# Patient Record
Sex: Male | Born: 1940 | Race: White | Hispanic: No | Marital: Married | Smoking: Former smoker
Health system: Southern US, Community
[De-identification: ages and names within clinical notes are randomized; demographics above are authoritative.]

## PROBLEM LIST (undated history)

## (undated) DIAGNOSIS — E785 Hyperlipidemia, unspecified: Secondary | ICD-10-CM

## (undated) DIAGNOSIS — K56600 Partial intestinal obstruction, unspecified as to cause: Secondary | ICD-10-CM

## (undated) DIAGNOSIS — U071 COVID-19: Secondary | ICD-10-CM

## (undated) DIAGNOSIS — M1990 Unspecified osteoarthritis, unspecified site: Secondary | ICD-10-CM

## (undated) DIAGNOSIS — I2510 Atherosclerotic heart disease of native coronary artery without angina pectoris: Secondary | ICD-10-CM

## (undated) DIAGNOSIS — I4891 Unspecified atrial fibrillation: Secondary | ICD-10-CM

## (undated) DIAGNOSIS — M545 Low back pain, unspecified: Secondary | ICD-10-CM

## (undated) DIAGNOSIS — M1A9XX0 Chronic gout, unspecified, without tophus (tophi): Secondary | ICD-10-CM

## (undated) DIAGNOSIS — K56609 Unspecified intestinal obstruction, unspecified as to partial versus complete obstruction: Secondary | ICD-10-CM

## (undated) DIAGNOSIS — I10 Essential (primary) hypertension: Secondary | ICD-10-CM

## (undated) DIAGNOSIS — N390 Urinary tract infection, site not specified: Secondary | ICD-10-CM

## (undated) DIAGNOSIS — K449 Diaphragmatic hernia without obstruction or gangrene: Secondary | ICD-10-CM

## (undated) DIAGNOSIS — I441 Atrioventricular block, second degree: Secondary | ICD-10-CM

## (undated) DIAGNOSIS — I4510 Unspecified right bundle-branch block: Secondary | ICD-10-CM

## (undated) HISTORY — DX: Unspecified right bundle-branch block: I45.10

## (undated) HISTORY — DX: Unspecified atrial fibrillation: I48.91

## (undated) HISTORY — DX: Unspecified osteoarthritis, unspecified site: M19.90

## (undated) HISTORY — DX: Chronic gout, unspecified, without tophus (tophi): M1A.9XX0

## (undated) HISTORY — DX: Low back pain, unspecified: M54.50

## (undated) HISTORY — DX: Unspecified intestinal obstruction, unspecified as to partial versus complete obstruction: K56.609

## (undated) HISTORY — DX: Essential (primary) hypertension: I10

## (undated) HISTORY — DX: Atherosclerotic heart disease of native coronary artery without angina pectoris: I25.10

## (undated) HISTORY — DX: Diaphragmatic hernia without obstruction or gangrene: K44.9

## (undated) HISTORY — DX: Atrioventricular block, second degree: I44.1

## (undated) HISTORY — DX: Partial intestinal obstruction, unspecified as to cause: K56.600

## (undated) HISTORY — PX: ROTATOR CUFF REPAIR: SHX139

## (undated) HISTORY — DX: Low back pain: M54.5

## (undated) HISTORY — DX: Urinary tract infection, site not specified: N39.0

## (undated) HISTORY — PX: HERNIA REPAIR: SHX51

## (undated) HISTORY — PX: CARDIAC CATHETERIZATION: SHX172

## (undated) HISTORY — DX: Hyperlipidemia, unspecified: E78.5

## (undated) NOTE — *Deleted (*Deleted)
Pt appears to be displaying symptoms of withdrawal. Notified Dr. Rachael Darby who initiated CIWA protocol. Pt has a history of heavy alcohol. Spoke with pt son who also mentioned the pt takes 1mg  of xanax per day at home. Will notify

---

## 2005-11-01 ENCOUNTER — Encounter: Admission: RE | Admit: 2005-11-01 | Discharge: 2005-11-01 | Payer: Self-pay | Admitting: Sports Medicine

## 2007-12-24 ENCOUNTER — Ambulatory Visit: Payer: Self-pay | Admitting: Oncology

## 2007-12-26 DIAGNOSIS — I2510 Atherosclerotic heart disease of native coronary artery without angina pectoris: Secondary | ICD-10-CM | POA: Insufficient documentation

## 2007-12-26 HISTORY — DX: Atherosclerotic heart disease of native coronary artery without angina pectoris: I25.10

## 2008-01-08 ENCOUNTER — Ambulatory Visit (HOSPITAL_COMMUNITY): Admission: RE | Admit: 2008-01-08 | Discharge: 2008-01-08 | Payer: Self-pay | Admitting: Cardiology

## 2008-01-20 ENCOUNTER — Inpatient Hospital Stay (HOSPITAL_COMMUNITY): Admission: AD | Admit: 2008-01-20 | Discharge: 2008-01-21 | Payer: Self-pay | Admitting: Cardiology

## 2008-10-27 ENCOUNTER — Ambulatory Visit: Payer: Self-pay | Admitting: Oncology

## 2008-11-04 LAB — CBC WITH DIFFERENTIAL/PLATELET
BASO%: 0.4 % (ref 0.0–2.0)
Basophils Absolute: 0 10*3/uL (ref 0.0–0.1)
EOS%: 2.2 % (ref 0.0–7.0)
HGB: 13.8 g/dL (ref 13.0–17.1)
MCH: 34.5 pg — ABNORMAL HIGH (ref 27.2–33.4)
MCHC: 34.9 g/dL (ref 32.0–36.0)
RDW: 12.3 % (ref 11.0–14.6)
WBC: 7.8 10*3/uL (ref 4.0–10.3)
lymph#: 1.4 10*3/uL (ref 0.9–3.3)

## 2008-11-04 LAB — COMPREHENSIVE METABOLIC PANEL
BUN: 17 mg/dL (ref 6–23)
CO2: 27 mEq/L (ref 19–32)
Calcium: 9 mg/dL (ref 8.4–10.5)
Chloride: 106 mEq/L (ref 96–112)
Creatinine, Ser: 1.54 mg/dL — ABNORMAL HIGH (ref 0.40–1.50)
Glucose, Bld: 145 mg/dL — ABNORMAL HIGH (ref 70–99)
Potassium: 4 mEq/L (ref 3.5–5.3)
Sodium: 139 mEq/L (ref 135–145)
Total Bilirubin: 0.6 mg/dL (ref 0.3–1.2)

## 2008-11-10 LAB — HEMOCHROMATOSIS DNA-PCR(C282Y,H63D)

## 2008-11-10 LAB — FERRITIN: Ferritin: 246 ng/mL (ref 22–322)

## 2008-11-10 LAB — AFP TUMOR MARKER: AFP-Tumor Marker: 2.9 ng/mL (ref 0.0–8.0)

## 2008-11-10 LAB — IRON AND TIBC
%SAT: 15 % — ABNORMAL LOW (ref 20–55)
TIBC: 303 ug/dL (ref 215–435)

## 2010-02-15 ENCOUNTER — Encounter: Payer: Self-pay | Admitting: Emergency Medicine

## 2010-02-15 ENCOUNTER — Ambulatory Visit: Payer: Self-pay | Admitting: Diagnostic Radiology

## 2010-02-15 ENCOUNTER — Inpatient Hospital Stay (HOSPITAL_COMMUNITY): Admission: EM | Admit: 2010-02-15 | Discharge: 2010-02-18 | Payer: Self-pay | Admitting: Internal Medicine

## 2010-07-19 ENCOUNTER — Ambulatory Visit: Payer: Self-pay | Admitting: Cardiology

## 2010-09-17 ENCOUNTER — Encounter: Payer: Self-pay | Admitting: Cardiology

## 2010-11-12 LAB — URINALYSIS, ROUTINE W REFLEX MICROSCOPIC
Nitrite: NEGATIVE
Specific Gravity, Urine: 1.035 — ABNORMAL HIGH (ref 1.005–1.030)
Urobilinogen, UA: 2 mg/dL — ABNORMAL HIGH (ref 0.0–1.0)

## 2010-11-12 LAB — BASIC METABOLIC PANEL
BUN: 10 mg/dL (ref 6–23)
Calcium: 8.3 mg/dL — ABNORMAL LOW (ref 8.4–10.5)
Chloride: 102 mEq/L (ref 96–112)
Chloride: 107 mEq/L (ref 96–112)
Creatinine, Ser: 1.07 mg/dL (ref 0.4–1.5)
Creatinine, Ser: 1.29 mg/dL (ref 0.4–1.5)
GFR calc Af Amer: >60 mL/min (ref >60)
GFR calc non Af Amer: 55 mL/min — ABNORMAL LOW (ref >60)
GFR calc non Af Amer: >60 mL/min (ref >60)
Glucose, Bld: 145 mg/dL — ABNORMAL HIGH (ref 70–99)
Glucose, Bld: 83 mg/dL (ref 70–99)
Potassium: 3.9 mEq/L (ref 3.5–5.1)
Sodium: 138 mEq/L (ref 135–145)

## 2010-11-12 LAB — COMPREHENSIVE METABOLIC PANEL
AST: 31 U/L (ref 0–37)
Albumin: 3.6 g/dL (ref 3.5–5.2)
BUN: 13 mg/dL (ref 6–23)
BUN: 17 mg/dL (ref 6–23)
CO2: 27 mEq/L (ref 19–32)
Calcium: 10 mg/dL (ref 8.4–10.5)
Chloride: 101 mEq/L (ref 96–112)
Chloride: 104 mEq/L (ref 96–112)
Creatinine, Ser: 1.2 mg/dL (ref 0.4–1.5)
Creatinine, Ser: 1.22 mg/dL (ref 0.4–1.5)
GFR calc Af Amer: >60 mL/min (ref >60)
GFR calc Af Amer: >60 mL/min (ref >60)
Glucose, Bld: 101 mg/dL — ABNORMAL HIGH (ref 70–99)
Glucose, Bld: 121 mg/dL — ABNORMAL HIGH (ref 70–99)
Potassium: 3.6 mEq/L (ref 3.5–5.1)
Potassium: 4.4 mEq/L (ref 3.5–5.1)
Total Bilirubin: 1.2 mg/dL (ref 0.3–1.2)
Total Protein: 7.6 g/dL (ref 6.0–8.3)

## 2010-11-12 LAB — CBC
HCT: 35.5 % — ABNORMAL LOW (ref 39.0–52.0)
HCT: 43.5 % (ref 39.0–52.0)
Hemoglobin: 14.9 g/dL (ref 13.0–17.0)
MCH: 33.8 pg (ref 26.0–34.0)
MCHC: 34.1 g/dL (ref 30.0–36.0)
MCV: 99.1 fL (ref 78.0–100.0)
Platelets: 188 10*3/uL (ref 150–400)
RBC: 3.53 MIL/uL — ABNORMAL LOW (ref 4.22–5.81)
RBC: 3.76 MIL/uL — ABNORMAL LOW (ref 4.22–5.81)
RDW: 12.6 % (ref 11.5–15.5)
RDW: 13.3 % (ref 11.5–15.5)
RDW: 13.4 % (ref 11.5–15.5)
WBC: 5.8 10*3/uL (ref 4.0–10.5)
WBC: 9.9 10*3/uL (ref 4.0–10.5)

## 2010-11-12 LAB — LIPASE, BLOOD: Lipase: 135 U/L (ref 23–300)

## 2010-11-12 LAB — DIFFERENTIAL
Basophils Absolute: 0.1 10*3/uL (ref 0.0–0.1)
Basophils Relative: 1 % (ref 0–1)
Eosinophils Relative: 3 % (ref 0–5)
Lymphs Abs: 1.6 10*3/uL (ref 0.7–4.0)
Monocytes Relative: 11 % (ref 3–12)
Neutro Abs: 8.8 10*3/uL — ABNORMAL HIGH (ref 1.7–7.7)
Neutrophils Relative %: 73 % (ref 43–77)

## 2010-11-12 LAB — CARDIAC PANEL(CRET KIN+CKTOT+MB+TROPI)
Relative Index: INVALID (ref 0.0–2.5)
Troponin I: 0.01 ng/mL (ref 0.00–0.06)

## 2010-11-12 LAB — URINE MICROSCOPIC-ADD ON

## 2010-11-12 LAB — URINE CULTURE: Colony Count: 45000

## 2010-11-23 ENCOUNTER — Telehealth: Payer: Self-pay | Admitting: Cardiology

## 2010-11-23 NOTE — Telephone Encounter (Signed)
FYI... Patient wife called and asked about return appt.  cked and pt not due to come back until may 23,2012.  She sounded as if she needed a sooner appt.  Asked if patient is having any problems.  She said he has been getting very SOB and having some pain in his chest.  Offered them to come in and see Lawson Fiscal.  She said pt would want to see Dr. [place].  Advised I did not have an opening for awhile, but if urgent we could see patient with another doc or NP.  She said she would call back after she talked to patient.

## 2010-11-24 NOTE — Telephone Encounter (Signed)
Wife called yest asking about return app. Advised he is sch for May. States mr. Crill has been having some SOB and occ pain in chest. Offered app w/ Lawson Fiscal but insisted seeing Dr. [place]. Said she would talk w/ pt and call back. Tried calling this AM since not hearing back from her yest. LM

## 2010-11-29 ENCOUNTER — Telehealth: Payer: Self-pay | Admitting: *Deleted

## 2010-11-29 NOTE — Telephone Encounter (Signed)
Have left several messages for wife but has not returned call

## 2010-12-27 ENCOUNTER — Encounter: Payer: Self-pay | Admitting: Cardiology

## 2010-12-27 DIAGNOSIS — K56609 Unspecified intestinal obstruction, unspecified as to partial versus complete obstruction: Secondary | ICD-10-CM | POA: Insufficient documentation

## 2010-12-27 DIAGNOSIS — E785 Hyperlipidemia, unspecified: Secondary | ICD-10-CM | POA: Insufficient documentation

## 2010-12-27 DIAGNOSIS — M109 Gout, unspecified: Secondary | ICD-10-CM | POA: Insufficient documentation

## 2010-12-27 DIAGNOSIS — M545 Low back pain, unspecified: Secondary | ICD-10-CM | POA: Insufficient documentation

## 2010-12-27 DIAGNOSIS — M1990 Unspecified osteoarthritis, unspecified site: Secondary | ICD-10-CM | POA: Insufficient documentation

## 2010-12-27 DIAGNOSIS — I10 Essential (primary) hypertension: Secondary | ICD-10-CM | POA: Insufficient documentation

## 2011-01-01 ENCOUNTER — Other Ambulatory Visit: Payer: Self-pay | Admitting: *Deleted

## 2011-01-03 ENCOUNTER — Ambulatory Visit (INDEPENDENT_AMBULATORY_CARE_PROVIDER_SITE_OTHER): Payer: Medicare Other | Admitting: Cardiology

## 2011-01-03 ENCOUNTER — Encounter: Payer: Self-pay | Admitting: Cardiology

## 2011-01-03 ENCOUNTER — Other Ambulatory Visit (INDEPENDENT_AMBULATORY_CARE_PROVIDER_SITE_OTHER): Payer: Medicare Other | Admitting: *Deleted

## 2011-01-03 VITALS — BP 126/90 | HR 62 | Ht 71.0 in | Wt 232.0 lb

## 2011-01-03 DIAGNOSIS — M109 Gout, unspecified: Secondary | ICD-10-CM

## 2011-01-03 DIAGNOSIS — I4510 Unspecified right bundle-branch block: Secondary | ICD-10-CM

## 2011-01-03 DIAGNOSIS — I2510 Atherosclerotic heart disease of native coronary artery without angina pectoris: Secondary | ICD-10-CM

## 2011-01-03 DIAGNOSIS — I10 Essential (primary) hypertension: Secondary | ICD-10-CM

## 2011-01-03 DIAGNOSIS — E785 Hyperlipidemia, unspecified: Secondary | ICD-10-CM

## 2011-01-03 MED ORDER — INDOMETHACIN ER 75 MG PO CPCR: 75.0000 mg | ORAL_CAPSULE | Freq: Two times a day (BID) | ORAL | Status: AC

## 2011-01-03 NOTE — Assessment & Plan Note (Signed)
This was noted at the time of his stress test in June of 2011. He is asymptomatic.

## 2011-01-03 NOTE — Assessment & Plan Note (Signed)
I recommended long-term dual antiplatelet therapy given the extensive stenting of his LAD with first generation drug-coated stents. I recommended weight loss. Continue risk factor modification. His last nuclear stress test in June 2011 showed a very small infarct and apical wall. Otherwise perfusion was normal and his ejection fraction was normal.

## 2011-01-03 NOTE — Progress Notes (Signed)
   [name] Date of Birth: 10-20-1940   History of Present Illness: Mr. Rebstock is seen today for six-month followup. He has had no significant chest pain. One month ago he had a lot of chest congestion which he attributed to the pollen. He did have some chest tightness at that time. The symptoms have since cleared up. He still notes occasional shortness of breath. He has had no palpitations.  Current Outpatient Prescriptions on File Prior to Visit  Medication Sig Dispense Refill  . clopidogrel (PLAVIX) 75 MG tablet Take 75 mg by mouth daily.        . simvastatin (ZOCOR) 40 MG tablet Take 40 mg by mouth at bedtime.        . verapamil (CALAN) 120 MG tablet Take 120 mg by mouth daily.        Marland Kitchen DISCONTD: aspirin 325 MG tablet Take 325 mg by mouth daily.          No Known Allergies  Past Medical History  Diagnosis Date  . Coronary artery disease 12/2007    STATUS POST STENTING OF THE LEFT ANTERIOR DESCENDING ARTERY  . Hypertension   . Hyperlipidemia   . SBO (small bowel obstruction)     PARTIAL  . LBP (low back pain)   . Arthritis   . Gout     Past Surgical History  Procedure Date  . Hernia repair     INGUINAL    History  Smoking status  . Former Smoker  . Quit date: 08/27/1990  Smokeless tobacco  . Not on file    History  Alcohol Use No    Family History  Problem Relation Age of Onset  . Emphysema Mother 33  . Heart attack Father 43    Review of Systems: The review of systems is positive for seasonal allergies.  All other systems were reviewed and are negative.  Physical Exam: BP 126/90  Pulse 62  Ht 5\' 11"  (1.803 m)  Wt 232 lb (105.235 kg)  BMI 32.36 kg/m2 He is an overweight white male in no acute distress. He has no JVD or bruits. HEENT exam is unremarkable. Lungs are clear. Cardiac exam reveals a regular rate and rhythm without gallop or murmur. Abdomen is obese, soft, nontender. He has no edema. LABORATORY DATA: ECG today demonstrates a normal  sinus rhythm with a right bundle branch block.  Assessment / Plan:

## 2011-01-03 NOTE — Assessment & Plan Note (Signed)
Continue simvastatin therapy. We will check fasting lab work on his followup in 6 months.

## 2011-01-03 NOTE — Assessment & Plan Note (Signed)
Blood pressure control is acceptable. We will continue with his verapamil.

## 2011-01-03 NOTE — Patient Instructions (Signed)
Stay on the same medications.  We will check your fasting lab work in 6 months with your next office visit.  Drink less than 2 alcoholic beverages per day. 1-1.5 ounces.

## 2011-01-09 NOTE — Discharge Summary (Signed)
Gregory Wheeler, Gregory Wheeler NO.:  [id number]   MEDICAL RECORD NO.:  [id number]          PATIENT TYPE:  INP   LOCATION:  6527                         FACILITY:  [facility]   PHYSICIAN:  Peter M. Swaziland, M.D.  DATE OF BIRTH:  [birth year]   DATE OF ADMISSION:  01/20/2008  DATE OF DISCHARGE:  01/21/2008                               DISCHARGE SUMMARY   INDICATION:  Admission.   HISTORY OF PRESENT ILLNESS:  Gregory Wheeler is a 70 year old white male who  presented with symptoms of dyspnea.  He had some vague atypical chest  pain.  He had a history of hypertension and hypercholesterolemia.  He  subsequently underwent a stress Cardiolite study.  The patient's study  was equivocal.  He was able to complete stage II of the Bruce protocol  without significant chest pain or ECG changes.  His Cardiolite images  demonstrated a fixed apical defect with an ejection fraction normal at  65%.  The patient subsequently underwent a CT angiography, which  demonstrated severe noncalcific plaque in the mid LAD consistent with  moderate-to-severe stenosis.  Based on these findings, the patient was  admitted for cardiac catheterization, possible intervention.   For details of his past medical history, social history, family history,  and physical exam please see admission history and physical.   HOSPITAL COURSE:  The patient was brought to the cardiac catheterization  laboratory.  Coronary angiography demonstrated severe complex stenosis  in the mid LAD.  There is an 80-90% stenosis at the takeoff of small  second and third diagonal branches as well as first septal perforator.  This was followed in the next segment of the mid LAD with a focal 80-90%  stenosis.  The left circumflex coronary and the right coronary without  significant disease.  Left ventricular function was again normal with  65% ejection fraction.  We proceeded at this point with an intracoronary  intervention of the mid LAD.  We used  two sequential stents to cover  this entire lesion.  The more distal portion of the lesion was covered  with a 2.75 x 16-mm Taxus Liberte stent.  The more proximal portion of  the lesion was covered with a 3.0 x 16-mm Taxus Liberte stent.  The more  proximal portion was postdilated up to 3.25 mm with a noncompliant  balloon.  The patient achieved an excellent angiographic result,  0%  residual stenosis, and TIMI grade 3 flow.  There was no significant  compromise of the diagonal or septal perforator side branches.  The  patient had no angina.  He received Angiomax during the procedure was  discontinued and the procedure.  He was maintained on aspirin and Plavix  and subsequent ECG was normal.  Cardiac enzymes were normal.  Following  morning, his CBC and BMET were normal.  Creatinine of 1.3 and good urine  output.  Vital signs remained stable.  He was discharged home the  following day in stable condition.   DISCHARGE DIAGNOSES:  1. Severe single-vessel obstructive coronary disease.  2. Successful intracoronary stenting of the mid left anterior  descending.  3. Hypertension.  4. Hyperlipidemia.   DISCHARGE MEDICATIONS:  1. Coated aspirin 325 mg per day.  2. Plavix 75 mg per day.  3. Zocor 40 mg per day.  4. Verapamil 120 mg per day.  5. Voltaren 75 mg daily p.r.n.  6. Nitroglycerin p.r.n.   The patient is to avoid heavy lifting or straining for 5 days.  He will  remain on a heart-healthy diet and will follow up with Dr. Swaziland in  approximately 1 week.  Discharge status is improved.           ______________________________  Peter M. Sw[place]iland, M.D.     PMJ/MEDQ  D:  01/21/2008  T:  01/21/2008  Job:  [id number]   cc:   Evelena Peat, M.D.

## 2011-01-09 NOTE — H&P (Signed)
NAMEJERALD, Wheeler NO.:  [id number]   MEDICAL RECORD NO.:  [id number]           PATIENT TYPE:   LOCATION:                                 FACILITY:   PHYSICIAN:  Peter M. [place]aziland, M.D.  DATE OF BIRTH:  26-May-1941   DATE OF ADMISSION:  DATE OF DISCHARGE:                              HISTORY & PHYSICAL   HISTORY OF PRESENT ILLNESS:  [name] is a pleasant 70 year old white  male who is seen for evaluation on dyspnea.  The patient had complained  of shortness of breath, mostly with exertion.  He has had some burning,  left upper chest pain as well.  These symptoms have actually been  present for a couple of years but seemed to have progressed more  recently.  He has had a mild cough as well.  The patient has risk  factors of hypertension and hypercholesterolemia.  He also has a family  history of early coronary disease.  To evaluate his symptoms, he  underwent a stress Cardiolite study on Dec 26, 2007.  The patient was  able to walk for 5 minutes and 40 seconds without chest pain or ECG  changes.  His Cardiolite images demonstrated a fixed apical defect.  His  ejection fraction is normal at 65%.  This was felt to be an equivocal  study; and for this reason, he underwent a cardiac CT angiography on Jan 08, 2008.  This demonstrated scattered nonobstructive calcific plaque.  However, in the mid LAD, there was a noncalcific plaque that was  moderate to severe with what was felt to be at least moderate  obstructive disease.  Again, his ejection fraction was normal.  His  calcium score was 261, which placed him in the 50-75th percentile for  his matched age and sex.  There was no aortic pathology and no pulmonary  pathology.  He was noted to have a moderate-sized hiatal hernia.  Based  on these findings, it is recommended that he undergo a coronary  angiography for definitive evaluation of his mid LAD stenosis; and if  this is moderately severe, then consider for  stenting.   PAST MEDICAL HISTORY:  1. Hypertension.  2. Hypercholesterolemia.  3. Arthritis.  4. He has had previous inguinal hernia repair at age 57.  5. He has previously been hospitalized for gout.   ALLERGIES:  He has no known allergies.   CURRENT MEDICATIONS:  Include:  1. Verapamil 120 mg per day.  2. Simvastatin 40 mg per day.  3. Diclofenac 75 mg per day.  4. Aspirin daily.   SOCIAL HISTORY:  The patient is married.  He has 3 children in good  health.  He is self-employed, used [employer].  He denies tobacco use.  He does drink 3-4 alcoholic beverages per day.   FAMILY HISTORY:  Father died at age 36 with a myocardial infarction.  He  had had his first heart attack at age 82.  Mother died at age 16 with  emphysema.  One brother age 41 and six sisters are all in good health.  REVIEW OF SYSTEMS:  As noted in the HPI, otherwise unremarkable.   PHYSICAL EXAMINATION:  GENERAL:  The patient is a pleasant white male in  no apparent distress.  His weight is 232, blood pressure is 128/80,  pulse 68 and regular.  HEENT:  Normocephalic and atraumatic.  His pupils are equal, round, and  reactive to light and accommodation.  Extraocular movements are full.  Oropharynx is clear.  NECK:  Supple without JVD, adenopathy, thyromegaly, or bruits.  LUNGS:  Clear to auscultation and percussion.  CARDIAC:  Reveals a regular rate and rhythm.  Normal S1 and S2 without  gallop, murmur, rub, or click.  ABDOMEN:  Soft and nontender.  He has no masses or hepatosplenomegaly.  He is obese.  EXTREMITIES:  Femoral pedal pulses 2+ and symmetric.  He has no edema.  NEUROLOGIC:  Nonfocal.   LABORATORY DATA:  His chest x-ray shows no active disease.  His resting  ECG shows normal sinus rhythm with right bundle branch block pattern.   IMPRESSION:  1. Symptoms of dyspnea on exertion and atypical chest pain.  Stress      Cardiolite study was equivocal showing a fixed apical defect.  2. Cardiac CT  angiography demonstrates calcific and noncalcific plaque      with moderate-to-severe obstruction in the mid left anterior      descending.  3. Hypertension.  4. Hypercholesterolemia.  5. Alcohol use.   PLAN:  We will proceed with diagnostic cardiac catheterization.  This  confirms significant stenosis in the mid LAD.  We will proceed with  stenting.           ______________________________  Peter M. [place]iland, M.D.     PMJ/MEDQ  D:  01/12/2008  T:  01/13/2008  Job:  [id number]   cc:   Evelena Peat, M.D.

## 2011-01-25 ENCOUNTER — Other Ambulatory Visit: Payer: Self-pay | Admitting: Cardiology

## 2011-01-25 NOTE — Telephone Encounter (Signed)
escribe medication per fax request  

## 2011-02-05 ENCOUNTER — Other Ambulatory Visit: Payer: Self-pay | Admitting: Cardiology

## 2011-02-05 NOTE — Telephone Encounter (Signed)
Med refill

## 2011-05-23 LAB — CBC
HCT: 41.1
MCHC: 34
Platelets: 229
RDW: 12

## 2011-05-23 LAB — CK TOTAL AND CKMB (NOT AT ARMC): CK, MB: 1.3

## 2011-05-23 LAB — BASIC METABOLIC PANEL
BUN: 13
CO2: 26
Calcium: 9.6
Glucose, Bld: 87
Potassium: 4.5
Sodium: 141

## 2011-07-03 ENCOUNTER — Other Ambulatory Visit: Payer: Self-pay | Admitting: Cardiology

## 2011-07-16 ENCOUNTER — Ambulatory Visit (INDEPENDENT_AMBULATORY_CARE_PROVIDER_SITE_OTHER): Payer: Medicare Other | Admitting: Cardiology

## 2011-07-16 ENCOUNTER — Encounter: Payer: Self-pay | Admitting: Cardiology

## 2011-07-16 VITALS — BP 149/89 | HR 79 | Ht 71.0 in | Wt 233.0 lb

## 2011-07-16 DIAGNOSIS — I10 Essential (primary) hypertension: Secondary | ICD-10-CM

## 2011-07-16 DIAGNOSIS — E785 Hyperlipidemia, unspecified: Secondary | ICD-10-CM

## 2011-07-16 DIAGNOSIS — I2510 Atherosclerotic heart disease of native coronary artery without angina pectoris: Secondary | ICD-10-CM

## 2011-07-16 DIAGNOSIS — Z9911 Dependence on respirator [ventilator] status: Secondary | ICD-10-CM

## 2011-07-16 LAB — HEPATIC FUNCTION PANEL
ALT: 17 U/L (ref 0–53)
AST: 17 U/L (ref 0–37)
Alkaline Phosphatase: 59 U/L (ref 39–117)
Bilirubin, Direct: 0.1 mg/dL (ref 0.0–0.3)
Total Bilirubin: 0.8 mg/dL (ref 0.3–1.2)

## 2011-07-16 LAB — BASIC METABOLIC PANEL
BUN: 13 mg/dL (ref 6–23)
Calcium: 9.6 mg/dL (ref 8.4–10.5)
Creatinine, Ser: 1.3 mg/dL (ref 0.4–1.5)
GFR: 60.19 mL/min (ref >60.00)
Glucose, Bld: 92 mg/dL (ref 70–99)
Sodium: 140 mEq/L (ref 135–145)

## 2011-07-16 LAB — LIPID PANEL: HDL: 46.6 mg/dL (ref >39.00)

## 2011-07-16 NOTE — Assessment & Plan Note (Signed)
He remains asymptomatic. We'll continue with risk factor modification. He will continue with dual antiplatelet therapy. His last stress test in 2011 was acceptable. I will followup again in 6 months.

## 2011-07-16 NOTE — Patient Instructions (Signed)
Continue your current medications.  Watch your blood pressure at home.  We will check blood work today and call with the results.  I will see you again in 6 months.

## 2011-07-16 NOTE — Assessment & Plan Note (Signed)
Blood pressure is mildly elevated today but his previous visits and blood pressure readings at home have been good. We will ask him to continue to monitor.

## 2011-07-16 NOTE — Assessment & Plan Note (Signed)
We will followup on fasting lab work today. Encouraged him to lose weight.

## 2011-07-16 NOTE — Progress Notes (Signed)
   [name] Date of Birth: September 05, 1940   History of Present Illness: [name] is seen today for six-month followup. He has had no significant chest pain.  He still notes occasional shortness of breath. He has had no palpitations. Blood pressure readings at home have been normal.   Current Outpatient Prescriptions on File Prior to Visit  Medication Sig Dispense Refill  . aspirin 81 MG tablet Take 81 mg by mouth daily.        Marland Kitchen PLAVIX 75 MG tablet TAKE ONE TABLET BY MOUTH EVERY DAY  30 each  5  . simvastatin (ZOCOR) 40 MG tablet Take 40 mg by mouth at bedtime.        . verapamil (CALAN) 120 MG tablet TAKE ONE TABLET BY MOUTH EVERY DAY  30 tablet  4    No Known Allergies  Past Medical History  Diagnosis Date  . Coronary artery disease 12/2007    STATUS POST STENTING OF THE LEFT ANTERIOR DESCENDING ARTERY  . Hypertension   . Hyperlipidemia   . SBO (small bowel obstruction)     PARTIAL  . LBP (low back pain)   . Arthritis   . Gout   . RBBB (right bundle branch block)   . Partial small bowel obstruction   . Hiatal hernia   . Chronic gout   . Urinary tract infection     Escherichia coli urinary tract infection  . Dyslipidemia   . Abdominal pain   . Nausea   . Vomiting     Past Surgical History  Procedure Date  . Hernia repair     INGUINAL  . Rotator cuff repair     History  Smoking status  . Former Smoker  . Quit date: 08/27/1990  Smokeless tobacco  . Not on file    History  Alcohol Use No    Family History  Problem Relation Age of Onset  . Emphysema Mother 28  . Heart attack Father 73    Review of Systems: As noted in HPI All other systems were reviewed and are negative.  Physical Exam: BP 149/89  Pulse 79  Ht 5\' 11"  (1.803 m)  Wt 233 lb (105.688 kg)  BMI 32.50 kg/m2 He is an overweight white male in no acute distress. He has no JVD or bruits. HEENT exam is unremarkable. Lungs are clear. Cardiac exam reveals a regular rate and rhythm without  gallop or murmur. Abdomen is obese, soft, nontender. He has no edema. LABORATORY DATA:  Assessment / Plan:

## 2011-08-04 ENCOUNTER — Other Ambulatory Visit: Payer: Self-pay | Admitting: Cardiology

## 2011-08-23 ENCOUNTER — Other Ambulatory Visit: Payer: Self-pay | Admitting: Cardiology

## 2011-09-05 ENCOUNTER — Telehealth: Payer: Self-pay | Admitting: Cardiology

## 2011-09-05 MED ORDER — NITROGLYCERIN 0.4 MG SL SUBL: 0.4000 mg | SUBLINGUAL_TABLET | SUBLINGUAL | Status: DC | PRN

## 2011-09-05 NOTE — Telephone Encounter (Signed)
Patient's wife called,stating patient has been having pain lf chest pain off and on since yesterday 09/04/11.States does not have any NTG.Spoke with [name] Fredrickson NP.She advised NTG prn and make appointment this week.Appointment scheduled with Lawson Fiscal 09/06/11 at 8:45 am.NTG prescription sent to [facility].

## 2011-09-05 NOTE — Telephone Encounter (Signed)
New problem:  Patient wife calling C/O pain under left breast. No nitro given.

## 2011-09-06 ENCOUNTER — Telehealth: Payer: Self-pay | Admitting: Cardiology

## 2011-09-06 ENCOUNTER — Ambulatory Visit: Payer: Medicare Other | Admitting: Nurse Practitioner

## 2011-09-06 NOTE — Telephone Encounter (Signed)
Patient's wife was called, she stated patient had to go out of town,that is why he cancelled appointment today.Advised if chest pain continues he needs to make appointment or go to ER.Wife stated patient has not had any chest pain today. She will call back if chest pain continues.

## 2011-09-06 NOTE — Telephone Encounter (Signed)
FU Call: pt wife returning call from [place]. Please return pt call to discuss further.

## 2011-12-07 ENCOUNTER — Other Ambulatory Visit: Payer: Self-pay | Admitting: Cardiology

## 2012-01-28 ENCOUNTER — Ambulatory Visit (INDEPENDENT_AMBULATORY_CARE_PROVIDER_SITE_OTHER): Payer: Medicare Other | Admitting: Cardiology

## 2012-01-28 ENCOUNTER — Encounter: Payer: Self-pay | Admitting: Cardiology

## 2012-01-28 VITALS — BP 148/88 | HR 77 | Ht 71.0 in | Wt 235.0 lb

## 2012-01-28 DIAGNOSIS — E785 Hyperlipidemia, unspecified: Secondary | ICD-10-CM

## 2012-01-28 DIAGNOSIS — I2510 Atherosclerotic heart disease of native coronary artery without angina pectoris: Secondary | ICD-10-CM

## 2012-01-28 DIAGNOSIS — I10 Essential (primary) hypertension: Secondary | ICD-10-CM

## 2012-01-28 NOTE — Progress Notes (Signed)
   Gregory Wheeler Date of Birth: 04/11/1941   History of Present Illness: Gregory Wheeler is seen today for a followup visit. He denies any significant chest pain or shortness of breath. He remains very active at home but doesn't really do any form or exercise. He denies any palpitations. He reports his blood pressure reading yesterday was 140/76. He does eat a lot of salt. In January he had a vague discomfort beneath his left breast that he attributed to eating a lot of peanuts. This has not recurred.   Current Outpatient Prescriptions on File Prior to Visit  Medication Sig Dispense Refill  . aspirin 81 MG tablet Take 81 mg by mouth daily.        . clopidogrel (PLAVIX) 75 MG tablet TAKE ONE TABLET BY MOUTH EVERY DAY  30 tablet  5  . nitroGLYCERIN (NITROSTAT) 0.4 MG SL tablet Place 1 tablet (0.4 mg total) under the tongue every 5 (five) minutes as needed for chest pain.  25 tablet  12  . simvastatin (ZOCOR) 40 MG tablet TAKE ONE TABLET BY MOUTH AT BEDTIME  30 tablet  6  . verapamil (CALAN) 120 MG tablet TAKE ONE TABLET BY MOUTH EVERY DAY.  30 tablet  5    No Known Allergies  Past Medical History  Diagnosis Date  . Coronary artery disease 12/2007    STATUS POST STENTING OF THE LEFT ANTERIOR DESCENDING ARTERY  . Hypertension   . Hyperlipidemia   . SBO (small bowel obstruction)     PARTIAL  . LBP (low back pain)   . Arthritis   . Gout   . RBBB (right bundle branch block)   . Partial small bowel obstruction   . Hiatal hernia   . Chronic gout   . Urinary tract infection     Escherichia coli urinary tract infection  . Dyslipidemia   . Abdominal pain   . Nausea   . Vomiting     Past Surgical History  Procedure Date  . Hernia repair     INGUINAL  . Rotator cuff repair     History  Smoking status  . Former Smoker  . Quit date: 08/27/1990  Smokeless tobacco  . Not on file    History  Alcohol Use No    Family History  Problem Relation Age of Onset  . Emphysema Mother 28   . Heart attack Father 45    Review of Systems: As noted in HPI All other systems were reviewed and are negative.  Physical Exam: BP 148/88  Pulse 77  Ht 5\' 11"  (1.803 m)  Wt 235 lb (106.595 kg)  BMI 32.78 kg/m2 He is an overweight white male in no acute distress. He has no JVD or bruits. HEENT exam is unremarkable. PERRLA, sclera are clear. Oropharynx is clear. Lungs are clear. Cardiac exam reveals a regular rate and rhythm without gallop or murmur. Abdomen is obese, soft, nontender. He has no edema. Pedal pulses are 2+ and symmetric. He is alert and oriented x3. Cranial nerves II through XII are intact. LABORATORY DATA: ECG shows normal sinus rhythm with a right bundle branch block. This is old. Assessment / Plan:

## 2012-01-28 NOTE — Patient Instructions (Signed)
Continue your current medication.  Reduce your salt intake and monitor your blood pressure.  I will see you again in 6 months with fasting lab.

## 2012-01-28 NOTE — Assessment & Plan Note (Signed)
Blood pressure is elevated today similar to what we got on his last visit. I told him he could eliminate sodium from his diet or we'll need to put him on additional medication. I asked that he monitor his blood pressure more closely at home.

## 2012-01-28 NOTE — Assessment & Plan Note (Signed)
He has had no significant anginal symptoms. His last nuclear stress test in June of 2011 showed a small apical infarct with normal ejection fraction of 80%. We will continue with his medical therapy and plan on followup again in 6 months. Unless he develops new symptoms we will plan on repeat stress testing in one year.

## 2012-01-28 NOTE — Assessment & Plan Note (Signed)
Laboratory data was reviewed from his prior visit. His lipid levels are excellent. We will continue with his current therapy.

## 2012-02-28 ENCOUNTER — Other Ambulatory Visit: Payer: Self-pay | Admitting: Cardiology

## 2012-03-11 ENCOUNTER — Other Ambulatory Visit: Payer: Self-pay | Admitting: Cardiology

## 2012-05-09 ENCOUNTER — Telehealth: Payer: Self-pay | Admitting: Cardiology

## 2012-05-09 NOTE — Telephone Encounter (Signed)
Spoke to patient's wife was told Dr.Jordan advised to hold zocor and call back in 2 weeks to report condtion,if legs are better he will prescribe another statin.

## 2012-05-09 NOTE — Telephone Encounter (Signed)
Agree with holding Zocor. If pain improves significantly then we could try him on low dose of crestor or lipitor. Have him check back with [place] in a couple of weeks. Katerin Negrete [place] MD, [facility]

## 2012-05-09 NOTE — Telephone Encounter (Signed)
New Problem:    Patient's wife called in because her husband's legs are bothering him and she believes that it is due to his simvastatin (ZOCOR) 40 MG tablet.  Patient would like to know if there were any other alteratives for this medication.  Please call back.

## 2012-05-09 NOTE — Telephone Encounter (Signed)
Patient called spoke to wife she stated patient has been having pain in both legs for over a month,wanting to know if zocor was causing.Advised to hold zocor and will let Dr.Jordan know.Message sent to Dr.Jordan for advice.

## 2012-06-10 ENCOUNTER — Other Ambulatory Visit: Payer: Self-pay | Admitting: Cardiology

## 2012-08-06 ENCOUNTER — Ambulatory Visit (INDEPENDENT_AMBULATORY_CARE_PROVIDER_SITE_OTHER): Payer: Medicare Other | Admitting: Cardiology

## 2012-08-06 ENCOUNTER — Encounter: Payer: Self-pay | Admitting: Cardiology

## 2012-08-06 VITALS — BP 152/90 | HR 75 | Ht 71.0 in | Wt 236.8 lb

## 2012-08-06 DIAGNOSIS — I4510 Unspecified right bundle-branch block: Secondary | ICD-10-CM

## 2012-08-06 DIAGNOSIS — E785 Hyperlipidemia, unspecified: Secondary | ICD-10-CM | POA: Diagnosis not present

## 2012-08-06 DIAGNOSIS — I2510 Atherosclerotic heart disease of native coronary artery without angina pectoris: Secondary | ICD-10-CM

## 2012-08-06 DIAGNOSIS — I10 Essential (primary) hypertension: Secondary | ICD-10-CM | POA: Diagnosis not present

## 2012-08-06 LAB — HEPATIC FUNCTION PANEL
ALT: 21 U/L (ref 0–53)
AST: 20 U/L (ref 0–37)
Bilirubin, Direct: 0.1 mg/dL (ref 0.0–0.3)
Total Bilirubin: 1.1 mg/dL (ref 0.3–1.2)

## 2012-08-06 LAB — LIPID PANEL: Total CHOL/HDL Ratio: 4

## 2012-08-06 LAB — BASIC METABOLIC PANEL
BUN: 12 mg/dL (ref 6–23)
CO2: 29 mEq/L (ref 19–32)
Calcium: 9.5 mg/dL (ref 8.4–10.5)
GFR: 68.74 mL/min (ref >60.00)
Glucose, Bld: 86 mg/dL (ref 70–99)

## 2012-08-06 MED ORDER — INDOMETHACIN 50 MG PO CAPS: 50.0000 mg | ORAL_CAPSULE | Freq: Two times a day (BID) | ORAL | Status: DC

## 2012-08-06 MED ORDER — VERAPAMIL HCL ER 240 MG PO TBCR: 240.0000 mg | EXTENDED_RELEASE_TABLET | Freq: Every day | ORAL | Status: DC

## 2012-08-06 NOTE — Patient Instructions (Signed)
Increase verapamil (Calan) to 240 mg daily  I sent a Rx for indocin to use prn gout.  We will check lab work today and call with the results.  I will see you in 6 months.

## 2012-08-06 NOTE — Progress Notes (Signed)
   Gregory Wheeler Date of Birth: 04/17/1941   History of Present Illness: Gregory Wheeler is seen today for a followup visit. He denies any significant chest pain or shortness of breath. He states he has been doing better with his salt intake. Blood pressure readings at home have been variable. He denies any dizziness.  Current Outpatient Prescriptions on File Prior to Visit  Medication Sig Dispense Refill  . allopurinol (ZYLOPRIM) 100 MG tablet Take 100 mg by mouth as directed.      Marland Kitchen aspirin 81 MG tablet Take 81 mg by mouth daily.        . clopidogrel (PLAVIX) 75 MG tablet TAKE ONE TABLET BY MOUTH EVERY DAY  30 tablet  5  . simvastatin (ZOCOR) 40 MG tablet TAKE ONE TABLET BY MOUTH AT BEDTIME  30 tablet  5  . [DISCONTINUED] verapamil (CALAN) 120 MG tablet TAKE ONE TABLET BY MOUTH EVERY DAY  30 tablet  4  . nitroGLYCERIN (NITROSTAT) 0.4 MG SL tablet Place 1 tablet (0.4 mg total) under the tongue every 5 (five) minutes as needed for chest pain.  25 tablet  12    No Known Allergies  Past Medical History  Diagnosis Date  . Coronary artery disease 12/2007    STATUS POST STENTING OF THE LEFT ANTERIOR DESCENDING ARTERY  . Hypertension   . Hyperlipidemia   . SBO (small bowel obstruction)     PARTIAL  . LBP (low back pain)   . Arthritis   . Gout   . RBBB (right bundle branch block)   . Partial small bowel obstruction   . Hiatal hernia   . Chronic gout   . Urinary tract infection     Escherichia coli urinary tract infection  . Dyslipidemia     Past Surgical History  Procedure Date  . Hernia repair     INGUINAL  . Rotator cuff repair     History  Smoking status  . Former Smoker  . Quit date: 08/27/1990  Smokeless tobacco  . Not on file    History  Alcohol Use No    Family History  Problem Relation Age of Onset  . Emphysema Mother 22  . Heart attack Father 25    Review of Systems: As noted in HPI All other systems were reviewed and are negative.  Physical Exam: BP  152/90  Pulse 75  Ht 5\' 11"  (1.803 m)  Wt 236 lb 12.8 oz (107.412 kg)  BMI 33.03 kg/m2  SpO2 97% He is an overweight white male in no acute distress. He has no JVD or bruits. HEENT exam is unremarkable. PERRLA, sclera are clear. Oropharynx is clear. Lungs are clear. Cardiac exam reveals a regular rate and rhythm without gallop or murmur. Abdomen is obese, soft, nontender. He has no edema. Pedal pulses are 2+ and symmetric. He is alert and oriented x3. Cranial nerves II through XII are intact. LABORATORY DATA:  Assessment / Plan: 1. Coronary disease status post stenting of the LAD in 2009. Nuclear stress test in June of 2011 showed a small apical infarct with normal ejection fraction. Continue with risk factor modification. Continue aspirin and Plavix.  2. Hypertension. Still not adequately controlled. We will increase his verapamil to 240 mg daily.  3. Hyperlipidemia under good control on simvastatin.

## 2012-08-07 MED ORDER — VERAPAMIL HCL ER 240 MG PO TBCR: 240.0000 mg | EXTENDED_RELEASE_TABLET | Freq: Every day | ORAL | Status: DC

## 2012-08-07 NOTE — Progress Notes (Signed)
Calan 120 mg daily # 30 refills x 6 phoned in to [facility].

## 2012-08-07 NOTE — Progress Notes (Signed)
Indomethacin 50mg  prescription phoned in to [facility]

## 2012-08-07 NOTE — Addendum Note (Signed)
Addended by: Meda Klinefelter D on: 08/07/2012 12:00 PM   Modules accepted: Orders

## 2012-08-14 ENCOUNTER — Telehealth: Payer: Self-pay | Admitting: *Deleted

## 2012-08-14 ENCOUNTER — Telehealth: Payer: Self-pay | Admitting: Cardiology

## 2012-08-14 MED ORDER — INDOMETHACIN 50 MG PO CAPS: 50.0000 mg | ORAL_CAPSULE | Freq: Two times a day (BID) | ORAL | Status: DC

## 2012-08-14 NOTE — Telephone Encounter (Signed)
Plz return call to pt wife at hm#[phone]  regarding medication issues

## 2012-08-14 NOTE — Telephone Encounter (Signed)
Called the pt's [facility] spoke with the Pharmacist Alease  she stated it was an error and if the pt hadn't started taking the verapamil (CALAN SR) 120 MG CR tablet they could exchange the Rx.  Caralee Ates, CMA   Called the pt wife to inform her what the pharmacist said about the exchange, she stated he had taken a couple of the pills but was going to take the rest back to the pharmacy to see if she could still exchange them.  Caralee Ates, CMA

## 2012-08-14 NOTE — Telephone Encounter (Signed)
Spoke to patient's wife she stated she went to pick up indocin Dr.Jordan prescribed husband and it cost too much.States would like generic indocin.Generic indocin 50 mg phoned to [facility].

## 2012-08-14 NOTE — Telephone Encounter (Signed)
Pt's wife called stated her husbands seen Dr. Swaziland on 08/06/2012 and his verapamil (CALAN SR) 120 MG CR tablet was increased verapamil (CALAN SR) 240 MG CR tablet.  She went to [facility] on [facility] [place], [place] to pick up his new Rx for verapamil (CALAN SR) 240 MG CR tablet, but instead received verapamil (CALAN SR) 120 MG CR tablet.  Last refill shows verapamil (CALAN SR) 240 MG CR tablet was sent into [facility] on [address] [place], [place].  Will call [facility] on [facility] [place], [place], to get clarification on mistake will call pt's wife back @ [phone] after speaking with [facility].  Caralee Ates, CMA

## 2012-09-01 ENCOUNTER — Other Ambulatory Visit: Payer: Self-pay

## 2012-09-01 MED ORDER — CLOPIDOGREL BISULFATE 75 MG PO TABS: 75.0000 mg | ORAL_TABLET | Freq: Every day | ORAL | Status: DC

## 2012-10-28 ENCOUNTER — Other Ambulatory Visit: Payer: Self-pay | Admitting: Cardiology

## 2013-02-11 ENCOUNTER — Encounter: Payer: Self-pay | Admitting: Cardiology

## 2013-02-11 ENCOUNTER — Other Ambulatory Visit: Payer: Self-pay

## 2013-02-11 ENCOUNTER — Ambulatory Visit (INDEPENDENT_AMBULATORY_CARE_PROVIDER_SITE_OTHER): Payer: Medicare Other | Admitting: Cardiology

## 2013-02-11 VITALS — BP 146/84 | HR 58 | Ht 71.0 in | Wt 235.2 lb

## 2013-02-11 DIAGNOSIS — I2510 Atherosclerotic heart disease of native coronary artery without angina pectoris: Secondary | ICD-10-CM | POA: Diagnosis not present

## 2013-02-11 DIAGNOSIS — I10 Essential (primary) hypertension: Secondary | ICD-10-CM

## 2013-02-11 DIAGNOSIS — E785 Hyperlipidemia, unspecified: Secondary | ICD-10-CM

## 2013-02-11 DIAGNOSIS — I4510 Unspecified right bundle-branch block: Secondary | ICD-10-CM

## 2013-02-11 NOTE — Progress Notes (Signed)
Gregory Wheeler Date of Birth: 08-10-1941   History of Present Illness: Gregory Wheeler is seen today for a followup visit. He has a history of coronary disease and is status post stenting of the LAD in 2009. He denies any significant chest pain. He does have some mild shortness of breath and swelling. He still likes salt including salting his cucumbers and cantaloupe. His blood pressure has been very good at home since we increased his verapamil dose.  Current Outpatient Prescriptions on File Prior to Visit  Medication Sig Dispense Refill  . allopurinol (ZYLOPRIM) 100 MG tablet Take 100 mg by mouth as directed.      Marland Kitchen aspirin 81 MG tablet Take 81 mg by mouth daily.        . clopidogrel (PLAVIX) 75 MG tablet Take 1 tablet (75 mg total) by mouth daily.  30 tablet  5  . indomethacin (INDOCIN) 50 MG capsule Take 1 capsule (50 mg total) by mouth 2 (two) times daily with a meal.  60 capsule  3  . nitroGLYCERIN (NITROSTAT) 0.4 MG SL tablet Place 1 tablet (0.4 mg total) under the tongue every 5 (five) minutes as needed for chest pain.  25 tablet  12  . simvastatin (ZOCOR) 40 MG tablet TAKE ONE TABLET BY MOUTH AT BEDTIME.  30 tablet  5  . verapamil (CALAN SR) 240 MG CR tablet Take 1 tablet (240 mg total) by mouth at bedtime.  30 tablet  6   No current facility-administered medications on file prior to visit.    No Known Allergies  Past Medical History  Diagnosis Date  . Coronary artery disease 12/2007    STATUS POST STENTING OF THE LEFT ANTERIOR DESCENDING ARTERY  . Hypertension   . Hyperlipidemia   . SBO (small bowel obstruction)     PARTIAL  . LBP (low back pain)   . Arthritis   . Gout   . RBBB (right bundle branch block)   . Partial small bowel obstruction   . Hiatal hernia   . Chronic gout   . Urinary tract infection     Escherichia coli urinary tract infection  . Dyslipidemia     Past Surgical History  Procedure Laterality Date  . Hernia repair      INGUINAL  . Rotator cuff  repair      History  Smoking status  . Former Smoker  . Quit date: 08/27/1990  Smokeless tobacco  . Not on file    History  Alcohol Use No    Family History  Problem Relation Age of Onset  . Emphysema Mother 41  . Heart attack Father 67    Review of Systems: As noted in HPI All other systems were reviewed and are negative.  Physical Exam: BP 146/84  Pulse 58  Ht 5\' 11"  (1.803 m)  Wt 235 lb 3.2 oz (106.686 kg)  BMI 32.82 kg/m2 He is an overweight white male in no acute distress. He has no JVD or bruits. HEENT exam is unremarkable. PERRLA, sclera are clear. Oropharynx is clear. Lungs are clear. Cardiac exam reveals a regular rate and rhythm without gallop or murmur. Abdomen is obese, soft, nontender. He has trace to 1+ edema. Pedal pulses are 2+ and symmetric. He is alert and oriented x3. Cranial nerves II through XII are intact. LABORATORY DATA: Lab Results  Component Value Date   WBC 5.8 02/18/2010   HGB 12.1* 02/18/2010   HCT 35.5* 02/18/2010   PLT 188 02/18/2010  GLUCOSE 86 08/06/2012   CHOL 127 08/06/2012   TRIG 126.0 08/06/2012   HDL 34.60* 08/06/2012   LDLCALC 67 08/06/2012   ALT 21 08/06/2012   AST 20 08/06/2012   NA 140 08/06/2012   K 4.3 08/06/2012   CL 102 08/06/2012   CREATININE 1.1 08/06/2012   BUN 12 08/06/2012   CO2 29 08/06/2012   ECG today demonstrates sinus bradycardia with first degree AV block. Rate is 58 beats per minute. He has a right bundle branch block that is chronic.  Assessment / Plan: 1. Coronary disease status post stenting of the LAD in 2009. Nuclear stress test in June of 2011 showed a small apical infarct with normal ejection fraction. Continue with risk factor modification. Continue aspirin and Plavix. Patient remains asymptomatic.  2. Hypertension. Improved control. I have urged sodium restriction.  3. Hyperlipidemia under good control on simvastatin.

## 2013-02-11 NOTE — Patient Instructions (Signed)
Limit your salt intake.  Continue your current therapy  I will see you in 6 months with lab work

## 2013-02-23 DIAGNOSIS — J209 Acute bronchitis, unspecified: Secondary | ICD-10-CM | POA: Diagnosis not present

## 2013-03-10 ENCOUNTER — Other Ambulatory Visit: Payer: Self-pay | Admitting: *Deleted

## 2013-03-10 MED ORDER — CLOPIDOGREL BISULFATE 75 MG PO TABS: 75.0000 mg | ORAL_TABLET | Freq: Every day | ORAL | Status: DC

## 2013-04-07 ENCOUNTER — Other Ambulatory Visit: Payer: Self-pay | Admitting: *Deleted

## 2013-04-07 ENCOUNTER — Encounter: Payer: Self-pay | Admitting: Cardiology

## 2013-04-07 MED ORDER — VERAPAMIL HCL ER 240 MG PO TBCR: 240.0000 mg | EXTENDED_RELEASE_TABLET | Freq: Every day | ORAL | Status: DC

## 2013-05-06 ENCOUNTER — Other Ambulatory Visit: Payer: Self-pay

## 2013-05-06 MED ORDER — SIMVASTATIN 40 MG PO TABS: ORAL_TABLET | ORAL | Status: DC

## 2013-07-06 DIAGNOSIS — M2010 Hallux valgus (acquired), unspecified foot: Secondary | ICD-10-CM | POA: Diagnosis not present

## 2013-07-06 DIAGNOSIS — M25579 Pain in unspecified ankle and joints of unspecified foot: Secondary | ICD-10-CM | POA: Diagnosis not present

## 2013-07-06 DIAGNOSIS — B351 Tinea unguium: Secondary | ICD-10-CM | POA: Diagnosis not present

## 2013-07-06 DIAGNOSIS — M79609 Pain in unspecified limb: Secondary | ICD-10-CM | POA: Diagnosis not present

## 2013-08-12 DIAGNOSIS — M2550 Pain in unspecified joint: Secondary | ICD-10-CM | POA: Diagnosis not present

## 2013-08-12 DIAGNOSIS — M1A00X1 Idiopathic chronic gout, unspecified site, with tophus (tophi): Secondary | ICD-10-CM | POA: Diagnosis not present

## 2013-08-13 ENCOUNTER — Other Ambulatory Visit: Payer: Medicare Other

## 2013-08-13 ENCOUNTER — Encounter: Payer: Self-pay | Admitting: Cardiology

## 2013-08-13 ENCOUNTER — Ambulatory Visit (INDEPENDENT_AMBULATORY_CARE_PROVIDER_SITE_OTHER): Payer: Medicare Other | Admitting: Cardiology

## 2013-08-13 VITALS — BP 140/90 | HR 80 | Ht 71.0 in | Wt 230.1 lb

## 2013-08-13 DIAGNOSIS — I10 Essential (primary) hypertension: Secondary | ICD-10-CM

## 2013-08-13 DIAGNOSIS — K56609 Unspecified intestinal obstruction, unspecified as to partial versus complete obstruction: Secondary | ICD-10-CM

## 2013-08-13 DIAGNOSIS — E785 Hyperlipidemia, unspecified: Secondary | ICD-10-CM

## 2013-08-13 DIAGNOSIS — I2510 Atherosclerotic heart disease of native coronary artery without angina pectoris: Secondary | ICD-10-CM | POA: Diagnosis not present

## 2013-08-13 LAB — BASIC METABOLIC PANEL
BUN: 12 mg/dL (ref 6–23)
CO2: 26 mEq/L (ref 19–32)
GFR: 74.67 mL/min (ref >60.00)
Glucose, Bld: 91 mg/dL (ref 70–99)
Potassium: 4.3 mEq/L (ref 3.5–5.1)
Sodium: 140 mEq/L (ref 135–145)

## 2013-08-13 LAB — HEPATIC FUNCTION PANEL
ALT: 19 U/L (ref 0–53)
AST: 19 U/L (ref 0–37)
Bilirubin, Direct: 0.2 mg/dL (ref 0.0–0.3)
Total Bilirubin: 1.3 mg/dL — ABNORMAL HIGH (ref 0.3–1.2)

## 2013-08-13 LAB — LIPID PANEL
Total CHOL/HDL Ratio: 3
VLDL: 24.2 mg/dL (ref 0.0–40.0)

## 2013-08-13 MED ORDER — VERAPAMIL HCL ER 240 MG PO TBCR: 240.0000 mg | EXTENDED_RELEASE_TABLET | Freq: Every day | ORAL | Status: DC

## 2013-08-13 MED ORDER — INDOMETHACIN 50 MG PO CAPS: 50.0000 mg | ORAL_CAPSULE | Freq: Two times a day (BID) | ORAL | Status: DC

## 2013-08-13 NOTE — Addendum Note (Signed)
Addended by: Williemae Area on: 08/13/2013 09:47 AM   Modules accepted: Orders

## 2013-08-13 NOTE — Addendum Note (Signed)
Addended by: Meda Klinefelter D on: 08/13/2013 09:46 AM   Modules accepted: Orders

## 2013-08-13 NOTE — Progress Notes (Signed)
Gregory Wheeler Date of Birth: 25-Aug-1941   History of Present Illness: Gregory Wheeler is seen today for a followup visit. He has a history of coronary disease and is status post stenting of the LAD with overlapping Taxus stents in 2009. He denies any significant chest pain. He does have some mild shortness of breath which is chronic.He is going to be treated with a new IV medicine for gout.  Current Outpatient Prescriptions on File Prior to Visit  Medication Sig Dispense Refill  . allopurinol (ZYLOPRIM) 100 MG tablet Take 100 mg by mouth as directed.      Marland Kitchen aspirin 81 MG tablet Take 81 mg by mouth daily.        . clopidogrel (PLAVIX) 75 MG tablet Take 1 tablet (75 mg total) by mouth daily.  30 tablet  10   No current facility-administered medications on file prior to visit.    No Known Allergies  Past Medical History  Diagnosis Date  . Coronary artery disease 12/2007    STATUS POST STENTING OF THE LEFT ANTERIOR DESCENDING ARTERY  . Hypertension   . Hyperlipidemia   . SBO (small bowel obstruction)     PARTIAL  . LBP (low back pain)   . Arthritis   . Gout   . RBBB (right bundle branch block)   . Partial small bowel obstruction   . Hiatal hernia   . Chronic gout   . Urinary tract infection     Escherichia coli urinary tract infection  . Dyslipidemia     Past Surgical History  Procedure Laterality Date  . Hernia repair      INGUINAL  . Rotator cuff repair      History  Smoking status  . Former Smoker  . Quit date: 08/27/1990  Smokeless tobacco  . Not on file    History  Alcohol Use No    Family History  Problem Relation Age of Onset  . Emphysema Mother 21  . Heart attack Father 65    Review of Systems: As noted in HPI All other systems were reviewed and are negative.  Physical Exam: BP 140/90  Pulse 80  Ht 5\' 11"  (1.803 m)  Wt 230 lb 1.9 oz (104.382 kg)  BMI 32.11 kg/m2 He is an overweight white male in no acute distress. He has no JVD or bruits.  HEENT exam is unremarkable. PERRLA, sclera are clear. Oropharynx is clear. Lungs are clear. Cardiac exam reveals a regular rate and rhythm without gallop or murmur. Abdomen is obese, soft, nontender. He has trace  edema. Pedal pulses are 2+ and symmetric. He is alert and oriented x3. Cranial nerves II through XII are intact. LABORATORY DATA: Lab Results  Component Value Date   WBC 5.8 02/18/2010   HGB 12.1* 02/18/2010   HCT 35.5* 02/18/2010   PLT 188 02/18/2010   GLUCOSE 86 08/06/2012   CHOL 127 08/06/2012   TRIG 126.0 08/06/2012   HDL 34.60* 08/06/2012   LDLCALC 67 08/06/2012   ALT 21 08/06/2012   AST 20 08/06/2012   NA 140 08/06/2012   K 4.3 08/06/2012   CL 102 08/06/2012   CREATININE 1.1 08/06/2012   BUN 12 08/06/2012   CO2 29 08/06/2012     Assessment / Plan: 1. Coronary disease status post stenting of the LAD in 2009 with Taxus stents. Nuclear stress test in June of 2011 showed a small apical infarct with normal ejection fraction. Continue with risk factor modification. Continue aspirin and Plavix. Patient  remains asymptomatic.  2. Hypertension. Improved control. I have urged sodium restriction.  3. Hyperlipidemia  on simvastatin. Will check fasting lipids and chemistries today.

## 2013-08-13 NOTE — Patient Instructions (Signed)
Continue your current therapy  We will call with the results of your lab work today.  I will see you in 6 months.

## 2013-09-07 ENCOUNTER — Other Ambulatory Visit: Payer: Self-pay

## 2013-09-07 MED ORDER — INDOMETHACIN 50 MG PO CAPS
50.0000 mg | ORAL_CAPSULE | Freq: Two times a day (BID) | ORAL | Status: DC
Start: 2013-09-07 — End: 2014-07-06

## 2013-09-10 ENCOUNTER — Telehealth: Payer: Self-pay

## 2013-09-10 NOTE — Telephone Encounter (Signed)
Returned call to patient no answer.LMTC. 

## 2013-09-10 NOTE — Telephone Encounter (Signed)
Patient called spoke to wife she stated allopurinol refilled by PCP.Stated refill already sent to pharmacy.

## 2013-10-07 DIAGNOSIS — M1A00X1 Idiopathic chronic gout, unspecified site, with tophus (tophi): Secondary | ICD-10-CM | POA: Diagnosis not present

## 2013-12-07 DIAGNOSIS — M25519 Pain in unspecified shoulder: Secondary | ICD-10-CM | POA: Diagnosis not present

## 2013-12-07 DIAGNOSIS — M1A00X1 Idiopathic chronic gout, unspecified site, with tophus (tophi): Secondary | ICD-10-CM | POA: Diagnosis not present

## 2013-12-08 ENCOUNTER — Telehealth: Payer: Self-pay | Admitting: Cardiology

## 2013-12-08 NOTE — Telephone Encounter (Signed)
New message         Pt has tingling sensation is his arm for about 3 weeks. Is this due to his stent being put in?

## 2013-12-08 NOTE — Telephone Encounter (Signed)
Returned call to patient spoke to wife.She stated husband has been having pain in left neck,shoulder,lf arm off and on for the past 3 weeks.No chest pain.Stated he saw PCP and was told he needed to see Dr.Jordan.Appointment scheduled with Dr.Jordan 12/09/13 at 11:45 am.

## 2013-12-09 ENCOUNTER — Encounter: Payer: Self-pay | Admitting: Cardiology

## 2013-12-09 ENCOUNTER — Ambulatory Visit (INDEPENDENT_AMBULATORY_CARE_PROVIDER_SITE_OTHER): Payer: Medicare Other | Admitting: Cardiology

## 2013-12-09 VITALS — BP 137/80 | HR 52 | Ht 71.0 in | Wt 231.8 lb

## 2013-12-09 DIAGNOSIS — I4510 Unspecified right bundle-branch block: Secondary | ICD-10-CM

## 2013-12-09 DIAGNOSIS — I441 Atrioventricular block, second degree: Secondary | ICD-10-CM | POA: Insufficient documentation

## 2013-12-09 DIAGNOSIS — I2510 Atherosclerotic heart disease of native coronary artery without angina pectoris: Secondary | ICD-10-CM

## 2013-12-09 DIAGNOSIS — I10 Essential (primary) hypertension: Secondary | ICD-10-CM

## 2013-12-09 DIAGNOSIS — E785 Hyperlipidemia, unspecified: Secondary | ICD-10-CM

## 2013-12-09 MED ORDER — LISINOPRIL 20 MG PO TABS: 20.0000 mg | ORAL_TABLET | Freq: Every day | ORAL | Status: DC

## 2013-12-09 NOTE — Progress Notes (Signed)
Gregory Wheeler Check Date of Birth: 08-18-1941   History of Present Illness: [name] is seen today as a work in visit. He has a history of coronary disease and is status post stenting of the LAD with overlapping Taxus stents in 2009. His last myoview in 2011 showed mild apical thinning.  Recently he has experienced a tingling sensation in his left arm. He also complains of pain in the more posterior aspect of his left neck and shoulder. This is worse after he has been leaning over- working on his car. He has had more problems with gout. His right leg is more swollen. He has noted lightheadedness only once.   Current Outpatient Prescriptions on File Prior to Visit  Medication Sig Dispense Refill  . aspirin 81 MG tablet Take 81 mg by mouth daily.        . clopidogrel (PLAVIX) 75 MG tablet Take 1 tablet (75 mg total) by mouth daily.  30 tablet  10  . indomethacin (INDOCIN) 50 MG capsule Take 1 capsule (50 mg total) by mouth 2 (two) times daily with a meal.  60 capsule  3  . simvastatin (ZOCOR) 40 MG tablet Take 40 mg by mouth daily. Take one tab in the am       No current facility-administered medications on file prior to visit.    No Known Allergies  Past Medical History  Diagnosis Date  . Coronary artery disease 12/2007    STATUS POST STENTING OF THE LEFT ANTERIOR DESCENDING ARTERY  . Hypertension   . Hyperlipidemia   . SBO (small bowel obstruction)     PARTIAL  . LBP (low back pain)   . Arthritis   . Gout   . RBBB (right bundle branch block)   . Partial small bowel obstruction   . Hiatal hernia   . Chronic gout   . Urinary tract infection     Escherichia coli urinary tract infection  . Dyslipidemia   . Second degree AV block, Mobitz type I     Past Surgical History  Procedure Laterality Date  . Hernia repair      INGUINAL  . Rotator cuff repair      History  Smoking status  . Former Smoker  . Quit date: 08/27/1990  Smokeless tobacco  . Not on file    History   Alcohol Use No    Family History  Problem Relation Age of Onset  . Emphysema Mother 3071  . Heart attack Father 1653    Review of Systems: As noted in HPI All other systems were reviewed and are negative.  Physical Exam: BP 137/80  Pulse 52  Ht 5\' 11"  (1.803 m)  Wt 231 lb 12.8 oz (105.144 kg)  BMI 32.34 kg/m2 He is an overweight white male in no acute distress. He has no JVD or bruits. HEENT exam is unremarkable. PERRLA, sclera are clear. Oropharynx is clear. Lungs are clear. Cardiac exam reveals a regular rate and rhythm without gallop or murmur. Abdomen is obese, soft, nontender. He has 1+ right leg edema. trace on the left.  Pedal pulses are 2+ and symmetric. He is alert and oriented x3. Cranial nerves II through XII are intact.  LABORATORY DATA: Lab Results  Component Value Date   WBC 5.8 02/18/2010   HGB 12.1* 02/18/2010   HCT 35.5* 02/18/2010   PLT 188 02/18/2010   GLUCOSE 91 08/13/2013   CHOL 152 08/13/2013   TRIG 121.0 08/13/2013   HDL 59.10 08/13/2013  LDLCALC 69 08/13/2013   ALT 19 08/13/2013   AST 19 08/13/2013   NA 140 08/13/2013   K 4.3 08/13/2013   CL 104 08/13/2013   CREATININE 1.0 08/13/2013   BUN 12 08/13/2013   CO2 26 08/13/2013   Ecg: NSR with second degree Mobitz type one heart block. Rate 52 bpm. RBBB.  Assessment / Plan: 1. Coronary disease status post stenting of the LAD in 2009 with Taxus stents. Nuclear stress test in June of 2011 showed a small apical infarct with normal ejection fraction. Continue with risk factor modification. Continue aspirin and Plavix. Recent symptoms of neck/shoulder pain and arm numbness sound more musculoskeletal but it is time to update a stress test. Will schedule for a Lexiscan myoview.  2. Hypertension. Well controlled.  3. Hyperlipidemia  on simvastatin.   4. Mobitz type 1 second degree AV block. Patient is on verapamil. This dose was increased last visit. He is not significantly symptomatic. Will DC verapamil. Start  lisinopril 20 mg daily for BP. Restrict salt intake. Repeat BMET on his OV in June.

## 2013-12-09 NOTE — Patient Instructions (Addendum)
Stop Verapamil  Start lisinopril 20 mg daily for BP  We will schedule you for a nuclear stress test.  We will check your potassium level in June.  Keep your appointment in June

## 2013-12-24 ENCOUNTER — Ambulatory Visit (HOSPITAL_COMMUNITY): Payer: Medicare Other | Attending: Cardiovascular Disease | Admitting: Radiology

## 2013-12-24 VITALS — BP 155/88 | HR 70 | Ht 71.0 in | Wt 230.0 lb

## 2013-12-24 DIAGNOSIS — M25519 Pain in unspecified shoulder: Secondary | ICD-10-CM | POA: Insufficient documentation

## 2013-12-24 DIAGNOSIS — R0602 Shortness of breath: Secondary | ICD-10-CM

## 2013-12-24 DIAGNOSIS — I2510 Atherosclerotic heart disease of native coronary artery without angina pectoris: Secondary | ICD-10-CM | POA: Diagnosis not present

## 2013-12-24 DIAGNOSIS — I4510 Unspecified right bundle-branch block: Secondary | ICD-10-CM

## 2013-12-24 DIAGNOSIS — I441 Atrioventricular block, second degree: Secondary | ICD-10-CM

## 2013-12-24 DIAGNOSIS — R0609 Other forms of dyspnea: Secondary | ICD-10-CM | POA: Diagnosis not present

## 2013-12-24 DIAGNOSIS — Z87891 Personal history of nicotine dependence: Secondary | ICD-10-CM | POA: Insufficient documentation

## 2013-12-24 DIAGNOSIS — M79609 Pain in unspecified limb: Secondary | ICD-10-CM | POA: Diagnosis not present

## 2013-12-24 DIAGNOSIS — Z8249 Family history of ischemic heart disease and other diseases of the circulatory system: Secondary | ICD-10-CM | POA: Diagnosis not present

## 2013-12-24 DIAGNOSIS — M542 Cervicalgia: Secondary | ICD-10-CM | POA: Insufficient documentation

## 2013-12-24 DIAGNOSIS — Z951 Presence of aortocoronary bypass graft: Secondary | ICD-10-CM | POA: Insufficient documentation

## 2013-12-24 DIAGNOSIS — I10 Essential (primary) hypertension: Secondary | ICD-10-CM

## 2013-12-24 DIAGNOSIS — E785 Hyperlipidemia, unspecified: Secondary | ICD-10-CM

## 2013-12-24 DIAGNOSIS — R0989 Other specified symptoms and signs involving the circulatory and respiratory systems: Secondary | ICD-10-CM | POA: Diagnosis not present

## 2013-12-24 MED ORDER — TECHNETIUM TC 99M SESTAMIBI GENERIC - CARDIOLITE
30.0000 | Freq: Once | INTRAVENOUS | Status: AC | PRN
  Administered 2013-12-24: 30 via INTRAVENOUS

## 2013-12-24 MED ORDER — TECHNETIUM TC 99M SESTAMIBI GENERIC - CARDIOLITE
10.0000 | Freq: Once | INTRAVENOUS | Status: AC | PRN
  Administered 2013-12-24: 10 via INTRAVENOUS

## 2013-12-24 MED ORDER — REGADENOSON 0.4 MG/5ML IV SOLN
0.4000 mg | Freq: Once | INTRAVENOUS | Status: AC
  Administered 2013-12-24: 0.4 mg via INTRAVENOUS

## 2013-12-24 NOTE — Progress Notes (Signed)
[facility] S[facility] NUCLEAR MED [address] [place], [place] [id number] [phone]    Cardiology Nuclear Med Study  Gregory Wheeler is a 73 y.o. male     MRN : [id number]     DOB: [birth year]  Procedure Date: 12/24/2013  Nuclear Med Background Indication for Stress Test:  Evaluation for Ischemia and Stent Patency History:  Cath, Stent (LAD), 2011 Myocardial Perfusion Imaging-Small Apical Infarct, EF=80% Cardiac Risk Factors: Family History - CAD, History of Smoking, Hypertension, Lipids and RBBB  Symptoms:  DOE, SOB and (L) arm,neck and shoulder pain   Nuclear Pre-Procedure .Caffeine/Decaff Intake:  None NPO After: 6:30 pm   Lungs:  clear O2 Sat: 92% on room air. IV 0.9% NS with Angio Cath:  22g  IV Site: R Hand  IV Started by:  Bonnita LevanJackie Smith, RN  Chest Size (in):  46 Cup Size: n/a  Height: 5\' 11"  (1.803 m)  Weight:  230 lb (104.327 kg)  BMI:  Body mass index is 32.09 kg/(m^2). Tech Comments:  Patient took morning medications. Irean HongPatsy Edwards, RN.    Nuclear Med Study 1 or 2 day study: 1 day  Stress Test Type:  Eugenie BirksLexiscan  Reading MD: N/A  Order Authorizing Provider:  Peter SwazilandJordan, MD  Resting Radionuclide: Technetium 7846m Sestamibi  Resting Radionuclide Dose: 11.0 mCi   Stress Radionuclide:  Technetium 3746m Sestamibi  Stress Radionuclide Dose: 33.0 mCi           Stress Protocol Rest HR: 70 Stress HR: 94  Rest BP: 155/88 Stress BP: 169/81  Exercise Time (min): n/a METS: n/a   Predicted Max HR: 148 bpm % Max HR: 63.51 bpm Rate Pressure Product: [id number]   Dose of Adenosine (mg):  n/a Dose of Lexiscan: 0.4 mg  Dose of Atropine (mg): n/a Dose of Dobutamine: n/a mcg/kg/min (at max HR)  Stress Test Technologist: Irean HongPatsy Edwards, RN  Nuclear Technologist:  Domenic PoliteStephen Carbone, CNMT     Rest Procedure:  Myocardial perfusion imaging was performed at rest 45 minutes following the intravenous administration of Technetium 8746m Sestamibi. Rest ECG: NSR-RBBB  Stress Procedure:   The patient received IV Lexiscan 0.4 mg over 15-seconds.  Technetium 8046m Sestamibi injected at 30-seconds.  The patient complained of dizziness, but denied chest pain. Quantitative spect images were obtained after a 45 minute delay. Stress ECG: No significant change from baseline ECG  QPS Raw Data Images:  Normal; no motion artifact; normal heart/lung ratio. Stress Images:  There is mild apical thinning with normal uptake in other regions. Rest Images:  There is mild apical thinning with normal uptake in other regions. Subtraction (SDS):  No evidence of ischemia. Transient Ischemic Dilatation (Normal <1.22): 1.03 Lung/Heart Ratio (Normal <0.45):  .30   Quantitative Gated Spect Images QGS EDV:  113 ml QGS ESV:  37 ml  Impression Exercise Capacity:  Lexiscan with no exercise. BP Response:  Normal blood pressure response. Clinical Symptoms:  No significant symptoms noted. ECG Impression:  RBBB unchanged from baseline Comparison with Prior Nuclear Study: Since prior myoview the apical defect is less pronounced now.  Overall Impression:  Low risk stress nuclear study.  Small apical scar vs. Physiologic thinning. Normal apical wall motion.  LV Ejection Fraction: 67%.  LV Wall Motion:  NL LV Function; NL Wall Motion  Cassell Clementhomas Abdikadir Fohl MD

## 2013-12-29 ENCOUNTER — Telehealth: Payer: Self-pay | Admitting: Cardiology

## 2013-12-29 NOTE — Telephone Encounter (Signed)
Returned call to patient myoview results given. 

## 2013-12-29 NOTE — Telephone Encounter (Signed)
New message ° ° ° ° ° °Returning a nurses call °

## 2014-01-07 ENCOUNTER — Telehealth: Payer: Self-pay | Admitting: Cardiology

## 2014-01-07 MED ORDER — LOSARTAN POTASSIUM 50 MG PO TABS: 50.0000 mg | ORAL_TABLET | Freq: Every day | ORAL | Status: DC

## 2014-01-07 NOTE — Telephone Encounter (Signed)
Returned call to patient's wife no answer.LMTC. 

## 2014-01-07 NOTE — Telephone Encounter (Signed)
New message     Wife calling stating patient blood pressure is low . Yesterday 93/63. Last night  112/60. Today blood pressure was check did not tell wife.     Wife is asking if medication is causing him to cough at night.

## 2014-01-07 NOTE — Telephone Encounter (Signed)
Returned call to wife phone rings busy.

## 2014-01-07 NOTE — Telephone Encounter (Signed)
Spoke to wife she stated husband has a non productive cough and low B/P since starting on lisinopril.B/P 93/63,112/60. Stated he also is drinking too much alcohol.Stated he would not want her to tell that.Dr.Jordan advised to stop lisinopril and start losartan 50 mg daily.Advised to monitor B/P and if continues to be low call back.Advised to decrease alcohol intake.

## 2014-01-08 NOTE — Addendum Note (Signed)
Addended by: Meda KlinefelterPUGH, Mamadou Breon JOHNSON D on: 01/08/2014 06:49 PM   Modules accepted: Orders, Medications

## 2014-01-08 NOTE — Telephone Encounter (Signed)
Spoke to patient he stated when he woke up this morning B/P 140/84 he took losartan 50 mg.Stated this afternoon B/P 70/44.Dr.Jordan advised to decrease losartan to 25 mg daily.Advised to monitor B/P and if B/P less than 100 systolic hold losartan.Advised to call me back Monday 01/11/14 to report B/P readings.

## 2014-01-12 ENCOUNTER — Other Ambulatory Visit: Payer: Self-pay

## 2014-01-12 MED ORDER — LOSARTAN POTASSIUM 25 MG PO TABS: 25.0000 mg | ORAL_TABLET | Freq: Every day | ORAL | Status: DC

## 2014-01-12 NOTE — Telephone Encounter (Signed)
Patient called spoke to wife.She stated patient's B/P better.Stated this morning B/P 140/80.Advised to continue to monitor and call back if B/P not under control.

## 2014-02-08 ENCOUNTER — Other Ambulatory Visit: Payer: Self-pay | Admitting: Cardiology

## 2014-02-15 ENCOUNTER — Encounter: Payer: Self-pay | Admitting: Cardiology

## 2014-02-15 ENCOUNTER — Ambulatory Visit (INDEPENDENT_AMBULATORY_CARE_PROVIDER_SITE_OTHER): Payer: Medicare Other | Admitting: Cardiology

## 2014-02-15 VITALS — BP 136/78 | HR 80 | Ht 71.0 in | Wt 231.0 lb

## 2014-02-15 DIAGNOSIS — I209 Angina pectoris, unspecified: Secondary | ICD-10-CM

## 2014-02-15 DIAGNOSIS — I2510 Atherosclerotic heart disease of native coronary artery without angina pectoris: Secondary | ICD-10-CM

## 2014-02-15 DIAGNOSIS — E785 Hyperlipidemia, unspecified: Secondary | ICD-10-CM | POA: Diagnosis not present

## 2014-02-15 DIAGNOSIS — I25119 Atherosclerotic heart disease of native coronary artery with unspecified angina pectoris: Secondary | ICD-10-CM

## 2014-02-15 DIAGNOSIS — I10 Essential (primary) hypertension: Secondary | ICD-10-CM | POA: Diagnosis not present

## 2014-02-15 NOTE — Progress Notes (Signed)
Gregory Wheeler Date of Birth: [birth year]   History of Present Illness: [name] is seen for a follow up visit.  He has a history of coronary disease and is status post stenting of the LAD with overlapping Taxus stents in 2009. His last myoview in April 2015 showed mild apical thinning, normal EF 67%.  On follow up today he is feeling well. No chest pain. No SOB. Staying active.  Current Outpatient Prescriptions on File Prior to Visit  Medication Sig Dispense Refill  . ALLOPURINOL PO Take 450 mg by mouth daily.      Marland Kitchen. aspirin 81 MG tablet Take 81 mg by mouth daily.        . clopidogrel (PLAVIX) 75 MG tablet TAKE ONE TABLET BY MOUTH ONCE DAILY  30 tablet  0  . indomethacin (INDOCIN) 50 MG capsule Take 1 capsule (50 mg total) by mouth 2 (two) times daily with a meal.  60 capsule  3  . losartan (COZAAR) 25 MG tablet Take 1 tablet (25 mg total) by mouth daily.  30 tablet  6  . simvastatin (ZOCOR) 40 MG tablet Take 40 mg by mouth daily. Take one tab in the am       No current facility-administered medications on file prior to visit.    No Known Allergies  Past Medical History  Diagnosis Date  . Coronary artery disease 12/2007    STATUS POST STENTING OF THE LEFT ANTERIOR DESCENDING ARTERY  . Hypertension   . Hyperlipidemia   . SBO (small bowel obstruction)     PARTIAL  . LBP (low back pain)   . Arthritis   . Gout   . RBBB (right bundle branch block)   . Partial small bowel obstruction   . Hiatal hernia   . Chronic gout   . Urinary tract infection     Escherichia coli urinary tract infection  . Dyslipidemia   . Second degree AV block, Mobitz type I     Past Surgical History  Procedure Laterality Date  . Hernia repair      INGUINAL  . Rotator cuff repair      History  Smoking status  . Former Smoker  . Quit date: 08/27/1990  Smokeless tobacco  . Not on file    History  Alcohol Use No    Family History  Problem Relation Age of Onset  . Emphysema Mother 3271  .  Heart attack Father [id number]    Review of Systems: As noted in HPI All other systems were reviewed and are negative.  Physical Exam: BP 136/78  Pulse 80  Ht 5\' 11"  (1.803 m)  Wt 231 lb (104.781 kg)  BMI 32.23 kg/m2 He is an overweight white male in no acute distress. He has no JVD or bruits. HEENT exam is unremarkable. PERRLA, sclera are clear. Oropharynx is clear. Lungs are clear. Cardiac exam reveals a regular rate and rhythm without gallop or murmur. Abdomen is obese, soft, nontender. He has 1+ right leg edema. trace on the left.  Pedal pulses are 2+ and symmetric. He is alert and oriented x3. Cranial nerves II through XII are intact.  LABORATORY DATA: Lab Results  Component Value Date   WBC 5.8 02/18/2010   HGB 12.1* 02/18/2010   HCT 35.5* 02/18/2010   PLT 188 02/18/2010   GLUCOSE 91 08/13/2013   CHOL 152 08/13/2013   TRIG 121.0 08/13/2013   HDL 59.10 08/13/2013   LDLCALC 69 08/13/2013   ALT 19 08/13/2013  AST 19 08/13/2013   NA 140 08/13/2013   K 4.3 08/13/2013   CL 104 08/13/2013   CREATININE 1.0 08/13/2013   BUN 12 08/13/2013   CO2 26 08/13/2013   Cardiology Nuclear Med Study  Gregory Wheeler is a 73 y.o. male MRN : [id number] DOB: 06/06/1941  Procedure Date: 12/24/2013  Nuclear Med Background  Indication for Stress Test: Evaluation for Ischemia and Stent Patency  History: Cath, Stent (LAD), 2011 Myocardial Perfusion Imaging-Small Apical Infarct, EF=80%  Cardiac Risk Factors: Family History - CAD, History of Smoking, Hypertension, Lipids and RBBB  Symptoms: DOE, SOB and (L) arm,neck and shoulder pain  Nuclear Pre-Procedure  .Caffeine/Decaff Intake: None  NPO After: 6:30 pm   Lungs: clear  O2 Sat: 92% on room air.  IV 0.9% NS with Angio Cath: 22g   IV Site: R Hand  IV Started by: Bonnita LevanJackie Smith, RN   Chest Size (in): 46  Cup Size: n/a   Height: 5\' 11"  (1.803 m)  Weight: 230 lb (104.327 kg)   BMI: Body mass index is 32.09 kg/(m^2).  Tech Comments: Patient took morning  medications. Irean HongPatsy Edwards, RN.   Nuclear Med Study  1 or 2 day study: 1 day  Stress Test Type: Eugenie BirksLexiscan   Reading MD: N/A  Order Authorizing Provider: Peter SwazilandJordan, MD   Resting Radionuclide: Technetium 2777m Sestamibi  Resting Radionuclide Dose: 11.0 mCi   Stress Radionuclide: Technetium 3577m Sestamibi  Stress Radionuclide Dose: 33.0 mCi   Stress Protocol  Rest HR: 70  Stress HR: 94   Rest BP: 155/88  Stress BP: 169/81   Exercise Time (min): n/a  METS: n/a   Predicted Max HR: 148 bpm  % Max HR: 63.51 bpm  Rate Pressure Product: [id number]  Dose of Adenosine (mg): n/a  Dose of Lexiscan: 0.4 mg   Dose of Atropine (mg): n/a  Dose of Dobutamine: n/a mcg/kg/min (at max HR)   Stress Test Technologist: Irean HongPatsy Edwards, RN  Nuclear Technologist: Domenic PoliteStephen Carbone, CNMT   Rest Procedure: Myocardial perfusion imaging was performed at rest 45 minutes following the intravenous administration of Technetium 9177m Sestamibi.  Rest ECG: NSR-RBBB  Stress Procedure: The patient received IV Lexiscan 0.4 mg over 15-seconds. Technetium 5677m Sestamibi injected at 30-seconds. The patient complained of dizziness, but denied chest pain. Quantitative spect images were obtained after a 45 minute delay.  Stress ECG: No significant change from baseline ECG  QPS  Raw Data Images: Normal; no motion artifact; normal heart/lung ratio.  Stress Images: There is mild apical thinning with normal uptake in other regions.  Rest Images: There is mild apical thinning with normal uptake in other regions.  Subtraction (SDS): No evidence of ischemia.  Transient Ischemic Dilatation (Normal <1.22): 1.03  Lung/Heart Ratio (Normal <0.45): .30  Quantitative Gated Spect Images  QGS EDV: 113 ml  QGS ESV: 37 ml  Impression  Exercise Capacity: Lexiscan with no exercise.  BP Response: Normal blood pressure response.  Clinical Symptoms: No significant symptoms noted.  ECG Impression: RBBB unchanged from baseline  Comparison with Prior Nuclear  Study: Since prior myoview the apical defect is less pronounced now.  Overall Impression: Low risk stress nuclear study. Small apical scar vs. Physiologic thinning. Normal apical wall motion.  LV Ejection Fraction: 67%. LV Wall Motion: NL LV Function; NL Wall Motion  Cassell Clementhomas Brackbill MD      Assessment / Plan: 1. Coronary disease status post stenting of the LAD in 2009 with Taxus stents. Nuclear stress test in April 2015  showed a small apical infarct with normal ejection fraction. Continue with risk factor modification. Continue aspirin and Plavix.   2. Hypertension. Well controlled.  3. Hyperlipidemia  on simvastatin.   4. Mobitz type 1 second degree AV block. Asymptomatic. On no AV nodal blocking agents (previously on verapamil.

## 2014-02-15 NOTE — Patient Instructions (Addendum)
Continue your current therapy  I will see you in 6 months with fasting lab work.   

## 2014-03-11 ENCOUNTER — Other Ambulatory Visit: Payer: Self-pay | Admitting: Cardiology

## 2014-04-14 ENCOUNTER — Other Ambulatory Visit: Payer: Self-pay | Admitting: Cardiology

## 2014-07-06 ENCOUNTER — Other Ambulatory Visit: Payer: Self-pay | Admitting: Cardiology

## 2014-07-06 MED ORDER — INDOMETHACIN 50 MG PO CAPS: 50.0000 mg | ORAL_CAPSULE | Freq: Two times a day (BID) | ORAL | Status: DC

## 2014-07-19 DIAGNOSIS — M1A9XX1 Chronic gout, unspecified, with tophus (tophi): Secondary | ICD-10-CM | POA: Diagnosis not present

## 2014-08-09 ENCOUNTER — Other Ambulatory Visit: Payer: Self-pay | Admitting: *Deleted

## 2014-08-09 ENCOUNTER — Telehealth: Payer: Self-pay | Admitting: Cardiology

## 2014-08-09 MED ORDER — CLOPIDOGREL BISULFATE 75 MG PO TABS: 75.0000 mg | ORAL_TABLET | Freq: Every day | ORAL | Status: DC

## 2014-08-09 MED ORDER — SIMVASTATIN 40 MG PO TABS: 40.0000 mg | ORAL_TABLET | Freq: Every day | ORAL | Status: DC

## 2014-08-09 NOTE — Telephone Encounter (Signed)
Pt need new prescription for Simvastatin 40 mg #90 and Clopidgrel 75 mg #90. [facility]

## 2014-08-24 ENCOUNTER — Encounter: Payer: Self-pay | Admitting: Cardiology

## 2014-08-24 ENCOUNTER — Ambulatory Visit (INDEPENDENT_AMBULATORY_CARE_PROVIDER_SITE_OTHER): Payer: Medicare Other | Admitting: Cardiology

## 2014-08-24 VITALS — BP 160/90 | HR 80 | Ht 70.0 in | Wt 240.0 lb

## 2014-08-24 DIAGNOSIS — I4510 Unspecified right bundle-branch block: Secondary | ICD-10-CM | POA: Diagnosis not present

## 2014-08-24 DIAGNOSIS — E785 Hyperlipidemia, unspecified: Secondary | ICD-10-CM | POA: Diagnosis not present

## 2014-08-24 DIAGNOSIS — I10 Essential (primary) hypertension: Secondary | ICD-10-CM

## 2014-08-24 DIAGNOSIS — I2510 Atherosclerotic heart disease of native coronary artery without angina pectoris: Secondary | ICD-10-CM

## 2014-08-24 LAB — BASIC METABOLIC PANEL
BUN: 11 mg/dL (ref 6–23)
CALCIUM: 9.6 mg/dL (ref 8.4–10.5)
CO2: 28 mEq/L (ref 19–32)
CREATININE: 1.1 mg/dL (ref 0.50–1.35)
Chloride: 103 mEq/L (ref 96–112)
GLUCOSE: 91 mg/dL (ref 70–99)
Potassium: 5.3 mEq/L (ref 3.5–5.3)
SODIUM: 139 meq/L (ref 135–145)

## 2014-08-24 LAB — CBC WITH DIFFERENTIAL/PLATELET
BASOS ABS: 0.1 10*3/uL (ref 0.0–0.1)
BASOS PCT: 1 % (ref 0–1)
EOS ABS: 0.3 10*3/uL (ref 0.0–0.7)
Eosinophils Relative: 3 % (ref 0–5)
HCT: 43 % (ref 39.0–52.0)
HEMOGLOBIN: 14.8 g/dL (ref 13.0–17.0)
LYMPHS ABS: 1.5 10*3/uL (ref 0.7–4.0)
Lymphocytes Relative: 16 % (ref 12–46)
MCH: 33.1 pg (ref 26.0–34.0)
MCHC: 34.4 g/dL (ref 30.0–36.0)
MCV: 96.2 fL (ref 78.0–100.0)
MPV: 9.2 fL (ref 8.6–12.4)
Monocytes Absolute: 0.9 10*3/uL (ref 0.1–1.0)
Monocytes Relative: 10 % (ref 3–12)
NEUTROS PCT: 70 % (ref 43–77)
Neutro Abs: 6.5 10*3/uL (ref 1.7–7.7)
PLATELETS: 194 10*3/uL (ref 150–400)
RBC: 4.47 MIL/uL (ref 4.22–5.81)
RDW: 13.7 % (ref 11.5–15.5)
WBC: 9.3 10*3/uL (ref 4.0–10.5)

## 2014-08-24 LAB — LIPID PANEL
CHOL/HDL RATIO: 2.2 ratio
Cholesterol: 128 mg/dL (ref 0–200)
HDL: 57 mg/dL (ref >39)
LDL CALC: 57 mg/dL (ref 0–99)
TRIGLYCERIDES: 70 mg/dL (ref <150)
VLDL: 14 mg/dL (ref 0–40)

## 2014-08-24 LAB — HEPATIC FUNCTION PANEL
ALBUMIN: 3.9 g/dL (ref 3.5–5.2)
ALT: 18 U/L (ref 0–53)
AST: 19 U/L (ref 0–37)
Alkaline Phosphatase: 73 U/L (ref 39–117)
BILIRUBIN DIRECT: 0.2 mg/dL (ref 0.0–0.3)
Indirect Bilirubin: 0.7 mg/dL (ref 0.2–1.2)
Total Bilirubin: 0.9 mg/dL (ref 0.2–1.2)
Total Protein: 6.4 g/dL (ref 6.0–8.3)

## 2014-08-24 NOTE — Patient Instructions (Signed)
We will check fasting lab work today  Monitor your blood pressure at home. If it remains high let me know.  You need to lose some weight.  I will see you in 6 months.

## 2014-08-24 NOTE — Progress Notes (Signed)
[name] Date of Birth: [birth year]   History of Present Illness: Mr. Amend is seen for follow up of CAD.  He has a history of coronary disease and is status post stenting of the LAD with overlapping Taxus stents in 2009. His last myoview in April 2015 showed mild apical thinning, normal EF 67%.  On follow up today he is feeling well. No chest pain. No SOB. Staying active. Denies dizziness or fatigue. When he checks his BP at home he reports good readings. He has gained 9 lbs.  Current Outpatient Prescriptions on File Prior to Visit  Medication Sig Dispense Refill  . ALLOPURINOL PO Take 600 mg by mouth daily.     Marland Kitchen aspirin 81 MG tablet Take 81 mg by mouth daily.      . clopidogrel (PLAVIX) 75 MG tablet Take 1 tablet (75 mg total) by mouth daily. 90 tablet 1  . indomethacin (INDOCIN) 50 MG capsule Take 1 capsule (50 mg total) by mouth 2 (two) times daily with a meal. 60 capsule 3  . losartan (COZAAR) 25 MG tablet Take 1 tablet (25 mg total) by mouth daily. 30 tablet 6  . simvastatin (ZOCOR) 40 MG tablet Take 1 tablet (40 mg total) by mouth at bedtime. 90 tablet 1   No current facility-administered medications on file prior to visit.    No Known Allergies  Past Medical History  Diagnosis Date  . Coronary artery disease 12/2007    STATUS POST STENTING OF THE LEFT ANTERIOR DESCENDING ARTERY  . Hypertension   . Hyperlipidemia   . SBO (small bowel obstruction)     PARTIAL  . LBP (low back pain)   . Arthritis   . Gout   . RBBB (right bundle branch block)   . Partial small bowel obstruction   . Hiatal hernia   . Chronic gout   . Urinary tract infection     Escherichia coli urinary tract infection  . Dyslipidemia   . Second degree AV block, Mobitz type I     Past Surgical History  Procedure Laterality Date  . Hernia repair      INGUINAL  . Rotator cuff repair      History  Smoking status  . Former Smoker  . Quit date: 08/27/1990  Smokeless tobacco  . Not on file     History  Alcohol Use No    Family History  Problem Relation Age of Onset  . Emphysema Mother 62  . Heart attack Father 68    Review of Systems: As noted in HPI All other systems were reviewed and are negative.  Physical Exam: BP 160/90 mmHg  Pulse 80  Ht 5\' 10"  (1.778 m)  Wt 240 lb (108.863 kg)  BMI 34.44 kg/m2 He is an overweight white male in no acute distress. He has no JVD or bruits. HEENT exam is unremarkable. PERRLA, sclera are clear. Oropharynx is clear. Lungs are clear. Cardiac exam reveals a regular rate and rhythm without gallop or murmur. Abdomen is obese, soft, nontender. He has no edema.  Pedal pulses are 2+ and symmetric. He is alert and oriented x3. Cranial nerves II through XII are intact.  LABORATORY DATA: Lab Results  Component Value Date   WBC 5.8 02/18/2010   HGB 12.1* 02/18/2010   HCT 35.5* 02/18/2010   PLT 188 02/18/2010   GLUCOSE 91 08/13/2013   CHOL 152 08/13/2013   TRIG 121.0 08/13/2013   HDL 59.10 08/13/2013   LDLCALC 69 08/13/2013  ALT 19 08/13/2013   AST 19 08/13/2013   NA 140 08/13/2013   K 4.3 08/13/2013   CL 104 08/13/2013   CREATININE 1.0 08/13/2013   BUN 12 08/13/2013   CO2 26 08/13/2013   Cardiology Nuclear Med Study  Javier GlazierJack S Gorniak is a 73 y.o. male MRN : [id number] DOB: [birth year]  Procedure Date: 12/24/2013  Nuclear Med Background  Indication for Stress Test: Evaluation for Ischemia and Stent Patency  History: Cath, Stent (LAD), 2011 Myocardial Perfusion Imaging-Small Apical Infarct, EF=80%  Cardiac Risk Factors: Family History - CAD, History of Smoking, Hypertension, Lipids and RBBB  Symptoms: DOE, SOB and (L) arm,neck and shoulder pain  Nuclear Pre-Procedure  .Caffeine/Decaff Intake: None  NPO After: 6:30 pm   Lungs: clear  O2 Sat: 92% on room air.  IV 0.9% NS with Angio Cath: 22g   IV Site: R Hand  IV Started by: Bonnita LevanJackie Smith, RN   Chest Size (in): 46  Cup Size: n/a   Height: 5\' 11"  (1.803 m)  Weight: 230 lb (104.327  kg)   BMI: Body mass index is 32.09 kg/(m^2).  Tech Comments: Patient took morning medications. Irean HongPatsy Edwards, RN.   Nuclear Med Study  1 or 2 day study: 1 day  Stress Test Type: Eugenie BirksLexiscan   Reading MD: N/A  Order Authorizing Provider: Manolito Jurewicz [place], MD   Resting Radionuclide: Technetium 5312m Sestamibi  Resting Radionuclide Dose: 11.0 mCi   Stress Radionuclide: Technetium 4112m Sestamibi  Stress Radionuclide Dose: 33.0 mCi   Stress Protocol  Rest HR: 70  Stress HR: 94   Rest BP: 155/88  Stress BP: 169/81   Exercise Time (min): n/a  METS: n/a   Predicted Max HR: 148 bpm  % Max HR: 63.51 bpm  Rate Pressure Product: [id number]  Dose of Adenosine (mg): n/a  Dose of Lexiscan: 0.4 mg   Dose of Atropine (mg): n/a  Dose of Dobutamine: n/a mcg/kg/min (at max HR)   Stress Test Technologist: Irean HongPatsy Edwards, RN  Nuclear Technologist: Domenic PoliteStephen Carbone, CNMT   Rest Procedure: Myocardial perfusion imaging was performed at rest 45 minutes following the intravenous administration of Technetium 9812m Sestamibi.  Rest ECG: NSR-RBBB  Stress Procedure: The patient received IV Lexiscan 0.4 mg over 15-seconds. Technetium 412m Sestamibi injected at 30-seconds. The patient complained of dizziness, but denied chest pain. Quantitative spect images were obtained after a 45 minute delay.  Stress ECG: No significant change from baseline ECG  QPS  Raw Data Images: Normal; no motion artifact; normal heart/lung ratio.  Stress Images: There is mild apical thinning with normal uptake in other regions.  Rest Images: There is mild apical thinning with normal uptake in other regions.  Subtraction (SDS): No evidence of ischemia.  Transient Ischemic Dilatation (Normal <1.22): 1.03  Lung/Heart Ratio (Normal <0.45): .30  Quantitative Gated Spect Images  QGS EDV: 113 ml  QGS ESV: 37 ml  Impression  Exercise Capacity: Lexiscan with no exercise.  BP Response: Normal blood pressure response.  Clinical Symptoms: No significant symptoms noted.   ECG Impression: RBBB unchanged from baseline  Comparison with Prior Nuclear Study: Since prior myoview the apical defect is less pronounced now.  Overall Impression: Low risk stress nuclear study. Small apical scar vs. Physiologic thinning. Normal apical wall motion.  LV Ejection Fraction: 67%. LV Wall Motion: NL LV Function; NL Wall Motion  Cassell Clementhomas Brackbill MD      Assessment / Plan: 1. Coronary disease status post stenting of the LAD in 2009 with Taxus stents. Nuclear  stress test in April 2015 showed a small apical infarct with normal ejection fraction. Continue with risk factor modification. Continue aspirin and Plavix.   2. Hypertension. BP elevated but historically has been well controlled. Will continue medical therapy and focus on weight loss.  3. Hyperlipidemia  on simvastatin. Will check fasting lab work today.  4. Mobitz type 1 second degree AV block. Asymptomatic. On no AV nodal blocking agents (previously on verapamil.

## 2014-08-24 NOTE — Addendum Note (Signed)
Addended by: Sw[place]il[place]JORDAN, PETER M on: 08/24/2014 08:21 AM   Modules accepted: Level of Service

## 2014-08-25 ENCOUNTER — Other Ambulatory Visit: Payer: Self-pay | Admitting: *Deleted

## 2014-08-25 MED ORDER — CLOPIDOGREL BISULFATE 75 MG PO TABS: 75.0000 mg | ORAL_TABLET | Freq: Every day | ORAL | Status: DC

## 2014-08-25 MED ORDER — SIMVASTATIN 40 MG PO TABS: 40.0000 mg | ORAL_TABLET | Freq: Every day | ORAL | Status: DC

## 2014-09-06 ENCOUNTER — Telehealth: Payer: Self-pay | Admitting: Cardiology

## 2014-09-06 ENCOUNTER — Other Ambulatory Visit: Payer: Self-pay

## 2014-09-06 MED ORDER — LOSARTAN POTASSIUM 25 MG PO TABS: 25.0000 mg | ORAL_TABLET | Freq: Every day | ORAL | Status: DC

## 2014-09-06 NOTE — Telephone Encounter (Signed)
Pt need new prescription for his Losartan #90 and refills please. Please send to [facility]-did not know the fax number.

## 2014-10-19 ENCOUNTER — Encounter (HOSPITAL_BASED_OUTPATIENT_CLINIC_OR_DEPARTMENT_OTHER): Payer: Self-pay | Admitting: *Deleted

## 2014-10-19 ENCOUNTER — Emergency Department (HOSPITAL_BASED_OUTPATIENT_CLINIC_OR_DEPARTMENT_OTHER): Payer: Medicare Other

## 2014-10-19 ENCOUNTER — Emergency Department (HOSPITAL_BASED_OUTPATIENT_CLINIC_OR_DEPARTMENT_OTHER)
Admission: EM | Admit: 2014-10-19 | Discharge: 2014-10-19 | Disposition: A | Discharge status: Home / Self Care | Payer: Medicare Other | Attending: Emergency Medicine | Admitting: Emergency Medicine

## 2014-10-19 DIAGNOSIS — I10 Essential (primary) hypertension: Secondary | ICD-10-CM | POA: Diagnosis not present

## 2014-10-19 DIAGNOSIS — Z87891 Personal history of nicotine dependence: Secondary | ICD-10-CM | POA: Insufficient documentation

## 2014-10-19 DIAGNOSIS — M542 Cervicalgia: Secondary | ICD-10-CM | POA: Diagnosis not present

## 2014-10-19 DIAGNOSIS — Z8719 Personal history of other diseases of the digestive system: Secondary | ICD-10-CM | POA: Diagnosis not present

## 2014-10-19 DIAGNOSIS — J9811 Atelectasis: Secondary | ICD-10-CM | POA: Diagnosis not present

## 2014-10-19 DIAGNOSIS — M109 Gout, unspecified: Secondary | ICD-10-CM | POA: Diagnosis not present

## 2014-10-19 DIAGNOSIS — E785 Hyperlipidemia, unspecified: Secondary | ICD-10-CM | POA: Diagnosis not present

## 2014-10-19 DIAGNOSIS — Z87440 Personal history of urinary (tract) infections: Secondary | ICD-10-CM | POA: Diagnosis not present

## 2014-10-19 DIAGNOSIS — Z7902 Long term (current) use of antithrombotics/antiplatelets: Secondary | ICD-10-CM | POA: Diagnosis not present

## 2014-10-19 DIAGNOSIS — Z79899 Other long term (current) drug therapy: Secondary | ICD-10-CM | POA: Diagnosis not present

## 2014-10-19 DIAGNOSIS — I2510 Atherosclerotic heart disease of native coronary artery without angina pectoris: Secondary | ICD-10-CM | POA: Insufficient documentation

## 2014-10-19 DIAGNOSIS — R000 Tachycardia, unspecified: Secondary | ICD-10-CM | POA: Diagnosis not present

## 2014-10-19 DIAGNOSIS — Z791 Long term (current) use of non-steroidal anti-inflammatories (NSAID): Secondary | ICD-10-CM | POA: Insufficient documentation

## 2014-10-19 DIAGNOSIS — Z7982 Long term (current) use of aspirin: Secondary | ICD-10-CM | POA: Diagnosis not present

## 2014-10-19 DIAGNOSIS — M1990 Unspecified osteoarthritis, unspecified site: Secondary | ICD-10-CM | POA: Insufficient documentation

## 2014-10-19 DIAGNOSIS — R079 Chest pain, unspecified: Secondary | ICD-10-CM | POA: Diagnosis present

## 2014-10-19 LAB — COMPREHENSIVE METABOLIC PANEL
ALK PHOS: 66 U/L (ref 39–117)
ALT: 25 U/L (ref 0–53)
ANION GAP: 6 (ref 5–15)
AST: 28 U/L (ref 0–37)
Albumin: 4.3 g/dL (ref 3.5–5.2)
BILIRUBIN TOTAL: 0.3 mg/dL (ref 0.3–1.2)
BUN: 18 mg/dL (ref 6–23)
CHLORIDE: 99 mmol/L (ref 96–112)
CO2: 24 mmol/L (ref 19–32)
Calcium: 9 mg/dL (ref 8.4–10.5)
Creatinine, Ser: 1.27 mg/dL (ref 0.50–1.35)
GFR calc non Af Amer: 55 mL/min — ABNORMAL LOW (ref >90)
GFR, EST AFRICAN AMERICAN: 63 mL/min — AB (ref >90)
GLUCOSE: 120 mg/dL — AB (ref 70–99)
POTASSIUM: 4.1 mmol/L (ref 3.5–5.1)
SODIUM: 129 mmol/L — AB (ref 135–145)
TOTAL PROTEIN: 7.6 g/dL (ref 6.0–8.3)

## 2014-10-19 LAB — TROPONIN I: Troponin I: <0.03 ng/mL (ref <0.031)

## 2014-10-19 LAB — CBC
HCT: 44.7 % (ref 39.0–52.0)
Hemoglobin: 15.6 g/dL (ref 13.0–17.0)
MCH: 33.1 pg (ref 26.0–34.0)
MCHC: 34.9 g/dL (ref 30.0–36.0)
MCV: 94.9 fL (ref 78.0–100.0)
PLATELETS: 209 10*3/uL (ref 150–400)
RBC: 4.71 MIL/uL (ref 4.22–5.81)
RDW: 12.7 % (ref 11.5–15.5)
WBC: 9.4 10*3/uL (ref 4.0–10.5)

## 2014-10-19 MED ORDER — METHOCARBAMOL 500 MG PO TABS: 500.0000 mg | ORAL_TABLET | Freq: Three times a day (TID) | ORAL | Status: DC | PRN

## 2014-10-19 MED ORDER — HYDROCODONE-ACETAMINOPHEN 5-325 MG PO TABS: 2.0000 | ORAL_TABLET | ORAL | Status: DC | PRN

## 2014-10-19 MED ORDER — CYCLOBENZAPRINE HCL 10 MG PO TABS
10.0000 mg | ORAL_TABLET | Freq: Three times a day (TID) | ORAL | Status: DC | PRN
  Administered 2014-10-19: 10 mg via ORAL
  Filled 2014-10-19: qty 1

## 2014-10-19 MED ORDER — HYDROCODONE-ACETAMINOPHEN 5-325 MG PO TABS
1.0000 | ORAL_TABLET | Freq: Once | ORAL | Status: AC
  Administered 2014-10-19: 1 via ORAL
  Filled 2014-10-19: qty 1

## 2014-10-19 NOTE — Discharge Instructions (Signed)

## 2014-10-19 NOTE — ED Notes (Signed)
Left upper ches [pain radiating to neck x 2 days,  Denies n/v  No sob more than usual

## 2014-10-19 NOTE — ED Notes (Signed)
Pt c/o left side chest pain x 2 days which radiates down left arm and into left side of neck

## 2014-10-19 NOTE — ED Provider Notes (Signed)
CSN: [id number]     Arrival date & time 10/19/14  2010 History  This chart was scribed for Gregory PorterMark Abdinasir Spadafore, MD by Chestine SporeSoijett Blue, ED Scribe. The patient was seen in room MH03/MH03 at 8:57 PM.     Chief Complaint  Patient presents with  . Chest Pain     The history is provided by the patient. No language interpreter was used.    HPI Comments: Javier GlazierJack S Kassing is a 74 y.o. male with a medical hx of HTN and CAD, who presents to the Emergency Department complaining of left upper shoulder and neck pain onset 2 days ago, on sunday. Wife states that the pain was hurting in the left shoulder area last night around 2 AM. The pain that occurred this morning felt different from the pain that caused him to get stents in the past. Laying on the left shoulder worsens the pain and the pain is relieved when he is not on the left shoulder.The pain began gradually after what the pt believes from laying on the left shoulder frequently. Pt notes that the affected area is now sore.The pain radiates left side of the pt neck. He denies numbness, n/v, SOB, and any other symptoms.  Dr. SwazilandJordan is the pt cardiologist and he was last seen 12/15 and everything was fine. Pt stents were placed 3-4 years ago and he is placed on Plavix, which he takes daily. Pt has had an intermittent fast heart rate for the past 30 days and he has not informed his cardiologist. Pt notes that he is short winded, but he doesn't believe that is anything out of the normal for his day-to-day.   Past Medical History  Diagnosis Date  . Coronary artery disease 12/2007    STATUS POST STENTING OF THE LEFT ANTERIOR DESCENDING ARTERY  . Hypertension   . Hyperlipidemia   . SBO (small bowel obstruction)     PARTIAL  . LBP (low back pain)   . Arthritis   . Gout   . RBBB (right bundle branch block)   . Partial small bowel obstruction   . Hiatal hernia   . Chronic gout   . Urinary tract infection     Escherichia coli urinary tract infection  . Dyslipidemia    . Second degree AV block, Mobitz type I    Past Surgical History  Procedure Laterality Date  . Hernia repair      INGUINAL  . Rotator cuff repair    . Cardiac catheterization     Family History  Problem Relation Age of Onset  . Emphysema Mother [phone]  . Heart attack Father [phone]   History  Substance Use Topics  . Smoking status: Former Smoker    Quit date: 08/27/1990  . Smokeless tobacco: Not on file  . Alcohol Use: No    Review of Systems  Constitutional: Negative for fever, chills, diaphoresis, appetite change and fatigue.  HENT: Negative for mouth sores, sore throat and trouble swallowing.   Eyes: Negative for visual disturbance.  Respiratory: Negative for cough, chest tightness, shortness of breath and wheezing.   Cardiovascular: Positive for chest pain.  Gastrointestinal: Negative for nausea, vomiting, abdominal pain, diarrhea and abdominal distention.  Endocrine: Negative for polydipsia, polyphagia and polyuria.  Genitourinary: Negative for dysuria, frequency and hematuria.  Musculoskeletal: Negative for gait problem.  Skin: Negative for color change, pallor and rash.  Neurological: Negative for dizziness, syncope, light-headedness, numbness and headaches.  Hematological: Does not bruise/bleed easily.  Psychiatric/Behavioral: Negative for behavioral problems  and confusion.      Allergies  Review of patient's allergies indicates no known allergies.  Home Medications   Prior to Admission medications   Medication Sig Start Date End Date Taking? Authorizing Provider  verapamil (COVERA HS) 240 MG (CO) 24 hr tablet Take 240 mg by mouth at bedtime.   Yes Historical Provider, MD  ALLOPURINOL PO Take 600 mg by mouth daily.     Historical Provider, MD  aspirin 81 MG tablet Take 81 mg by mouth daily.      Historical Provider, MD  clopidogrel (PLAVIX) 75 MG tablet Take 1 tablet (75 mg total) by mouth daily. 08/25/14   Peter M [place], MD  HYDROcodone-acetaminophen  (NORCO/VICODIN) 5-325 MG per tablet Take 2 tablets by mouth every 4 (four) hours as needed. 10/19/14   Gregory Porter, MD  indomethacin (INDOCIN) 50 MG capsule Take 1 capsule (50 mg total) by mouth 2 (two) times daily with a meal. 07/06/14   Peter M [place], MD  methocarbamol (ROBAXIN) 500 MG tablet Take 1 tablet (500 mg total) by mouth 3 (three) times daily between meals as needed. 10/19/14   Gregory Porter, MD  simvastatin (ZOCOR) 40 MG tablet Take 1 tablet (40 mg total) by mouth at bedtime. 08/25/14   Peter M [place], MD   BP 163/89 mmHg  Pulse 106  Temp(Src) 98.2 F (36.8 C) (Oral)  Resp 18  SpO2 96%  Physical Exam  Constitutional: He is oriented to person, place, and time. He appears well-developed and well-nourished. No distress.  HENT:  Head: Normocephalic.  Eyes: Conjunctivae are normal. Pupils are equal, round, and reactive to light. No scleral icterus.  Neck: Normal range of motion. Neck supple. No thyromegaly present.  Cardiovascular: Regular rhythm.  Tachycardia present.  Exam reveals no gallop and no friction rub.   No murmur heard. Pulmonary/Chest: Effort normal and breath sounds normal. No respiratory distress. He has no wheezes. He has no rales.  Abdominal: Soft. Bowel sounds are normal. He exhibits no distension. There is no tenderness. There is no rebound.  Musculoskeletal: Normal range of motion.  TTP left trapezius and left cervical paraspinal muscles. No spinous process tenderness. Full cervical ROM. Full L shoulder ROM without pain. TTP over biceps insertion. Negative impingement sign.  Neurological: He is alert and oriented to person, place, and time.  Skin: Skin is warm and dry. No rash noted.  Psychiatric: He has a normal mood and affect. His behavior is normal.  Nursing note and vitals reviewed.   ED Course  Procedures (including critical care time) DIAGNOSTIC STUDIES: Oxygen Saturation is 96% on RA, normal by my interpretation.    COORDINATION OF CARE: 9:04  PM-Discussed treatment plan which includes CXR, EKG, CBC, troponin, and CMAT with pt at bedside and pt agreed to plan.   Labs Review Labs Reviewed  COMPREHENSIVE METABOLIC PANEL - Abnormal; Notable for the following:    Sodium 129 (*)    Glucose, Bld 120 (*)    GFR calc non Af Amer 55 (*)    GFR calc Af Amer 63 (*)    All other components within normal limits  CBC  TROPONIN I    Imaging Review Dg Chest 2 View  10/19/2014   CLINICAL DATA:  Left chest pain  EXAM: CHEST  2 VIEW  COMPARISON:  02/15/2010  FINDINGS: streaky bibasilar densities compatible with atelectasis. Moderate hiatal hernia noted with an air-fluid level. Normal heart size and vascularity. No focal pneumonia, collapse or consolidation. Negative for edema, effusion  or pneumothorax. Nipple shadows noted bilaterally. Trachea is midline. Mildly tortuous aorta evident. Degenerative changes noted of the spine.  IMPRESSION: Bibasilar atelectasis.  Hiatal hernia  No superimposed acute process.   Electronically Signed   By: Judie Petit.  Shick M.D.   On: 10/19/2014 21:42     EKG Interpretation None      MDM   Final diagnoses:  Neck pain    Patient has right bundle-branch block that is different from his EKG of 2011. He has had pain for over 3 days and consistently/12 hours as normal troponin. His pain is reproducible with exam or laying of the left shoulder. He has clear lungs, normal x-ray, normal troponin. I think is appropriate for outpatient treatment with simple pain medicines muscle relaxant.  I personally performed the services described in this documentation, which was scribed in my presence. The recorded information has been reviewed and is accurate.    Gregory Porter, MD 10/19/14 [phone]

## 2014-11-21 DIAGNOSIS — J209 Acute bronchitis, unspecified: Secondary | ICD-10-CM | POA: Diagnosis not present

## 2015-01-31 ENCOUNTER — Encounter: Payer: Self-pay | Admitting: Cardiology

## 2015-01-31 ENCOUNTER — Ambulatory Visit (INDEPENDENT_AMBULATORY_CARE_PROVIDER_SITE_OTHER): Payer: Medicare Other | Admitting: Cardiology

## 2015-01-31 VITALS — BP 138/72 | HR 96 | Ht 71.0 in | Wt 238.7 lb

## 2015-01-31 DIAGNOSIS — I10 Essential (primary) hypertension: Secondary | ICD-10-CM

## 2015-01-31 DIAGNOSIS — E785 Hyperlipidemia, unspecified: Secondary | ICD-10-CM

## 2015-01-31 DIAGNOSIS — I441 Atrioventricular block, second degree: Secondary | ICD-10-CM | POA: Diagnosis not present

## 2015-01-31 DIAGNOSIS — I4819 Other persistent atrial fibrillation: Secondary | ICD-10-CM

## 2015-01-31 DIAGNOSIS — I481 Persistent atrial fibrillation: Secondary | ICD-10-CM

## 2015-01-31 DIAGNOSIS — I2510 Atherosclerotic heart disease of native coronary artery without angina pectoris: Secondary | ICD-10-CM | POA: Diagnosis not present

## 2015-01-31 DIAGNOSIS — I4510 Unspecified right bundle-branch block: Secondary | ICD-10-CM

## 2015-01-31 DIAGNOSIS — M10061 Idiopathic gout, right knee: Secondary | ICD-10-CM

## 2015-01-31 DIAGNOSIS — I4891 Unspecified atrial fibrillation: Secondary | ICD-10-CM

## 2015-01-31 DIAGNOSIS — M109 Gout, unspecified: Secondary | ICD-10-CM | POA: Insufficient documentation

## 2015-01-31 HISTORY — DX: Unspecified atrial fibrillation: I48.91

## 2015-01-31 LAB — TSH: TSH: 1.927 u[IU]/mL (ref 0.350–4.500)

## 2015-01-31 LAB — CBC WITH DIFFERENTIAL/PLATELET
BASOS ABS: 0 10*3/uL (ref 0.0–0.1)
Basophils Relative: 0 % (ref 0–1)
EOS ABS: 0 10*3/uL (ref 0.0–0.7)
Eosinophils Relative: 0 % (ref 0–5)
HEMATOCRIT: 41 % (ref 39.0–52.0)
HEMOGLOBIN: 14.1 g/dL (ref 13.0–17.0)
Lymphocytes Relative: 5 % — ABNORMAL LOW (ref 12–46)
Lymphs Abs: 1 10*3/uL (ref 0.7–4.0)
MCH: 32.3 pg (ref 26.0–34.0)
MCHC: 34.4 g/dL (ref 30.0–36.0)
MCV: 94 fL (ref 78.0–100.0)
MONOS PCT: 12 % (ref 3–12)
MPV: 9 fL (ref 8.6–12.4)
Monocytes Absolute: 2.4 10*3/uL — ABNORMAL HIGH (ref 0.1–1.0)
Neutro Abs: 16.4 10*3/uL — ABNORMAL HIGH (ref 1.7–7.7)
Neutrophils Relative %: 83 % — ABNORMAL HIGH (ref 43–77)
Platelets: 276 10*3/uL (ref 150–400)
RBC: 4.36 MIL/uL (ref 4.22–5.81)
RDW: 13.4 % (ref 11.5–15.5)
WBC: 19.8 10*3/uL — ABNORMAL HIGH (ref 4.0–10.5)

## 2015-01-31 LAB — COMPREHENSIVE METABOLIC PANEL
ALBUMIN: 3.4 g/dL — AB (ref 3.5–5.2)
ALT: 8 U/L (ref 0–53)
AST: 10 U/L (ref 0–37)
Alkaline Phosphatase: 62 U/L (ref 39–117)
BUN: 21 mg/dL (ref 6–23)
CO2: 23 meq/L (ref 19–32)
Calcium: 9.4 mg/dL (ref 8.4–10.5)
Chloride: 96 mEq/L (ref 96–112)
Creat: 1.41 mg/dL — ABNORMAL HIGH (ref 0.50–1.35)
Glucose, Bld: 90 mg/dL (ref 70–99)
Potassium: 5 mEq/L (ref 3.5–5.3)
Sodium: 130 mEq/L — ABNORMAL LOW (ref 135–145)
Total Bilirubin: 1.3 mg/dL — ABNORMAL HIGH (ref 0.2–1.2)
Total Protein: 6.2 g/dL (ref 6.0–8.3)

## 2015-01-31 LAB — URIC ACID: URIC ACID, SERUM: 4.8 mg/dL (ref 4.0–7.8)

## 2015-01-31 MED ORDER — METOPROLOL SUCCINATE ER 25 MG PO TB24: 25.0000 mg | ORAL_TABLET | Freq: Every day | ORAL | Status: DC

## 2015-01-31 MED ORDER — RIVAROXABAN 20 MG PO TABS: 20.0000 mg | ORAL_TABLET | Freq: Every day | ORAL | Status: DC

## 2015-01-31 MED ORDER — COLCHICINE 0.6 MG PO TABS: 0.6000 mg | ORAL_TABLET | Freq: Every day | ORAL | Status: DC

## 2015-01-31 MED ORDER — SIMVASTATIN 40 MG PO TABS: 40.0000 mg | ORAL_TABLET | Freq: Every day | ORAL | Status: DC

## 2015-01-31 NOTE — Progress Notes (Signed)
Gregory GlazierJack S Wheeler Date of Birth: [birth year]   History of Present Illness: Gregory Wheeler is seen for follow up of CAD.  He has a history of coronary disease and is status post stenting of the LAD with overlapping Taxus stents in 2009. His last myoview in April 2015 showed mild apical thinning, normal EF 67%.  On follow up today he reports he was seen in the ED in February for symptoms of left arm numbness and pain. This was worse with lifting his arm. Ecg was done and demonstrated tachycardiac with no clear P waves and rate 113. He was treated with a muscle relaxant.  He is seen today with his wife and grand-daughter. He complains of having no energy. His family reports he has been drinking a lot of alcohol. BP low at times. Heart rate increased. No chest pain or SOB. He also has a severe gout flair in his right knee. Mild LE edema.   Current Outpatient Prescriptions on File Prior to Visit  Medication Sig Dispense Refill  . methocarbamol (ROBAXIN) 500 MG tablet Take 1 tablet (500 mg total) by mouth 3 (three) times daily between meals as needed. 20 tablet 0  . verapamil (COVERA HS) 240 MG (CO) 24 hr tablet Take 240 mg by mouth at bedtime.     No current facility-administered medications on file prior to visit.    No Known Allergies  Past Medical History  Diagnosis Date  . Coronary artery disease 12/2007    STATUS POST STENTING OF THE LEFT ANTERIOR DESCENDING ARTERY  . Hypertension   . Hyperlipidemia   . SBO (small bowel obstruction)     PARTIAL  . LBP (low back pain)   . Arthritis   . Gout   . RBBB (right bundle branch block)   . Partial small bowel obstruction   . Hiatal hernia   . Chronic gout   . Urinary tract infection     Escherichia coli urinary tract infection  . Dyslipidemia   . Second degree AV block, Mobitz type I   . Atrial fibrillation 01/31/2015    Past Surgical History  Procedure Laterality Date  . Hernia repair      INGUINAL  . Rotator cuff repair    . Cardiac  catheterization      History  Smoking status  . Former Smoker  . Quit date: 08/27/1990  Smokeless tobacco  . Not on file    History  Alcohol Use No    Family History  Problem Relation Age of Onset  . Emphysema Mother 2271  . Heart attack Father [id number]    Review of Systems: As noted in HPI All other systems were reviewed and are negative.  Physical Exam: BP 138/72 mmHg  Pulse 96  Ht 5\' 11"  (1.803 m)  Wt 108.274 kg (238 lb 11.2 oz)  BMI 33.31 kg/m2 He is an overweight white male in no acute distress. He has no JVD or bruits. HEENT exam is unremarkable. PERRLA, sclera are clear. Oropharynx is clear. Lungs are clear. Cardiac exam reveals an irregular rate and rhythm without gallop or murmur. Abdomen is obese, soft, nontender. He has 1+ edema.  Pedal pulses are 2+ and symmetric. Right knee is red and swollen to the size of a grapefruit. He is alert and oriented x3. Cranial nerves II through XII are intact.  LABORATORY DATA: Lab Results  Component Value Date   WBC 9.4 10/19/2014   HGB 15.6 10/19/2014   HCT 44.7 10/19/2014   PLT  209 10/19/2014   GLUCOSE 120* 10/19/2014   CHOL 128 08/24/2014   TRIG 70 08/24/2014   HDL 57 08/24/2014   LDLCALC 57 08/24/2014   ALT 25 10/19/2014   AST 28 10/19/2014   NA 129* 10/19/2014   K 4.1 10/19/2014   CL 99 10/19/2014   CREATININE 1.27 10/19/2014   BUN 18 10/19/2014   CO2 24 10/19/2014   Ecg today shows atrial fibrillation with rate 124 bpm. RBBB. Occ. PVC. I have personally reviewed and interpreted this study.    Assessment / Plan: 1. Coronary disease status post stenting of the LAD in 2009 with Taxus stents. Nuclear stress test in April 2015 showed a small apical infarct with normal ejection fraction. Continue with risk factor modification.   2. Atrial fibrillation with RVR. This appears to have been present in February but was not recognized. He states he is taking verapamil but our records indicated this was stopped before.  Will  add Toprol XL 25 mg daily. [place] vasc score is 3. Recommend anticoagulation with Xarelto 20 mg daily. Stop ASA and Plavix. Recommend total abstinence from alcohol. Avoid NSAIDs. Will schedule for Echo. Check CBC, chemistry panel, and TSH today. Consider DCCV after anticoagulation for one month.  3. Hyperlipidemia  on simvastatin.   4. History of Mobitz type 1 second degree AV block.   5. HTN controlled  6. Gout. Exacerbated by recent Etoh use. Add colchicine 0.6 mg bid. Continue allopurinol. Avoid Etoh. Check uric acid level today  7. Etoh abuse. Recommend total abstinence.   I will follow up in 3 weeks.

## 2015-01-31 NOTE — Patient Instructions (Signed)
We will check lab work today  Do not take Indocin or other NSAIDs like ibuproen, motrin, naprosyn  DO NOT DRINK alcohol.  Start colchicine 0.6 mg twice a day for gout.   Start Xarelto 20 mg daily for reduction in risk of stroke (blood thinner)  Stop taking ASA and Plavix.   We will check blood work today and schedule you for an Echocardiogram  We will follow up in 2 weeks

## 2015-02-01 ENCOUNTER — Telehealth: Payer: Self-pay | Admitting: Cardiology

## 2015-02-01 DIAGNOSIS — M25569 Pain in unspecified knee: Secondary | ICD-10-CM | POA: Diagnosis not present

## 2015-02-01 DIAGNOSIS — M1A9XX1 Chronic gout, unspecified, with tophus (tophi): Secondary | ICD-10-CM | POA: Diagnosis not present

## 2015-02-01 NOTE — Telephone Encounter (Signed)
Forward to Dr Jordan  

## 2015-02-01 NOTE — Telephone Encounter (Signed)
Pt's wife called in stating that Dr. Maple HudsonYoung prescribed him Prednisone for his Gout and she wanted to make sure that him taking this would not have a negative effect on his other medications. Please f/u with her  Thanks

## 2015-02-01 NOTE — Telephone Encounter (Signed)
It is OK to take prednisone.  Tonika Eden SwazilandJordan MD, [facility]

## 2015-02-01 NOTE — Telephone Encounter (Signed)
Spoke to wife  She is aware.

## 2015-02-03 ENCOUNTER — Emergency Department (HOSPITAL_COMMUNITY): Payer: Medicare Other

## 2015-02-03 ENCOUNTER — Observation Stay (HOSPITAL_COMMUNITY)
Admission: EM | Admit: 2015-02-03 | Discharge: 2015-02-04 | Disposition: A | Discharge status: Home / Self Care | Payer: Medicare Other | Attending: Internal Medicine | Admitting: Internal Medicine

## 2015-02-03 ENCOUNTER — Encounter (HOSPITAL_COMMUNITY): Payer: Self-pay | Admitting: *Deleted

## 2015-02-03 DIAGNOSIS — I2510 Atherosclerotic heart disease of native coronary artery without angina pectoris: Secondary | ICD-10-CM | POA: Diagnosis not present

## 2015-02-03 DIAGNOSIS — M25461 Effusion, right knee: Secondary | ICD-10-CM | POA: Diagnosis not present

## 2015-02-03 DIAGNOSIS — F1020 Alcohol dependence, uncomplicated: Secondary | ICD-10-CM | POA: Diagnosis not present

## 2015-02-03 DIAGNOSIS — M7989 Other specified soft tissue disorders: Secondary | ICD-10-CM | POA: Diagnosis not present

## 2015-02-03 DIAGNOSIS — N179 Acute kidney failure, unspecified: Secondary | ICD-10-CM

## 2015-02-03 DIAGNOSIS — M109 Gout, unspecified: Secondary | ICD-10-CM | POA: Insufficient documentation

## 2015-02-03 DIAGNOSIS — I10 Essential (primary) hypertension: Secondary | ICD-10-CM | POA: Diagnosis not present

## 2015-02-03 DIAGNOSIS — I482 Chronic atrial fibrillation, unspecified: Secondary | ICD-10-CM | POA: Diagnosis present

## 2015-02-03 DIAGNOSIS — M1A0611 Idiopathic chronic gout, right knee, with tophus (tophi): Secondary | ICD-10-CM | POA: Diagnosis not present

## 2015-02-03 DIAGNOSIS — Z87891 Personal history of nicotine dependence: Secondary | ICD-10-CM | POA: Diagnosis not present

## 2015-02-03 DIAGNOSIS — M71169 Other infective bursitis, unspecified knee: Secondary | ICD-10-CM | POA: Insufficient documentation

## 2015-02-03 DIAGNOSIS — M25469 Effusion, unspecified knee: Secondary | ICD-10-CM | POA: Diagnosis present

## 2015-02-03 DIAGNOSIS — F1010 Alcohol abuse, uncomplicated: Secondary | ICD-10-CM

## 2015-02-03 DIAGNOSIS — M71161 Other infective bursitis, right knee: Secondary | ICD-10-CM

## 2015-02-03 DIAGNOSIS — Z7982 Long term (current) use of aspirin: Secondary | ICD-10-CM | POA: Diagnosis not present

## 2015-02-03 DIAGNOSIS — Z7902 Long term (current) use of antithrombotics/antiplatelets: Secondary | ICD-10-CM | POA: Insufficient documentation

## 2015-02-03 DIAGNOSIS — M7041 Prepatellar bursitis, right knee: Secondary | ICD-10-CM | POA: Diagnosis not present

## 2015-02-03 DIAGNOSIS — M25561 Pain in right knee: Secondary | ICD-10-CM | POA: Diagnosis not present

## 2015-02-03 LAB — CBC WITH DIFFERENTIAL/PLATELET
Basophils Absolute: 0 10*3/uL (ref 0.0–0.1)
Basophils Relative: 0 % (ref 0–1)
Eosinophils Absolute: 0 10*3/uL (ref 0.0–0.7)
Eosinophils Relative: 0 % (ref 0–5)
HCT: 41 % (ref 39.0–52.0)
Hemoglobin: 14.2 g/dL (ref 13.0–17.0)
Lymphocytes Relative: 5 % — ABNORMAL LOW (ref 12–46)
Lymphs Abs: 1.1 10*3/uL (ref 0.7–4.0)
MCH: 32.9 pg (ref 26.0–34.0)
MCHC: 34.6 g/dL (ref 30.0–36.0)
MCV: 94.9 fL (ref 78.0–100.0)
Monocytes Absolute: 2.1 10*3/uL — ABNORMAL HIGH (ref 0.1–1.0)
Monocytes Relative: 9 % (ref 3–12)
Neutro Abs: 21.5 10*3/uL — ABNORMAL HIGH (ref 1.7–7.7)
Neutrophils Relative %: 86 % — ABNORMAL HIGH (ref 43–77)
Platelets: 314 10*3/uL (ref 150–400)
RBC: 4.32 MIL/uL (ref 4.22–5.81)
RDW: 13.1 % (ref 11.5–15.5)
WBC: 24.8 10*3/uL — ABNORMAL HIGH (ref 4.0–10.5)

## 2015-02-03 LAB — COMPREHENSIVE METABOLIC PANEL
ALT: 26 U/L (ref 17–63)
AST: 24 U/L (ref 15–41)
Albumin: 2.9 g/dL — ABNORMAL LOW (ref 3.5–5.0)
Alkaline Phosphatase: 70 U/L (ref 38–126)
Anion gap: 12 (ref 5–15)
BUN: 31 mg/dL — ABNORMAL HIGH (ref 6–20)
CO2: 22 mmol/L (ref 22–32)
Calcium: 9.4 mg/dL (ref 8.9–10.3)
Chloride: 100 mmol/L — ABNORMAL LOW (ref 101–111)
Creatinine, Ser: 2.13 mg/dL — ABNORMAL HIGH (ref 0.61–1.24)
GFR calc Af Amer: 34 mL/min — ABNORMAL LOW (ref >60)
GFR calc non Af Amer: 29 mL/min — ABNORMAL LOW (ref >60)
Glucose, Bld: 157 mg/dL — ABNORMAL HIGH (ref 65–99)
Potassium: 4.6 mmol/L (ref 3.5–5.1)
Sodium: 134 mmol/L — ABNORMAL LOW (ref 135–145)
Total Bilirubin: 0.3 mg/dL (ref 0.3–1.2)
Total Protein: 6.8 g/dL (ref 6.5–8.1)

## 2015-02-03 MED ORDER — THIAMINE HCL 100 MG/ML IJ SOLN
100.0000 mg | Freq: Every day | INTRAMUSCULAR | Status: DC
  Administered 2015-02-04: 100 mg via INTRAVENOUS
  Filled 2015-02-03: qty 2

## 2015-02-03 MED ORDER — VERAPAMIL HCL ER 240 MG PO TBCR
240.0000 mg | EXTENDED_RELEASE_TABLET | Freq: Every day | ORAL | Status: DC
  Administered 2015-02-04: 240 mg via ORAL
  Filled 2015-02-03: qty 1

## 2015-02-03 MED ORDER — ADULT MULTIVITAMIN W/MINERALS CH
1.0000 | ORAL_TABLET | Freq: Every day | ORAL | Status: DC
  Administered 2015-02-04: 1 via ORAL
  Filled 2015-02-03: qty 1

## 2015-02-03 MED ORDER — LORAZEPAM 2 MG/ML IJ SOLN: 1.0000 mg | Freq: Four times a day (QID) | INTRAMUSCULAR | Status: DC | PRN

## 2015-02-03 MED ORDER — CLOPIDOGREL BISULFATE 75 MG PO TABS
75.0000 mg | ORAL_TABLET | Freq: Every day | ORAL | Status: DC
  Administered 2015-02-04: 75 mg via ORAL
  Filled 2015-02-03: qty 1

## 2015-02-03 MED ORDER — ACETAMINOPHEN 650 MG RE SUPP: 650.0000 mg | Freq: Four times a day (QID) | RECTAL | Status: DC | PRN

## 2015-02-03 MED ORDER — ACETAMINOPHEN 325 MG PO TABS: 650.0000 mg | ORAL_TABLET | Freq: Four times a day (QID) | ORAL | Status: DC | PRN

## 2015-02-03 MED ORDER — ONDANSETRON HCL 4 MG/2ML IJ SOLN: 4.0000 mg | Freq: Four times a day (QID) | INTRAMUSCULAR | Status: DC | PRN

## 2015-02-03 MED ORDER — COLCHICINE 0.6 MG PO TABS
0.6000 mg | ORAL_TABLET | Freq: Every day | ORAL | Status: DC
  Administered 2015-02-04: 0.6 mg via ORAL
  Filled 2015-02-03: qty 1

## 2015-02-03 MED ORDER — PREDNISONE 5 MG PO TABS: 5.0000 mg | ORAL_TABLET | Freq: Every day | ORAL | Status: DC

## 2015-02-03 MED ORDER — LIDOCAINE-EPINEPHRINE 1 %-1:100000 IJ SOLN
10.0000 mL | Freq: Once | INTRAMUSCULAR | Status: AC
  Administered 2015-02-03: 10 mL
  Filled 2015-02-03: qty 1

## 2015-02-03 MED ORDER — LORAZEPAM 1 MG PO TABS: 0.0000 mg | ORAL_TABLET | Freq: Two times a day (BID) | ORAL | Status: DC

## 2015-02-03 MED ORDER — PREDNISONE 5 MG PO TABS: 15.0000 mg | ORAL_TABLET | Freq: Every day | ORAL | Status: DC

## 2015-02-03 MED ORDER — VITAMIN B-1 100 MG PO TABS
100.0000 mg | ORAL_TABLET | Freq: Every day | ORAL | Status: DC
  Filled 2015-02-03: qty 1

## 2015-02-03 MED ORDER — PREDNISONE 10 MG PO TABS: 10.0000 mg | ORAL_TABLET | Freq: Every day | ORAL | Status: DC

## 2015-02-03 MED ORDER — PREDNISONE 5 MG PO TABS: 25.0000 mg | ORAL_TABLET | Freq: Every day | ORAL | Status: DC

## 2015-02-03 MED ORDER — OXYCODONE HCL 5 MG PO TABS
5.0000 mg | ORAL_TABLET | ORAL | Status: DC | PRN
  Administered 2015-02-04: 5 mg via ORAL
  Filled 2015-02-03: qty 1

## 2015-02-03 MED ORDER — ONDANSETRON HCL 4 MG PO TABS: 4.0000 mg | ORAL_TABLET | Freq: Four times a day (QID) | ORAL | Status: DC | PRN

## 2015-02-03 MED ORDER — VANCOMYCIN HCL 10 G IV SOLR
1750.0000 mg | Freq: Once | INTRAVENOUS | Status: AC
  Administered 2015-02-03: 1750 mg via INTRAVENOUS
  Filled 2015-02-03: qty 1750

## 2015-02-03 MED ORDER — FOLIC ACID 1 MG PO TABS
1.0000 mg | ORAL_TABLET | Freq: Every day | ORAL | Status: DC
  Administered 2015-02-04: 1 mg via ORAL
  Filled 2015-02-03: qty 1

## 2015-02-03 MED ORDER — ZOLPIDEM TARTRATE 5 MG PO TABS
5.0000 mg | ORAL_TABLET | Freq: Once | ORAL | Status: AC
Start: 2015-02-03 — End: 2015-02-04
  Administered 2015-02-04: 5 mg via ORAL
  Filled 2015-02-03: qty 1

## 2015-02-03 MED ORDER — SODIUM CHLORIDE 0.9 % IV SOLN
INTRAVENOUS | Status: DC
  Administered 2015-02-04: 01:00:00 via INTRAVENOUS

## 2015-02-03 MED ORDER — VANCOMYCIN HCL 10 G IV SOLR
1250.0000 mg | INTRAVENOUS | Status: DC
  Filled 2015-02-03: qty 1250

## 2015-02-03 MED ORDER — LORAZEPAM 1 MG PO TABS: 1.0000 mg | ORAL_TABLET | Freq: Four times a day (QID) | ORAL | Status: DC | PRN

## 2015-02-03 MED ORDER — ALLOPURINOL 300 MG PO TABS
300.0000 mg | ORAL_TABLET | Freq: Every day | ORAL | Status: DC
  Administered 2015-02-04: 300 mg via ORAL
  Filled 2015-02-03: qty 1

## 2015-02-03 MED ORDER — PREDNISONE 20 MG PO TABS: 20.0000 mg | ORAL_TABLET | Freq: Every day | ORAL | Status: DC

## 2015-02-03 MED ORDER — METOPROLOL SUCCINATE ER 25 MG PO TB24
25.0000 mg | ORAL_TABLET | Freq: Every day | ORAL | Status: DC
  Administered 2015-02-04: 25 mg via ORAL
  Filled 2015-02-03: qty 1

## 2015-02-03 MED ORDER — SODIUM CHLORIDE 0.9 % IV BOLUS (SEPSIS)
1000.0000 mL | Freq: Once | INTRAVENOUS | Status: AC
  Administered 2015-02-03: 1000 mL via INTRAVENOUS

## 2015-02-03 MED ORDER — PREDNISONE 10 MG PO TABS
15.0000 mg | ORAL_TABLET | Freq: Once | ORAL | Status: AC
Start: 2015-02-03 — End: 2015-02-04
  Administered 2015-02-04: 15 mg via ORAL
  Filled 2015-02-03 (×2): qty 1

## 2015-02-03 MED ORDER — PREDNISONE 5 MG (48) PO TBPK: 5.0000 mg | ORAL_TABLET | Freq: Every day | ORAL | Status: DC

## 2015-02-03 MED ORDER — RIVAROXABAN 20 MG PO TABS: 20.0000 mg | ORAL_TABLET | Freq: Every day | ORAL | Status: DC

## 2015-02-03 MED ORDER — SIMVASTATIN 40 MG PO TABS: 40.0000 mg | ORAL_TABLET | Freq: Every day | ORAL | Status: DC

## 2015-02-03 MED ORDER — LORAZEPAM 1 MG PO TABS
0.0000 mg | ORAL_TABLET | Freq: Four times a day (QID) | ORAL | Status: DC
  Administered 2015-02-04: 1 mg via ORAL
  Filled 2015-02-03: qty 1

## 2015-02-03 NOTE — Consult Note (Signed)
Reason for Consult:  Right knee swelling with prepatellar bursitis vs gout vs infection Referring Physician: EDP  Gregory Wheeler is an 74 y.o. male.  HPI: 74 yo male with a 3 - 4 day history of acute right knee swelling on top of chronic swelling from gout.  He has a history significant for gout and apparently has his right knee aspirated on Monday and minimal fluid came out according to him.  Apparently it did show a questionable infection.  With worsening swelling, he can to the ED.  Ortho is called to assess his right knee.  He does report pain with his right knee.  He has also recently been started on xarelto.  He denies fever and chills and also denies chest pain, SOB, or any GI/GU complaints.  Past Medical History  Diagnosis Date  . Coronary artery disease 12/2007    STATUS POST STENTING OF THE LEFT ANTERIOR DESCENDING ARTERY  . Hypertension   . Hyperlipidemia   . SBO (small bowel obstruction)     PARTIAL  . LBP (low back pain)   . Arthritis   . Gout   . RBBB (right bundle branch block)   . Partial small bowel obstruction   . Hiatal hernia   . Chronic gout   . Urinary tract infection     Escherichia coli urinary tract infection  . Dyslipidemia   . Second degree AV block, Mobitz type I   . Atrial fibrillation 01/31/2015    Past Surgical History  Procedure Laterality Date  . Hernia repair      INGUINAL  . Rotator cuff repair    . Cardiac catheterization      Family History  Problem Relation Age of Onset  . Emphysema Mother 44  . Heart attack Father 57    Social History:  reports that he quit smoking about 24 years ago. He does not have any smokeless tobacco history on file. He reports that he does not drink alcohol or use illicit drugs.  Allergies: No Known Allergies  Medications: I have reviewed the patient's current medications.  Results for orders placed or performed during the hospital encounter of 02/03/15 (from the past 48 hour(s))  Comprehensive metabolic  panel     Status: Abnormal   Collection Time: 02/03/15  6:51 PM  Result Value Ref Range   Sodium 134 (L) 135 - 145 mmol/L   Potassium 4.6 3.5 - 5.1 mmol/L   Chloride 100 (L) 101 - 111 mmol/L   CO2 22 22 - 32 mmol/L   Glucose, Bld 157 (H) 65 - 99 mg/dL   BUN 31 (H) 6 - 20 mg/dL   Creatinine, Ser 2.13 (H) 0.61 - 1.24 mg/dL   Calcium 9.4 8.9 - 10.3 mg/dL   Total Protein 6.8 6.5 - 8.1 g/dL   Albumin 2.9 (L) 3.5 - 5.0 g/dL   AST 24 15 - 41 U/L   ALT 26 17 - 63 U/L   Alkaline Phosphatase 70 38 - 126 U/L   Total Bilirubin 0.3 0.3 - 1.2 mg/dL   GFR calc non Af Amer 29 (L) >60 mL/min   GFR calc Af Amer 34 (L) >60 mL/min    Comment: (NOTE) The eGFR has been calculated using the CKD EPI equation. This calculation has not been validated in all clinical situations. eGFR's persistently <60 mL/min signify possible Chronic Kidney Disease.    Anion gap 12 5 - 15  CBC with Differential     Status: Abnormal   Collection  Time: 02/03/15  6:51 PM  Result Value Ref Range   WBC 24.8 (H) 4.0 - 10.5 K/uL   RBC 4.32 4.22 - 5.81 MIL/uL   Hemoglobin 14.2 13.0 - 17.0 g/dL   HCT 41.0 39.0 - 52.0 %   MCV 94.9 78.0 - 100.0 fL   MCH 32.9 26.0 - 34.0 pg   MCHC 34.6 30.0 - 36.0 g/dL   RDW 13.1 11.5 - 15.5 %   Platelets 314 150 - 400 K/uL   Neutrophils Relative % 86 (H) 43 - 77 %   Neutro Abs 21.5 (H) 1.7 - 7.7 K/uL   Lymphocytes Relative 5 (L) 12 - 46 %   Lymphs Abs 1.1 0.7 - 4.0 K/uL   Monocytes Relative 9 3 - 12 %   Monocytes Absolute 2.1 (H) 0.1 - 1.0 K/uL   Eosinophils Relative 0 0 - 5 %   Eosinophils Absolute 0.0 0.0 - 0.7 K/uL   Basophils Relative 0 0 - 1 %   Basophils Absolute 0.0 0.0 - 0.1 K/uL    Dg Knee Complete 4 Views Right  02/03/2015   CLINICAL DATA:  RIGHT knee pain and swelling. Redness for 1 week. No injury. Fluid aspirated 02/01/2015.  EXAM: RIGHT KNEE - COMPLETE 4+ VIEW  COMPARISON:  None.  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 Medial and lateral joint spaces are relatively preserved although there is mild medial compartment osteoarthritis. Calcifications are present throughout the knee joint, also likely from gout and/or CPPD. Atherosclerosis. No fracture.  IMPRESSION: No acute osseous abnormality. Chronic changes compatible with gout, most pronounced in the prepatellar soft tissues and in the knee joint.   Electronically Signed   By: Dereck Ligas M.D.   On: 02/03/2015 20:32    ROS Blood pressure 107/69, pulse 112, temperature 98.4 F (36.9 C), temperature source Oral, resp. rate 20, SpO2 96 %. Physical Exam  Constitutional: He is oriented to person, place, and time. He appears well-developed and well-nourished.  HENT:  Head: Normocephalic and atraumatic.  Neck: Normal range of motion. Neck supple.  Cardiovascular: Tachycardia present.   Respiratory: Effort normal and breath sounds normal.  GI: Soft. Bowel sounds are normal.  Musculoskeletal:       Right knee: He exhibits deformity and erythema. Tenderness found.       Legs: Neurological: He is alert and oriented to person, place, and time.  Skin: Skin is warm.  Psychiatric: He has a normal mood and affect. His behavior is normal.    Assessment/Plan: Right knee with significant pre-patella swelling most consistent with bursitis likely from gout 1)  At the bedside I cleaned his right knee with betadine and alcohol and then injected 10 cc of plain marcaine into the skin laterally.  I then took a #10 blade and made a 2 cm incision.  I was able to irrigated out the prepatellar area with a liter of normal saline solution and found copious amounts of fluid consistent with gout.  The was large gout crystals and gouty tophi.  There was no significant smell and this appears most like gout.  Certainly, there could be a small component of infection.  I then placed xeroform and a well-padded dressing.   There is nothing else I would recommend from an ortho standpoint other than the bedside irrigation I performed.Marland Kitchen  He does probably need oral antibiotics such as doxycycline for 2 weeks.  He tolerated this very well  and he and his family felt much better after I did the procedure.  I agree with 24-48 hours of IV antibiotics given his high WBC and can resume Xarelto.  Gregory Wheeler 02/03/2015, 9:39 PM

## 2015-02-03 NOTE — Discharge Instructions (Signed)
Leave your current right knee dressing on for the next 3 days. In 3 days you can remove your dressings and get your right knee wet in the shower. In 3 days, place a daily band-aid over your incision daily after your shower.

## 2015-02-03 NOTE — ED Provider Notes (Signed)
CSN: [id number]     Arrival date & time 02/03/15  1828 History   First MD Initiated Contact with Patient 02/03/15 2021     Chief Complaint  Patient presents with  . Knee Pain  . Joint Swelling     (Consider location/radiation/quality/duration/timing/severity/associated sxs/prior Treatment) HPI    74 y.o. male with history of CAD status post stenting, atrial fibrillation, hypertension, alcoholism, gout was recently experiencing increasing swelling of his right knee and was placed on prednisone by patient's rheumatologist. Patient states his swelling started last week and had gone to his rheumatologist who had aspirated his knee and was placed on prednisone. Today patient's rheumatology office had called him stating that cultures grew staph aureus and advised to come to the ER. No fever or chills.   Past Medical History  Diagnosis Date  . Coronary artery disease 12/2007    STATUS POST STENTING OF THE LEFT ANTERIOR DESCENDING ARTERY  . Hypertension   . Hyperlipidemia   . SBO (small bowel obstruction)     PARTIAL  . LBP (low back pain)   . Arthritis   . Gout   . RBBB (right bundle branch block)   . Partial small bowel obstruction   . Hiatal hernia   . Chronic gout   . Urinary tract infection     Escherichia coli urinary tract infection  . Dyslipidemia   . Second degree AV block, Mobitz type I   . Atrial fibrillation 01/31/2015   Past Surgical History  Procedure Laterality Date  . Hernia repair      INGUINAL  . Rotator cuff repair    . Cardiac catheterization     Family History  Problem Relation Age of Onset  . Emphysema Mother 84  . Heart attack Father 59   History  Substance Use Topics  . Smoking status: Former Smoker    Quit date: 08/27/1990  . Smokeless tobacco: Not on file  . Alcohol Use: No    Review of Systems  All systems reviewed and negative, other than as noted in HPI.   Allergies  Review of patient's allergies indicates no known allergies.  Home  Medications   Prior to Admission medications   Medication Sig Start Date End Date Taking? Authorizing Provider  allopurinol (ZYLOPRIM) 300 MG tablet Take 300 mg by mouth daily.   Yes Historical Provider, MD  Aspirin-Acetaminophen-Caffeine (GOODY HEADACHE PO) Take 2 packets by mouth daily as needed (general pain).   Yes Historical Provider, MD  clopidogrel (PLAVIX) 75 MG tablet Take 75 mg by mouth daily. 11/08/14  Yes Historical Provider, MD  colchicine 0.6 MG tablet Take 1 tablet (0.6 mg total) by mouth daily. 01/31/15  Yes Peter M [place], MD  indomethacin (INDOCIN) 50 MG capsule Take 50 mg by mouth 2 (two) times daily. 12/18/14  Yes Historical Provider, MD  losartan (COZAAR) 25 MG tablet Take 1 tablet by mouth daily. Take 1 tab daily 01/22/15  Yes Historical Provider, MD  metoprolol succinate (TOPROL XL) 25 MG 24 hr tablet Take 1 tablet (25 mg total) by mouth daily. 01/31/15  Yes Peter M [place], MD  predniSONE (STERAPRED UNI-PAK 48 TAB) 5 MG (48) TBPK tablet Take 5 mg by mouth daily.   Yes Historical Provider, MD  rivaroxaban (XARELTO) 20 MG TABS tablet Take 1 tablet (20 mg total) by mouth daily with supper. Patient taking differently: Take 20 mg by mouth daily.  01/31/15  Yes Peter M [place], MD  simvastatin (ZOCOR) 40 MG tablet Take 1 tablet (  40 mg total) by mouth at bedtime. Patient taking differently: Take 40 mg by mouth daily.  01/31/15  Yes Peter M [place], MD  verapamil (COVERA HS) 240 MG (CO) 24 hr tablet Take 240 mg by mouth daily.    Yes Historical Provider, MD   BP 107/69 mmHg  Pulse 112  Temp(Src) 98.4 F (36.9 C) (Oral)  Resp 20  SpO2 96% Physical Exam  Constitutional: He appears well-developed and well-nourished. No distress.  HENT:  Head: Normocephalic and atraumatic.  Eyes: Conjunctivae are normal. Right eye exhibits no discharge. Left eye exhibits no discharge.  Neck: Neck supple.  Cardiovascular: Normal rate, regular rhythm and normal heart sounds.  Exam reveals no gallop and no  friction rub.   No murmur heard. Pulmonary/Chest: Effort normal and breath sounds normal. No respiratory distress.  Abdominal: Soft. He exhibits no distension. There is no tenderness.  Musculoskeletal:  Swelling, erythema, increased warmth, tenderness anterior R knee consistent with prepatellar bursitis. Can actively range knee although end movements with increased pain. NVI distally. Chronic appearing tophaceous changes R second toe.   Neurological: He is alert.  Skin: Skin is warm and dry.  Psychiatric: He has a normal mood and affect. His behavior is normal. Thought content normal.  Nursing note and vitals reviewed.   ED Course  Procedures (including critical care time) Labs Review Labs Reviewed  COMPREHENSIVE METABOLIC PANEL - Abnormal; Notable for the following:    Sodium 134 (*)    Chloride 100 (*)    Glucose, Bld 157 (*)    BUN 31 (*)    Creatinine, Ser 2.13 (*)    Albumin 2.9 (*)    GFR calc non Af Amer 29 (*)    GFR calc Af Amer 34 (*)    All other components within normal limits  CBC WITH DIFFERENTIAL/PLATELET - Abnormal; Notable for the following:    WBC 24.8 (*)    Neutrophils Relative % 86 (*)    Neutro Abs 21.5 (*)    Lymphocytes Relative 5 (*)    Monocytes Absolute 2.1 (*)    All other components within normal limits  CULTURE, BLOOD (ROUTINE X 2)  CULTURE, BLOOD (ROUTINE X 2)    Imaging Review Dg Knee Complete 4 Views Right  02/03/2015   CLINICAL DATA:  RIGHT knee pain and swelling. Redness for 1 week. No injury. Fluid aspirated 02/01/2015.  EXAM: RIGHT KNEE - COMPLETE 4+ VIEW  COMPARISON:  None.  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 Chondrocalcinosis of the menisci. Medial and lateral joint spaces are relatively preserved although there is mild medial compartment osteoarthritis. Calcifications are present throughout the knee joint, also likely from gout and/or CPPD.  Atherosclerosis. No fracture.  IMPRESSION: No acute osseous abnormality. Chronic changes compatible with gout, most pronounced in the prepatellar soft tissues and in the knee joint.   Electronically Signed   By: Andreas Newport M.D.   On: 02/03/2015 20:32     EKG Interpretation None      MDM   Final diagnoses:  Infected prepatellar bursa, right  AKI (acute kidney injury)    74yM with what is clinically an infected R prepatellar bursitis. Aspiration on Tuesday with many WBC and growth of staph aureus. Has been treated in the interim for possible gout w/o improvement. WBC 24.8. On prednisone, but was also significantly elevated at 19,800 on 6/6 prior to starting them.  Will place IV. Start IV abx. Will obtain blood  cultures although he is afebrile and clinically appears well aside from knee. Orthopedic consultation. AKI. Has been taking allopurinol and indomethacin for gout. IVF. Anticipate medicine admission.     Raeford Razor, MD 02/09/15 [phone]

## 2015-02-03 NOTE — Progress Notes (Signed)
ANTIBIOTIC CONSULT NOTE - INITIAL  Pharmacy Consult for Vancomycin Indication: wound infection  No Known Allergies  Patient Measurements:   Adjusted Body Weight:   Vital Signs: Temp: 98.4 F (36.9 C) (06/09 1850) Temp Source: Oral (06/09 1850) BP: 107/69 mmHg (06/09 1850) Pulse Rate: 112 (06/09 1850) Intake/Output from previous day:   Intake/Output from this shift:    Labs:  Recent Labs  02/03/15 1851  WBC 24.8*  HGB 14.2  PLT 314  CREATININE 2.13*   Estimated Creatinine Clearance: 38.7 mL/min (by C-G formula based on Cr of 2.13). No results for input(s): VANCOTROUGH, VANCOPEAK, VANCORANDOM, GENTTROUGH, GENTPEAK, GENTRANDOM, TOBRATROUGH, TOBRAPEAK, TOBRARND, AMIKACINPEAK, AMIKACINTROU, AMIKACIN in the last 72 hours.   Microbiology: No results found for this or any previous visit (from the past 720 hour(s)).  Medical History: Past Medical History  Diagnosis Date  . Coronary artery disease 12/2007    STATUS POST STENTING OF THE LEFT ANTERIOR DESCENDING ARTERY  . Hypertension   . Hyperlipidemia   . SBO (small bowel obstruction)     PARTIAL  . LBP (low back pain)   . Arthritis   . Gout   . RBBB (right bundle branch block)   . Partial small bowel obstruction   . Hiatal hernia   . Chronic gout   . Urinary tract infection     Escherichia coli urinary tract infection  . Dyslipidemia   . Second degree AV block, Mobitz type I   . Atrial fibrillation 01/31/2015    Medications:   (Not in a hospital admission) Scheduled:   Infusions:  . vancomycin     Assessment: 74yo male with history of CAD, RBBB, Afib, and partial SBO presents with knee pain and joint swelling. Pharmacy is consulted to dose vancomycin for wound infection. Pt is afebrile, WBC 24.8, sCr 2.13 (up from 1.4 on 6/6).  Goal of Therapy:  Vancomycin trough level 15-20 mcg/ml  Plan:  Vancomycin 1750mg  IV load followed by 1250mg  q24h  Measure antibiotic drug levels at steady state Follow up  culture results, renal function, and clinical course  Arlean Hopping. Newman Pies, PharmD Clinical Pharmacist Pager [phone] 02/03/2015,9:00 PM

## 2015-02-03 NOTE — ED Notes (Signed)
Pt c/o right knee pain and swelling. Pt presents with a large amount of swelling with redness and warm to the touch. Pt is able to ambulate but is very uncomfortable. Pt states his PCP tried to pulled fluid off his knee on Tuesday. Pt denies fever, chills, n/v

## 2015-02-03 NOTE — ED Notes (Signed)
Pt's doctors office faxed in results of knee fluid culture. Culture showed heavy growth of staphylococcus.

## 2015-02-03 NOTE — H&P (Addendum)
[facility] History and Physical  ANSHUL FUSON NAT:[id number] DOB: Feb 10, 1941 DOA: 02/03/2015  Referring physician: Dr.Kohut. PCP: Pamelia Hoit, MD  Specialists: Dr.Jordan. Cardiologist.  Chief Complaint: Right knee swelling.  HPI: Gregory Wheeler is a 74 y.o. male with history of CAD status post stenting, atrial fibrillation, hypertension, alcoholism, gout was recently experiencing increasing swelling of his right knee and was placed on prednisone by patient's rheumatologist. Patient states his swelling started last week and had gone to his rheumatologist who had aspirated his knee and was placed on prednisone. Today patient's rheumatology office had called him stating that cultures grew staph aureus and advised to come to the ER. In the ER patient had a knee aspiration done by Dr. Magnus Ivan on call orthopedic surgeon. Patient was started on empiric antibiotics after blood cultures obtained and admitted for further management. Patient otherwise denies any fever chills chest pain shortness of breath nausea vomiting or abdominal pain or diarrhea.   Review of Systems: As presented in the history of presenting illness, rest negative.  Past Medical History  Diagnosis Date  . Coronary artery disease 12/2007    STATUS POST STENTING OF THE LEFT ANTERIOR DESCENDING ARTERY  . Hypertension   . Hyperlipidemia   . SBO (small bowel obstruction)     PARTIAL  . LBP (low back pain)   . Arthritis   . Gout   . RBBB (right bundle branch block)   . Partial small bowel obstruction   . Hiatal hernia   . Chronic gout   . Urinary tract infection     Escherichia coli urinary tract infection  . Dyslipidemia   . Second degree AV block, Mobitz type I   . Atrial fibrillation 01/31/2015   Past Surgical History  Procedure Laterality Date  . Hernia repair      INGUINAL  . Rotator cuff repair    . Cardiac catheterization     Social History:  reports that he quit smoking about 24 years ago. He does  not have any smokeless tobacco history on file. He reports that he drinks alcohol. He reports that he does not use illicit drugs. Where does patient live home. Can patient participate in ADLs? Yes.  No Known Allergies  Family History:  Family History  Problem Relation Age of Onset  . Emphysema Mother 12  . Heart attack Father 84      Prior to Admission medications   Medication Sig Start Date End Date Taking? Authorizing Provider  allopurinol (ZYLOPRIM) 300 MG tablet Take 300 mg by mouth daily.   Yes Historical Provider, MD  Aspirin-Acetaminophen-Caffeine (GOODY HEADACHE PO) Take 2 packets by mouth daily as needed (general pain).   Yes Historical Provider, MD  clopidogrel (PLAVIX) 75 MG tablet Take 75 mg by mouth daily. 11/08/14  Yes Historical Provider, MD  colchicine 0.6 MG tablet Take 1 tablet (0.6 mg total) by mouth daily. 01/31/15  Yes Peter M [place], MD  indomethacin (INDOCIN) 50 MG capsule Take 50 mg by mouth 2 (two) times daily. 12/18/14  Yes Historical Provider, MD  losartan (COZAAR) 25 MG tablet Take 1 tablet by mouth daily. Take 1 tab daily 01/22/15  Yes Historical Provider, MD  metoprolol succinate (TOPROL XL) 25 MG 24 hr tablet Take 1 tablet (25 mg total) by mouth daily. 01/31/15  Yes Peter M [place], MD  predniSONE (STERAPRED UNI-PAK 48 TAB) 5 MG (48) TBPK tablet Take 5 mg by mouth daily.   Yes Historical Provider, MD  rivaroxaban (XARELTO) 20 MG  TABS tablet Take 1 tablet (20 mg total) by mouth daily with supper. Patient taking differently: Take 20 mg by mouth daily.  01/31/15  Yes Peter M [place], MD  simvastatin (ZOCOR) 40 MG tablet Take 1 tablet (40 mg total) by mouth at bedtime. Patient taking differently: Take 40 mg by mouth daily.  01/31/15  Yes Peter M [place], MD  verapamil (COVERA HS) 240 MG (CO) 24 hr tablet Take 240 mg by mouth daily.    Yes Historical Provider, MD    Physical Exam: Filed Vitals:   02/03/15 2115 02/03/15 2130 02/03/15 2145 02/03/15 2222  BP: 132/95 124/80  140/83 140/79  Pulse:   40 70  Temp:    98.1 F (36.7 C)  TempSrc:    Oral  Resp: SpO2:   96% 95%     General:  Moderately built and nourished.  Eyes: Anicteric no pallor.  ENT: No discharge from ears eyes nose and mouth.  Neck: No mass felt.  Cardiovascular: S1 and S2 heard.  Respiratory: No rhonchi or crepitations.  Abdomen: Soft nontender bowel sounds present.  Skin: There is mild erythema of the right second toe from gout. No active inflammation seen.  Musculoskeletal: Right knee has been dressed.  Psychiatric: Appears normal.  Neurologic: Alert awake oriented to time place and person. Moves all extremities.  Labs on Admission:  Basic Metabolic Panel:  Recent Labs Lab 01/31/15 1118 02/03/15 1851  NA 130* 134*  K 5.0 4.6  CL 96 100*  CO2 23 22  GLUCOSE 90 157*  BUN 21 31*  CREATININE 1.41* 2.13*  CALCIUM 9.4 9.4   Liver Function Tests:  Recent Labs Lab 01/31/15 1118 02/03/15 1851  AST 10 24  ALT 8 26  ALKPHOS 62 70  BILITOT 1.3* 0.3  PROT 6.2 6.8  ALBUMIN 3.4* 2.9*   No results for input(s): LIPASE, AMYLASE in the last 168 hours. No results for input(s): AMMONIA in the last 168 hours. CBC:  Recent Labs Lab 01/31/15 1118 02/03/15 1851  WBC 19.8* 24.8*  NEUTROABS 16.4* 21.5*  HGB 14.1 14.2  HCT 41.0 41.0  MCV 94.0 94.9  PLT 276 314   Cardiac Enzymes: No results for input(s): CKTOTAL, CKMB, CKMBINDEX, TROPONINI in the last 168 hours.  BNP (last 3 results) No results for input(s): BNP in the last 8760 hours.  ProBNP (last 3 results) No results for input(s): PROBNP in the last 8760 hours.  CBG: No results for input(s): GLUCAP in the last 168 hours.  Radiological Exams on Admission: Dg Knee Complete 4 Views Right  02/03/2015   CLINICAL DATA:  RIGHT knee pain and swelling. Redness for 1 week. No injury. Fluid aspirated 02/01/2015.  EXAM: RIGHT KNEE - COMPLETE 4+ VIEW  COMPARISON:  None.  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 Chondrocalcinosis of the menisci. Medial and lateral joint spaces are relatively preserved although there is mild medial compartment osteoarthritis. Calcifications are present throughout the knee joint, also likely from gout and/or CPPD. Atherosclerosis. No fracture.  IMPRESSION: No acute osseous abnormality. Chronic changes compatible with gout, most pronounced in the prepatellar soft tissues and in the knee joint.   Electronically Signed   By: Andreas Newport M.D.   On: 02/03/2015 20:32     Assessment/Plan Principal Problem:   Effusion of right knee Active Problems:   Coronary artery disease   Hypertension   AKI (acute kidney injury)   Chronic  atrial fibrillation   Knee effusion   1. Right knee effusion - I have discussed with Dr. Magnus Ivan orthopedic surgeon who at this time has aspirated the knee and feels that patient's knee swelling is most likely from gout and not infection. Advised to continue antibiotics at this time pending final cultures. Continue with patient's gout medications except for NSAIDs secondary to renal failure. For now patient will be continued on vancomycin until we get Gram stain and culture results done in the ER. Physical therapy consult. Dietitian consult for gout. 2. Acute renal failure - patient states he has been not eating well. Check urinalysis. At this time I'm holding all patients NSAIDs and Cozaar and gently hydrating. Follow metabolic panel and intake output. 3. Chronic atrial fibrillation - patient was recently placed on xarelto and recommending medications which will be continued. EKG is pending. 4. Hypertension - continue home medications. 5. Alcoholism - social work consult. Patient has been placed on alcohol withdrawal protocol. Alcoholism probably precipitating #1 and #3. 6. CAD denies any chest pain at this time.   DVT Prophylaxis xarelto.  Code Status:  Full code.  Family Communication: Family at the bedside.  Disposition Plan: Admit for observation.    Raneshia Derick N. [facility] Pager [phone].  If 7PM-7AM, please contact night-coverage [email or web address] Password [email or web address] 02/03/2015, 10:52 PM

## 2015-02-04 DIAGNOSIS — N179 Acute kidney failure, unspecified: Secondary | ICD-10-CM

## 2015-02-04 DIAGNOSIS — I482 Chronic atrial fibrillation: Secondary | ICD-10-CM | POA: Diagnosis not present

## 2015-02-04 DIAGNOSIS — M25461 Effusion, right knee: Secondary | ICD-10-CM | POA: Diagnosis not present

## 2015-02-04 DIAGNOSIS — F1010 Alcohol abuse, uncomplicated: Secondary | ICD-10-CM | POA: Diagnosis not present

## 2015-02-04 LAB — CBC WITH DIFFERENTIAL/PLATELET
BASOS ABS: 0 10*3/uL (ref 0.0–0.1)
Basophils Relative: 0 % (ref 0–1)
Eosinophils Absolute: 0.1 10*3/uL (ref 0.0–0.7)
Eosinophils Relative: 0 % (ref 0–5)
HEMATOCRIT: 37.9 % — AB (ref 39.0–52.0)
Hemoglobin: 12.8 g/dL — ABNORMAL LOW (ref 13.0–17.0)
Lymphocytes Relative: 5 % — ABNORMAL LOW (ref 12–46)
Lymphs Abs: 1 10*3/uL (ref 0.7–4.0)
MCH: 32.2 pg (ref 26.0–34.0)
MCHC: 33.8 g/dL (ref 30.0–36.0)
MCV: 95.2 fL (ref 78.0–100.0)
MONO ABS: 2.3 10*3/uL — AB (ref 0.1–1.0)
MONOS PCT: 12 % (ref 3–12)
Neutro Abs: 15.7 10*3/uL — ABNORMAL HIGH (ref 1.7–7.7)
Neutrophils Relative %: 83 % — ABNORMAL HIGH (ref 43–77)
Platelets: 299 10*3/uL (ref 150–400)
RBC: 3.98 MIL/uL — ABNORMAL LOW (ref 4.22–5.81)
RDW: 13.2 % (ref 11.5–15.5)
WBC: 19.2 10*3/uL — AB (ref 4.0–10.5)

## 2015-02-04 LAB — COMPREHENSIVE METABOLIC PANEL
ALT: 26 U/L (ref 17–63)
ANION GAP: 9 (ref 5–15)
AST: 24 U/L (ref 15–41)
Albumin: 2.3 g/dL — ABNORMAL LOW (ref 3.5–5.0)
Alkaline Phosphatase: 54 U/L (ref 38–126)
BUN: 28 mg/dL — AB (ref 6–20)
CHLORIDE: 103 mmol/L (ref 101–111)
CO2: 25 mmol/L (ref 22–32)
CREATININE: 1.69 mg/dL — AB (ref 0.61–1.24)
Calcium: 8.9 mg/dL (ref 8.9–10.3)
GFR calc non Af Amer: 38 mL/min — ABNORMAL LOW (ref >60)
GFR, EST AFRICAN AMERICAN: 45 mL/min — AB (ref >60)
Glucose, Bld: 106 mg/dL — ABNORMAL HIGH (ref 65–99)
POTASSIUM: 4 mmol/L (ref 3.5–5.1)
Sodium: 137 mmol/L (ref 135–145)
TOTAL PROTEIN: 5.7 g/dL — AB (ref 6.5–8.1)
Total Bilirubin: 0.5 mg/dL (ref 0.3–1.2)

## 2015-02-04 LAB — URINALYSIS, ROUTINE W REFLEX MICROSCOPIC
BILIRUBIN URINE: NEGATIVE
Glucose, UA: NEGATIVE mg/dL
HGB URINE DIPSTICK: NEGATIVE
KETONES UR: NEGATIVE mg/dL
LEUKOCYTES UA: NEGATIVE
NITRITE: NEGATIVE
PH: 5.5 (ref 5.0–8.0)
PROTEIN: NEGATIVE mg/dL
SPECIFIC GRAVITY, URINE: 1.021 (ref 1.005–1.030)
UROBILINOGEN UA: 1 mg/dL (ref 0.0–1.0)

## 2015-02-04 LAB — SODIUM, URINE, RANDOM: Sodium, Ur: 104 mmol/L

## 2015-02-04 MED ORDER — PREDNISONE 5 MG PO TABS: ORAL_TABLET | ORAL | Status: DC

## 2015-02-04 MED ORDER — FOLIC ACID 1 MG PO TABS: 1.0000 mg | ORAL_TABLET | Freq: Every day | ORAL | Status: DC

## 2015-02-04 MED ORDER — RISAQUAD PO CAPS: 1.0000 | ORAL_CAPSULE | Freq: Every day | ORAL | Status: DC

## 2015-02-04 MED ORDER — OXYCODONE HCL 5 MG PO TABS: 5.0000 mg | ORAL_TABLET | ORAL | Status: DC | PRN

## 2015-02-04 MED ORDER — HYDRALAZINE HCL 20 MG/ML IJ SOLN: 5.0000 mg | INTRAMUSCULAR | Status: DC | PRN

## 2015-02-04 MED ORDER — ATORVASTATIN CALCIUM 10 MG PO TABS: 20.0000 mg | ORAL_TABLET | Freq: Every day | ORAL | Status: DC

## 2015-02-04 MED ORDER — DOXYCYCLINE HYCLATE 50 MG PO CAPS: 100.0000 mg | ORAL_CAPSULE | Freq: Two times a day (BID) | ORAL | Status: DC

## 2015-02-04 MED ORDER — THIAMINE HCL 100 MG PO TABS: 100.0000 mg | ORAL_TABLET | Freq: Every day | ORAL | Status: DC

## 2015-02-04 NOTE — Discharge Summary (Signed)
Physician Discharge Summary  Gregory Wheeler IRW:[id number] DOB: [birth year] DOA: 02/03/2015  PCP: Gregory Hoit, MD  Admit date: 02/03/2015 Discharge date: 02/04/2015  Time spent: 35 minutes  Recommendations for Outpatient Follow-up:  1. Cbc, bmp 1 week oral antibiotics such as doxycycline for 2 weeks Follow up on blood cultures as patient left before these could be obtained  Discharge Diagnoses:  Principal Problem:   Effusion of right knee Active Problems:   Coronary artery disease   Hypertension   AKI (acute kidney injury)   Chronic atrial fibrillation   Knee effusion   Alcohol abuse   Discharge Condition: improved  Diet recommendation: cardiac  Filed Weights   02/04/15 0600  Weight: 108.863 kg (240 lb)    History of present illness:  Gregory Wheeler is a 74 y.o. male with history of CAD status post stenting, atrial fibrillation, hypertension, alcoholism, gout was recently experiencing increasing swelling of his right knee and was placed on prednisone by patient's rheumatologist. Patient states his swelling started last week and had gone to his rheumatologist who had aspirated his knee and was placed on prednisone. Today patient's rheumatology office had called him stating that cultures grew staph aureus and advised to come to the ER. In the ER patient had a knee aspiration done by Dr. Magnus Wheeler on call orthopedic surgeon. Patient was started on empiric antibiotics after blood cultures obtained and admitted for further management. Patient otherwise denies any fever chills chest pain shortness of breath nausea vomiting or abdominal pain or diarrhea.   Hospital Course:  Right knee with significant pre-patella swelling most consistent with bursitis likely from gout - patient refusing to stay another night for IV Abx- no cultures done, IV vanc x 2 days, will give 2 weeks of doxy per ortho recommendations, will need BMP and CBC with PCP in 1 week -will facilitate safe d/c as opposed  to patient leaving AMA -encouraged cessation of alcohol  Procedures:  Aspiration and bedside debridement  Consultations:  ortho  Discharge Exam: Filed Vitals:   02/04/15 1050  BP: 115/68  Pulse:   Temp:   Resp:       Discharge Instructions   Discharge Instructions    Diet - low sodium heart healthy    Complete by:  As directed      Discharge instructions    Complete by:  As directed   Cbc, bmp 1 week     Increase activity slowly    Complete by:  As directed           Current Discharge Medication List    START taking these medications   Details  acidophilus (RISAQUAD) CAPS capsule Take 1 capsule by mouth daily. Qty: 14 capsule, Refills: 0    doxycycline (VIBRAMYCIN) 50 MG capsule Take 2 capsules (100 mg total) by mouth 2 (two) times daily. Qty: 56 capsule, Refills: 0    folic acid (FOLVITE) 1 MG tablet Take 1 tablet (1 mg total) by mouth daily. Qty: 30 tablet, Refills: 0    oxyCODONE (OXY IR/ROXICODONE) 5 MG immediate release tablet Take 1 tablet (5 mg total) by mouth every 4 (four) hours as needed for moderate pain. Qty: 15 tablet, Refills: 0    predniSONE (DELTASONE) 5 MG tablet 25 mg on Saturday then 20 mg on Sunday, 15 mg on Monday, 10 mg on Tuesday, 5 mg on Wednesday and then stop Qty: 15 tablet, Refills: 0    thiamine 100 MG tablet Take 1 tablet (100 mg total) by mouth  daily. Qty: 30 tablet, Refills: 0      CONTINUE these medications which have NOT CHANGED   Details  allopurinol (ZYLOPRIM) 300 MG tablet Take 300 mg by mouth daily.    clopidogrel (PLAVIX) 75 MG tablet Take 75 mg by mouth daily.    colchicine 0.6 MG tablet Take 1 tablet (0.6 mg total) by mouth daily. Qty: 60 tablet, Refills: 3   Associated Diagnoses: Persistent atrial fibrillation    metoprolol succinate (TOPROL XL) 25 MG 24 hr tablet Take 1 tablet (25 mg total) by mouth daily. Qty: 30 tablet, Refills: 6   Associated Diagnoses: Persistent atrial fibrillation    rivaroxaban  (XARELTO) 20 MG TABS tablet Take 1 tablet (20 mg total) by mouth daily with supper. Qty: 30 tablet, Refills: 6   Associated Diagnoses: Persistent atrial fibrillation    simvastatin (ZOCOR) 40 MG tablet Take 1 tablet (40 mg total) by mouth at bedtime. Qty: 90 tablet, Refills: 1    verapamil (COVERA HS) 240 MG (CO) 24 hr tablet Take 240 mg by mouth daily.       STOP taking these medications     Aspirin-Acetaminophen-Caffeine (GOODY HEADACHE PO)      indomethacin (INDOCIN) 50 MG capsule      losartan (COZAAR) 25 MG tablet      predniSONE (STERAPRED UNI-PAK 48 TAB) 5 MG (48) TBPK tablet        No Known Allergies Follow-up Information    Follow up with Gregory Hitch, MD. Schedule an appointment as soon as possible for a visit in 2 weeks.   Specialty:  Orthopedic Surgery   Contact information:   [address] [place] [address] [phone]       Follow up with Gregory Hoit, MD In 1 week.   Specialty:  Family Medicine   Contact information:   [address] [place] [address] [phone]        The results of significant diagnostics from this hospitalization (including imaging, microbiology, ancillary and laboratory) are listed below for reference.    Significant Diagnostic Studies: Dg Knee Complete 4 Views Right  02/03/2015   CLINICAL DATA:  RIGHT knee pain and swelling. Redness for 1 week. No injury. Fluid aspirated 02/01/2015.  EXAM: RIGHT KNEE - COMPLETE 4+ VIEW  COMPARISON:  None.  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 Chondrocalcinosis of the menisci. Medial and lateral joint spaces are relatively preserved although there is mild medial compartment osteoarthritis. Calcifications are present throughout the knee joint, also likely from gout and/or CPPD. Atherosclerosis. No fracture.  IMPRESSION: No acute osseous abnormality. Chronic changes compatible with  gout, most pronounced in the prepatellar soft tissues and in the knee joint.   Electronically Signed   By: Gregory Wheeler M.D.   On: 02/03/2015 20:32    Microbiology: No results found for this or any previous visit (from the past 240 hour(s)).   Labs: Basic Metabolic Panel:  Recent Labs Lab 01/31/15 1118 02/03/15 1851 02/04/15 0336  NA 130* 134* 137  K 5.0 4.6 4.0  CL 96 100* 103  CO2 GLUCOSE 90 157* 106*  BUN 21 31* 28*  CREATININE 1.41* 2.13* 1.69*  CALCIUM 9.4 9.4 8.9   Liver Function Tests:  Recent Labs Lab 01/31/15 1118 02/03/15 1851 02/04/15 0336  AST ALT ALKPHOS 62 70 54  BILITOT 1.3* 0.3 0.5  PROT 6.2 6.8  5.7*  ALBUMIN 3.4* 2.9* 2.3*   No results for input(s): LIPASE, AMYLASE in the last 168 hours. No results for input(s): AMMONIA in the last 168 hours. CBC:  Recent Labs Lab 01/31/15 1118 02/03/15 1851 02/04/15 0336  WBC 19.8* 24.8* 19.2*  NEUTROABS 16.4* 21.5* 15.7*  HGB 14.1 14.2 12.8*  HCT 41.0 41.0 37.9*  MCV 94.0 94.9 95.2  PLT 276 314 299   Cardiac Enzymes: No results for input(s): CKTOTAL, CKMB, CKMBINDEX, TROPONINI in the last 168 hours. BNP: BNP (last 3 results) No results for input(s): BNP in the last 8760 hours.  ProBNP (last 3 results) No results for input(s): PROBNP in the last 8760 hours.  CBG: No results for input(s): GLUCAP in the last 168 hours.     SignedMarlin Canary  [facility] 02/04/2015, 1:44 PM

## 2015-02-04 NOTE — Evaluation (Signed)
Physical Therapy Evaluation Patient Details Name: Gregory Wheeler MRN: [id number] DOB: 04-Nov-1940 Today's Date: 02/04/2015   History of Present Illness  Patient is a 74 y/o male presents with right knee prepatellar swelling most consistent with bursitis likely from gout s/p right knee aspiration. PMH includes CAD, HTN, HLD, A-fib, RBBB, gout.    Clinical Impression  Patient presents with discomfort and mild balance deficits/deconditioning due to recent procedure and hospitalization impacting mobility. Balance improved with increased distance. Pt with dyspnea on exertion. May benefit from [facility] for stability until knee pain improves. Pt would benefit from skilled PT to maximize independence and safe mobility prior to return home.    Follow Up Recommendations Supervision for mobility/OOB;No PT follow up    Equipment Recommendations  None recommended by PT    Recommendations for Other Services       Precautions / Restrictions Precautions Precautions: Fall Restrictions Weight Bearing Restrictions: No      Mobility  Bed Mobility Overal bed mobility: Modified Independent                Transfers Overall transfer level: Needs assistance Equipment used: None Transfers: Sit to/from Stand Sit to Stand: Min guard         General transfer comment: Min guard initially as pt impulsive progressing to supervision. Stood from [employer]. Transferred to chair.  Ambulation/Gait Ambulation/Gait assistance: Min guard Ambulation Distance (Feet): 100 Feet Assistive device:  (pushing IV pole) Gait Pattern/deviations: Decreased stance time - right;Decreased step length - left;Decreased stride length   Gait velocity interpretation: Below normal speed for age/gender General Gait Details: Initially unsteady gait however balance improved with increased distance. Decreased stance time RLE due to soreness. Dyspnea present. Sa02 89% on RA post ambulation bout.   Stairs Stairs: Yes Stairs  assistance: Min guard Stair Management: Alternating pattern;Forwards;One rail Left Number of Stairs: 3 General stair comments: Cues for technique.   Wheelchair Mobility    Modified Rankin (Stroke Patients Only)       Balance Overall balance assessment: Needs assistance Sitting-balance support: Feet supported;No upper extremity supported Sitting balance-Leahy Scale: Good Sitting balance - Comments: Able to donn socks sitting EOB reaching outside BoS without LOB.   Standing balance support: During functional activity Standing balance-Leahy Scale: Fair                               Pertinent Vitals/Pain Pain Assessment: Faces Faces Pain Scale: Hurts a little bit Pain Location: right knee Pain Descriptors / Indicators: Sore Pain Intervention(s): Monitored during session;Repositioned    Home Living Family/patient expects to be discharged to:: Private residence Living Arrangements: Spouse/significant other Available Help at Discharge: Family;Available 24 hours/day Type of Home: House Home Access: Stairs to enter Entrance Stairs-Rails: Can reach both Entrance Stairs-Number of Steps: 3 Home Layout: One level Home Equipment: None      Prior Function Level of Independence: Independent         Comments: Owns a used car dealership.     Hand Dominance        Extremity/Trunk Assessment   Upper Extremity Assessment: Defer to OT evaluation           Lower Extremity Assessment: RLE deficits/detail RLE Deficits / Details: Lacking full knee extension 2/2 to pain? ace wrap?        Communication   Communication: No difficulties  Cognition Arousal/Alertness: Awake/alert Behavior During Therapy: WFL for tasks assessed/performed Overall Cognitive Status: Within Functional  Limits for tasks assessed                      General Comments General comments (skin integrity, edema, etc.): Family present during PT session.    Exercises         Assessment/Plan    PT Assessment Patient needs continued PT services  PT Diagnosis Difficulty walking   PT Problem List Decreased strength;Cardiopulmonary status limiting activity;Decreased range of motion;Decreased balance;Decreased mobility  PT Treatment Interventions Balance training;Gait training;Stair training;Functional mobility training;Therapeutic activities;Therapeutic exercise;Patient/family education   PT Goals (Current goals can be found in the Care Plan section) Acute Rehab PT Goals Patient Stated Goal: to return home PT Goal Formulation: With patient Time For Goal Achievement: 02/18/15 Potential to Achieve Goals: Good    Frequency Min 3X/week   Barriers to discharge        Co-evaluation               End of Session Equipment Utilized During Treatment: Gait belt Activity Tolerance: Patient tolerated treatment well Patient left: in chair;with call bell/phone within reach;with family/visitor present Nurse Communication: Mobility status    Functional Assessment Tool Used: Clinical judgment Functional Limitation: Mobility: Walking and moving around Mobility: Walking and Moving Around Current Status [phone]): At least 1 percent but less than 20 percent impaired, limited or restricted Mobility: Walking and Moving Around Goal Status [phone]): At least 1 percent but less than 20 percent impaired, limited or restricted    Time: 1009-1026 PT Time Calculation (min) (ACUTE ONLY): 17 min   Charges:   PT Evaluation $Initial PT Evaluation Tier I: 1 Procedure     PT G Codes:   PT G-Codes **NOT FOR INPATIENT CLASS** Functional Assessment Tool Used: Clinical judgment Functional Limitation: Mobility: Walking and moving around Mobility: Walking and Moving Around Current Status ([id number]): At least 1 percent but less than 20 percent impaired, limited or restricted Mobility: Walking and Moving Around Goal Status [phone]): At least 1 percent but less than 20 percent  impaired, limited or restricted    Keora Eccleston A Emilene Roma 02/04/2015, 10:47 AM Mylo Red, PT, DPT [phone]

## 2015-02-04 NOTE — Plan of Care (Signed)
Problem: Food- and Nutrition-Related Knowledge Deficit (NB-1.1) Goal: Nutrition education Formal process to instruct or train a patient/client in a skill or to impart knowledge to help patients/clients voluntarily manage or modify food choices and eating behavior to maintain or improve health. Outcome: Completed/Met Date Met:  02/04/15 RD consulted for a diet education regarding gout.   Patient was given "Low Purine Nutrition Therapy" from the Academy of nutrition and Dietetics Manual. Discussed the relationship between low purine foods, uric acid, and gout flare ups. Provided a list of foods that are low, moderate, and high in purines. Recommended avoidance or limitation on consuming alcohol. RD contact information was given. Teach back method used.   Expect good compliance.  Body mass index is 33.49 kg/(m^2). Class I obesity  Pt is currently on a heart diet. Meal completion has been 65% this AM, however reports he did not like the food thus did not eat all of it. Pt reports having a good appetite with no other difficulties. Pt reports he will better consume his meals at lunch and dinner. Labs and medications reviewed. No further nutrition intervention needed. Please re-consult RD if nutritional issues arise.  Corrin Parker, MS, RD, LDN Pager # [phone] After hours/ weekend pager # [phone]

## 2015-02-07 ENCOUNTER — Telehealth: Payer: Self-pay | Admitting: Cardiology

## 2015-02-07 NOTE — Telephone Encounter (Signed)
Spoke with pt wife, aware of dr Elvis Coil recommendations.

## 2015-02-07 NOTE — Telephone Encounter (Signed)
He should not stop his cardiac meds! Needs to stay on Xarelto and Toprol for weight control he may hold verapamil for one day. If symptoms persist may need to consider TEE cardioversion since Afib/flutter is probably why he is so weak. He may hold prednisone.  Ammie Warrick [place] MD, [facility]

## 2015-02-07 NOTE — Telephone Encounter (Signed)
Pt's son Christen Bame is calling in wanting to speak with Dr. Elvis Coil nurse about his father's current condition. He states that he is currently on some blood thinners , pain meds , and Prednisone which is causing his BP to run low. Please call  Thanks

## 2015-02-07 NOTE — Telephone Encounter (Signed)
Follow Up   Son is calling back following up on call from earlier. Requesting a call from staff.

## 2015-02-07 NOTE — Telephone Encounter (Signed)
Spoke with pt son, patient has a lot going on but his bp today was 100/60 this am and now it is 85/63. He states the patient does not feel well, he is lightheaded and has no appetite, but he is drinking water. He has taken all of his medications today. Orthostatic precautions discussed with son. Will forward for dr jordan's review.

## 2015-02-08 MED ORDER — METOPROLOL SUCCINATE ER 50 MG PO TB24: 50.0000 mg | ORAL_TABLET | Freq: Every day | ORAL | Status: DC

## 2015-02-08 NOTE — Addendum Note (Signed)
Addended by: Meda Klinefelter D on: 02/08/2015 11:49 AM   Modules accepted: Orders, Medications

## 2015-02-08 NOTE — Telephone Encounter (Signed)
Spoke to patient's son Christen Bame he stated father's B/P this morning 122/89 pulse 109.Patient is not taking verapamil.Dr.Jordan advised to increase metoprolol to 50 mg daily.Spoke to wife she understands to increase metoprolol to 50 mg daily.Advised to continue to monitor B/P and pulse.Advised to keep appointments as planned and call sooner if needed.

## 2015-02-09 ENCOUNTER — Ambulatory Visit (HOSPITAL_COMMUNITY)
Admission: RE | Admit: 2015-02-09 | Discharge: 2015-02-09 | Disposition: A | Discharge status: Home / Self Care | Payer: Medicare Other | Source: Ambulatory Visit | Attending: Cardiology | Admitting: Cardiology

## 2015-02-09 DIAGNOSIS — I371 Nonrheumatic pulmonary valve insufficiency: Secondary | ICD-10-CM | POA: Insufficient documentation

## 2015-02-09 DIAGNOSIS — I4819 Other persistent atrial fibrillation: Secondary | ICD-10-CM

## 2015-02-09 DIAGNOSIS — I4891 Unspecified atrial fibrillation: Secondary | ICD-10-CM | POA: Diagnosis present

## 2015-02-09 DIAGNOSIS — E785 Hyperlipidemia, unspecified: Secondary | ICD-10-CM

## 2015-02-09 DIAGNOSIS — M10061 Idiopathic gout, right knee: Secondary | ICD-10-CM

## 2015-02-09 DIAGNOSIS — I059 Rheumatic mitral valve disease, unspecified: Secondary | ICD-10-CM | POA: Diagnosis not present

## 2015-02-09 DIAGNOSIS — I2510 Atherosclerotic heart disease of native coronary artery without angina pectoris: Secondary | ICD-10-CM

## 2015-02-09 DIAGNOSIS — I358 Other nonrheumatic aortic valve disorders: Secondary | ICD-10-CM | POA: Insufficient documentation

## 2015-02-09 DIAGNOSIS — I10 Essential (primary) hypertension: Secondary | ICD-10-CM | POA: Diagnosis not present

## 2015-02-09 DIAGNOSIS — I4510 Unspecified right bundle-branch block: Secondary | ICD-10-CM | POA: Diagnosis not present

## 2015-02-09 DIAGNOSIS — I441 Atrioventricular block, second degree: Secondary | ICD-10-CM

## 2015-02-09 DIAGNOSIS — I481 Persistent atrial fibrillation: Secondary | ICD-10-CM

## 2015-02-09 NOTE — Progress Notes (Signed)
2D Echo Complete, 02/09/15.  This is a technically limited study due to Atrial Fibrillation.  There is an echogenic area anterior to the Right Ventricle which could represent fat pad versus complex pericardial effusion.  There does not appear to be any hemodynamic compromise of the right ventricle, and [name] does not complain of any symptoms.    Farrel Conners, RDCS

## 2015-02-10 DIAGNOSIS — M25561 Pain in right knee: Secondary | ICD-10-CM | POA: Diagnosis not present

## 2015-02-10 DIAGNOSIS — M25461 Effusion, right knee: Secondary | ICD-10-CM | POA: Diagnosis not present

## 2015-02-10 LAB — CULTURE, BLOOD (ROUTINE X 2)
Culture: NO GROWTH
Culture: NO GROWTH

## 2015-02-21 ENCOUNTER — Encounter: Payer: Self-pay | Admitting: Cardiology

## 2015-02-21 ENCOUNTER — Ambulatory Visit (INDEPENDENT_AMBULATORY_CARE_PROVIDER_SITE_OTHER): Payer: Medicare Other | Admitting: Cardiology

## 2015-02-21 ENCOUNTER — Other Ambulatory Visit: Payer: Self-pay | Admitting: Cardiology

## 2015-02-21 VITALS — BP 106/64 | HR 92 | Ht 71.0 in | Wt 229.5 lb

## 2015-02-21 DIAGNOSIS — I441 Atrioventricular block, second degree: Secondary | ICD-10-CM | POA: Diagnosis not present

## 2015-02-21 DIAGNOSIS — I481 Persistent atrial fibrillation: Secondary | ICD-10-CM | POA: Diagnosis not present

## 2015-02-21 DIAGNOSIS — M1000 Idiopathic gout, unspecified site: Secondary | ICD-10-CM

## 2015-02-21 DIAGNOSIS — I4819 Other persistent atrial fibrillation: Secondary | ICD-10-CM

## 2015-02-21 DIAGNOSIS — I2510 Atherosclerotic heart disease of native coronary artery without angina pectoris: Secondary | ICD-10-CM

## 2015-02-21 NOTE — Progress Notes (Signed)
Gregory Wheeler Date of Birth: 1941-06-01 Gregory Wheeler  History of Present Illness: [name] is seen for follow up of CAD and new onset atrial fibrillation.  He has a history of coronary disease and is status post stenting of the LAD with overlapping Taxus stents in 2009. His last myoview in April 2015 showed mild apical thinning, normal EF 67%.  He  was seen in the ED in February for symptoms of left arm numbness and pain. This was worse with lifting his arm. Ecg was done and demonstrated tachycardiac with no clear P waves and rate 113. He was treated with a muscle relaxant.  He was seen 3 weeks ago with symptoms of fatigue and acute gout flair in his right knee bursa. He was noted to be in atrial fibrillation with RVR (and review of ECG from February showed the same). He had been drinking Etoh heavily. He was started on metoprolol and Xarelto. He [place]. He had surgical excision and drainage of his right Knee by Dr. Rayburn MaBlackmon. He is feeling better today. Is missing his Etoh but has been compliant. Swelling in legs improved. Denies any palpitations.   Current Outpatient Prescriptions on File Prior to Visit  Medication Sig Dispense Refill  . acidophilus (RISAQUAD) CAPS capsule Take 1 capsule by mouth daily. 14 capsule 0  . allopurinol (ZYLOPRIM) 300 MG tablet Take 300 mg by mouth daily.    . colchicine 0.6 MG tablet Take 1 tablet (0.6 mg total) by mouth daily. 60 tablet 3  . folic acid (FOLVITE) 1 MG tablet Take 1 tablet (1 mg total) by mouth daily. 30 tablet 0  . metoprolol succinate (TOPROL-XL) 50 MG 24 hr tablet Take 1 tablet (50 mg total) by mouth daily. Take with or immediately following a meal. 30 tablet 6  . rivaroxaban (XARELTO) 20 MG TABS tablet Take 1 tablet (20 mg total) by mouth daily with supper. (Patient taking differently: Take 20 mg by mouth daily. ) 30 tablet 6  . simvastatin (ZOCOR) 40 MG tablet Take 1 tablet (40 mg total) by mouth at bedtime. (Patient taking differently:  Take 40 mg by mouth daily. ) 90 tablet 1  . thiamine 100 MG tablet Take 1 tablet (100 mg total) by mouth daily. 30 tablet 0   No current facility-administered medications on file prior to visit.    No Known Allergies  Past Medical History  Diagnosis Date  . Coronary artery disease 12/2007    STATUS POST STENTING OF THE LEFT ANTERIOR DESCENDING ARTERY  . Hypertension   . Hyperlipidemia   . SBO (small bowel obstruction)     PARTIAL  . LBP (low back pain)   . Arthritis   . Gout   . RBBB (right bundle branch block)   . Partial small bowel obstruction   . Hiatal hernia   . Chronic gout   . Urinary tract infection     Escherichia coli urinary tract infection  . Dyslipidemia   . Second degree AV block, Mobitz type I   . Atrial fibrillation 01/31/2015    Past Surgical History  Procedure Laterality Date  . Hernia repair      INGUINAL  . Rotator cuff repair    . Cardiac catheterization      History  Smoking status  . Former Smoker  . Quit date: 08/27/1990  Smokeless tobacco  . Not on file    History  Alcohol Use  . Yes    Comment: Drinks every day.  Family History  Problem Relation Age of Onset  . Emphysema Mother 71  . Heart attack Father 53    Review of Systems: As noted in HPI All other systems were reviewed and are negative.  Physical Exam: BP 106/64 mmHg  Pulse 92  Ht 5' 11" (1.803 m)  Wt 104.101 kg (229 lb 8 oz)  BMI 32.02 kg/m2 He is an overweight white male in no acute distress. He has no JVD or bruits. HEENT exam is unremarkable. PERRLA, sclera are clear. Oropharynx is clear. Lungs are clear. Cardiac exam reveals an irregular rate and rhythm without gallop or murmur. Abdomen is obese, soft, nontender. He has tr edema.  Pedal pulses are 2+ and symmetric. Right knee is wrapped in a surgical dressing. He is alert and oriented x3. Cranial nerves II through XII are intact.  LABORATORY DATA: Lab Results  Component Value Date   WBC 19.2* 02/04/2015    HGB 12.8* 02/04/2015   HCT 37.9* 02/04/2015   PLT 299 02/04/2015   GLUCOSE 106* 02/04/2015   CHOL 128 08/24/2014   TRIG 70 08/24/2014   HDL 57 08/24/2014   LDLCALC 57 08/24/2014   ALT 26 02/04/2015   AST 24 02/04/2015   NA 137 02/04/2015   K 4.0 02/04/2015   CL 103 02/04/2015   CREATININE 1.69* 02/04/2015   BUN 28* 02/04/2015   CO2 25 02/04/2015   TSH 1.927 01/31/2015   Echo: 02/09/15  Study Conclusions  - Left ventricle: The cavity size was normal. There was mild focal basal hypertrophy of the septum. Systolic function was normal. The estimated ejection fraction was in the range of 60% to 65%. Wall motion was normal; there were no regional wall motion abnormalities. - Aortic valve: Trileaflet; normal thickness, mildly calcified leaflets. - Mitral valve: Calcified annulus. - Pulmonic valve: There was trivial regurgitation.    Assessment / Plan: 1. Coronary disease status post stenting of the LAD in 2009 with Taxus stents. Nuclear stress test in April 2015 showed a small apical infarct with normal ejection fraction. Continue with risk factor modification. Antiplatelet therapy stopped now that he is on Xarelto.  2. Atrial fibrillation with RVR. Rate now improved on metoprolol. This appears to have been present in February but was not recognized.  [place] vasc score is 3. Recommend continued anticoagulation with Xarelto 20 mg daily. Stop ASA and Plavix. Recommend continued total abstinence from alcohol. Avoid NSAIDs. Will schedule for DCCV in 1-2 weeks. Update BMET and CBC. Procedure reviewed with patient and wife including risk of CVA and other arrhythmias. They are agreeable to proceed. With normal Echo I am hopeful that he will be able to maintain NSR if he can stay sober.   3. Hyperlipidemia  on simvastatin.   4. History of Mobitz type 1 second degree AV block.   5. HTN controlled  6. Gout. Exacerbated by recent Etoh use. S/p surgical drainage of right knee bursa.    7. Etoh abuse. Recommend total abstinence.       

## 2015-02-21 NOTE — Patient Instructions (Addendum)
Cardioversion scheduled Thurs 03/03/15 at 1:00 pm.Arrive at [facility] 11:30 am.Follow instructions given.

## 2015-02-23 ENCOUNTER — Other Ambulatory Visit: Payer: Self-pay

## 2015-02-23 DIAGNOSIS — I441 Atrioventricular block, second degree: Secondary | ICD-10-CM

## 2015-02-23 DIAGNOSIS — I4510 Unspecified right bundle-branch block: Secondary | ICD-10-CM

## 2015-02-23 DIAGNOSIS — I482 Chronic atrial fibrillation, unspecified: Secondary | ICD-10-CM

## 2015-02-24 ENCOUNTER — Telehealth: Payer: Self-pay | Admitting: Cardiology

## 2015-02-24 NOTE — Telephone Encounter (Signed)
Yes metoprolol should be 50 mg daily. This was increased on 02/08/15  Meaghan Whistler Swaz[place] MD, [facility]

## 2015-02-24 NOTE — Telephone Encounter (Signed)
Returned call to patient's wife no answer.Left message on personal voice mail Dr.Jordan advised should be taking metoprolol 50 mg daily.

## 2015-02-24 NOTE — Telephone Encounter (Signed)
Returned call to patient's wife she stated she picked up Metoprolol prescription yesterday and noticed it was 25 mg.Stated she was calling to see if metoprolol was decreased to 25 mg daily.Advised patient should be taking Metoprolol 50 mg daily.I will send message to Dr.Jordan for advice.

## 2015-02-24 NOTE — Telephone Encounter (Signed)
Please call,question about his Metoprolol. °

## 2015-02-24 NOTE — Telephone Encounter (Signed)
Returned call to patient's wife phone rings busy.

## 2015-03-01 ENCOUNTER — Other Ambulatory Visit (INDEPENDENT_AMBULATORY_CARE_PROVIDER_SITE_OTHER): Payer: Medicare Other | Admitting: *Deleted

## 2015-03-01 DIAGNOSIS — I482 Chronic atrial fibrillation, unspecified: Secondary | ICD-10-CM

## 2015-03-01 DIAGNOSIS — I4510 Unspecified right bundle-branch block: Secondary | ICD-10-CM | POA: Diagnosis not present

## 2015-03-01 DIAGNOSIS — I441 Atrioventricular block, second degree: Secondary | ICD-10-CM

## 2015-03-01 LAB — BASIC METABOLIC PANEL
BUN: 13 mg/dL (ref 6–23)
CALCIUM: 9.3 mg/dL (ref 8.4–10.5)
CO2: 26 mEq/L (ref 19–32)
Chloride: 105 mEq/L (ref 96–112)
Creatinine, Ser: 1.26 mg/dL (ref 0.40–1.50)
GFR: 59.58 mL/min — AB (ref >60.00)
Glucose, Bld: 117 mg/dL — ABNORMAL HIGH (ref 70–99)
Potassium: 3.6 mEq/L (ref 3.5–5.1)
SODIUM: 139 meq/L (ref 135–145)

## 2015-03-01 LAB — CBC WITH DIFFERENTIAL/PLATELET
Basophils Absolute: 0 10*3/uL (ref 0.0–0.1)
Basophils Relative: 0.3 % (ref 0.0–3.0)
EOS ABS: 0.5 10*3/uL (ref 0.0–0.7)
Eosinophils Relative: 5.3 % — ABNORMAL HIGH (ref 0.0–5.0)
HCT: 41 % (ref 39.0–52.0)
Hemoglobin: 13.3 g/dL (ref 13.0–17.0)
LYMPHS PCT: 23.7 % (ref 12.0–46.0)
Lymphs Abs: 2.2 10*3/uL (ref 0.7–4.0)
MCHC: 32.4 g/dL (ref 30.0–36.0)
MCV: 97 fl (ref 78.0–100.0)
MONOS PCT: 6.4 % (ref 3.0–12.0)
Monocytes Absolute: 0.6 10*3/uL (ref 0.1–1.0)
NEUTROS PCT: 64.3 % (ref 43.0–77.0)
Neutro Abs: 5.9 10*3/uL (ref 1.4–7.7)
Platelets: 245 10*3/uL (ref 150.0–400.0)
RBC: 4.23 Mil/uL (ref 4.22–5.81)
RDW: 14.6 % (ref 11.5–15.5)
WBC: 9.1 10*3/uL (ref 4.0–10.5)

## 2015-03-01 LAB — PROTIME-INR
INR: 1.4 ratio — AB (ref 0.8–1.0)
PROTHROMBIN TIME: 14.9 s — AB (ref 9.6–13.1)

## 2015-03-02 ENCOUNTER — Encounter (HOSPITAL_COMMUNITY): Payer: Self-pay | Admitting: Certified Registered Nurse Anesthetist

## 2015-03-03 ENCOUNTER — Ambulatory Visit (HOSPITAL_COMMUNITY)
Admission: RE | Admit: 2015-03-03 | Discharge: 2015-03-03 | Disposition: A | Discharge status: Home / Self Care | Payer: Medicare Other | Source: Ambulatory Visit | Attending: Cardiovascular Disease | Admitting: Cardiovascular Disease

## 2015-03-03 ENCOUNTER — Encounter (HOSPITAL_COMMUNITY)
Admission: RE | Disposition: A | Discharge status: Home / Self Care | Payer: Self-pay | Source: Ambulatory Visit | Attending: Cardiovascular Disease

## 2015-03-03 DIAGNOSIS — I10 Essential (primary) hypertension: Secondary | ICD-10-CM | POA: Diagnosis not present

## 2015-03-03 DIAGNOSIS — Z538 Procedure and treatment not carried out for other reasons: Secondary | ICD-10-CM | POA: Insufficient documentation

## 2015-03-03 DIAGNOSIS — Z6832 Body mass index (BMI) 32.0-32.9, adult: Secondary | ICD-10-CM | POA: Insufficient documentation

## 2015-03-03 DIAGNOSIS — Z955 Presence of coronary angioplasty implant and graft: Secondary | ICD-10-CM | POA: Insufficient documentation

## 2015-03-03 DIAGNOSIS — I4891 Unspecified atrial fibrillation: Secondary | ICD-10-CM | POA: Insufficient documentation

## 2015-03-03 DIAGNOSIS — R9431 Abnormal electrocardiogram [ECG] [EKG]: Secondary | ICD-10-CM | POA: Insufficient documentation

## 2015-03-03 DIAGNOSIS — Z87891 Personal history of nicotine dependence: Secondary | ICD-10-CM | POA: Insufficient documentation

## 2015-03-03 DIAGNOSIS — F1010 Alcohol abuse, uncomplicated: Secondary | ICD-10-CM | POA: Insufficient documentation

## 2015-03-03 DIAGNOSIS — Z7901 Long term (current) use of anticoagulants: Secondary | ICD-10-CM | POA: Insufficient documentation

## 2015-03-03 DIAGNOSIS — Z79899 Other long term (current) drug therapy: Secondary | ICD-10-CM | POA: Diagnosis not present

## 2015-03-03 DIAGNOSIS — E785 Hyperlipidemia, unspecified: Secondary | ICD-10-CM | POA: Insufficient documentation

## 2015-03-03 DIAGNOSIS — M1990 Unspecified osteoarthritis, unspecified site: Secondary | ICD-10-CM | POA: Insufficient documentation

## 2015-03-03 DIAGNOSIS — E663 Overweight: Secondary | ICD-10-CM | POA: Insufficient documentation

## 2015-03-03 DIAGNOSIS — M1A9XX0 Chronic gout, unspecified, without tophus (tophi): Secondary | ICD-10-CM | POA: Diagnosis not present

## 2015-03-03 DIAGNOSIS — I440 Atrioventricular block, first degree: Secondary | ICD-10-CM | POA: Insufficient documentation

## 2015-03-03 DIAGNOSIS — I441 Atrioventricular block, second degree: Secondary | ICD-10-CM | POA: Insufficient documentation

## 2015-03-03 DIAGNOSIS — I2510 Atherosclerotic heart disease of native coronary artery without angina pectoris: Secondary | ICD-10-CM | POA: Diagnosis not present

## 2015-03-03 DIAGNOSIS — I4510 Unspecified right bundle-branch block: Secondary | ICD-10-CM | POA: Diagnosis not present

## 2015-03-03 SURGERY — CANCELLED PROCEDURE

## 2015-03-03 NOTE — H&P (View-Only) (Signed)
[name] Date of Birth: 1941-06-01 Gregory Wheeler  History of Present Illness: [name] is seen for follow up of CAD and new onset atrial fibrillation.  He has a history of coronary disease and is status post stenting of the LAD with overlapping Taxus stents in 2009. His last myoview in April 2015 showed mild apical thinning, normal EF 67%.  He  was seen in the ED in February for symptoms of left arm numbness and pain. This was worse with lifting his arm. Ecg was done and demonstrated tachycardiac with no clear P waves and rate 113. He was treated with a muscle relaxant.  He was seen 3 weeks ago with symptoms of fatigue and acute gout flair in his right knee bursa. He was noted to be in atrial fibrillation with RVR (and review of ECG from February showed the same). He had been drinking Etoh heavily. He was started on metoprolol and Xarelto. He has abstained from [place]. He had surgical excision and drainage of his right Knee by Dr. Rayburn MaBlackmon. He is feeling better today. Is missing his Etoh but has been compliant. Swelling in legs improved. Denies any palpitations.   Current Outpatient Prescriptions on File Prior to Visit  Medication Sig Dispense Refill  . acidophilus (RISAQUAD) CAPS capsule Take 1 capsule by mouth daily. 14 capsule 0  . allopurinol (ZYLOPRIM) 300 MG tablet Take 300 mg by mouth daily.    . colchicine 0.6 MG tablet Take 1 tablet (0.6 mg total) by mouth daily. 60 tablet 3  . folic acid (FOLVITE) 1 MG tablet Take 1 tablet (1 mg total) by mouth daily. 30 tablet 0  . metoprolol succinate (TOPROL-XL) 50 MG 24 hr tablet Take 1 tablet (50 mg total) by mouth daily. Take with or immediately following a meal. 30 tablet 6  . rivaroxaban (XARELTO) 20 MG TABS tablet Take 1 tablet (20 mg total) by mouth daily with supper. (Patient taking differently: Take 20 mg by mouth daily. ) 30 tablet 6  . simvastatin (ZOCOR) 40 MG tablet Take 1 tablet (40 mg total) by mouth at bedtime. (Patient taking differently:  Take 40 mg by mouth daily. ) 90 tablet 1  . thiamine 100 MG tablet Take 1 tablet (100 mg total) by mouth daily. 30 tablet 0   No current facility-administered medications on file prior to visit.    No Known Allergies  Past Medical History  Diagnosis Date  . Coronary artery disease 12/2007    STATUS POST STENTING OF THE LEFT ANTERIOR DESCENDING ARTERY  . Hypertension   . Hyperlipidemia   . SBO (small bowel obstruction)     PARTIAL  . LBP (low back pain)   . Arthritis   . Gout   . RBBB (right bundle branch block)   . Partial small bowel obstruction   . Hiatal hernia   . Chronic gout   . Urinary tract infection     Escherichia coli urinary tract infection  . Dyslipidemia   . Second degree AV block, Mobitz type I   . Atrial fibrillation 01/31/2015    Past Surgical History  Procedure Laterality Date  . Hernia repair      INGUINAL  . Rotator cuff repair    . Cardiac catheterization      History  Smoking status  . Former Smoker  . Quit date: 08/27/1990  Smokeless tobacco  . Not on file    History  Alcohol Use  . Yes    Comment: Drinks every day.  Family History  Problem Relation Age of Onset  . Emphysema Mother 63  . Heart attack Father 37    Review of Systems: As noted in HPI All other systems were reviewed and are negative.  Physical Exam: BP 106/64 mmHg  Pulse 92  Ht  (1.803 m)  Wt 104.101 kg (229 lb 8 oz)  BMI 32.02 kg/m2 He is an overweight white male in no acute distress. He has no JVD or bruits. HEENT exam is unremarkable. PERRLA, sclera are clear. Oropharynx is clear. Lungs are clear. Cardiac exam reveals an irregular rate and rhythm without gallop or murmur. Abdomen is obese, soft, nontender. He has tr edema.  Pedal pulses are 2+ and symmetric. Right knee is wrapped in a surgical dressing. He is alert and oriented x3. Cranial nerves II through XII are intact.  LABORATORY DATA: Lab Results  Component Value Date   WBC 19.2* 02/04/2015    HGB 12.8* 02/04/2015   HCT 37.9* 02/04/2015   PLT 299 02/04/2015   GLUCOSE 106* 02/04/2015   CHOL 128 08/24/2014   TRIG 70 08/24/2014   HDL 57 08/24/2014   LDLCALC 57 08/24/2014   ALT 26 02/04/2015   AST 24 02/04/2015   NA 137 02/04/2015   K 4.0 02/04/2015   CL 103 02/04/2015   CREATININE 1.69* 02/04/2015   BUN 28* 02/04/2015   CO2 25 02/04/2015   TSH 1.927 01/31/2015   Echo: 02/09/15  Study Conclusions  - Left ventricle: The cavity size was normal. There was mild focal basal hypertrophy of the septum. Systolic function was normal. The estimated ejection fraction was in the range of 60% to 65%. Wall motion was normal; there were no regional wall motion abnormalities. - Aortic valve: Trileaflet; normal thickness, mildly calcified leaflets. - Mitral valve: Calcified annulus. - Pulmonic valve: There was trivial regurgitation.    Assessment / Plan: 1. Coronary disease status post stenting of the LAD in 2009 with Taxus stents. Nuclear stress test in April 2015 showed a small apical infarct with normal ejection fraction. Continue with risk factor modification. Antiplatelet therapy stopped now that he is on Xarelto.  2. Atrial fibrillation with RVR. Rate now improved on metoprolol. This appears to have been present in February but was not recognized.  [place] vasc score is 3. Recommend continued anticoagulation with Xarelto 20 mg daily. Stop ASA and Plavix. Recommend continued total abstinence from alcohol. Avoid NSAIDs. Will schedule for DCCV in 1-2 weeks. Update BMET and CBC. Procedure reviewed with patient and wife including risk of CVA and other arrhythmias. They are agreeable to proceed. With normal Echo I am hopeful that he will be able to maintain NSR if he can stay sober.   3. Hyperlipidemia  on simvastatin.   4. History of Mobitz type 1 second degree AV block.   5. HTN controlled  6. Gout. Exacerbated by recent Etoh use. S/p surgical drainage of right knee bursa.    7. Etoh abuse. Recommend total abstinence.

## 2015-03-03 NOTE — Interval H&P Note (Signed)
History and Physical Interval Note:  03/03/2015 12:09 PM  Gregory Wheeler  has presented today for surgery, with the diagnosis of A FIB. On arrival, he was found to have returned to NSR. Procedure cancelled.  [name], MD, [facility] [phone] office [phone] pager

## 2015-03-10 DIAGNOSIS — M7041 Prepatellar bursitis, right knee: Secondary | ICD-10-CM | POA: Diagnosis not present

## 2015-03-10 DIAGNOSIS — M1A0611 Idiopathic chronic gout, right knee, with tophus (tophi): Secondary | ICD-10-CM | POA: Diagnosis not present

## 2015-03-21 ENCOUNTER — Other Ambulatory Visit: Payer: Self-pay | Admitting: Cardiology

## 2015-03-21 NOTE — Telephone Encounter (Signed)
Rx(s) sent to pharmacy electronically.  

## 2015-04-01 ENCOUNTER — Ambulatory Visit: Payer: 59 | Admitting: Cardiology

## 2015-04-07 ENCOUNTER — Other Ambulatory Visit: Payer: Self-pay

## 2015-04-07 ENCOUNTER — Ambulatory Visit (INDEPENDENT_AMBULATORY_CARE_PROVIDER_SITE_OTHER): Payer: Medicare Other | Admitting: Cardiology

## 2015-04-07 ENCOUNTER — Encounter: Payer: Self-pay | Admitting: Cardiology

## 2015-04-07 VITALS — BP 110/82 | HR 90 | Ht 70.0 in | Wt 225.6 lb

## 2015-04-07 DIAGNOSIS — I2510 Atherosclerotic heart disease of native coronary artery without angina pectoris: Secondary | ICD-10-CM | POA: Diagnosis not present

## 2015-04-07 DIAGNOSIS — I4510 Unspecified right bundle-branch block: Secondary | ICD-10-CM

## 2015-04-07 DIAGNOSIS — I480 Paroxysmal atrial fibrillation: Secondary | ICD-10-CM | POA: Diagnosis not present

## 2015-04-07 DIAGNOSIS — I441 Atrioventricular block, second degree: Secondary | ICD-10-CM | POA: Diagnosis not present

## 2015-04-07 DIAGNOSIS — I10 Essential (primary) hypertension: Secondary | ICD-10-CM

## 2015-04-07 MED ORDER — ALPRAZOLAM 0.25 MG PO TABS: 0.2500 mg | ORAL_TABLET | Freq: Three times a day (TID) | ORAL | Status: DC | PRN

## 2015-04-07 NOTE — Patient Instructions (Signed)
Continue your current therapy  I will see you in 6 months.   

## 2015-04-07 NOTE — Progress Notes (Signed)
Gregory Wheeler Date of Birth: [birth year]   History of Present Illness: Mr. Kraynak is seen for follow up of CAD and atrial fibrillation.  He has a history of coronary disease and is status post stenting of the LAD with overlapping Taxus stents in 2009. His last myoview in April 2015 showed mild apical thinning, normal EF 67%.  He  was seen in the ED in February for symptoms of left arm numbness and pain. This was worse with lifting his arm. Ecg was done and demonstrated tachycardia with no clear P waves and rate 113. He was treated with a muscle relaxant.  He was seen in June 2016 symptoms of fatigue and acute gout flair in his right knee bursa. He was noted to be in atrial fibrillation with RVR (and review of ECG from February showed the same). He had been drinking Etoh heavily. He was started on metoprolol and Xarelto. He has abstained from [place]. He had surgical excision and drainage of his right Knee by Dr. Rayburn Ma. He was scheduled for a DCCV on 03/03/15 but on arrival he was in NSR with rate 77. Procedure was cancelled. Today he denies any complaints. No SOB or chest pain. No dizziness or palpitations.    Current Outpatient Prescriptions on File Prior to Visit  Medication Sig Dispense Refill  . allopurinol (ZYLOPRIM) 300 MG tablet Take 600 mg by mouth daily.     . colchicine 0.6 MG tablet Take 1 tablet (0.6 mg total) by mouth daily. 60 tablet 3  . losartan (COZAAR) 25 MG tablet TAKE 1 TABLET EVERY DAY 90 tablet 2  . metoprolol succinate (TOPROL-XL) 50 MG 24 hr tablet Take 1 tablet (50 mg total) by mouth daily. Take with or immediately following a meal. 30 tablet 6  . rivaroxaban (XARELTO) 20 MG TABS tablet Take 1 tablet (20 mg total) by mouth daily with supper. (Patient taking differently: Take 20 mg by mouth daily. ) 30 tablet 6  . simvastatin (ZOCOR) 40 MG tablet Take 1 tablet (40 mg total) by mouth at bedtime. (Patient taking differently: Take 40 mg by mouth daily. ) 90 tablet 1   No  current facility-administered medications on file prior to visit.    No Known Allergies  Past Medical History  Diagnosis Date  . Coronary artery disease 12/2007    STATUS POST STENTING OF THE LEFT ANTERIOR DESCENDING ARTERY  . Hypertension   . Hyperlipidemia   . SBO (small bowel obstruction)     PARTIAL  . LBP (low back pain)   . Arthritis   . Gout   . RBBB (right bundle branch block)   . Partial small bowel obstruction   . Hiatal hernia   . Chronic gout   . Urinary tract infection     Escherichia coli urinary tract infection  . Dyslipidemia   . Second degree AV block, Mobitz type I   . Atrial fibrillation 01/31/2015    Past Surgical History  Procedure Laterality Date  . Hernia repair      INGUINAL  . Rotator cuff repair    . Cardiac catheterization      History  Smoking status  . Former Smoker  . Quit date: 08/27/1990  Smokeless tobacco  . Not on file    History  Alcohol Use  . Yes    Comment: Drinks every day.    Family History  Problem Relation Age of Onset  . Emphysema Mother 64  . Heart attack Father 35  Review of Systems: As noted in HPI All other systems were reviewed and are negative.  Physical Exam: BP 110/82 mmHg  Pulse 90  Ht  (1.778 m)  Wt 102.331 kg (225 lb 9.6 oz)  BMI 32.37 kg/m2 He is an overweight white male in no acute distress. He has no JVD or bruits. HEENT exam is unremarkable. PERRLA, sclera are clear. Oropharynx is clear. Lungs are clear. Cardiac exam reveals an irregular rate and rhythm without gallop or murmur. Abdomen is obese, soft, nontender. He has tr edema.  Pedal pulses are 2+ and symmetric. Right knee is much less swollen.  He is alert and oriented x3. Cranial nerves II through XII are intact.  LABORATORY DATA: Lab Results  Component Value Date   WBC 9.1 03/01/2015   HGB 13.3 03/01/2015   HCT 41.0 03/01/2015   PLT 245.0 03/01/2015   GLUCOSE 117* 03/01/2015   CHOL 128 08/24/2014   TRIG 70 08/24/2014   HDL  57 08/24/2014   LDLCALC 57 08/24/2014   ALT 26 02/04/2015   AST 24 02/04/2015   NA 139 03/01/2015   K 3.6 03/01/2015   CL 105 03/01/2015   CREATININE 1.26 03/01/2015   BUN 13 03/01/2015   CO2 26 03/01/2015   TSH 1.927 01/31/2015   INR 1.4* 03/01/2015   Echo: 02/09/15  Study Conclusions  - Left ventricle: The cavity size was normal. There was mild focal basal hypertrophy of the septum. Systolic function was normal. The estimated ejection fraction was in the range of 60% to 65%. Wall motion was normal; there were no regional wall motion abnormalities. - Aortic valve: Trileaflet; normal thickness, mildly calcified leaflets. - Mitral valve: Calcified annulus. - Pulmonic valve: There was trivial regurgitation.  Ecg today shows atrial fibrillation with rate 90. RBBB. I have personally reviewed and interpreted this study.   Assessment / Plan: 1. Coronary disease status post stenting of the LAD in 2009 with Taxus stents. Nuclear stress test in April 2015 showed a small apical infarct with normal ejection fraction. Continue with risk factor modification. Antiplatelet therapy stopped now that he is on Xarelto.  2. Atrial fibrillation- paroxysmal. Rate now improved on metoprolol. It appears that he is going in and out of Afib. He is asymptomatic so I would recommend continuing therapy with rate control and anticoagulation.  [place] vasc score is 3. . Recommend continued  abstinence from alcohol. Avoid NSAIDs.   3. Hyperlipidemia  on simvastatin.   4. History of Mobitz type 1 second degree AV block.   5. HTN controlled  6. Gout. Exacerbated by recent Etoh use. S/p surgical drainage of right knee bursa.   7. Etoh abuse. Recommend total abstinence.   I will follow up in 6 months.

## 2015-04-13 ENCOUNTER — Other Ambulatory Visit: Payer: Self-pay | Admitting: *Deleted

## 2015-04-13 MED ORDER — METOPROLOL SUCCINATE ER 50 MG PO TB24: 50.0000 mg | ORAL_TABLET | Freq: Every day | ORAL | Status: DC

## 2015-04-15 ENCOUNTER — Other Ambulatory Visit: Payer: Self-pay | Admitting: *Deleted

## 2015-04-15 MED ORDER — METOPROLOL SUCCINATE ER 50 MG PO TB24: 50.0000 mg | ORAL_TABLET | Freq: Every day | ORAL | Status: DC

## 2015-04-21 ENCOUNTER — Other Ambulatory Visit: Payer: Self-pay | Admitting: *Deleted

## 2015-04-21 ENCOUNTER — Other Ambulatory Visit: Payer: Self-pay

## 2015-04-21 ENCOUNTER — Telehealth: Payer: Self-pay

## 2015-04-21 DIAGNOSIS — I4819 Other persistent atrial fibrillation: Secondary | ICD-10-CM

## 2015-04-21 MED ORDER — ALPRAZOLAM 0.25 MG PO TABS: 0.2500 mg | ORAL_TABLET | Freq: Three times a day (TID) | ORAL | Status: DC | PRN

## 2015-04-21 NOTE — Telephone Encounter (Signed)
Spoke to patient received message you needed a refill for xanax.Dr.Jordan advised ok to refill.Refill called to pharmacy.Patient stated he wanted to report pulse has been fast ranging 123,118,119,109,100,90.B/P 87/57,100/70,110/72.Spoke to Dr.Jordan he advised continue current medications and monitor, if pulse get over 120 to call back.

## 2015-04-30 ENCOUNTER — Telehealth: Payer: Self-pay | Admitting: Cardiology

## 2015-04-30 NOTE — Telephone Encounter (Signed)
Paged by Ms. Earhart re: her husbands BP.  She states that earlier in the day he felt a little dizzy but no other symptoms. Specifically denies, SOB, cough, fevers, chills, sweats, dysuria, diarrhea, melena. No syncope or presyncope. Hematochezia. Initial reading 81/57, P103. Recheck a bit later: 70/52 104. Had one BP as low as 68/56 P 103. Apparently he just felt a little lightheaded.  Before she called me, she rechecked the vitals and it was 95/80 P 95.  She said he didn't have much to eat today, although that is not out of the ordinary.  She has not checked the BP cuff in the office but states that she had been getting readings similar to those obtained during a recent office visit.   We discussed that this most likely represented some orthostasis from hypovolemia in setting of losartan and metoprolol although there are some less likely but more serious etiologies. Since Mr. Husted feels ok and BP is fine, he does not require immediate evaluation.  I recommended he hydrate with 2-3 glasses of water. She will check BP in AM, with the following plan for losartan and metoprolol:  If SBP < 110, do not take losartan If SBP <100, do not take metoprolol  If any further BP readings are <80 or he has any symptoms, she will either page [place] or call EMS if BP is much lower or symptoms are severe.  If BP improves, she will call the office on Tuesday to report AM BPs and how many metoprolol and losartan doses he was able to take.  Based on this, medications may require modification. All questions were answered.

## 2015-06-15 ENCOUNTER — Other Ambulatory Visit: Payer: Self-pay | Admitting: Cardiology

## 2015-07-11 ENCOUNTER — Telehealth: Payer: Self-pay | Admitting: Cardiology

## 2015-07-11 NOTE — Telephone Encounter (Signed)
Mrs. Burnett ShengHedrick is calling to find out sbout some new medication .Marland Kitchen. Please call

## 2015-07-11 NOTE — Telephone Encounter (Signed)
Returned call to patient's wife.She stated husband is in doughnut hole.Xarelto 20 mg samples left at front desk of [facility] office.

## 2015-08-01 ENCOUNTER — Telehealth: Payer: Self-pay | Admitting: Cardiology

## 2015-08-01 NOTE — Telephone Encounter (Signed)
Gregory Wheeler states she has questions regarding her husband's medications.

## 2015-08-01 NOTE — Telephone Encounter (Signed)
Wife wants some samples of Colchicine and Xarelto please. He need enough until February for the Xarelto please.

## 2015-08-01 NOTE — Telephone Encounter (Signed)
Returned call to patient's wife.She stated husband needs samples of xarelto 20 mg and cochicine.Advised patient should not be taking colchicine every day.Advised only to take if needed for gout flair up.Samples of xarelto 20 mg left at [facility] front desk.Drug Rep does not leave colchicine samples.

## 2015-08-01 NOTE — Telephone Encounter (Signed)
Returned call to patient's wife no answer.LMTC. 

## 2015-08-01 NOTE — Telephone Encounter (Signed)
Returning your call. °

## 2015-08-10 ENCOUNTER — Telehealth: Payer: Self-pay | Admitting: Cardiology

## 2015-08-10 NOTE — Telephone Encounter (Signed)
Please call,question about his gout medicine. °

## 2015-08-11 NOTE — Telephone Encounter (Signed)
Returned call to patient 08/10/15 spoke to wife.She stated husband had gout in knee again.Advised he can take colchicine 0.6 mg daily as needed.Advised he needs to see PCP.

## 2015-08-22 ENCOUNTER — Other Ambulatory Visit: Payer: Self-pay | Admitting: Cardiology

## 2015-09-07 ENCOUNTER — Telehealth: Payer: Self-pay | Admitting: Cardiology

## 2015-09-07 DIAGNOSIS — I4819 Other persistent atrial fibrillation: Secondary | ICD-10-CM

## 2015-09-07 MED ORDER — RIVAROXABAN 20 MG PO TABS: 20.0000 mg | ORAL_TABLET | Freq: Every day | ORAL | Status: DC

## 2015-09-07 NOTE — Telephone Encounter (Signed)
Samples provided at front desk, pt informed.

## 2015-09-07 NOTE — Telephone Encounter (Signed)
Patient calling the office for samples of medication: ° ° °1.  What medication and dosage are you requesting samples for? Xarelto °2.  Are you currently out of this medication? Yes ° ° ° °

## 2015-09-26 ENCOUNTER — Telehealth: Payer: Self-pay

## 2015-09-26 NOTE — Telephone Encounter (Signed)
Received call from patient's wife requesting Xarelto 20 mg samples.Xarelto 20 mg samples left at front desk of [facility] office.

## 2015-10-05 ENCOUNTER — Telehealth: Payer: Self-pay | Admitting: *Deleted

## 2015-10-05 NOTE — Telephone Encounter (Signed)
Per voicemail, patients wife would like to know if the patient can get generic colchicine. Call back number provided [phone]. Please advise. Thanks, MI

## 2015-10-06 MED ORDER — COLCHICINE 0.6 MG PO TABS: ORAL_TABLET | ORAL | Status: DC

## 2015-10-06 NOTE — Telephone Encounter (Signed)
Returned call to patient's wife.She stated colchicine cost too much.Advised colchicine is generic.Advised I will call [employer] for a tier reduction.

## 2015-10-06 NOTE — Telephone Encounter (Signed)
Spoke to [facility] to request a tier reduction for colchicine.ID # [id number].Colchicine not on patient's formulary.Prior Authorization started will be notified within 48 hours.

## 2015-10-10 MED ORDER — COLCHICINE 0.6 MG PO TABS: ORAL_TABLET | ORAL | Status: DC

## 2015-10-10 NOTE — Addendum Note (Signed)
Addended by: Neoma Laming on: 10/10/2015 12:48 PM   Modules accepted: Orders, Medications

## 2015-10-10 NOTE — Telephone Encounter (Signed)
Spoke to pharmacist at [facility] he stated they do have colcrys.Stated colcrys will be less expensive 30 day supply $42.Stated he will fill prescription for colcrys 0.6 mg as needed.

## 2015-10-10 NOTE — Telephone Encounter (Signed)
Received fax from [facility] denied approval of Colchicine.Covers Colcrys.

## 2015-10-14 ENCOUNTER — Encounter: Payer: Self-pay | Admitting: Cardiology

## 2015-10-14 ENCOUNTER — Other Ambulatory Visit: Payer: Self-pay | Admitting: Cardiology

## 2015-10-14 ENCOUNTER — Ambulatory Visit (INDEPENDENT_AMBULATORY_CARE_PROVIDER_SITE_OTHER): Payer: Medicare Other | Admitting: Cardiology

## 2015-10-14 VITALS — BP 142/90 | HR 68 | Ht 69.0 in | Wt 236.7 lb

## 2015-10-14 DIAGNOSIS — I441 Atrioventricular block, second degree: Secondary | ICD-10-CM | POA: Diagnosis not present

## 2015-10-14 DIAGNOSIS — I2510 Atherosclerotic heart disease of native coronary artery without angina pectoris: Secondary | ICD-10-CM

## 2015-10-14 DIAGNOSIS — I4510 Unspecified right bundle-branch block: Secondary | ICD-10-CM

## 2015-10-14 DIAGNOSIS — I481 Persistent atrial fibrillation: Secondary | ICD-10-CM | POA: Diagnosis not present

## 2015-10-14 DIAGNOSIS — I10 Essential (primary) hypertension: Secondary | ICD-10-CM

## 2015-10-14 DIAGNOSIS — I4819 Other persistent atrial fibrillation: Secondary | ICD-10-CM

## 2015-10-14 MED ORDER — RIVAROXABAN 20 MG PO TABS: 20.0000 mg | ORAL_TABLET | Freq: Every day | ORAL | Status: DC

## 2015-10-14 NOTE — Addendum Note (Signed)
Addended by: Neoma Laming on: 10/14/2015 12:33 PM   Modules accepted: Orders

## 2015-10-14 NOTE — Progress Notes (Signed)
Gregory Wheeler Date of Birth: [birth year]   History of Present Illness: Gregory Wheeler is seen for follow up of CAD and atrial fibrillation.  He has a history of coronary disease and is status post stenting of the LAD with overlapping Taxus stents in 2009. His last myoview in April 2015 showed mild apical thinning, normal EF 67%.  He  was seen in the ED in February for symptoms of left arm numbness and pain. This was worse with lifting his arm. Ecg was done and demonstrated tachycardia with no clear P waves and rate 113. He was treated with a muscle relaxant.  He was seen in June 2016 symptoms of fatigue and acute gout flair in his right knee bursa. He was noted to be in atrial fibrillation with RVR (and review of ECG from February showed the same). He had been drinking Etoh heavily. He was started on metoprolol and Xarelto.  He had surgical excision and drainage of his right Knee by Dr. Rayburn Ma. He was scheduled for a DCCV on 03/03/15 but on arrival he was in NSR with rate 77.   On follow up today he denies any complaints. No SOB or chest pain. No dizziness or palpitations. Gout has been controlled. He is back drinking more and has gained 11 lbs.     Current Outpatient Prescriptions on File Prior to Visit  Medication Sig Dispense Refill  . allopurinol (ZYLOPRIM) 300 MG tablet Take 600 mg by mouth daily.     Marland Kitchen ALPRAZolam (XANAX) 0.25 MG tablet Take 1 tablet (0.25 mg total) by mouth 3 (three) times daily as needed for anxiety. 30 tablet 2  . colchicine (COLCRYS) 0.6 MG tablet Take 0.6 mg daily if needed for gout. 30 tablet 6  . losartan (COZAAR) 25 MG tablet TAKE 1 TABLET EVERY DAY 90 tablet 2  . metoprolol succinate (TOPROL-XL) 50 MG 24 hr tablet TAKE 1 TABLET DAILY WITH OR IMMEDIATELY FOLLOWING A MEAL. 90 tablet 1  . simvastatin (ZOCOR) 40 MG tablet TAKE 1 TABLET (40 MG TOTAL) BY MOUTH AT BEDTIME. 90 tablet 3   No current facility-administered medications on file prior to visit.    No Known  Allergies  Past Medical History  Diagnosis Date  . Coronary artery disease 12/2007    STATUS POST STENTING OF THE LEFT ANTERIOR DESCENDING ARTERY  . Hypertension   . Hyperlipidemia   . SBO (small bowel obstruction) (HCC)     PARTIAL  . LBP (low back pain)   . Arthritis   . Gout   . RBBB (right bundle branch block)   . Partial small bowel obstruction (HCC)   . Hiatal hernia   . Chronic gout   . Urinary tract infection     Escherichia coli urinary tract infection  . Dyslipidemia   . Second degree AV block, Mobitz type I   . Atrial fibrillation (HCC) 01/31/2015    Past Surgical History  Procedure Laterality Date  . Hernia repair      INGUINAL  . Rotator cuff repair    . Cardiac catheterization      History  Smoking status  . Former Smoker  . Quit date: 08/27/1990  Smokeless tobacco  . Not on file    History  Alcohol Use  . Yes    Comment: Drinks every day.    Family History  Problem Relation Age of Onset  . Emphysema Mother 72  . Heart attack Father 67    Review of Systems: As noted  in HPI All other systems were reviewed and are negative.  Physical Exam: BP 142/90 mmHg  Pulse 68  Ht  (1.753 m)  Wt 107.366 kg (236 lb 11.2 oz)  BMI 34.94 kg/m2 He is an obese white male in no acute distress. He has no JVD or bruits. HEENT exam is unremarkable. PERRLA, sclera are clear. Oropharynx is clear. Lungs are clear. Cardiac exam reveals an irregular rate and rhythm without gallop or murmur. Abdomen is obese, soft, nontender. He has no edema.  Pedal pulses are 2+ and symmetric. Right knee is much less swollen.  He is alert and oriented x3. Cranial nerves II through XII are intact.  LABORATORY DATA: Lab Results  Component Value Date   WBC 9.1 03/01/2015   HGB 13.3 03/01/2015   HCT 41.0 03/01/2015   PLT 245.0 03/01/2015   GLUCOSE 117* 03/01/2015   CHOL 128 08/24/2014   TRIG 70 08/24/2014   HDL 57 08/24/2014   LDLCALC 57 08/24/2014   ALT 26 02/04/2015   AST  24 02/04/2015   NA 139 03/01/2015   K 3.6 03/01/2015   CL 105 03/01/2015   CREATININE 1.26 03/01/2015   BUN 13 03/01/2015   CO2 26 03/01/2015   TSH 1.927 01/31/2015   INR 1.4* 03/01/2015   Echo: 02/09/15  Study Conclusions  - Left ventricle: The cavity size was normal. There was mild focal basal hypertrophy of the septum. Systolic function was normal. The estimated ejection fraction was in the range of 60% to 65%. Wall motion was normal; there were no regional wall motion abnormalities. - Aortic valve: Trileaflet; normal thickness, mildly calcified leaflets. - Mitral valve: Calcified annulus. - Pulmonic valve: There was trivial regurgitation.   Assessment / Plan: 1. Coronary disease status post stenting of the LAD in 2009 with Taxus stents. Nuclear stress test in April 2015 showed a small apical infarct with normal ejection fraction. He is asymptomatic.  Continue with risk factor modification. Antiplatelet therapy stopped now that he is on Xarelto.  2. Atrial fibrillation- paroxysmal. In NSR today.  He is asymptomatic so I would recommend continuing therapy with rate control and anticoagulation.  [place] vasc score is 3. . Recommend continued  abstinence from alcohol. Avoid NSAIDs.   3. Hyperlipidemia  on simvastatin.   4. History of Mobitz type 1 second degree AV block.   5. HTN controlled  6. Gout. Controlled.  7. Etoh abuse. Recommend total abstinence.   I will follow up in 6 months with lab work including CBC, chemistries, lipids, and uric acid.

## 2015-10-14 NOTE — Patient Instructions (Signed)
Continue your current therapy  You need to lose weight and limit alcohol intake.  I will see you in 6 months.

## 2015-10-17 ENCOUNTER — Other Ambulatory Visit: Payer: Self-pay | Admitting: Cardiology

## 2015-10-17 NOTE — Telephone Encounter (Signed)
REFILL 

## 2015-11-14 ENCOUNTER — Telehealth: Payer: Self-pay | Admitting: *Deleted

## 2015-11-14 ENCOUNTER — Other Ambulatory Visit: Payer: Self-pay | Admitting: Cardiology

## 2015-11-14 NOTE — Telephone Encounter (Signed)
Patients wife calling to speak with Anabel Halonheryl Pugh in regards to getting some more samples of xarelto for the patient.

## 2015-11-14 NOTE — Telephone Encounter (Signed)
Medication samples have been provided to the patient.  Drug name: xarelto 20mg   Qty: 28 tablets (4 bottles)  LOT: [id number]  Exp.Date: 03/2018  Samples left at front desk for patient pick-up. Patient notified - Marcello FennelLM  Lindell Sparlkins, Emmanuella Mirante M 3:56 PM 11/14/2015

## 2015-11-18 ENCOUNTER — Other Ambulatory Visit: Payer: Self-pay | Admitting: Cardiology

## 2015-11-23 NOTE — Telephone Encounter (Signed)
Spoke to pharmacy tech at [facility] advised to call PCP for xanax refill.

## 2015-11-24 ENCOUNTER — Other Ambulatory Visit: Payer: Self-pay | Admitting: Cardiology

## 2015-11-25 NOTE — Telephone Encounter (Signed)
Spoke to pharmacy tech at [facility] [address] advised send Xanax refill to PCP.

## 2015-12-06 ENCOUNTER — Telehealth: Payer: Self-pay

## 2015-12-06 NOTE — Telephone Encounter (Signed)
Received alprazolam refill request from [facility].Advised to send refill to PCP.

## 2015-12-12 ENCOUNTER — Telehealth: Payer: Self-pay | Admitting: Cardiology

## 2015-12-12 DIAGNOSIS — I4819 Other persistent atrial fibrillation: Secondary | ICD-10-CM

## 2015-12-12 NOTE — Telephone Encounter (Signed)
Follow Up  Pt wife is calling back to follow up on the appts that were offered for Tuesday after 1p and or Wednesday. Checked the schedule no avail slots at this time. Please advise

## 2015-12-12 NOTE — Telephone Encounter (Signed)
New Message  Pt wife called. Request a call back, states that the pt hasn't any energy. She states that his energy is zapped/ Offered next avail for 12/13/15 at 9:30a with Ward Givenshris Berge. Pt wife declined. Reports that the pt has to work. Request a call back to discuss same day appt. Please call

## 2015-12-12 NOTE — Telephone Encounter (Signed)
Spoke to wife. Pt has fatigue, SOB x ~ 2 weeks. No recent med changes. No S&S acute illness. They cannot take an appt tomorrow AM due to a conflict of scheduling, she does state he would be available after 1pm, or for full day on Wednesday.

## 2015-12-13 ENCOUNTER — Ambulatory Visit: Payer: Medicare Other | Admitting: Physician Assistant

## 2015-12-13 MED ORDER — RIVAROXABAN 20 MG PO TABS: 20.0000 mg | ORAL_TABLET | Freq: Every day | ORAL | Status: DC

## 2015-12-13 NOTE — Telephone Encounter (Signed)
Called wife back - she is aware I am working on this and will find out why my requests for schedule add are getting bounced/unread.  She is also requesting Xarelto samples,availability for which I will check on.

## 2015-12-13 NOTE — Telephone Encounter (Signed)
Lupita LeashDonna helped me w/ clinic appt scheduling.  Communicated w/ wife & patient & confirmed appt info - flex visit today at 3pm w/ Scott to discuss incr SOB, fatigue x1-2 weeks.

## 2015-12-13 NOTE — Telephone Encounter (Signed)
Follow up  Pt wife is calling for rn  Pt wife states that she called before for an appt for her husband and no one returned her call she is asking that Elnita MaxwellCheryl return her call

## 2015-12-14 ENCOUNTER — Other Ambulatory Visit: Payer: Self-pay | Admitting: Cardiology

## 2015-12-20 ENCOUNTER — Ambulatory Visit: Payer: Medicare Other | Admitting: Physician Assistant

## 2015-12-21 ENCOUNTER — Encounter: Payer: Self-pay | Admitting: Physician Assistant

## 2015-12-21 ENCOUNTER — Ambulatory Visit (INDEPENDENT_AMBULATORY_CARE_PROVIDER_SITE_OTHER): Payer: Medicare Other | Admitting: Physician Assistant

## 2015-12-21 VITALS — BP 120/70 | HR 94 | Ht 69.0 in | Wt 237.0 lb

## 2015-12-21 DIAGNOSIS — R5383 Other fatigue: Secondary | ICD-10-CM | POA: Insufficient documentation

## 2015-12-21 DIAGNOSIS — Z79899 Other long term (current) drug therapy: Secondary | ICD-10-CM

## 2015-12-21 DIAGNOSIS — I10 Essential (primary) hypertension: Secondary | ICD-10-CM

## 2015-12-21 DIAGNOSIS — I952 Hypotension due to drugs: Secondary | ICD-10-CM

## 2015-12-21 DIAGNOSIS — I2510 Atherosclerotic heart disease of native coronary artery without angina pectoris: Secondary | ICD-10-CM

## 2015-12-21 DIAGNOSIS — I959 Hypotension, unspecified: Secondary | ICD-10-CM | POA: Insufficient documentation

## 2015-12-21 LAB — BASIC METABOLIC PANEL
BUN: 21 mg/dL (ref 7–25)
CALCIUM: 9.6 mg/dL (ref 8.6–10.3)
CO2: 26 mmol/L (ref 20–31)
CREATININE: 1.37 mg/dL — AB (ref 0.70–1.18)
Chloride: 102 mmol/L (ref 98–110)
Glucose, Bld: 87 mg/dL (ref 65–99)
Potassium: 4.5 mmol/L (ref 3.5–5.3)
SODIUM: 141 mmol/L (ref 135–146)

## 2015-12-21 NOTE — Progress Notes (Signed)
Patient ID: Gregory GlazierJack S Kirsch, male   DOB: [birth year], 75 y.o.   MRN: [id number]    Date:  12/21/2015   ID:  Gregory Wheeler, DOB [birth year], MRN [id number]  PCP:  Pamelia HoitWILSON,FRED HENRY, MD  Primary Cardiologist:  [place]  Chief Complaint  Patient presents with  . Shortness of Breath    Onset 2 weeks, no energy, cramping in legs, some lightheadedness, BP has been dropping, 91 over 58     History of Present Illness: Gregory GlazierJack S Vanbrocklin is a 75 y.o. male with a history of coronary artery disease, atrial fibrillation, hypertension, hyperlipidemia, right bundle branch block, second-degree AV block Mobitz type I, alcohol abuse.  He is status post stenting of the LAD with overlapping Taxus stents in 2009. His last myoview in April 2015 showed mild apical thinning, normal EF 67%. He was seen in the ED in February for symptoms of left arm numbness and pain. This was worse with lifting his arm. Ecg was done and demonstrated tachycardia with no clear P waves and rate 113. He was treated with a muscle relaxant.   He was seen in June 2016 symptoms of fatigue and acute gout flair in his right knee bursa. He was noted to be in atrial fibrillation with RVR (and review of ECG from February showed the same). He had been drinking Etoh heavily. He was started on metoprolol and Xarelto. He had surgical excision and drainage of his right Knee by Dr. Rayburn MaBlackmon. He was scheduled for a DCCV on 03/03/15 but on arrival he was in NSR with rate 77.  Patient presents today for evaluation of SOB and fatigue.  Patient reports for the last couple of weeks he's felt this way and lastly we checked his blood pressure was 91/58 and its been running low. Over the last several days he's been taking his wife's furosemide because he thought he was gaining fluid.  Today he's felt much better.  He has not had any orthopnea, PND, lower extremity edema. He also says he has not been eating any more than he normally does.  The patient currently denies  nausea, vomiting, fever, chest pain, shortness of breath, orthopnea, dizziness, PND, cough, congestion, abdominal pain, hematochezia, melena, lower extremity edema, claudication.  Wt Readings from Last 3 Encounters:  12/21/15 237 lb (107.502 kg)  10/14/15 236 lb 11.2 oz (107.366 kg)  04/07/15 225 lb 9.6 oz (102.331 kg)     Past Medical History  Diagnosis Date  . Coronary artery disease 12/2007    STATUS POST STENTING OF THE LEFT ANTERIOR DESCENDING ARTERY  . Hypertension   . Hyperlipidemia   . SBO (small bowel obstruction) (HCC)     PARTIAL  . LBP (low back pain)   . Arthritis   . Gout   . RBBB (right bundle branch block)   . Partial small bowel obstruction (HCC)   . Hiatal hernia   . Chronic gout   . Urinary tract infection     Escherichia coli urinary tract infection  . Dyslipidemia   . Second degree AV block, Mobitz type I   . Atrial fibrillation (HCC) 01/31/2015    Current Outpatient Prescriptions  Medication Sig Dispense Refill  . allopurinol (ZYLOPRIM) 300 MG tablet Take 600 mg by mouth daily.     Marland Kitchen. ALPRAZolam (XANAX) 0.25 MG tablet Take 1 tablet (0.25 mg total) by mouth 3 (three) times daily as needed for anxiety. 30 tablet 2  . colchicine (COLCRYS) 0.6 MG tablet Take 0.6 mg daily if  needed for gout. 30 tablet 6  . losartan (COZAAR) 25 MG tablet TAKE 1 TABLET EVERY DAY 90 tablet 1  . metoprolol succinate (TOPROL-XL) 50 MG 24 hr tablet TAKE 1 TABLET DAILY WITH OR IMMEDIATELY FOLLOWING A MEAL. 90 tablet 1  . rivaroxaban (XARELTO) 20 MG TABS tablet Take 1 tablet (20 mg total) by mouth daily. 28 tablet 0  . simvastatin (ZOCOR) 40 MG tablet TAKE 1 TABLET (40 MG TOTAL) BY MOUTH AT BEDTIME. 90 tablet 3   No current facility-administered medications for this visit.    Allergies:   No Known Allergies  Social History:  The patient  reports that he quit smoking about 25 years ago. He does not have any smokeless tobacco history on file. He reports that he drinks alcohol. He  reports that he does not use illicit drugs.   Family history:   Family History  Problem Relation Age of Onset  . Emphysema Mother 78  . Heart attack Father 23    ROS:  Please see the history of present illness.  All other systems reviewed and negative.   PHYSICAL EXAM: VS:  BP 120/70 mmHg  Pulse 94  Ht  (1.753 m)  Wt 237 lb (107.502 kg)  BMI 34.98 kg/m2 Well nourished, well developed, in no acute distress HEENT: Pupils are equal round react to light accommodation extraocular movements are intact.  Neck: no JVDNo cervical lymphadenopathy. Cardiac: Regular rate and rhythm without murmurs rubs or gallops. Lungs:  clear to auscultation bilaterally, no wheezing, rhonchi or rales Abd: soft, nontender, positive bowel sounds all quadrants, no hepatosplenomegaly Ext: no lower extremity edema.  2+ radial and dorsalis pedis pulses. Skin: warm and dry Neuro:  Grossly normal  EKG:   Atrial fibrillation with a rate of 94 bpm right bundle branch block.  ASSESSMENT AND PLAN:  Problem List Items Addressed This Visit    Hypotension   Hypertension   Relevant Orders   EKG 12-Lead   Fatigue   Coronary artery disease - Primary   Relevant Orders   EKG 12-Lead    Other Visit Diagnoses    Medication management        Relevant Orders    Basic Metabolic Panel (BMET)       It is most likely that Mr Weisensel is feeling fatigued and tired because of low blood pressure.  His blood pressure is better today and he says it felt better the last 2 days. He thought he was gaining fluid weight and has been taking his wife's furosemide periodically over the last several days. He took 40 mg today. I asked him to stop doing this. I'll check a basic metabolic panel to check his kidney function and potassium levels since he was likely dehydrated. He also drinks a large amount of alcohol.  We discussed what an appropriate blood pressure reading is.  Followup in august.

## 2015-12-21 NOTE — Patient Instructions (Signed)
Your physician recommends that you schedule a follow-up appointment in: With Dr [place] as previously schedule  Your physician recommends that you return for lab work in: BMP  Do not take your wife Furosemide

## 2015-12-28 ENCOUNTER — Telehealth: Payer: Self-pay | Admitting: Cardiology

## 2015-12-28 NOTE — Telephone Encounter (Signed)
Pt called in wanting to get the results to the labs he had taken last week. Please f/u with the pt  Thanks

## 2015-12-28 NOTE — Telephone Encounter (Signed)
Spoke with pt wife, aware lab work has not been review. Preliminary results given to wife. Advised to continue not to take the furosemide and we will call back once reviewed.

## 2016-01-02 ENCOUNTER — Ambulatory Visit: Payer: Medicare Other | Admitting: Physician Assistant

## 2016-01-02 ENCOUNTER — Encounter: Payer: Self-pay | Admitting: *Deleted

## 2016-01-12 ENCOUNTER — Telehealth: Payer: Self-pay | Admitting: Cardiology

## 2016-01-12 NOTE — Telephone Encounter (Signed)
New message ° ° ° ° °Patient calling the office for samples of medication: ° ° °1.  What medication and dosage are you requesting samples for? Xarelto 20 mg po taking once daily ° °2.  Are you currently out of this medication? yes ° ° °

## 2016-01-12 NOTE — Telephone Encounter (Signed)
Spoke to patient's wife Informed her no samples available She states medication is over $150 a month. RN suggest to wife to call insurance to find out what is on formulary for lower price. Call back with information. She may call next week to see if samples are available. She verbalized understanding.

## 2016-01-13 ENCOUNTER — Telehealth: Payer: Self-pay | Admitting: Cardiology

## 2016-01-13 DIAGNOSIS — I4819 Other persistent atrial fibrillation: Secondary | ICD-10-CM

## 2016-01-13 NOTE — Telephone Encounter (Signed)
New message       *STAT* If patient is at the pharmacy, call can be transferred to refill team.   1. Which medications need to be refilled? (please list name of each medication and dose if known) Xarelto 20 mg po once daily  2. Which pharmacy/location (including street and city if local pharmacy) is medication to be sent to?[facility] located on [address]  3. Do they need a 30 day or 90 day supply? 30 day   The pt wife called the [facility] on [address] but the pharmacy tech said the Md will have to call the prescription/  Patient calling the office for samples of medication:   1.  What medication and dosage are you requesting samples for? Xarelto 20 mg po once   2.  Are you currently out of this medication? yes

## 2016-01-16 ENCOUNTER — Other Ambulatory Visit: Payer: Self-pay | Admitting: Cardiology

## 2016-01-16 MED ORDER — RIVAROXABAN 20 MG PO TABS: 20.0000 mg | ORAL_TABLET | Freq: Every day | ORAL | Status: DC

## 2016-01-16 NOTE — Telephone Encounter (Signed)
Follow  Up Call    Calling about previous message and it wanting to know will it hurt him if he misses today from taking his medication .Marland Kitchen.Please Call

## 2016-01-16 NOTE — Telephone Encounter (Signed)
Returned call. Wife aware I have placed samples for pickup at front desk. She is requesting an alternative medication and stated she spoke to someone last week about less costly med on pt's formulary. Aware I will defer for recommendations on new med. Advised to pick up Xarelto samples today & continue taking until further instruction.

## 2016-01-16 NOTE — Telephone Encounter (Signed)
Spoke to patient's wife who states that they have not been processing his medications through insurance at the pharmacy. I am unsure if this is actually the case but requested that she call his prescription coverage plan and ask about copay for Xarelto, Eliquis and Pradaxa to see if one of these medications might be affordable for them. We can change to one of these if copay is more affordable. If not we will likely need to pursue warfarin therapy. Instructed her to pick up samples today as pt is completely out of medication and call the office if she finds out another medication is more affordable.

## 2016-01-17 ENCOUNTER — Telehealth: Payer: Self-pay | Admitting: *Deleted

## 2016-01-17 NOTE — Telephone Encounter (Signed)
Rx(s) sent to pharmacy electronically.  

## 2016-01-17 NOTE — Telephone Encounter (Signed)
called to state that [employer] suggested Coumadin instead of Xarelto, needs to know if this is ok, please call and speak with pt's wife thanks

## 2016-01-19 NOTE — Telephone Encounter (Signed)
Returned call to patient's wife.She stated insurance will no longer cover Xarelto.Advised I will have scheduler call and schedule husband appointment with coumadin clinic to start on coumadin.

## 2016-01-24 ENCOUNTER — Emergency Department (HOSPITAL_BASED_OUTPATIENT_CLINIC_OR_DEPARTMENT_OTHER): Payer: Medicare Other

## 2016-01-24 ENCOUNTER — Encounter (HOSPITAL_BASED_OUTPATIENT_CLINIC_OR_DEPARTMENT_OTHER): Payer: Self-pay | Admitting: *Deleted

## 2016-01-24 ENCOUNTER — Emergency Department (HOSPITAL_BASED_OUTPATIENT_CLINIC_OR_DEPARTMENT_OTHER)
Admission: EM | Admit: 2016-01-24 | Discharge: 2016-01-24 | Disposition: A | Discharge status: Home / Self Care | Payer: Medicare Other | Attending: Emergency Medicine | Admitting: Emergency Medicine

## 2016-01-24 DIAGNOSIS — E785 Hyperlipidemia, unspecified: Secondary | ICD-10-CM | POA: Insufficient documentation

## 2016-01-24 DIAGNOSIS — I2510 Atherosclerotic heart disease of native coronary artery without angina pectoris: Secondary | ICD-10-CM | POA: Diagnosis not present

## 2016-01-24 DIAGNOSIS — R1030 Lower abdominal pain, unspecified: Secondary | ICD-10-CM | POA: Diagnosis not present

## 2016-01-24 DIAGNOSIS — S3993XA Unspecified injury of pelvis, initial encounter: Secondary | ICD-10-CM | POA: Diagnosis not present

## 2016-01-24 DIAGNOSIS — I4891 Unspecified atrial fibrillation: Secondary | ICD-10-CM | POA: Insufficient documentation

## 2016-01-24 DIAGNOSIS — R1031 Right lower quadrant pain: Secondary | ICD-10-CM | POA: Diagnosis not present

## 2016-01-24 DIAGNOSIS — Z87891 Personal history of nicotine dependence: Secondary | ICD-10-CM | POA: Insufficient documentation

## 2016-01-24 DIAGNOSIS — I10 Essential (primary) hypertension: Secondary | ICD-10-CM | POA: Diagnosis not present

## 2016-01-24 DIAGNOSIS — R109 Unspecified abdominal pain: Secondary | ICD-10-CM | POA: Diagnosis not present

## 2016-01-24 DIAGNOSIS — M25551 Pain in right hip: Secondary | ICD-10-CM | POA: Diagnosis not present

## 2016-01-24 DIAGNOSIS — Z79899 Other long term (current) drug therapy: Secondary | ICD-10-CM | POA: Insufficient documentation

## 2016-01-24 MED ORDER — OXYCODONE-ACETAMINOPHEN 5-325 MG PO TABS: 1.0000 | ORAL_TABLET | ORAL | Status: DC | PRN

## 2016-01-24 MED ORDER — CYCLOBENZAPRINE HCL 5 MG PO TABS: 5.0000 mg | ORAL_TABLET | Freq: Three times a day (TID) | ORAL | Status: DC | PRN

## 2016-01-24 MED ORDER — KETOROLAC TROMETHAMINE 15 MG/ML IJ SOLN
15.0000 mg | Freq: Once | INTRAMUSCULAR | Status: AC
  Administered 2016-01-24: 15 mg via INTRAMUSCULAR
  Filled 2016-01-24: qty 1

## 2016-01-24 MED ORDER — HYDROMORPHONE HCL 1 MG/ML IJ SOLN
1.0000 mg | Freq: Once | INTRAMUSCULAR | Status: AC
Start: 2016-01-24 — End: 2016-01-24
  Administered 2016-01-24: 1 mg via INTRAMUSCULAR
  Filled 2016-01-24: qty 1

## 2016-01-24 MED FILL — OXYCODONE/APAP 5-325: 5-325 | 2 days supply | Qty: 10 | Fill #0

## 2016-01-24 MED FILL — CYCLOBENZAPRINE 5 MG TABLET: 5 | 4 days supply | Qty: 12 | Fill #0

## 2016-01-24 NOTE — ED Notes (Signed)
State he slipped off of from of boat onto feet hard to asphalt and now right groin hurts. No other injury.

## 2016-01-24 NOTE — ED Notes (Signed)
Patient assisted from POV to wheelchair by security and myself.  Unable to bear weight on right leg.

## 2016-01-24 NOTE — ED Provider Notes (Signed)
CSN: [id number]     Arrival date & time 01/24/16  4098 History   First MD Initiated Contact with Patient 01/24/16 1004     No chief complaint on file.    (Consider location/radiation/quality/duration/timing/severity/associated sxs/prior Treatment) HPI   75 year old male with right hip/groin pain. Yesterday he was stepping down from the bow of a boat when he felt pain in his right groin region. He was able to go about his activities though with some mild discomfort. That sniffily worsened throughout the evening and into Tuesday. It has now progressed the point has a lot of pain with bearing minimal weight or destroying to passively range the hip. Denies any other acute pain. Acute numbness or tingling. His wife gave him "a pain pill" this morning which did not help much. He reports a past history of inguinal hernia repair.  Past Medical History  Diagnosis Date  . Coronary artery disease 12/2007    STATUS POST STENTING OF THE LEFT ANTERIOR DESCENDING ARTERY  . Hypertension   . Hyperlipidemia   . SBO (small bowel obstruction) (HCC)     PARTIAL  . LBP (low back pain)   . Arthritis   . Gout   . RBBB (right bundle branch block)   . Partial small bowel obstruction (HCC)   . Hiatal hernia   . Chronic gout   . Urinary tract infection     Escherichia coli urinary tract infection  . Dyslipidemia   . Second degree AV block, Mobitz type I   . Atrial fibrillation (HCC) 01/31/2015   Past Surgical History  Procedure Laterality Date  . Hernia repair      INGUINAL  . Rotator cuff repair    . Cardiac catheterization     Family History  Problem Relation Age of Onset  . Emphysema Mother 41  . Heart attack Father 5   Social History  Substance Use Topics  . Smoking status: Former Smoker    Quit date: 08/27/1990  . Smokeless tobacco: None  . Alcohol Use: Yes     Comment: Drinks every day.    Review of Systems  All systems reviewed and negative, other than as noted in HPI.   Allergies   Review of patient's allergies indicates no known allergies.  Home Medications   Prior to Admission medications   Medication Sig Start Date End Date Taking? Authorizing Provider  allopurinol (ZYLOPRIM) 300 MG tablet Take 600 mg by mouth daily.    Yes Historical Provider, MD  ALPRAZolam (XANAX) 0.25 MG tablet Take 1 tablet (0.25 mg total) by mouth 3 (three) times daily as needed for anxiety. 04/21/15  Yes Peter M [place], MD  colchicine (COLCRYS) 0.6 MG tablet Take 0.6 mg daily if needed for gout. 10/10/15  Yes Peter M [place], MD  losartan (COZAAR) 25 MG tablet TAKE 1 TABLET EVERY DAY 10/17/15  Yes Peter M [place], MD  metoprolol succinate (TOPROL-XL) 50 MG 24 hr tablet TAKE 1 TABLET DAILY WITH OR IMMEDIATELY FOLLOWING A MEAL. 01/17/16  Yes Peter M [place], MD  rivaroxaban (XARELTO) 20 MG TABS tablet Take 1 tablet (20 mg total) by mouth daily. 01/16/16  Yes Peter M [place], MD  simvastatin (ZOCOR) 40 MG tablet TAKE 1 TABLET (40 MG TOTAL) BY MOUTH AT BEDTIME. 06/16/15  Yes Peter M [place], MD   BP 148/76 mmHg  Pulse 75  Temp(Src) 97.8 F (36.6 C) (Oral)  Resp 18  Ht  (1.803 m)  Wt 236 lb (107.049 kg)  BMI 32.93 kg/m2  SpO2 95% Physical Exam  Constitutional: He appears well-developed and well-nourished. No distress.  HENT:  Head: Normocephalic and atraumatic.  Eyes: Conjunctivae are normal. Right eye exhibits no discharge. Left eye exhibits no discharge.  Neck: Neck supple.  Cardiovascular: Normal rate, regular rhythm and normal heart sounds.  Exam reveals no gallop and no friction rub.   No murmur heard. Pulmonary/Chest: Effort normal and breath sounds normal. No respiratory distress.  Abdominal: Soft. He exhibits no distension. There is no tenderness.  Musculoskeletal:  Lower extremities grossly normal in appearance and symmetric as compared to each other. There is no shortening or malrotation. He has pain with both active and passive range of motion of right hip, particularly flexion  and abduction. Simply worse against resistance. No appreciable tenderness to palpation of the lower abdomen, hip, groin and proximal thigh. No hernia appreciated.  Neurological: He is alert.  Skin: Skin is warm and dry.  Psychiatric: He has a normal mood and affect. His behavior is normal. Thought content normal.  Nursing note and vitals reviewed.   ED Course  Procedures (including critical care time) Labs Review Labs Reviewed - No data to display  Imaging Review Ct Hip Right Wo Contrast  01/24/2016  CLINICAL DATA:  Status post fall.  Right groin pain. EXAM: CT OF THE RIGHT HIP WITHOUT CONTRAST TECHNIQUE: Multidetector CT imaging of the right hip was performed according to the standard protocol. Multiplanar CT image reconstructions were also generated. COMPARISON:  None. FINDINGS: Bones/Joint/Cartilage No aggressive lytic or sclerotic osseous lesion. No fracture or dislocation. Normal alignment. Joint space is relatively well maintained. Mild degenerative changes of the visualize right sacroiliac joint with anterior bridging osteophyte. Muscles Normal. Soft tissue No fluid collection or hematoma. No soft tissue mass. Peripheral vascular atherosclerotic disease. IMPRESSION: No acute osseous injury of the right hip. Electronically Signed   By: Elige KoHetal  Patel   On: 01/24/2016 11:51   Dg Hip Unilat With Pelvis 2-3 Views Right  01/24/2016  CLINICAL DATA:  Right groin pain EXAM: DG HIP (WITH OR WITHOUT PELVIS) 2-3V RIGHT COMPARISON:  None. FINDINGS: There is no evidence of hip fracture or dislocation. There is no evidence of arthropathy or other focal bone abnormality. IMPRESSION: Negative. Electronically Signed   By: Marlan Palauharles  Clark M.D.   On: 01/24/2016 11:06   I have personally reviewed and evaluated these images and lab results as part of my medical decision-making.   EKG Interpretation None      MDM   Final diagnoses:  Right groin pain    75yM with R groin pain. Likely strain. Delayed  onset of more severe symptoms. NVI. Imaging including CT negative. At this point plan symptomatic treatment. Needs repeat evaluation if symptoms don't start to sniffily improved by a week.  Raeford RazorStephen Darnel Mchan, MD 01/25/16 [phone]

## 2016-01-27 ENCOUNTER — Telehealth: Payer: Self-pay | Admitting: Cardiology

## 2016-01-27 DIAGNOSIS — I4819 Other persistent atrial fibrillation: Secondary | ICD-10-CM

## 2016-01-27 MED ORDER — RIVAROXABAN 20 MG PO TABS: 20.0000 mg | ORAL_TABLET | Freq: Every day | ORAL | Status: DC

## 2016-01-27 NOTE — Telephone Encounter (Signed)
New message: ° ° ° ° ° °Patient calling the office for samples of medication: ° ° °1.  What medication and dosage are you requesting samples for? Xarelto ° °2.  Are you currently out of this medication? Yes ° ° °

## 2016-01-27 NOTE — Telephone Encounter (Signed)
Samples available for pick up  verbalized uderstanding.

## 2016-01-27 NOTE — Telephone Encounter (Signed)
Called the patients home and left a voicemail to call back and schedule his new coumadin appt.

## 2016-02-21 ENCOUNTER — Telehealth: Payer: Self-pay | Admitting: Cardiology

## 2016-02-21 DIAGNOSIS — I4819 Other persistent atrial fibrillation: Secondary | ICD-10-CM

## 2016-02-21 MED ORDER — RIVAROXABAN 20 MG PO TABS: 20.0000 mg | ORAL_TABLET | Freq: Every day | ORAL | Status: DC

## 2016-02-21 NOTE — Telephone Encounter (Signed)
New message      Patient calling the office for samples of medication:   1.  What medication and dosage are you requesting samples for? Xarelto 20 mg po daily  2.  Are you currently out of this medication? About 5 more

## 2016-02-21 NOTE — Telephone Encounter (Signed)
Returned call. Samples provided for patient at front desk, caller aware. 

## 2016-02-22 ENCOUNTER — Ambulatory Visit (INDEPENDENT_AMBULATORY_CARE_PROVIDER_SITE_OTHER): Payer: Medicare Other | Admitting: Pharmacist

## 2016-02-22 DIAGNOSIS — I480 Paroxysmal atrial fibrillation: Secondary | ICD-10-CM

## 2016-02-22 DIAGNOSIS — I482 Chronic atrial fibrillation, unspecified: Secondary | ICD-10-CM

## 2016-02-22 DIAGNOSIS — Z7901 Long term (current) use of anticoagulants: Secondary | ICD-10-CM | POA: Diagnosis not present

## 2016-02-22 MED ORDER — WARFARIN SODIUM 5 MG PO TABS: ORAL_TABLET | ORAL | Status: DC

## 2016-03-08 ENCOUNTER — Other Ambulatory Visit: Payer: Self-pay | Admitting: Cardiology

## 2016-03-19 ENCOUNTER — Ambulatory Visit (INDEPENDENT_AMBULATORY_CARE_PROVIDER_SITE_OTHER): Payer: Medicare Other | Admitting: Pharmacist

## 2016-03-19 DIAGNOSIS — I482 Chronic atrial fibrillation, unspecified: Secondary | ICD-10-CM

## 2016-03-19 DIAGNOSIS — Z7901 Long term (current) use of anticoagulants: Secondary | ICD-10-CM

## 2016-03-19 DIAGNOSIS — I480 Paroxysmal atrial fibrillation: Secondary | ICD-10-CM | POA: Diagnosis not present

## 2016-03-19 LAB — POCT INR: INR: 3.5

## 2016-03-26 ENCOUNTER — Ambulatory Visit (INDEPENDENT_AMBULATORY_CARE_PROVIDER_SITE_OTHER): Payer: Medicare Other | Admitting: Pharmacist

## 2016-03-26 DIAGNOSIS — Z7901 Long term (current) use of anticoagulants: Secondary | ICD-10-CM | POA: Diagnosis not present

## 2016-03-26 DIAGNOSIS — I480 Paroxysmal atrial fibrillation: Secondary | ICD-10-CM

## 2016-03-26 DIAGNOSIS — I482 Chronic atrial fibrillation, unspecified: Secondary | ICD-10-CM

## 2016-03-26 LAB — POCT INR: INR: 5.2

## 2016-03-29 DIAGNOSIS — M1A9XX1 Chronic gout, unspecified, with tophus (tophi): Secondary | ICD-10-CM | POA: Diagnosis not present

## 2016-03-29 DIAGNOSIS — M25569 Pain in unspecified knee: Secondary | ICD-10-CM | POA: Diagnosis not present

## 2016-03-29 DIAGNOSIS — M1990 Unspecified osteoarthritis, unspecified site: Secondary | ICD-10-CM | POA: Diagnosis not present

## 2016-04-02 ENCOUNTER — Encounter: Payer: Self-pay | Admitting: *Deleted

## 2016-04-03 ENCOUNTER — Ambulatory Visit (INDEPENDENT_AMBULATORY_CARE_PROVIDER_SITE_OTHER): Payer: Medicare Other | Admitting: Pharmacist

## 2016-04-03 ENCOUNTER — Encounter (INDEPENDENT_AMBULATORY_CARE_PROVIDER_SITE_OTHER): Payer: Self-pay

## 2016-04-03 DIAGNOSIS — I480 Paroxysmal atrial fibrillation: Secondary | ICD-10-CM

## 2016-04-03 DIAGNOSIS — I482 Chronic atrial fibrillation, unspecified: Secondary | ICD-10-CM

## 2016-04-03 DIAGNOSIS — Z7901 Long term (current) use of anticoagulants: Secondary | ICD-10-CM

## 2016-04-03 LAB — POCT INR: INR: 2.3

## 2016-04-09 ENCOUNTER — Other Ambulatory Visit: Payer: Self-pay | Admitting: Cardiology

## 2016-04-09 NOTE — Telephone Encounter (Signed)
Rx(s) sent to pharmacy electronically.  

## 2016-04-16 ENCOUNTER — Ambulatory Visit (INDEPENDENT_AMBULATORY_CARE_PROVIDER_SITE_OTHER): Payer: Medicare Other | Admitting: Pharmacist

## 2016-04-16 ENCOUNTER — Encounter: Payer: Self-pay | Admitting: Cardiology

## 2016-04-16 ENCOUNTER — Ambulatory Visit (INDEPENDENT_AMBULATORY_CARE_PROVIDER_SITE_OTHER): Payer: Medicare Other | Admitting: Cardiology

## 2016-04-16 VITALS — BP 118/76 | HR 79 | Ht 70.0 in | Wt 237.4 lb

## 2016-04-16 DIAGNOSIS — E785 Hyperlipidemia, unspecified: Secondary | ICD-10-CM

## 2016-04-16 DIAGNOSIS — I4510 Unspecified right bundle-branch block: Secondary | ICD-10-CM | POA: Diagnosis not present

## 2016-04-16 DIAGNOSIS — Z7901 Long term (current) use of anticoagulants: Secondary | ICD-10-CM

## 2016-04-16 DIAGNOSIS — I10 Essential (primary) hypertension: Secondary | ICD-10-CM

## 2016-04-16 DIAGNOSIS — I480 Paroxysmal atrial fibrillation: Secondary | ICD-10-CM

## 2016-04-16 DIAGNOSIS — I2510 Atherosclerotic heart disease of native coronary artery without angina pectoris: Secondary | ICD-10-CM | POA: Diagnosis not present

## 2016-04-16 DIAGNOSIS — I482 Chronic atrial fibrillation, unspecified: Secondary | ICD-10-CM

## 2016-04-16 DIAGNOSIS — I441 Atrioventricular block, second degree: Secondary | ICD-10-CM

## 2016-04-16 LAB — CBC WITH DIFFERENTIAL/PLATELET
BASOS ABS: 105 {cells}/uL (ref 0–200)
BASOS PCT: 1 %
EOS ABS: 315 {cells}/uL (ref 15–500)
Eosinophils Relative: 3 %
HEMATOCRIT: 44.1 % (ref 38.5–50.0)
HEMOGLOBIN: 14.3 g/dL (ref 13.2–17.1)
Lymphocytes Relative: 17 %
Lymphs Abs: 1785 cells/uL (ref 850–3900)
MCH: 30.4 pg (ref 27.0–33.0)
MCHC: 32.4 g/dL (ref 32.0–36.0)
MCV: 93.8 fL (ref 80.0–100.0)
MONO ABS: 1155 {cells}/uL — AB (ref 200–950)
MONOS PCT: 11 %
MPV: 9.5 fL (ref 7.5–12.5)
NEUTROS ABS: 7140 {cells}/uL (ref 1500–7800)
Neutrophils Relative %: 68 %
PLATELETS: 205 10*3/uL (ref 140–400)
RBC: 4.7 MIL/uL (ref 4.20–5.80)
RDW: 15.7 % — ABNORMAL HIGH (ref 11.0–15.0)
WBC: 10.5 10*3/uL (ref 3.8–10.8)

## 2016-04-16 LAB — HEPATIC FUNCTION PANEL
ALK PHOS: 73 U/L (ref 40–115)
ALT: 15 U/L (ref 9–46)
AST: 19 U/L (ref 10–35)
Albumin: 4.2 g/dL (ref 3.6–5.1)
BILIRUBIN DIRECT: 0.1 mg/dL (ref <=0.2)
BILIRUBIN INDIRECT: 0.5 mg/dL (ref 0.2–1.2)
TOTAL PROTEIN: 7.1 g/dL (ref 6.1–8.1)
Total Bilirubin: 0.6 mg/dL (ref 0.2–1.2)

## 2016-04-16 LAB — LIPID PANEL
Cholesterol: 124 mg/dL — ABNORMAL LOW (ref 125–200)
HDL: 26 mg/dL — AB (ref >=40)
LDL CALC: 54 mg/dL (ref <130)
Total CHOL/HDL Ratio: 4.8 Ratio (ref <=5.0)
Triglycerides: 218 mg/dL — ABNORMAL HIGH (ref <150)
VLDL: 44 mg/dL — ABNORMAL HIGH (ref <30)

## 2016-04-16 LAB — BASIC METABOLIC PANEL
BUN: 10 mg/dL (ref 7–25)
CALCIUM: 10.1 mg/dL (ref 8.6–10.3)
CHLORIDE: 101 mmol/L (ref 98–110)
CO2: 26 mmol/L (ref 20–31)
CREATININE: 1.2 mg/dL — AB (ref 0.70–1.18)
Glucose, Bld: 93 mg/dL (ref 65–99)
Potassium: 4.8 mmol/L (ref 3.5–5.3)
Sodium: 139 mmol/L (ref 135–146)

## 2016-04-16 LAB — POCT INR: INR: 4

## 2016-04-16 LAB — URIC ACID: Uric Acid, Serum: 3.6 mg/dL — ABNORMAL LOW (ref 4.0–8.0)

## 2016-04-16 NOTE — Progress Notes (Signed)
Gregory Wheeler Date of Birth: 09-18-1940   History of Present Illness: Mr. Burnett ShengHedrick is seen for follow up of CAD and atrial fibrillation.  He has a history of coronary disease and is status post stenting of the LAD with overlapping Taxus stents in 2009. His last myoview in April 2015 showed mild apical thinning, normal EF 67%.  He  was seen in the ED in February 2016 for symptoms of left arm numbness and pain. This was worse with lifting his arm. Ecg was done and demonstrated tachycardia with no clear P waves and rate 113. He was treated with a muscle relaxant.  He was seen in June 2016 symptoms of fatigue and acute gout flair in his right knee bursa. He was noted to be in atrial fibrillation with RVR (and review of ECG from February showed the same). He had been drinking Etoh heavily. He was started on metoprolol and Xarelto.  He had surgical excision and drainage of his right Knee by Dr. Rayburn MaBlackmon. He was scheduled for a DCCV on 03/03/15 but on arrival he was in NSR with rate 77. He was seen in April 2017 with symptoms of fatigue related to low BP. This was in the setting of taking some of his wife's lasix.  He has since been switched to Coumadin due to the cost of Xarelto. No bleeding problems. Complains of pain in his feet and legs particularly at night. States his feet are freezing. He is now seeing Consuela Mimesausef Syed MD with Rheumatology.   On follow up today he denies any SOB or chest pain. No dizziness or palpitations. Gout has been controlled. He reports he has cut back on his drinking.     Current Outpatient Prescriptions on File Prior to Visit  Medication Sig Dispense Refill  . allopurinol (ZYLOPRIM) 300 MG tablet Take 600 mg by mouth daily.     Marland Kitchen. ALPRAZolam (XANAX) 0.25 MG tablet Take 1 tablet (0.25 mg total) by mouth 3 (three) times daily as needed for anxiety. 30 tablet 2  . colchicine (COLCRYS) 0.6 MG tablet Take 0.6 mg daily if needed for gout. 30 tablet 6  . cyclobenzaprine (FLEXERIL) 5 MG  tablet Take 1 tablet (5 mg total) by mouth 3 (three) times daily as needed for muscle spasms. 12 tablet 0  . losartan (COZAAR) 25 MG tablet TAKE 1 TABLET EVERY DAY 90 tablet 1  . metoprolol succinate (TOPROL-XL) 50 MG 24 hr tablet TAKE 1 TABLET DAILY WITH OR IMMEDIATELY FOLLOWING A MEAL. 90 tablet 2  . oxyCODONE-acetaminophen (PERCOCET/ROXICET) 5-325 MG tablet Take 1 tablet by mouth every 4 (four) hours as needed for severe pain. 10 tablet 0  . simvastatin (ZOCOR) 40 MG tablet TAKE 1 TABLET AT BEDTIME 90 tablet 3  . warfarin (COUMADIN) 5 MG tablet Take 1 tablet daily or as directed by the Coumadin clinic 30 tablet 0   No current facility-administered medications on file prior to visit.     No Known Allergies  Past Medical History:  Diagnosis Date  . Arthritis   . Atrial fibrillation (HCC) 01/31/2015  . Chronic gout   . Coronary artery disease 12/2007   STATUS POST STENTING OF THE LEFT ANTERIOR DESCENDING ARTERY  . Dyslipidemia   . Gout   . Hiatal hernia   . Hyperlipidemia   . Hypertension   . LBP (low back pain)   . Partial small bowel obstruction (HCC)   . RBBB (right bundle branch block)   . SBO (small bowel obstruction) (HCC)  PARTIAL  . Second degree AV block, Mobitz type I   . Urinary tract infection    Escherichia coli urinary tract infection    Past Surgical History:  Procedure Laterality Date  . CARDIAC CATHETERIZATION    . HERNIA REPAIR     INGUINAL  . ROTATOR CUFF REPAIR      History  Smoking Status  . Former Smoker  . Quit date: 08/27/1990  Smokeless Tobacco  . Never Used    History  Alcohol Use  . Yes    Comment: Drinks every day.    Family History  Problem Relation Age of Onset  . Emphysema Mother 3671  . Heart attack Father 6253    Review of Systems: As noted in HPI All other systems were reviewed and are negative.  Physical Exam: BP 118/76   Pulse 79   Ht 5\' 10"  (1.778 m)   Wt 237 lb 6.4 oz (107.7 kg)   BMI 34.06 kg/m  He is an obese  white male in no acute distress. He has no JVD or bruits. HEENT exam is unremarkable. PERRLA, sclera are clear. Oropharynx is clear. Lungs are clear. Cardiac exam reveals an irregular rate and rhythm without gallop or murmur. Abdomen is obese, soft, nontender. He has no edema.  Pedal pulses are 2+ and symmetric.   He is alert and oriented x3. Cranial nerves II through XII are intact.  LABORATORY DATA: Lab Results  Component Value Date   WBC 9.1 03/01/2015   HGB 13.3 03/01/2015   HCT 41.0 03/01/2015   PLT 245.0 03/01/2015   GLUCOSE 87 12/21/2015   CHOL 128 08/24/2014   TRIG 70 08/24/2014   HDL 57 08/24/2014   LDLCALC 57 08/24/2014   ALT 26 02/04/2015   AST 24 02/04/2015   NA 141 12/21/2015   K 4.5 12/21/2015   CL 102 12/21/2015   CREATININE 1.37 (H) 12/21/2015   BUN 21 12/21/2015   CO2 26 12/21/2015   TSH 1.927 01/31/2015   INR 4.0 04/16/2016   Echo: 02/09/15  Study Conclusions  - Left ventricle: The cavity size was normal. There was mild focal basal hypertrophy of the septum. Systolic function was normal. The estimated ejection fraction was in the range of 60% to 65%. Wall motion was normal; there were no regional wall motion abnormalities. - Aortic valve: Trileaflet; normal thickness, mildly calcified leaflets. - Mitral valve: Calcified annulus. - Pulmonic valve: There was trivial regurgitation.   Assessment / Plan: 1. Coronary disease status post stenting of the LAD in 2009 with Taxus stents. Nuclear stress test in April 2015 showed a small apical infarct with normal ejection fraction. He is asymptomatic.  Continue with risk factor modification. Antiplatelet therapy stopped now that he is on Coumadin.  2. Atrial fibrillation- paroxysmal. Pulse regular today.  He is asymptomatic so I would recommend continuing therapy with rate control and anticoagulation.  [place] vasc score is 3. . Recommend continued  abstinence from alcohol. Avoid NSAIDs. Now on coumadin. INR 4  today. Dose adjusted by pharmacy.  3. Hyperlipidemia  on simvastatin. Check fasting labs today.  4. History of Mobitz type 1 second degree AV block.   5. HTN controlled  6. Gout. Controlled. Will check chemistries and uric acid level and forward to Rheumatology.  7. Etoh abuse. Recommend total abstinence.   8. Leg pain ? Neuropathy. To discuss with primary care and Rheumatology.   check lab work including CBC, chemistries, lipids, and uric acid. Follow up in 6 months.

## 2016-04-16 NOTE — Patient Instructions (Signed)
Continue your current therapy  We will check lab work today  I will see you in 6 months   

## 2016-04-23 ENCOUNTER — Telehealth: Payer: Self-pay | Admitting: Cardiology

## 2016-04-23 NOTE — Telephone Encounter (Signed)
Spoke with wife, pt reports blood in his stool last week, held warfarin x 2 days, now on 2.5 mg daily.   Per wife, he wants to go back on Xarelto, doesn't like feeling cold all the time from warfarin.  Still has 8-10 tablets at home.    Last INR 1 week ago was 4, advised wife that we need to check INR to safely switch medications, he will come tomorrow am before work.  CrCl 81 CrCl with IBW 55.7  Will have him resume Xarelto 20 mg once daily with meal.

## 2016-04-23 NOTE — Telephone Encounter (Signed)
Returned call to patient's wife.She stated husband had bright red rectal bleeding last Wed night 8/23 and also on 8/24.Stated he held coumadin for 2 days.Stated he thinks he needs to decrease dose.Advised I will send message to coumadin clinic.

## 2016-04-23 NOTE — Telephone Encounter (Signed)
New message  Pt wife call requesting to speak with RN about pt see blood in stool. please call back to discuss

## 2016-04-24 ENCOUNTER — Ambulatory Visit (INDEPENDENT_AMBULATORY_CARE_PROVIDER_SITE_OTHER): Payer: Medicare Other | Admitting: Pharmacist

## 2016-04-24 DIAGNOSIS — I482 Chronic atrial fibrillation, unspecified: Secondary | ICD-10-CM

## 2016-04-24 DIAGNOSIS — Z7901 Long term (current) use of anticoagulants: Secondary | ICD-10-CM

## 2016-04-24 DIAGNOSIS — I480 Paroxysmal atrial fibrillation: Secondary | ICD-10-CM | POA: Diagnosis not present

## 2016-04-24 LAB — POCT INR: INR: 1.4

## 2016-04-24 MED ORDER — RIVAROXABAN 20 MG PO TABS: 20.0000 mg | ORAL_TABLET | Freq: Every day | ORAL | 0 refills | Status: DC

## 2016-04-24 MED ORDER — RIVAROXABAN 20 MG PO TABS: 20.0000 mg | ORAL_TABLET | Freq: Every day | ORAL | 1 refills | Status: DC

## 2016-04-26 ENCOUNTER — Telehealth: Payer: Self-pay | Admitting: Cardiology

## 2016-04-26 ENCOUNTER — Encounter: Payer: Self-pay | Admitting: Cardiology

## 2016-04-26 NOTE — Telephone Encounter (Signed)
Pt's wife calling per pt request for lab results done 04-24-16, all I see is Coumadin-pls call with results

## 2016-04-26 NOTE — Telephone Encounter (Signed)
Most likely inquiring about all the results from 8/21 lab work

## 2016-04-26 NOTE — Telephone Encounter (Signed)
Returned call to patient's wife no answer.LMTC. 

## 2016-04-26 NOTE — Telephone Encounter (Signed)
Spoke to spouse and gave results - OK per DPR. Pt also wanting something for "pains in legs" but I recommended to contact PCP or rheumatology. advised uric acid was low/normal, other labwork OK, recommended to continue current medical therapy. She voiced thanks and understanding of results and recommendations.

## 2016-04-26 NOTE — Telephone Encounter (Signed)
New Message ° °Pt returning RN call. Please call back to discuss  °

## 2016-05-09 IMAGING — CR DG KNEE COMPLETE 4+V*R*
4 series · 4 of 4 positions shown · non-contrast
Comparison: None.

CLINICAL DATA: RIGHT knee pain and swelling. Redness for 1 week. No
injury. Fluid aspirated 02/01/2015.

EXAM:
RIGHT KNEE - COMPLETE 4+ VIEW

[t knee ap right]
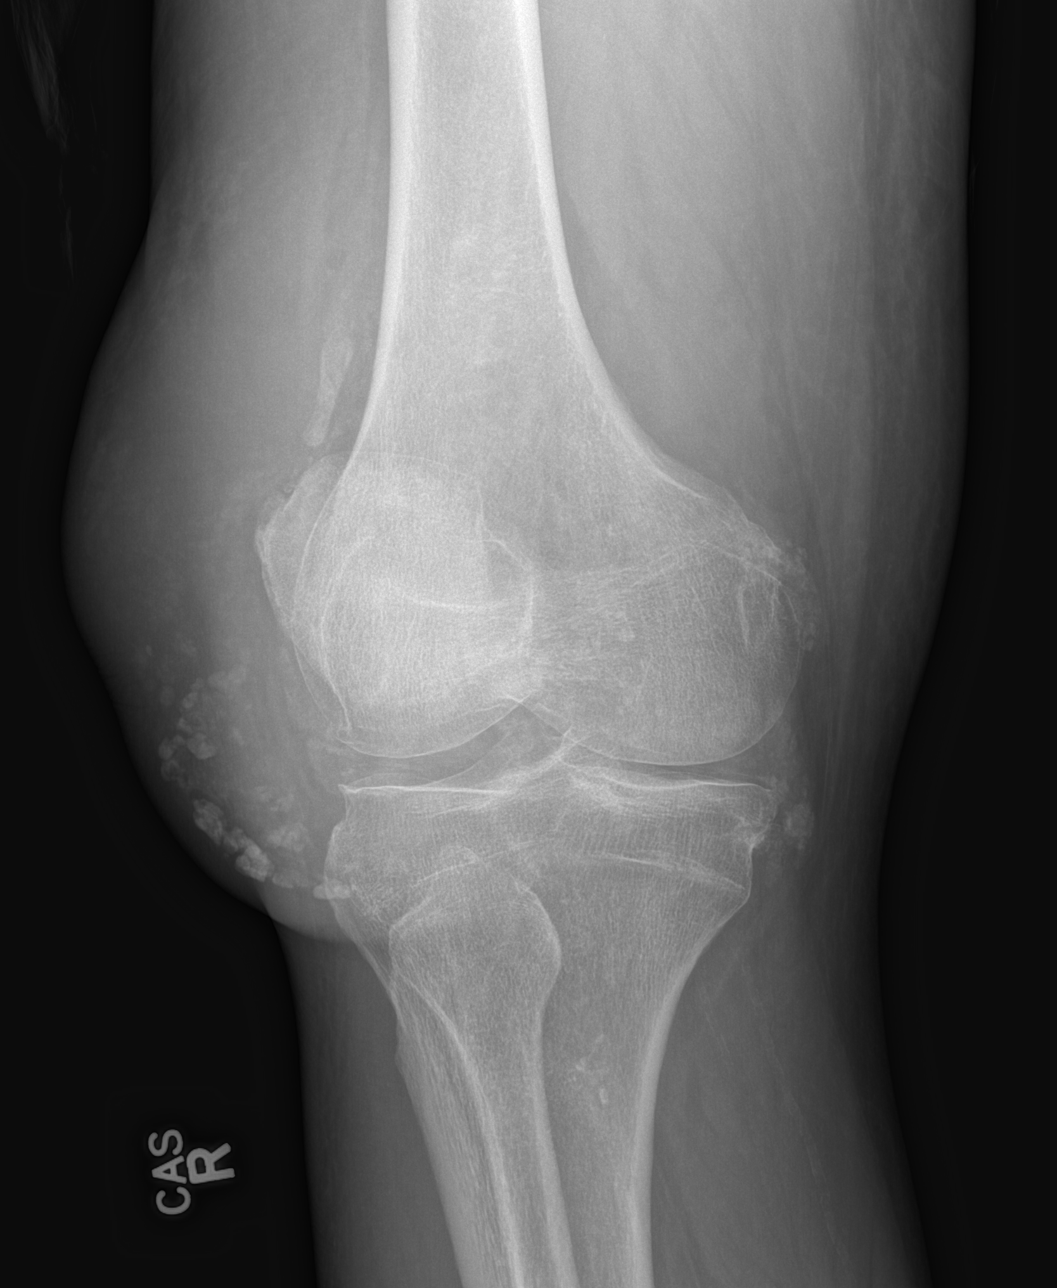

[t knee obl right (1 of 2)]
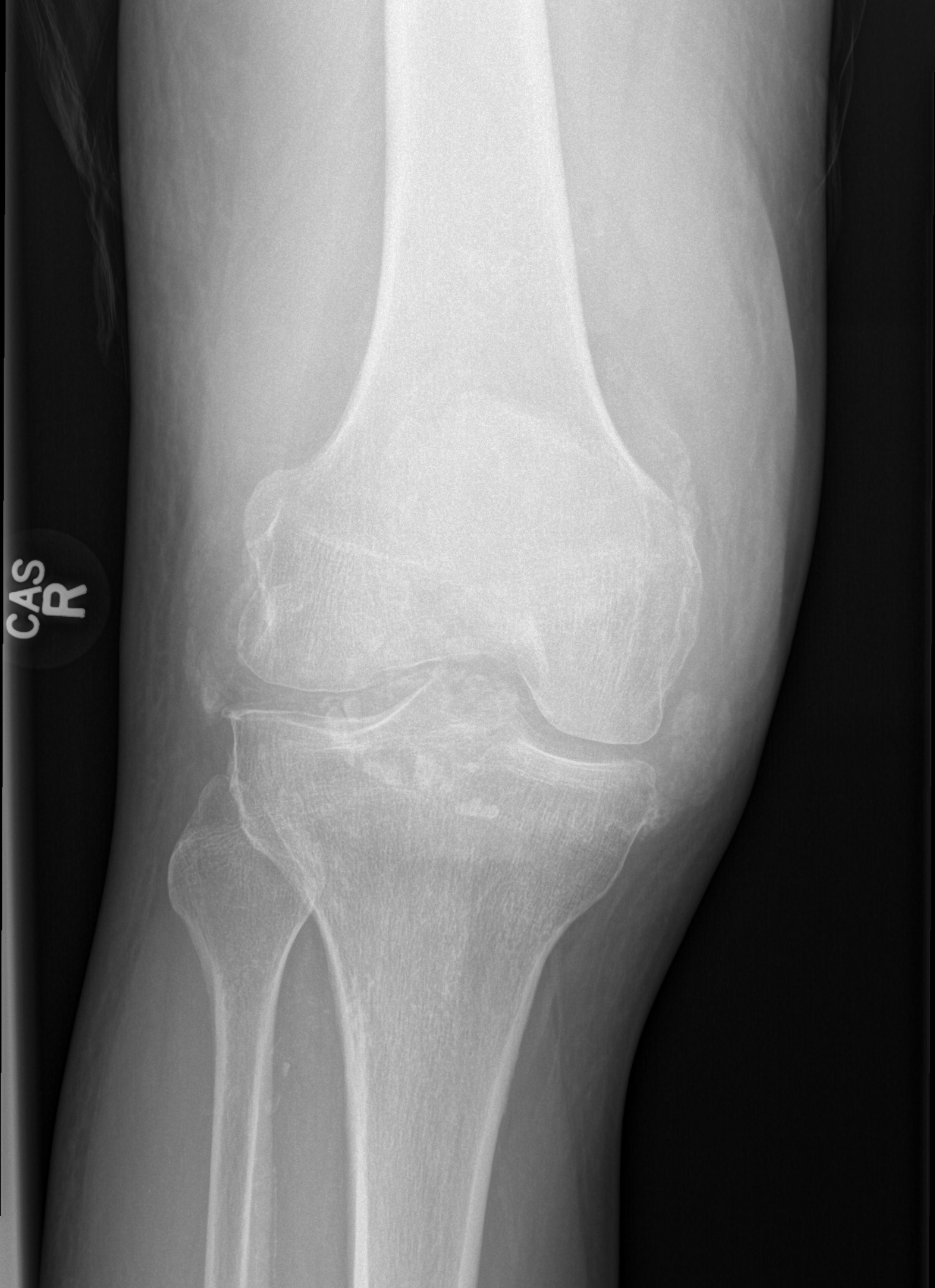

[t knee obl right (2 of 2)]
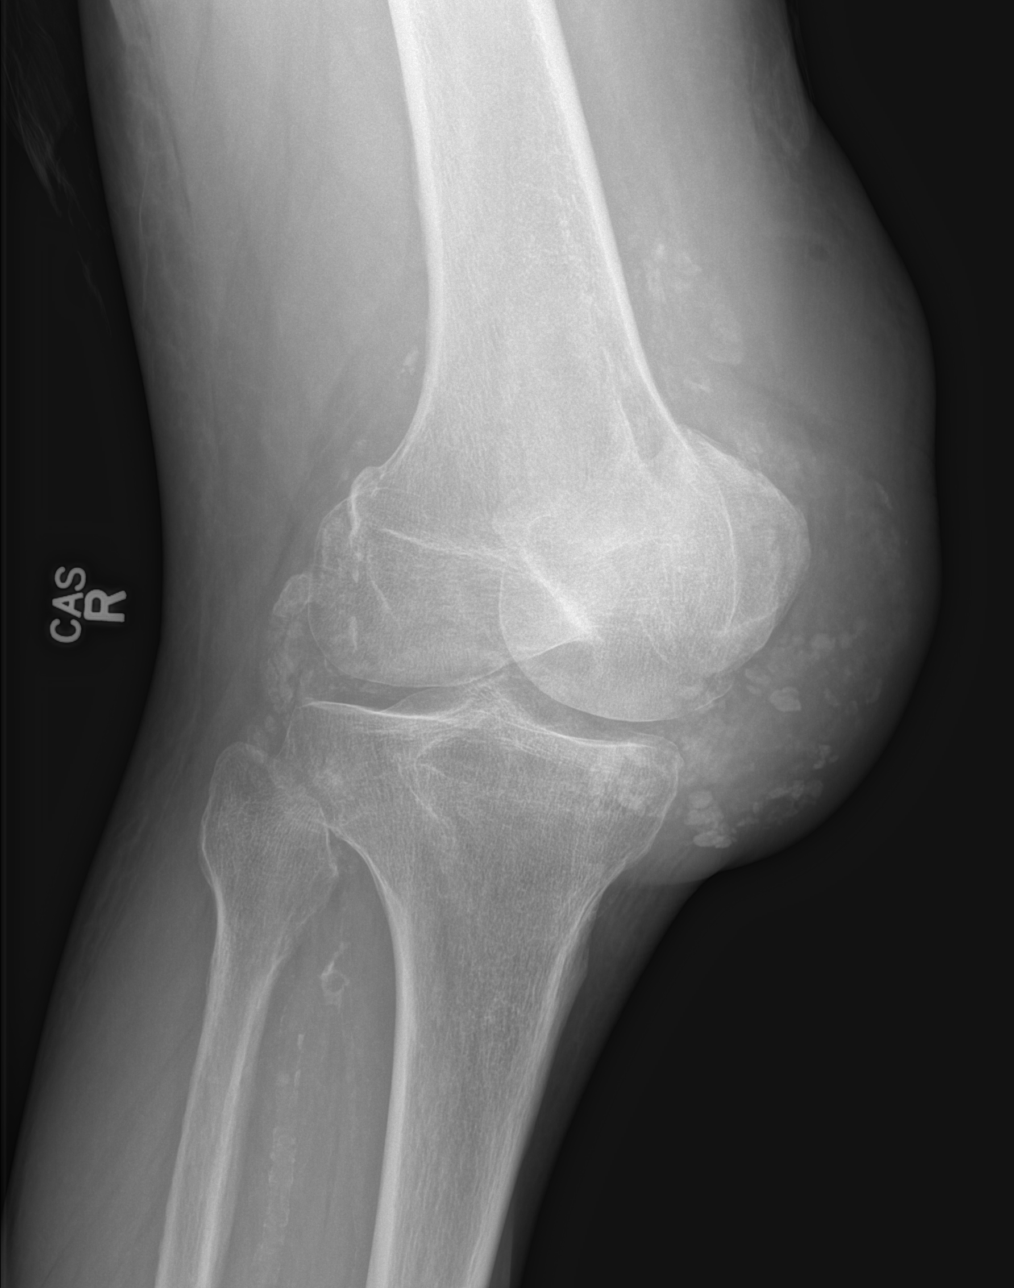

[t knee lat right]
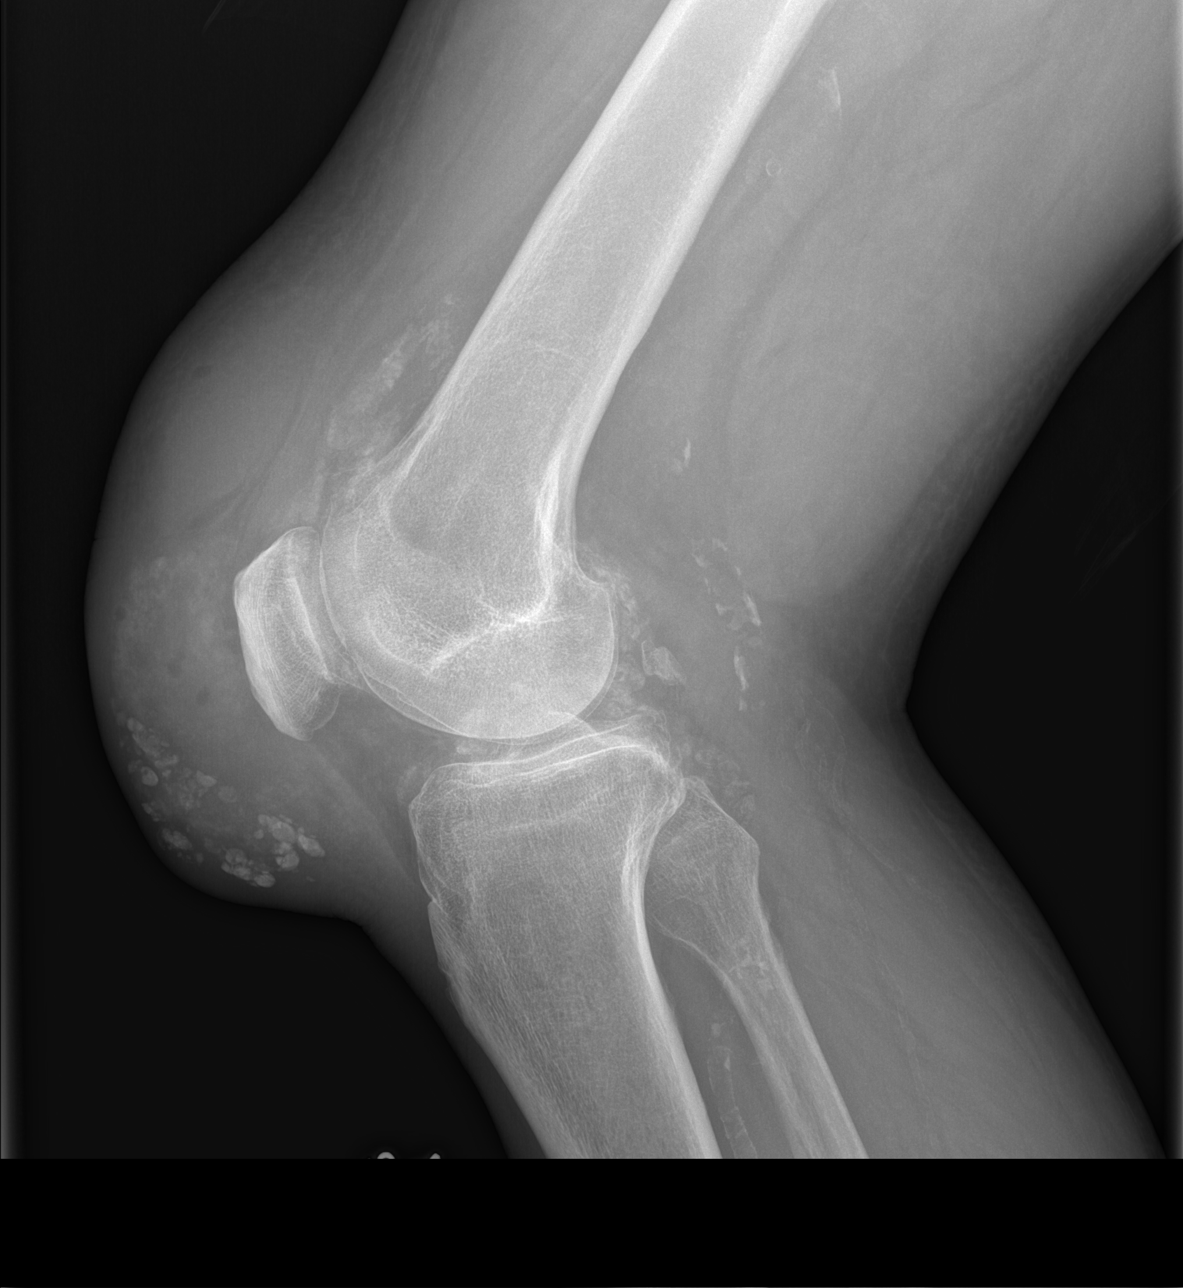

[4 of 4 positions shown; findings below may reference images not displayed]

FINDINGS: Severe prepatellar soft tissue swelling is present with
calcifications, likely representing calcified gouty tophus.
Chondrocalcinosis of the menisci. Medial and lateral joint spaces
are relatively preserved although there is mild medial compartment
osteoarthritis. Calcifications are present throughout the knee
joint, also likely from gout and/or CPPD. Atherosclerosis. No
fracture.
IMPRESSION: No acute osseous abnormality. Chronic changes compatible with gout,
most pronounced in the prepatellar soft tissues and in the knee
joint.

## 2016-05-14 ENCOUNTER — Emergency Department (HOSPITAL_COMMUNITY): Payer: Medicare Other

## 2016-05-14 ENCOUNTER — Encounter (HOSPITAL_COMMUNITY): Payer: Self-pay | Admitting: Emergency Medicine

## 2016-05-14 ENCOUNTER — Emergency Department (HOSPITAL_COMMUNITY)
Admission: EM | Admit: 2016-05-14 | Discharge: 2016-05-14 | Disposition: A | Discharge status: Home / Self Care | Payer: Medicare Other | Attending: Emergency Medicine | Admitting: Emergency Medicine

## 2016-05-14 DIAGNOSIS — Y939 Activity, unspecified: Secondary | ICD-10-CM | POA: Insufficient documentation

## 2016-05-14 DIAGNOSIS — R930 Abnormal findings on diagnostic imaging of skull and head, not elsewhere classified: Secondary | ICD-10-CM | POA: Diagnosis not present

## 2016-05-14 DIAGNOSIS — Z7901 Long term (current) use of anticoagulants: Secondary | ICD-10-CM | POA: Diagnosis not present

## 2016-05-14 DIAGNOSIS — S4992XA Unspecified injury of left shoulder and upper arm, initial encounter: Secondary | ICD-10-CM | POA: Diagnosis present

## 2016-05-14 DIAGNOSIS — I2510 Atherosclerotic heart disease of native coronary artery without angina pectoris: Secondary | ICD-10-CM | POA: Diagnosis not present

## 2016-05-14 DIAGNOSIS — Y999 Unspecified external cause status: Secondary | ICD-10-CM | POA: Diagnosis not present

## 2016-05-14 DIAGNOSIS — S8992XA Unspecified injury of left lower leg, initial encounter: Secondary | ICD-10-CM | POA: Diagnosis not present

## 2016-05-14 DIAGNOSIS — S42302A Unspecified fracture of shaft of humerus, left arm, initial encounter for closed fracture: Secondary | ICD-10-CM

## 2016-05-14 DIAGNOSIS — W01198A Fall on same level from slipping, tripping and stumbling with subsequent striking against other object, initial encounter: Secondary | ICD-10-CM | POA: Diagnosis not present

## 2016-05-14 DIAGNOSIS — S0990XA Unspecified injury of head, initial encounter: Secondary | ICD-10-CM | POA: Diagnosis not present

## 2016-05-14 DIAGNOSIS — S42212A Unspecified displaced fracture of surgical neck of left humerus, initial encounter for closed fracture: Secondary | ICD-10-CM | POA: Diagnosis not present

## 2016-05-14 DIAGNOSIS — Y929 Unspecified place or not applicable: Secondary | ICD-10-CM | POA: Insufficient documentation

## 2016-05-14 DIAGNOSIS — S42252A Displaced fracture of greater tuberosity of left humerus, initial encounter for closed fracture: Secondary | ICD-10-CM | POA: Diagnosis not present

## 2016-05-14 DIAGNOSIS — I10 Essential (primary) hypertension: Secondary | ICD-10-CM | POA: Diagnosis not present

## 2016-05-14 DIAGNOSIS — Z79899 Other long term (current) drug therapy: Secondary | ICD-10-CM | POA: Insufficient documentation

## 2016-05-14 DIAGNOSIS — M25562 Pain in left knee: Secondary | ICD-10-CM | POA: Diagnosis not present

## 2016-05-14 DIAGNOSIS — Z87891 Personal history of nicotine dependence: Secondary | ICD-10-CM | POA: Diagnosis not present

## 2016-05-14 LAB — D-DIMER, QUANTITATIVE (NOT AT ARMC): D DIMER QUANT: 0.38 ug{FEU}/mL (ref 0.00–0.50)

## 2016-05-14 LAB — COMPREHENSIVE METABOLIC PANEL
ALT: 14 U/L — ABNORMAL LOW (ref 17–63)
AST: 20 U/L (ref 15–41)
Albumin: 3.7 g/dL (ref 3.5–5.0)
Alkaline Phosphatase: 70 U/L (ref 38–126)
Anion gap: 7 (ref 5–15)
BILIRUBIN TOTAL: 0.6 mg/dL (ref 0.3–1.2)
BUN: 10 mg/dL (ref 6–20)
CHLORIDE: 99 mmol/L — AB (ref 101–111)
CO2: 26 mmol/L (ref 22–32)
CREATININE: 1.2 mg/dL (ref 0.61–1.24)
Calcium: 10 mg/dL (ref 8.9–10.3)
GFR, EST NON AFRICAN AMERICAN: 58 mL/min — AB (ref >60)
Glucose, Bld: 146 mg/dL — ABNORMAL HIGH (ref 65–99)
POTASSIUM: 4 mmol/L (ref 3.5–5.1)
Sodium: 132 mmol/L — ABNORMAL LOW (ref 135–145)
TOTAL PROTEIN: 6.9 g/dL (ref 6.5–8.1)

## 2016-05-14 LAB — CBC WITH DIFFERENTIAL/PLATELET
BASOS ABS: 0.1 10*3/uL (ref 0.0–0.1)
Basophils Relative: 1 %
EOS PCT: 1 %
Eosinophils Absolute: 0.1 10*3/uL (ref 0.0–0.7)
HEMATOCRIT: 41.9 % (ref 39.0–52.0)
Hemoglobin: 13.5 g/dL (ref 13.0–17.0)
LYMPHS ABS: 1.6 10*3/uL (ref 0.7–4.0)
LYMPHS PCT: 14 %
MCH: 30.5 pg (ref 26.0–34.0)
MCHC: 32.2 g/dL (ref 30.0–36.0)
MCV: 94.6 fL (ref 78.0–100.0)
MONO ABS: 1 10*3/uL (ref 0.1–1.0)
MONOS PCT: 9 %
NEUTROS ABS: 8.6 10*3/uL — AB (ref 1.7–7.7)
Neutrophils Relative %: 75 %
PLATELETS: 197 10*3/uL (ref 150–400)
RBC: 4.43 MIL/uL (ref 4.22–5.81)
RDW: 15 % (ref 11.5–15.5)
WBC: 11.2 10*3/uL — ABNORMAL HIGH (ref 4.0–10.5)

## 2016-05-14 LAB — TROPONIN I

## 2016-05-14 MED ORDER — FENTANYL CITRATE (PF) 100 MCG/2ML IJ SOLN
100.0000 ug | Freq: Once | INTRAMUSCULAR | Status: AC
  Administered 2016-05-14: 100 ug via INTRAVENOUS
  Filled 2016-05-14: qty 2

## 2016-05-14 MED ORDER — HYDROCODONE-ACETAMINOPHEN 5-325 MG PO TABS: 1.0000 | ORAL_TABLET | Freq: Once | ORAL | Status: DC

## 2016-05-14 MED ORDER — OXYCODONE-ACETAMINOPHEN 5-325 MG PO TABS
1.0000 | ORAL_TABLET | Freq: Four times a day (QID) | ORAL | 0 refills | Status: DC | PRN
Start: 2016-05-14 — End: 2016-10-22

## 2016-05-14 MED ORDER — OXYCODONE-ACETAMINOPHEN 5-325 MG PO TABS
2.0000 | ORAL_TABLET | Freq: Once | ORAL | Status: AC
  Administered 2016-05-14: 2 via ORAL
  Filled 2016-05-14: qty 2

## 2016-05-14 NOTE — Progress Notes (Signed)
Orthopedic Tech Progress Note Patient Details:  Javier GlazierJack S Mancil May 22, 1941 [id number]  Ortho Devices Type of Ortho Device: Shoulder immobilizer Ortho Device/Splint Interventions: Application   Saul FordyceJennifer C Kaj Vasil 05/14/2016, 10:34 AM

## 2016-05-14 NOTE — ED Notes (Signed)
Patient family given water.

## 2016-05-14 NOTE — ED Triage Notes (Signed)
Pt fell last night c/o left shoulder pain-- unable to lift arm. States left knee has been "giving out on me" -- thinks that is why he fell onto shoulder.

## 2016-05-14 NOTE — ED Provider Notes (Signed)
[facility] DEPT Provider Note   CSN: [id number] Arrival date & time: 05/14/16  0802     History   Chief Complaint Chief Complaint  Patient presents with  . Shoulder Injury  . Leg Pain    HPI Gregory Wheeler is a 75 y.o. male.  75 year old male with a past medical history of gout, hypertension, hyperlipidemia, coronary artery disease, second degree Mobitz 1 block and atrial fibrillation on xarelto presents to the emergency department status post fall yesterday. Patient says he was drinking a few drinks and is unsure what happened but thinks he may have tripped over a step. In reality patient doesn't remember any of the fall and likely syncopized. He hasn't reported any chest pain back pain abdominal pain headache or blurry vision starting events. He states he did hit his left cheekbone when he fell. At this time is complaining of left shoulder pain only. No other traumatic injuries besides his shoulder and his left cheek. No vision changes.  Wife confirmed, likely syncopal episode.     Shoulder Injury   Leg Pain      Past Medical History:  Diagnosis Date  . Arthritis   . Atrial fibrillation (HCC) 01/31/2015  . Chronic gout   . Coronary artery disease 12/2007   STATUS POST STENTING OF THE LEFT ANTERIOR DESCENDING ARTERY  . Dyslipidemia   . Gout   . Hiatal hernia   . Hyperlipidemia   . Hypertension   . LBP (low back pain)   . Partial small bowel obstruction (HCC)   . RBBB (right bundle branch block)   . SBO (small bowel obstruction) (HCC)    PARTIAL  . Second degree AV block, Mobitz type I   . Urinary tract infection    Escherichia coli urinary tract infection    Patient Active Problem List   Diagnosis Date Noted  . Long term (current) use of anticoagulants [Z79.01] 02/22/2016  . Hypotension 12/21/2015  . Fatigue 12/21/2015  . Alcohol abuse 02/04/2015  . Infected prepatellar bursa 02/03/2015  . Effusion of right knee 02/03/2015  . AKI (acute kidney injury)  (HCC) 02/03/2015  . Chronic atrial fibrillation (HCC) 02/03/2015  . Knee effusion 02/03/2015  . Atrial fibrillation (HCC) 01/31/2015  . Gout 01/31/2015  . Second degree AV block, Mobitz type I 12/09/2013  . RBBB (right bundle branch block) 01/03/2011  . Hypertension   . Hyperlipidemia   . SBO (small bowel obstruction) (HCC)   . LBP (low back pain)   . Arthritis   . Gout   . Coronary artery disease 12/26/2007    Past Surgical History:  Procedure Laterality Date  . CARDIAC CATHETERIZATION    . HERNIA REPAIR     INGUINAL  . ROTATOR CUFF REPAIR         Home Medications    Prior to Admission medications   Medication Sig Start Date End Date Taking? Authorizing Provider  allopurinol (ZYLOPRIM) 300 MG tablet Take 600 mg by mouth daily.    Yes Historical Provider, MD  ALPRAZolam (XANAX) 0.25 MG tablet Take 1 tablet (0.25 mg total) by mouth 3 (three) times daily as needed for anxiety. 04/21/15  Yes Peter M [place], MD  colchicine (COLCRYS) 0.6 MG tablet Take 0.6 mg daily if needed for gout. 10/10/15  Yes Peter M [place], MD  cyclobenzaprine (FLEXERIL) 5 MG tablet Take 1 tablet (5 mg total) by mouth 3 (three) times daily as needed for muscle spasms. 01/24/16  Yes Raeford RazorStephen Kohut, MD  HYDROcodone-acetaminophen (NORCO/VICODIN) [phone]  MG tablet Take 1 tablet by mouth every 6 (six) hours as needed for pain. 05/01/16  Yes Historical Provider, MD  losartan (COZAAR) 25 MG tablet TAKE 1 TABLET EVERY DAY 03/08/16  Yes Peter M [place], MD  metoprolol succinate (TOPROL-XL) 50 MG 24 hr tablet TAKE 1 TABLET DAILY WITH OR IMMEDIATELY FOLLOWING A MEAL. 01/17/16  Yes Peter M [place], MD  rivaroxaban (XARELTO) 20 MG TABS tablet Take 1 tablet (20 mg total) by mouth daily with supper. 04/24/16  Yes Peter M [place], MD  simvastatin (ZOCOR) 40 MG tablet TAKE 1 TABLET AT BEDTIME 04/09/16  Yes Peter M [place], MD  oxyCODONE-acetaminophen (PERCOCET/ROXICET) 5-325 MG tablet Take 1 tablet by mouth every 6 (six) hours as needed for  severe pain. 05/14/16   Marily Memos, MD    Family History Family History  Problem Relation Age of Onset  . Emphysema Mother 31  . Heart attack Father 80    Social History Social History  Substance Use Topics  . Smoking status: Former Smoker    Quit date: 08/27/1990  . Smokeless tobacco: Never Used  . Alcohol use Yes     Comment: Drinks every day.     Allergies   Review of patient's allergies indicates no known allergies.   Review of Systems Review of Systems  All other systems reviewed and are negative.    Physical Exam Updated Vital Signs BP 109/87   Pulse 77   Temp 98.1 F (36.7 C) (Oral)   Resp 20   Ht 5\' 10"  (1.778 m)   Wt 230 lb (104.3 kg)   SpO2 93%   BMI 33.00 kg/m   Physical Exam  Constitutional: He appears well-developed and well-nourished.  HENT:  Head: Normocephalic and atraumatic.  Eyes: Conjunctivae are normal.  Neck: Neck supple.  Cardiovascular: Normal rate and regular rhythm.   No murmur heard. Pulmonary/Chest: Effort normal and breath sounds normal. No respiratory distress.  Abdominal: Soft. There is no tenderness.  Musculoskeletal: He exhibits tenderness (left proximal humerus). He exhibits no edema.  Neurological: He is alert.  Skin: Skin is warm and dry.  Psychiatric: He has a normal mood and affect.  Nursing note and vitals reviewed.    ED Treatments / Results  Labs (all labs ordered are listed, but only abnormal results are displayed) Labs Reviewed  CBC WITH DIFFERENTIAL/PLATELET - Abnormal; Notable for the following:       Result Value   WBC 11.2 (*)    Neutro Abs 8.6 (*)    All other components within normal limits  COMPREHENSIVE METABOLIC PANEL - Abnormal; Notable for the following:    Sodium 132 (*)    Chloride 99 (*)    Glucose, Bld 146 (*)    ALT 14 (*)    GFR calc non Af Amer 58 (*)    All other components within normal limits  D-DIMER, QUANTITATIVE (NOT AT [facility])  TROPONIN I    EKG  EKG  Interpretation  Date/Time:  Monday May 14 2016 09:30:03 EDT Ventricular Rate:  96 PR Interval:    QRS Duration: 159 QT Interval:  369 QTC Calculation: 467 R Axis:   -165 Text Interpretation:  Sinus or ectopic atrial rhythm RBBB and LPFB Inferior infarct, old Lateral leads are also involved appears similar to March 03, 2015 Confirmed by [facility] MD, Barbara Cower [phone]) on 05/14/2016 9:45:12 AM Also confirmed by [facility] MD, Tyffani Foglesong [phone]), editor Whitney Post, Cala Bradford [phone])  on 05/14/2016 10:41:56 AM       Radiology Ct  Head Wo Contrast  Result Date: 05/14/2016 CLINICAL DATA:  Fall last night. EXAM: CT HEAD WITHOUT CONTRAST TECHNIQUE: Contiguous axial images were obtained from the base of the skull through the vertex without intravenous contrast. COMPARISON:  None. FINDINGS: Brain: No acute intracranial abnormality. Specifically, no hemorrhage, hydrocephalus, mass lesion, acute infarction, or significant intracranial injury. Vascular: No hyperdense vessel or unexpected calcification. Skull: No acute calvarial abnormality Sinuses/Orbits: Visualized paranasal sinuses and mastoids clear. Orbital soft tissues unremarkable. Other: None IMPRESSION: Negative study. Electronically Signed   By: Charlett Nose M.D.   On: 05/14/2016 10:41   Dg Shoulder Left  Result Date: 05/14/2016 CLINICAL DATA:  Fall yesterday.  Pain on top of shoulder. EXAM: LEFT SHOULDER - 2+ VIEW COMPARISON:  None. FINDINGS: There is a proximal left humeral fracture involving the humeral neck and greater tuberosity. No subluxation or dislocation. Degenerative changes in the left AC joint. IMPRESSION: Left humeral neck/greater tuberosity fracture. Electronically Signed   By: Charlett Nose M.D.   On: 05/14/2016 09:19   Dg Knee Complete 4 Views Left  Result Date: 05/14/2016 CLINICAL DATA:  Fall yesterday.  Knee pain. EXAM: LEFT KNEE - COMPLETE 4+ VIEW COMPARISON:  None. FINDINGS: No acute bony abnormality. Specifically, no fracture, subluxation, or  dislocation. Soft tissues are intact. Chondrocalcinosis noted with mild degenerative changes. Vascular calcifications noted. IMPRESSION: No acute bony abnormality. Electronically Signed   By: Charlett Nose M.D.   On: 05/14/2016 09:20    Procedures Procedures (including critical care time)  Medications Ordered in ED Medications  fentaNYL (SUBLIMAZE) injection 100 mcg (100 mcg Intravenous Given 05/14/16 1002)  oxyCODONE-acetaminophen (PERCOCET/ROXICET) 5-325 MG per tablet 2 tablet (2 tablets Oral Given 05/14/16 1154)     Initial Impression / Assessment and Plan / ED Course  I have reviewed the triage vital signs and the nursing notes.  Pertinent labs & imaging results that were available during my care of the patient were reviewed by me and considered in my medical decision making (see chart for details).  Clinical Course   Apparently wife is on the way and will help with figuring out if he syncopized or not. I will work up for syncope in mean time, xr already shows proximal humerus fracture. Will get ct head as well. Significant cardiac histroy, so will get troponin and ecg.will also eval for PE. Sling and pain meds for the arm.   Workup negative. No orthostasis. Troponin and d dimer ok. Stable for dc with ortho/pcp follow up  Final Clinical Impressions(s) / ED Diagnoses   Final diagnoses:  Humerus fracture, left, closed, initial encounter    New Prescriptions Discharge Medication List as of 05/14/2016 12:41 PM       Marily Memos, MD 05/14/16 1610

## 2016-05-14 NOTE — ED Notes (Signed)
Placed ice pack across top of shoulder

## 2016-05-16 DIAGNOSIS — S42202A Unspecified fracture of upper end of left humerus, initial encounter for closed fracture: Secondary | ICD-10-CM | POA: Diagnosis not present

## 2016-05-28 DIAGNOSIS — S42295D Other nondisplaced fracture of upper end of left humerus, subsequent encounter for fracture with routine healing: Secondary | ICD-10-CM | POA: Diagnosis not present

## 2016-05-31 ENCOUNTER — Other Ambulatory Visit: Payer: Self-pay | Admitting: Cardiology

## 2016-05-31 NOTE — Telephone Encounter (Signed)
OK to refill colchicine  Ivi Griffith [place] MD, [facility]

## 2016-06-20 ENCOUNTER — Other Ambulatory Visit: Payer: Self-pay | Admitting: Cardiology

## 2016-06-20 DIAGNOSIS — I4819 Other persistent atrial fibrillation: Secondary | ICD-10-CM

## 2016-06-21 DIAGNOSIS — S42295D Other nondisplaced fracture of upper end of left humerus, subsequent encounter for fracture with routine healing: Secondary | ICD-10-CM | POA: Diagnosis not present

## 2016-07-25 ENCOUNTER — Other Ambulatory Visit: Payer: Self-pay | Admitting: Cardiology

## 2016-10-12 ENCOUNTER — Encounter: Payer: Self-pay | Admitting: Cardiology

## 2016-10-20 NOTE — Progress Notes (Signed)
Gregory Wheeler Date of Birth: [birth year]   History of Present Illness: Gregory Wheeler is seen for follow up of CAD and atrial fibrillation.  He has a history of coronary disease and is status post stenting of the LAD with overlapping Taxus stents in 2009. His last myoview in April 2015 showed mild apical thinning, normal EF 67%.  He  was seen in the ED in February 2016 for symptoms of left arm numbness and pain. This was worse with lifting his arm. Ecg was done and demonstrated tachycardia with no clear P waves and rate 113. He was treated with a muscle relaxant.  He was seen in June 2016 symptoms of fatigue and acute gout flair in his right knee bursa. He was noted to be in atrial fibrillation with RVR (and review of ECG from February showed the same). He had been drinking Etoh heavily. He was started on metoprolol and Xarelto.  He had surgical excision and drainage of his right Knee by Dr. Rayburn Ma. He was scheduled for a DCCV on 03/03/15 but on arrival he was in NSR with rate 77. He was seen in April 2017 with symptoms of fatigue related to low BP. This was in the setting of taking some of his wife's lasix.  He was  switched to Coumadin due to the cost of Xarelto. He states he hates Coumadin and went back to taking Xarelto. Complains of pain in his feet and legs particularly at night. States his feet are freezing and burn. He is now seeing Gregory Mimes MD with Rheumatology.   He was seen in the ED in September. Had been drinking and blacked out. Had a humeral fracture. Ecg was without acute change. He states he has cut back on drinking.  On follow up today he denies any SOB or chest pain. No dizziness or palpitations. Gout has been controlled. BP has been well controlled.    Current Outpatient Prescriptions on File Prior to Visit  Medication Sig Dispense Refill  . allopurinol (ZYLOPRIM) 300 MG tablet Take 600 mg by mouth daily.     Marland Kitchen HYDROcodone-acetaminophen (NORCO/VICODIN) 5-325 MG tablet Take 1  tablet by mouth every 6 (six) hours as needed for pain.    Marland Kitchen losartan (COZAAR) 25 MG tablet TAKE 1 TABLET EVERY DAY 90 tablet 1  . metoprolol succinate (TOPROL-XL) 50 MG 24 hr tablet TAKE 1 TABLET DAILY WITH OR IMMEDIATELY FOLLOWING A MEAL 90 tablet 1  . XARELTO 20 MG TABS tablet TAKE ONE TABLET BY MOUTH ONCE DAILY WITH  SUPPER. 30 tablet 6   No current facility-administered medications on file prior to visit.     No Known Allergies  Past Medical History:  Diagnosis Date  . Arthritis   . Atrial fibrillation (HCC) 01/31/2015  . Chronic gout   . Coronary artery disease 12/2007   STATUS POST STENTING OF THE LEFT ANTERIOR DESCENDING ARTERY  . Dyslipidemia   . Gout   . Hiatal hernia   . Hyperlipidemia   . Hypertension   . LBP (low back pain)   . Partial small bowel obstruction   . RBBB (right bundle branch block)   . SBO (small bowel obstruction)    PARTIAL  . Second degree AV block, Mobitz type I   . Urinary tract infection    Escherichia coli urinary tract infection    Past Surgical History:  Procedure Laterality Date  . CARDIAC CATHETERIZATION    . HERNIA REPAIR     INGUINAL  . ROTATOR  CUFF REPAIR      History  Smoking Status  . Former Smoker  . Quit date: 08/27/1990  Smokeless Tobacco  . Never Used    History  Alcohol Use  . Yes    Comment: Drinks every day.    Family History  Problem Relation Age of Onset  . Emphysema Mother 6471  . Heart attack Father 2853    Review of Systems: As noted in HPI All other systems were reviewed and are negative.  Physical Exam: BP (!) 148/77   Pulse 74   Ht 5\' 10"  (1.778 m)   Wt 231 lb (104.8 kg)   BMI 33.15 kg/m  He is an obese white male in no acute distress. He has no JVD or bruits. HEENT exam is unremarkable. PERRLA, sclera are clear. Oropharynx is clear. Lungs are clear. Cardiac exam reveals an irregular rate and rhythm without gallop or murmur. Abdomen is obese, soft, nontender. He has no edema.  Pedal pulses are 2+  and symmetric.   He is alert and oriented x3. Cranial nerves II through XII are intact.  LABORATORY DATA: Lab Results  Component Value Date   WBC 11.2 (H) 05/14/2016   HGB 13.5 05/14/2016   HCT 41.9 05/14/2016   PLT 197 05/14/2016   GLUCOSE 146 (H) 05/14/2016   CHOL 124 (L) 04/16/2016   TRIG 218 (H) 04/16/2016   HDL 26 (L) 04/16/2016   LDLCALC 54 04/16/2016   ALT 14 (L) 05/14/2016   AST 20 05/14/2016   NA 132 (L) 05/14/2016   K 4.0 05/14/2016   CL 99 (L) 05/14/2016   CREATININE 1.20 05/14/2016   BUN 10 05/14/2016   CO2 26 05/14/2016   TSH 1.927 01/31/2015   INR 1.4 04/24/2016   Echo: 02/09/15  Study Conclusions  - Left ventricle: The cavity size was normal. There was mild focal basal hypertrophy of the septum. Systolic function was normal. The estimated ejection fraction was in the range of 60% to 65%. Wall motion was normal; there were no regional wall motion abnormalities. - Aortic valve: Trileaflet; normal thickness, mildly calcified leaflets. - Mitral valve: Calcified annulus. - Pulmonic valve: There was trivial regurgitation.   Assessment / Plan: 1. Coronary disease status post stenting of the LAD in 2009 with Taxus stents. Nuclear stress test in April 2015 showed a small apical infarct with normal ejection fraction. He is asymptomatic.  Continue with risk factor modification. Antiplatelet therapy stopped now that he is on Xarelto.  2. Atrial fibrillation- paroxysmal. Pulse regular today.  He is asymptomatic so I would recommend continuing therapy with rate control and anticoagulation.  [place] vasc score is 3. . Recommend continued  abstinence from alcohol. Avoid NSAIDs. Now on Xarelto.   3. Hyperlipidemia  on simvastatin. Good control. Follow up fasting labs next visit.  4. History of Mobitz type 1 second degree AV block.   5. HTN controlled  6. Gout. Controlled.  7. Etoh abuse. Recommend total abstinence.

## 2016-10-22 ENCOUNTER — Encounter: Payer: Self-pay | Admitting: Cardiology

## 2016-10-22 ENCOUNTER — Ambulatory Visit (INDEPENDENT_AMBULATORY_CARE_PROVIDER_SITE_OTHER): Payer: Medicare Other | Admitting: Cardiology

## 2016-10-22 VITALS — BP 148/77 | HR 74 | Ht 70.0 in | Wt 231.0 lb

## 2016-10-22 DIAGNOSIS — I482 Chronic atrial fibrillation, unspecified: Secondary | ICD-10-CM

## 2016-10-22 DIAGNOSIS — I10 Essential (primary) hypertension: Secondary | ICD-10-CM

## 2016-10-22 DIAGNOSIS — E782 Mixed hyperlipidemia: Secondary | ICD-10-CM

## 2016-10-22 DIAGNOSIS — I2510 Atherosclerotic heart disease of native coronary artery without angina pectoris: Secondary | ICD-10-CM

## 2016-10-22 MED ORDER — ALPRAZOLAM 0.25 MG PO TABS: 0.2500 mg | ORAL_TABLET | Freq: Three times a day (TID) | ORAL | 2 refills | Status: DC | PRN

## 2016-10-22 NOTE — Patient Instructions (Signed)
Continue your current therapy  I will see you in 6 months with fasting lab work.   

## 2017-01-28 ENCOUNTER — Other Ambulatory Visit: Payer: Self-pay | Admitting: Cardiology

## 2017-01-28 DIAGNOSIS — I4819 Other persistent atrial fibrillation: Secondary | ICD-10-CM

## 2017-05-07 ENCOUNTER — Other Ambulatory Visit: Payer: Self-pay | Admitting: *Deleted

## 2017-05-07 MED ORDER — METOPROLOL SUCCINATE ER 50 MG PO TB24: ORAL_TABLET | ORAL | 1 refills | Status: DC

## 2017-05-07 NOTE — Telephone Encounter (Signed)
Patients wife requested a short term (7 day) rx for metoprolol until the mail order is received.

## 2017-06-05 ENCOUNTER — Other Ambulatory Visit: Payer: Self-pay | Admitting: Cardiology

## 2017-06-06 NOTE — Progress Notes (Signed)
Gregory Wheeler Date of Birth: [birth year]   History of Present Illness: Gregory Wheeler is seen for follow up of CAD and atrial fibrillation.  He has a history of coronary disease and is status post stenting of the LAD with overlapping Taxus stents in 2009. His last myoview in April 2015 showed mild apical thinning, normal EF 67%.  He  was seen in the ED in February 2016 for symptoms of left arm numbness and pain. This was worse with lifting his arm. Ecg was done and demonstrated tachycardia with no clear P waves and rate 113. He was treated with a muscle relaxant.  He was seen in June 2016 symptoms of fatigue and acute gout flair in his right knee bursa. He was noted to be in atrial fibrillation with RVR (and review of ECG from February showed the same). He had been drinking Etoh heavily. He was started on metoprolol and Xarelto.  He had surgical excision and drainage of his right Knee by Dr. Rayburn Ma. He was scheduled for a DCCV on 03/03/15 but on arrival he was in NSR with rate 77. He was seen in April 2017 with symptoms of fatigue related to low BP. This was in the setting of taking some of his wife's lasix.  He was  switched to Coumadin due to the cost of Xarelto. He states he hates Coumadin and went back to taking Xarelto.   He was seen in the ED in September 2017. Had been drinking and blacked out. Had a humeral fracture. Ecg was without acute change. He states he has cut back on drinking.  On follow up today he is doing very well. He denies any SOB or chest pain. No dizziness or palpitations. Gout has been controlled. BP has been well controlled. His wife has been very ill with pancreatic CA and PNA. He has lost weight- 12 lbs. Reports BP at home 134/84.     Current Outpatient Prescriptions on File Prior to Visit  Medication Sig Dispense Refill  . allopurinol (ZYLOPRIM) 300 MG tablet Take 600 mg by mouth daily.     Marland Kitchen ALPRAZolam (XANAX) 0.25 MG tablet Take 1 tablet (0.25 mg total) by mouth 3  (three) times daily as needed for anxiety. 30 tablet 2  . losartan (COZAAR) 25 MG tablet TAKE 1 TABLET EVERY DAY 90 tablet 0  . metoprolol succinate (TOPROL-XL) 50 MG 24 hr tablet TAKE 1 TABLET DAILY WITH OR IMMEDIATELY FOLLOWING A MEAL 90 tablet 1   No current facility-administered medications on file prior to visit.     No Known Allergies  Past Medical History:  Diagnosis Date  . Arthritis   . Atrial fibrillation (HCC) 01/31/2015  . Chronic gout   . Coronary artery disease 12/2007   STATUS POST STENTING OF THE LEFT ANTERIOR DESCENDING ARTERY  . Dyslipidemia   . Gout   . Hiatal hernia   . Hyperlipidemia   . Hypertension   . LBP (low back pain)   . Partial small bowel obstruction (HCC)   . RBBB (right bundle branch block)   . SBO (small bowel obstruction) (HCC)    PARTIAL  . Second degree AV block, Mobitz type I   . Urinary tract infection    Escherichia coli urinary tract infection    Past Surgical History:  Procedure Laterality Date  . CARDIAC CATHETERIZATION    . HERNIA REPAIR     INGUINAL  . ROTATOR CUFF REPAIR      History  Smoking Status  .  Former Smoker  . Quit date: 08/27/1990  Smokeless Tobacco  . Never Used    History  Alcohol Use  . Yes    Comment: Drinks every day.    Family History  Problem Relation Age of Onset  . Emphysema Mother 62  . Heart attack Father 63    Review of Systems: As noted in HPI All other systems were reviewed and are negative.  Physical Exam: BP (!) 153/87   Pulse 71   Ht  (1.753 m)   Wt 219 lb 12.8 oz (99.7 kg)   BMI 32.46 kg/m  GENERAL:  Well appearing WM in NAD HEENT:  PERRL, EOMI, sclera are clear. Oropharynx is clear. NECK:  No jugular venous distention, carotid upstroke brisk and symmetric, no bruits, no thyromegaly or adenopathy LUNGS:  Clear to auscultation bilaterally CHEST:  Unremarkable HEART:  RRR,  PMI not displaced or sustained,S1 and S2 within normal limits, no S3, no S4: no clicks, no rubs, no  murmurs ABD:  Soft, nontender. BS +, no masses or bruits. No hepatomegaly, no splenomegaly EXT:  2 + pulses throughout, no edema, no cyanosis no clubbing SKIN:  Warm and dry.  No rashes NEURO:  Alert and oriented x 3. Cranial nerves II through XII intact. PSYCH:  Cognitively intact    LABORATORY DATA: Lab Results  Component Value Date   WBC 11.2 (H) 05/14/2016   HGB 13.5 05/14/2016   HCT 41.9 05/14/2016   PLT 197 05/14/2016   GLUCOSE 146 (H) 05/14/2016   CHOL 124 (L) 04/16/2016   TRIG 218 (H) 04/16/2016   HDL 26 (L) 04/16/2016   LDLCALC 54 04/16/2016   ALT 14 (L) 05/14/2016   AST 20 05/14/2016   NA 132 (L) 05/14/2016   K 4.0 05/14/2016   CL 99 (L) 05/14/2016   CREATININE 1.20 05/14/2016   BUN 10 05/14/2016   CO2 26 05/14/2016   TSH 1.927 01/31/2015   INR 1.4 04/24/2016   Echo: 02/09/15  Study Conclusions  - Left ventricle: The cavity size was normal. There was mild focal basal hypertrophy of the septum. Systolic function was normal. The estimated ejection fraction was in the range of 60% to 65%. Wall motion was normal; there were no regional wall motion abnormalities. - Aortic valve: Trileaflet; normal thickness, mildly calcified leaflets. - Mitral valve: Calcified annulus. - Pulmonic valve: There was trivial regurgitation.  Ecg today shows NSR with RBBB. I have personally reviewed and interpreted this study.  Assessment / Plan: 1. Coronary disease status post stenting of the LAD in 2009 with Taxus stents. Nuclear stress test in April 2015 showed a small apical infarct with normal ejection fraction. He is asymptomatic.  Continue with risk factor modification. Antiplatelet therapy stopped now that he is on Xarelto. Continue Toprol XL.   2. Atrial fibrillation- paroxysmal. In NSR today.   He is asymptomatic so I would recommend continuing therapy with rate control and anticoagulation.  [place] vasc score is 3. . Recommend continued  abstinence from alcohol. Avoid  NSAIDs. Now on Xarelto.   3. Hyperlipidemia  on simvastatin. Follow up fasting labs today  4. History of Mobitz type 1 second degree AV block.   5. HTN controlled  6. Gout. Controlled.  7. Etoh abuse. Recommend total abstinence.   8. Obesity. Encouraged continued weight loss.

## 2017-06-10 ENCOUNTER — Encounter: Payer: Self-pay | Admitting: Cardiology

## 2017-06-10 ENCOUNTER — Ambulatory Visit (INDEPENDENT_AMBULATORY_CARE_PROVIDER_SITE_OTHER): Payer: Medicare Other | Admitting: Cardiology

## 2017-06-10 VITALS — BP 153/87 | HR 71 | Ht 69.0 in | Wt 219.8 lb

## 2017-06-10 DIAGNOSIS — E782 Mixed hyperlipidemia: Secondary | ICD-10-CM | POA: Diagnosis not present

## 2017-06-10 DIAGNOSIS — I4510 Unspecified right bundle-branch block: Secondary | ICD-10-CM

## 2017-06-10 DIAGNOSIS — I2510 Atherosclerotic heart disease of native coronary artery without angina pectoris: Secondary | ICD-10-CM

## 2017-06-10 DIAGNOSIS — I481 Persistent atrial fibrillation: Secondary | ICD-10-CM | POA: Diagnosis not present

## 2017-06-10 DIAGNOSIS — I480 Paroxysmal atrial fibrillation: Secondary | ICD-10-CM

## 2017-06-10 DIAGNOSIS — I4819 Other persistent atrial fibrillation: Secondary | ICD-10-CM

## 2017-06-10 MED ORDER — RIVAROXABAN 20 MG PO TABS: 20.0000 mg | ORAL_TABLET | Freq: Every day | ORAL | 3 refills | Status: DC

## 2017-06-10 NOTE — Patient Instructions (Signed)
Continue your current therapy  We will check blood work today   

## 2017-06-11 ENCOUNTER — Telehealth: Payer: Self-pay

## 2017-06-11 LAB — BASIC METABOLIC PANEL
BUN/Creatinine Ratio: 10 (ref 10–24)
BUN: 12 mg/dL (ref 8–27)
CHLORIDE: 98 mmol/L (ref 96–106)
CO2: 24 mmol/L (ref 20–29)
CREATININE: 1.26 mg/dL (ref 0.76–1.27)
Calcium: 9.8 mg/dL (ref 8.6–10.2)
GFR calc Af Amer: 64 mL/min/{1.73_m2} (ref >59)
GFR calc non Af Amer: 55 mL/min/{1.73_m2} — ABNORMAL LOW (ref >59)
GLUCOSE: 98 mg/dL (ref 65–99)
Potassium: 4.7 mmol/L (ref 3.5–5.2)
Sodium: 138 mmol/L (ref 134–144)

## 2017-06-11 LAB — HEPATIC FUNCTION PANEL
ALT: 23 IU/L (ref 0–44)
AST: 30 IU/L (ref 0–40)
Albumin: 4.5 g/dL (ref 3.5–4.8)
Alkaline Phosphatase: 77 IU/L (ref 39–117)
BILIRUBIN, DIRECT: 0.3 mg/dL (ref 0.00–0.40)
Bilirubin Total: 1.3 mg/dL — ABNORMAL HIGH (ref 0.0–1.2)
Total Protein: 6.7 g/dL (ref 6.0–8.5)

## 2017-06-11 LAB — CBC WITH DIFFERENTIAL/PLATELET
BASOS ABS: 0.1 10*3/uL (ref 0.0–0.2)
Basos: 1 %
EOS (ABSOLUTE): 0.2 10*3/uL (ref 0.0–0.4)
Eos: 3 %
HEMATOCRIT: 44.3 % (ref 37.5–51.0)
Hemoglobin: 15.5 g/dL (ref 13.0–17.7)
Immature Grans (Abs): 0 10*3/uL (ref 0.0–0.1)
Immature Granulocytes: 0 %
LYMPHS: 21 %
Lymphocytes Absolute: 1.6 10*3/uL (ref 0.7–3.1)
MCH: 35.1 pg — ABNORMAL HIGH (ref 26.6–33.0)
MCHC: 35 g/dL (ref 31.5–35.7)
MCV: 101 fL — ABNORMAL HIGH (ref 79–97)
MONOS ABS: 1 10*3/uL — AB (ref 0.1–0.9)
Monocytes: 13 %
NEUTROS ABS: 5 10*3/uL (ref 1.4–7.0)
NEUTROS PCT: 62 %
Platelets: 199 10*3/uL (ref 150–379)
RBC: 4.41 x10E6/uL (ref 4.14–5.80)
RDW: 13.3 % (ref 12.3–15.4)
WBC: 7.9 10*3/uL (ref 3.4–10.8)

## 2017-06-11 LAB — LIPID PANEL W/O CHOL/HDL RATIO
CHOLESTEROL TOTAL: 176 mg/dL (ref 100–199)
HDL: 66 mg/dL (ref >39)
LDL CALC: 85 mg/dL (ref 0–99)
TRIGLYCERIDES: 125 mg/dL (ref 0–149)
VLDL CHOLESTEROL CAL: 25 mg/dL (ref 5–40)

## 2017-06-11 LAB — TSH: TSH: 3.34 u[IU]/mL (ref 0.450–4.500)

## 2017-06-11 NOTE — Telephone Encounter (Signed)
Spoke to patient's wife Xarelto 20 mg samples left at [employer] office front desk.

## 2017-07-01 ENCOUNTER — Telehealth: Payer: Self-pay | Admitting: Cardiology

## 2017-07-01 NOTE — Telephone Encounter (Signed)
Patient calling the office for samples of medication:   1.  What medication and dosage are you requesting samples for? Xarelto 20 mg  2.  Are you currently out of this medication? Two pills left

## 2017-07-01 NOTE — Telephone Encounter (Signed)
Medication Samples have been provided to the patient.  Drug name: Xarelto        Strength: 20 mg          Qty: 3 bottles   LOT: [id number]   Exp.Date: 3/21  Patient has been made aware that samples are available.

## 2017-07-09 ENCOUNTER — Other Ambulatory Visit: Payer: Self-pay | Admitting: Cardiology

## 2017-07-09 NOTE — Telephone Encounter (Signed)
REFILL 

## 2017-07-23 ENCOUNTER — Telehealth: Payer: Self-pay | Admitting: Cardiology

## 2017-07-23 NOTE — Telephone Encounter (Signed)
New message     Patient calling the office for samples of medication:   1.  What medication and dosage are you requesting samples for?  xarelto 20 mg   2.  Are you currently out of this medication? 2 pills left    [phone] cell if no answer at home

## 2017-07-23 NOTE — Telephone Encounter (Signed)
Samples at the front desk 

## 2017-07-23 NOTE — Telephone Encounter (Signed)
LM that samples are ready for pick up

## 2017-08-07 ENCOUNTER — Telehealth: Payer: Self-pay | Admitting: Cardiology

## 2017-08-07 NOTE — Telephone Encounter (Signed)
New Message ° °Patient calling the office for samples of medication: ° ° °1.  What medication and dosage are you requesting samples for? Xarelto 20mg ° °2.  Are you currently out of this medication? yes ° ° ° ° °

## 2017-08-07 NOTE — Telephone Encounter (Signed)
Returned call to patient's wife.Left message on personal voice mail [facility]

## 2017-08-07 NOTE — Telephone Encounter (Signed)
OK 

## 2017-08-18 IMAGING — CT CT HEAD W/O CM
4 series · 19 of 47 positions shown, 21 images · non-contrast
Comparison: None.

CLINICAL DATA: Fall last night.

EXAM:
CT HEAD WITHOUT CONTRAST
TECHNIQUE: Contiguous axial images were obtained from the base of the skull
through the vertex without intravenous contrast.

[Series 201: head w/o, idose (1) · axial · non-contrast · 0.45mm/px · z∈[+90,+205]mm · 8 of 31 slices shown, 10 images]
[im 4/31  brain]
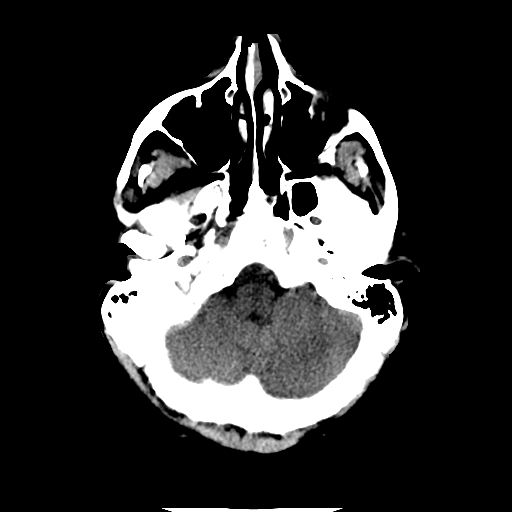
[im 4/31  bone]
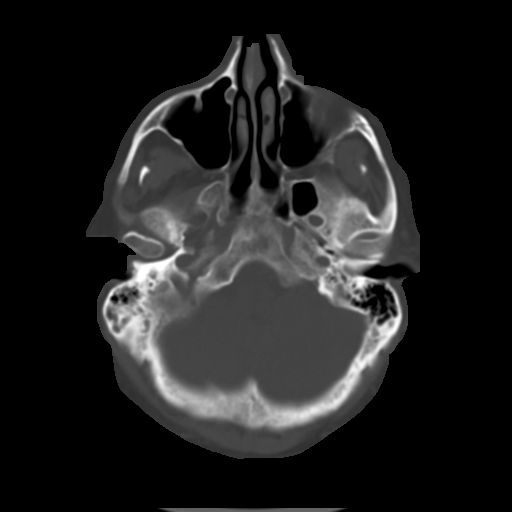
[im 7/31  brain]
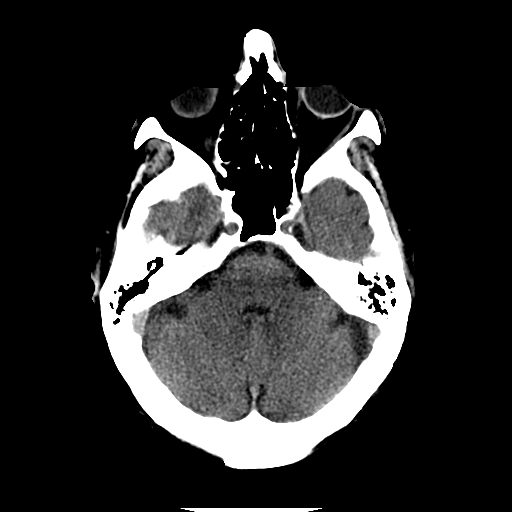
[im 11/31  brain]
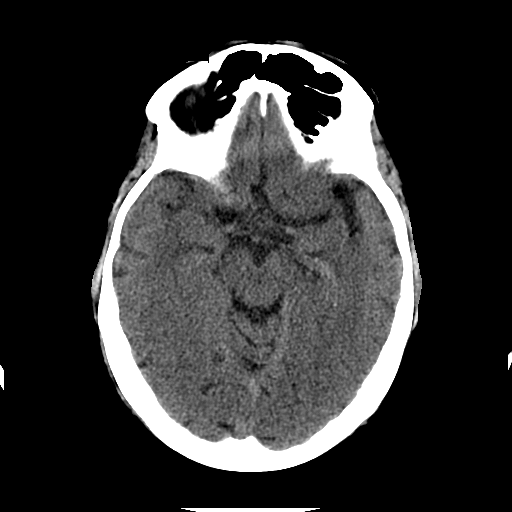
[im 14/31  brain]
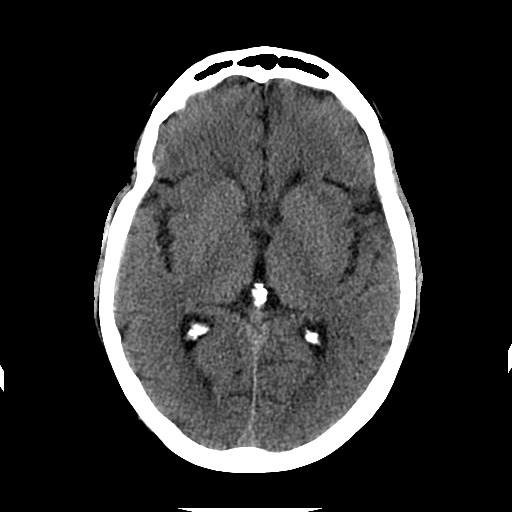
[im 17/31  brain]
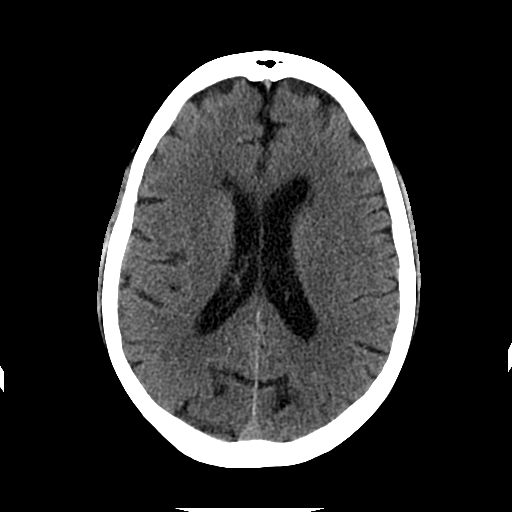
[im 17/31  bone]
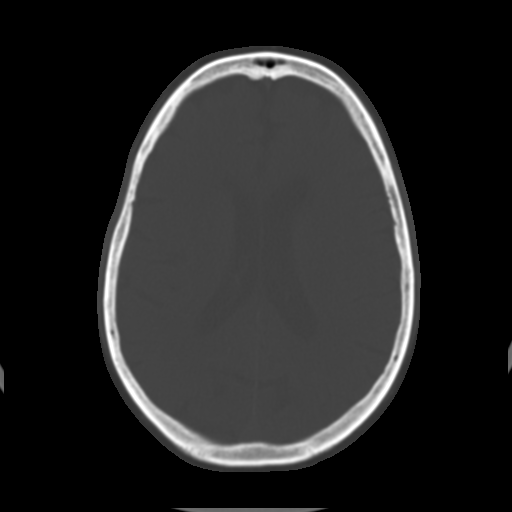
[im 21/31  brain]
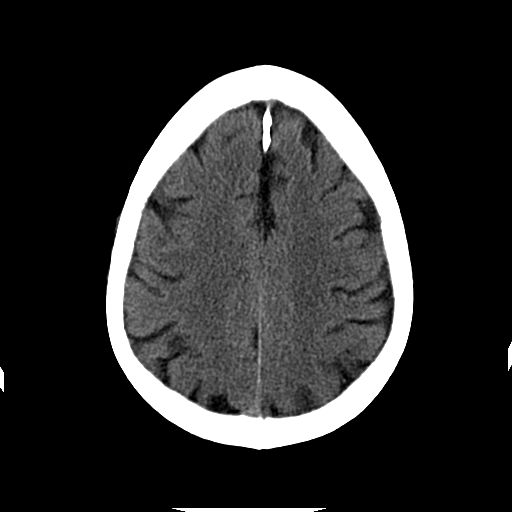
[im 24/31  brain]
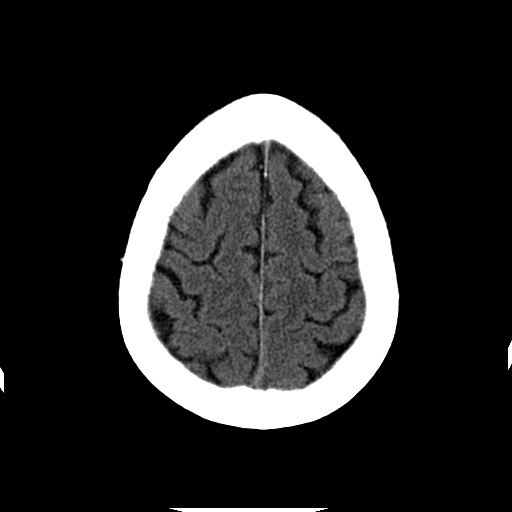
[im 27/31  brain]
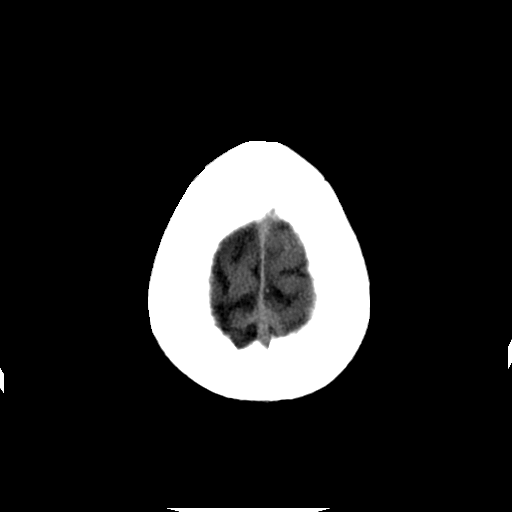

[Series 202: head w/o bone, idose (1) · axial · non-contrast · 0.45mm/px · z∈[+89,+161]mm · 5 of 62 slices shown]
[im 7/62  bone]
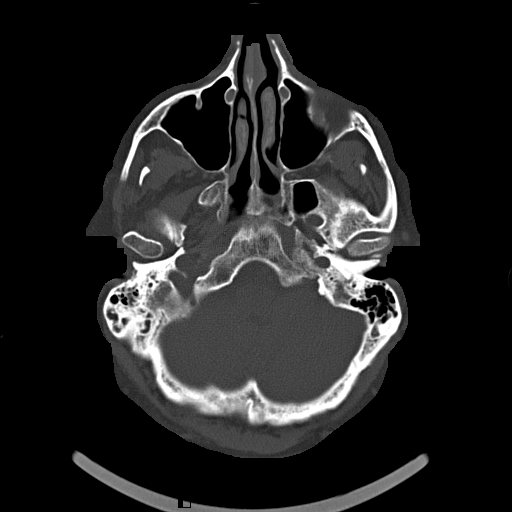
[im 13/62  bone]
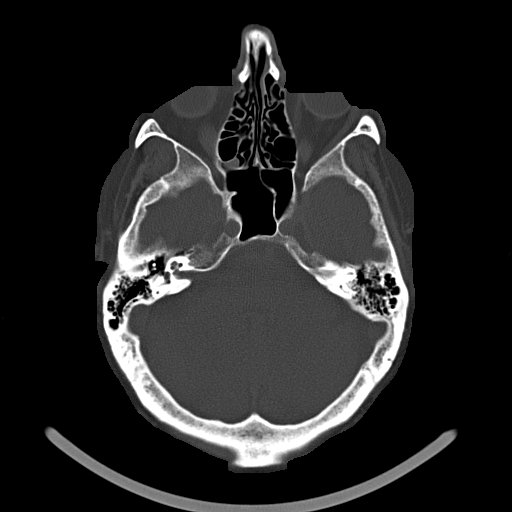
[im 20/62  bone]
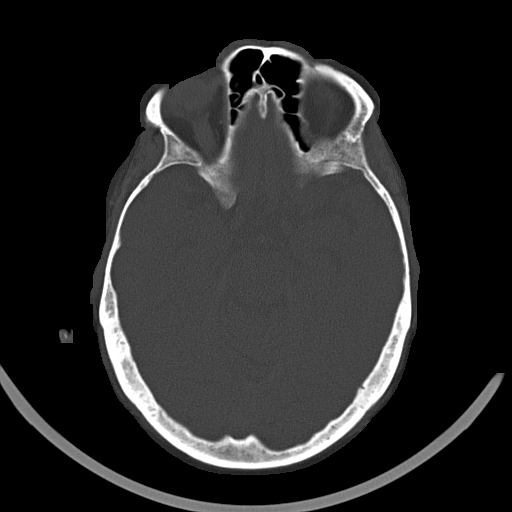
[im 26/62  bone]
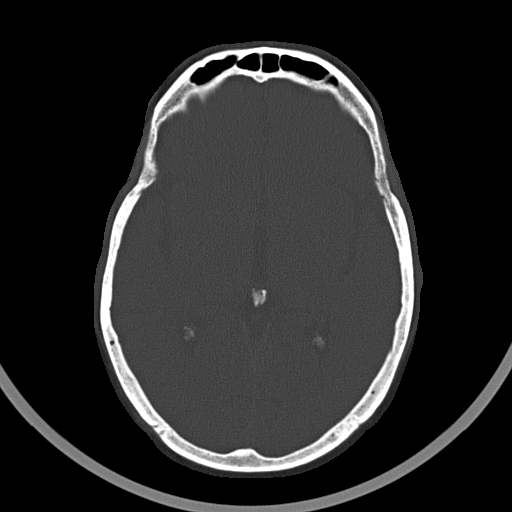
[im 36/62  bone]
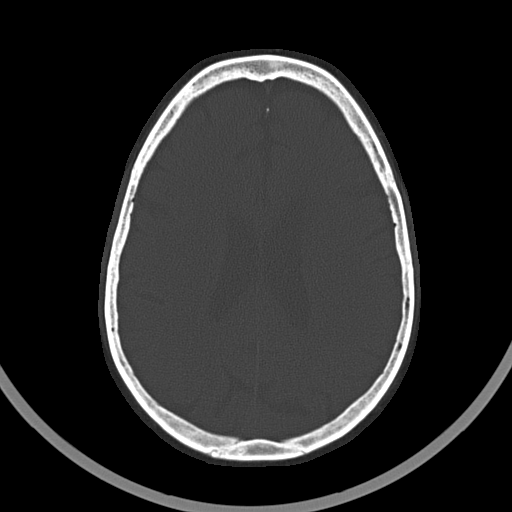

[Series 203: coronal st, idose (1) · coronal · 0.40mm/px · 3 of 76 slices shown]
[im 26/76  brain]
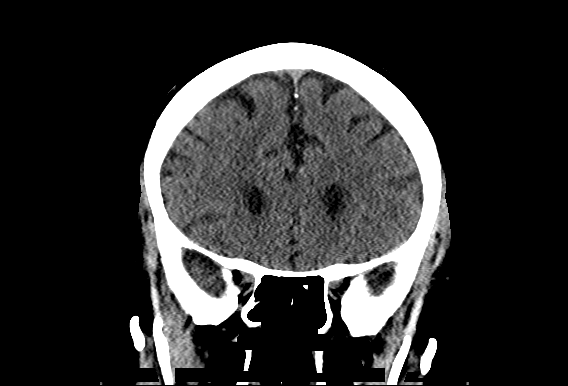
[im 34/76  brain]
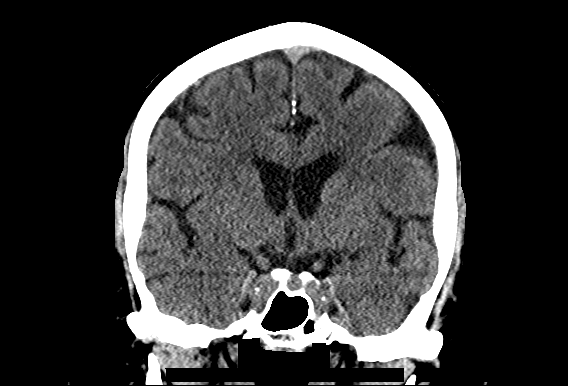
[im 42/76  brain]
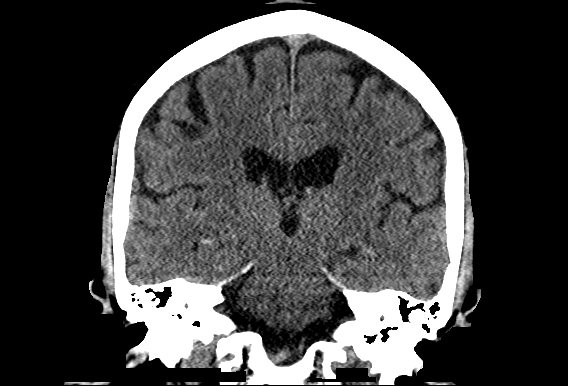

[Series 204: sagittal st, idose (1) · sagittal · 0.40mm/px · 3 of 76 slices shown]
[im 26/76  brain]
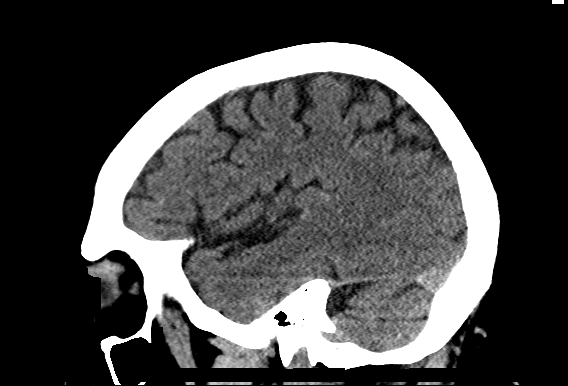
[im 38/76  brain]
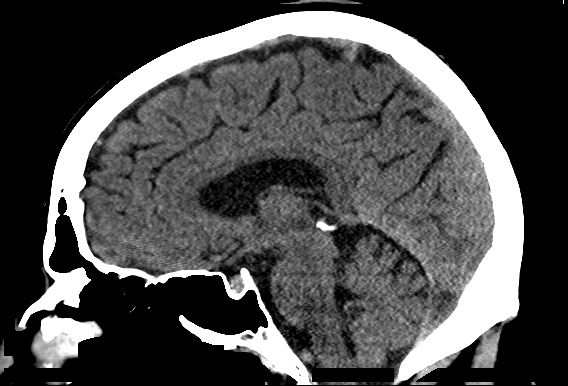
[im 51/76  brain]
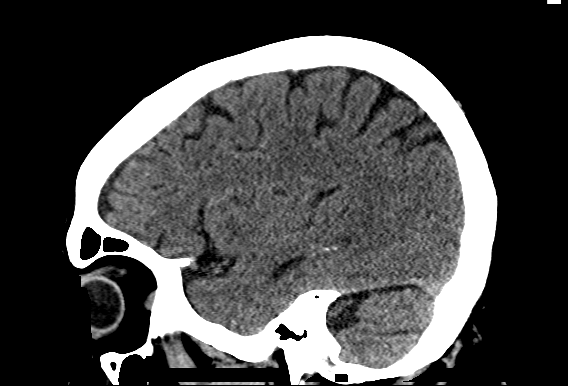

[19 of 47 positions shown; findings below may reference images not displayed]

FINDINGS: Brain: No acute intracranial abnormality. Specifically, no
hemorrhage, hydrocephalus, mass lesion, acute infarction, or
significant intracranial injury.

Vascular: No hyperdense vessel or unexpected calcification.

Skull: No acute calvarial abnormality

Sinuses/Orbits: Visualized paranasal sinuses and mastoids clear.
Orbital soft tissues unremarkable.

Other: None
IMPRESSION: Negative study.

## 2017-08-29 ENCOUNTER — Telehealth: Payer: Self-pay | Admitting: Cardiology

## 2017-08-29 DIAGNOSIS — I480 Paroxysmal atrial fibrillation: Secondary | ICD-10-CM

## 2017-08-29 DIAGNOSIS — I4819 Other persistent atrial fibrillation: Secondary | ICD-10-CM

## 2017-08-29 MED ORDER — RIVAROXABAN 20 MG PO TABS: 20.0000 mg | ORAL_TABLET | Freq: Every day | ORAL | 0 refills | Status: DC

## 2017-08-29 NOTE — Telephone Encounter (Signed)
AWARE SAMPLES X 2 ARE AVAILABLE FOR PICK UP

## 2017-08-29 NOTE — Telephone Encounter (Signed)
Patient calling the office for samples of medication: ° ° °1.  What medication and dosage are you requesting samples for? Xarelto  ° °2.  Are you currently out of this medication? yes ° ° °

## 2017-09-12 ENCOUNTER — Telehealth: Payer: Self-pay | Admitting: Cardiology

## 2017-09-12 DIAGNOSIS — I4819 Other persistent atrial fibrillation: Secondary | ICD-10-CM

## 2017-09-12 DIAGNOSIS — I480 Paroxysmal atrial fibrillation: Secondary | ICD-10-CM

## 2017-09-12 MED ORDER — RIVAROXABAN 20 MG PO TABS: 20.0000 mg | ORAL_TABLET | Freq: Every day | ORAL | 0 refills | Status: DC

## 2017-09-12 NOTE — Telephone Encounter (Signed)
New Messaage   Patient calling the office for samples of medication:   1.  What medication and dosage are you requesting samples for? Xarelto 20mg   2.  Are you currently out of this medication? Just one left.

## 2017-10-25 ENCOUNTER — Other Ambulatory Visit: Payer: Self-pay | Admitting: Cardiology

## 2017-10-25 DIAGNOSIS — I4819 Other persistent atrial fibrillation: Secondary | ICD-10-CM

## 2017-10-28 ENCOUNTER — Telehealth: Payer: Self-pay | Admitting: Cardiology

## 2017-10-28 NOTE — Telephone Encounter (Signed)
Xarelto 20 mg #3 lot # [id number] exp 2-21 Advised wife at front for pick up

## 2017-10-28 NOTE — Telephone Encounter (Signed)
New message ° ° °Patient calling the office for samples of medication: ° ° °1.  What medication and dosage are you requesting samples for?XARELTO 20 MG TABS tablet ° °2.  Are you currently out of this medication? yes ° ° ° ° °

## 2017-12-04 ENCOUNTER — Other Ambulatory Visit: Payer: Self-pay | Admitting: Cardiology

## 2017-12-05 ENCOUNTER — Encounter: Payer: Self-pay | Admitting: Cardiology

## 2017-12-09 NOTE — Progress Notes (Signed)
Gregory GlazierJack S Wheeler Date of Birth: 1940/11/10   History of Present Illness: Gregory Wheeler is seen for follow up of CAD and atrial fibrillation.  He has a history of coronary disease and is status post stenting of the LAD with overlapping Taxus stents in 2009. His last myoview in April 2015 showed mild apical thinning, normal EF 67%.  He  was seen in the ED in February 2016 for symptoms of left arm numbness and pain. This was worse with lifting his arm. Ecg was done and demonstrated tachycardia with no clear P waves and rate 113. He was treated with a muscle relaxant.  He was seen in June 2016 symptoms of fatigue and acute gout flair in his right knee bursa. He was noted to be in atrial fibrillation with RVR (and review of ECG from February showed the same). He had been drinking Etoh heavily. He was started on metoprolol and Xarelto.   He was scheduled for a DCCV on 03/03/15 but on arrival he was in NSR with rate 77. He was seen in April 2017 with symptoms of fatigue related to low BP. This was in the setting of taking some of his wife's lasix.  He was  switched to Coumadin due to the cost of Xarelto. He states he hates Coumadin and went back to taking Xarelto.   On follow up today he is doing very well. He denies any SOB or palpitations.  Gout has been controlled. BP has been well controlled. He has rare fleeting left precordial chest pain lasting less than one minute.  His wife has been  ill with pancreatic CA and also fell and broke her hip. He has lost weight- an additional 8 lbs. Still drinking some beer. He does note his feet stay cold and sore all the time. Feels like needles.     Current Outpatient Medications on File Prior to Visit  Medication Sig Dispense Refill  . losartan (COZAAR) 25 MG tablet Take 1 tablet (25 mg total) by mouth daily. KEEP OV. 90 tablet 0  . metoprolol succinate (TOPROL-XL) 50 MG 24 hr tablet TAKE 1 TABLET DAILY WITH OR IMMEDIATELY FOLLOWING A MEAL 90 tablet 1  . XARELTO 20  MG TABS tablet TAKE 1 TABLET BY MOUTH ONCE DAILY WITH SUPPER 30 tablet 2   No current facility-administered medications on file prior to visit.     No Known Allergies  Past Medical History:  Diagnosis Date  . Arthritis   . Atrial fibrillation (HCC) 01/31/2015  . Chronic gout   . Coronary artery disease 12/2007   STATUS POST STENTING OF THE LEFT ANTERIOR DESCENDING ARTERY  . Dyslipidemia   . Gout   . Hiatal hernia   . Hyperlipidemia   . Hypertension   . LBP (low back pain)   . Partial small bowel obstruction (HCC)   . RBBB (right bundle branch block)   . SBO (small bowel obstruction) (HCC)    PARTIAL  . Second degree AV block, Mobitz type I   . Urinary tract infection    Escherichia coli urinary tract infection    Past Surgical History:  Procedure Laterality Date  . CARDIAC CATHETERIZATION    . HERNIA REPAIR     INGUINAL  . ROTATOR CUFF REPAIR      Social History   Tobacco Use  Smoking Status Former Smoker  . Last attempt to quit: 08/27/1990  . Years since quitting: 27.3  Smokeless Tobacco Never Used    Social History  Substance and Sexual Activity  Alcohol Use Yes   Comment: Drinks every day.    Family History  Problem Relation Age of Onset  . Emphysema Mother 60  . Heart attack Father 47    Review of Systems: As noted in HPI All other systems were reviewed and are negative.  Physical Exam: BP 130/82   Pulse 76   Ht 5\' 9"  (1.753 m)   Wt 211 lb (95.7 kg)   BMI 31.16 kg/m  GENERAL:  Well appearing WM in NAD HEENT:  PERRL, EOMI, sclera are clear. Oropharynx is clear. NECK:  No jugular venous distention, carotid upstroke brisk and symmetric, no bruits, no thyromegaly or adenopathy LUNGS:  Clear to auscultation bilaterally CHEST:  Unremarkable HEART:  RRR,  PMI not displaced or sustained,S1 and S2 within normal limits, no S3, no S4: no clicks, no rubs, no murmurs ABD:  Soft, nontender. BS +, no masses or bruits. No hepatomegaly, no splenomegaly EXT:   2 + pulses throughout, no edema, no cyanosis no clubbing SKIN:  Warm and dry.  No rashes NEURO:  Alert and oriented x 3. Cranial nerves II through XII intact. PSYCH:  Cognitively intact      LABORATORY DATA: Lab Results  Component Value Date   WBC 7.9 06/10/2017   HGB 15.5 06/10/2017   HCT 44.3 06/10/2017   PLT 199 06/10/2017   GLUCOSE 98 06/10/2017   CHOL 176 06/10/2017   TRIG 125 06/10/2017   HDL 66 06/10/2017   LDLCALC 85 06/10/2017   ALT 23 06/10/2017   AST 30 06/10/2017   NA 138 06/10/2017   K 4.7 06/10/2017   CL 98 06/10/2017   CREATININE 1.26 06/10/2017   BUN 12 06/10/2017   CO2 24 06/10/2017   TSH 3.340 06/10/2017   INR 1.4 04/24/2016   Echo: 02/09/15  Study Conclusions  - Left ventricle: The cavity size was normal. There was mild focal basal hypertrophy of the septum. Systolic function was normal. The estimated ejection fraction was in the range of 60% to 65%. Wall motion was normal; there were no regional wall motion abnormalities. - Aortic valve: Trileaflet; normal thickness, mildly calcified leaflets. - Mitral valve: Calcified annulus. - Pulmonic valve: There was trivial regurgitation.  Ecg today shows NSR with RBBB. I have personally reviewed and interpreted this study.  Assessment / Plan: 1. Coronary disease status post stenting of the LAD in 2009 with Taxus stents. Nuclear stress test in April 2015 showed a small apical infarct with normal ejection fraction. He is minimally symptomatic.  Continue with risk factor modification. Antiplatelet therapy stopped now that he is on Xarelto. Continue Toprol XL.   2. Atrial fibrillation- paroxysmal. In NSR today.   He is asymptomatic.  [place] vasc score is 3. . Recommend continued  abstinence from alcohol. Avoid NSAIDs. Now on Xarelto.   3. Hyperlipidemia  on simvastatin. Follow up fasting labs looked good in October  4. History of Mobitz type 1 second degree AV block.   5. HTN controlled  6. Gout.  Controlled. Will refill allopurinol   7. Etoh abuse. Recommend total abstinence.   8. Obesity. Encouraged continued weight loss.  9. Foot pain and dysesthesia - normal pulses. 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 Will check B 12 and TSH with next blood draw. May be related to Etoh use. Will try gabapentin 100 mg tid for symptoms. If no better should see primary care for further work up.   I will follow up in 6 months.

## 2017-12-11 ENCOUNTER — Encounter: Payer: Self-pay | Admitting: Cardiology

## 2017-12-11 ENCOUNTER — Ambulatory Visit (INDEPENDENT_AMBULATORY_CARE_PROVIDER_SITE_OTHER): Payer: Medicare Other | Admitting: Cardiology

## 2017-12-11 VITALS — BP 130/82 | HR 76 | Ht 69.0 in | Wt 211.0 lb

## 2017-12-11 DIAGNOSIS — I480 Paroxysmal atrial fibrillation: Secondary | ICD-10-CM

## 2017-12-11 DIAGNOSIS — I4510 Unspecified right bundle-branch block: Secondary | ICD-10-CM

## 2017-12-11 DIAGNOSIS — I2510 Atherosclerotic heart disease of native coronary artery without angina pectoris: Secondary | ICD-10-CM

## 2017-12-11 DIAGNOSIS — E782 Mixed hyperlipidemia: Secondary | ICD-10-CM | POA: Diagnosis not present

## 2017-12-11 MED ORDER — ALLOPURINOL 300 MG PO TABS: 300.0000 mg | ORAL_TABLET | Freq: Every day | ORAL | 3 refills | Status: DC

## 2017-12-11 MED ORDER — GABAPENTIN 100 MG PO CAPS
100.0000 mg | ORAL_CAPSULE | Freq: Three times a day (TID) | ORAL | 3 refills | Status: DC
Start: 2017-12-11 — End: 2018-07-01

## 2017-12-11 NOTE — Patient Instructions (Signed)
Continue your current therapy  Try gabapentin 100 mg three times a day for foot discomfort.

## 2018-02-14 ENCOUNTER — Other Ambulatory Visit: Payer: Self-pay | Admitting: Cardiology

## 2018-02-14 ENCOUNTER — Telehealth: Payer: Self-pay | Admitting: Cardiology

## 2018-02-14 DIAGNOSIS — I4819 Other persistent atrial fibrillation: Secondary | ICD-10-CM

## 2018-02-14 MED ORDER — RIVAROXABAN 20 MG PO TABS: ORAL_TABLET | ORAL | 0 refills | Status: DC

## 2018-02-14 MED ORDER — RIVAROXABAN 20 MG PO TABS: ORAL_TABLET | ORAL | 1 refills | Status: DC

## 2018-02-14 MED ORDER — METOPROLOL SUCCINATE ER 50 MG PO TB24: 50.0000 mg | ORAL_TABLET | Freq: Every day | ORAL | 0 refills | Status: DC

## 2018-02-14 MED ORDER — METOPROLOL SUCCINATE ER 50 MG PO TB24: ORAL_TABLET | ORAL | 0 refills | Status: DC

## 2018-02-14 MED ORDER — LOSARTAN POTASSIUM 25 MG PO TABS: 25.0000 mg | ORAL_TABLET | Freq: Every day | ORAL | 3 refills | Status: DC

## 2018-02-14 MED ORDER — METOPROLOL SUCCINATE ER 50 MG PO TB24: 50.0000 mg | ORAL_TABLET | Freq: Every day | ORAL | 3 refills | Status: DC

## 2018-02-14 NOTE — Telephone Encounter (Signed)
Rx(s) sent to pharmacy electronically.  

## 2018-02-14 NOTE — Telephone Encounter (Signed)
Patient calling the office for samples of medication: ° ° °1.  What medication and dosage are you requesting samples for? Xarelto ° °2.  Are you currently out of this medication?  A few ° ° ° °

## 2018-02-14 NOTE — Telephone Encounter (Signed)
SPOKE TO VIOLET - AWARE SAMPLES X 2 ARE AVAILABLE FOR PICK UP. SHE ATATES IT WILL BE PICKED UP TODAY.

## 2018-02-14 NOTE — Telephone Encounter (Signed)
°*  STAT* If patient is at the pharmacy, call can be transferred to refill team.   1. Which medications need to be refilled? (please list name of each medication and dose if known) Metoprolol-need a new prescription called in until mail order comes in- Losartan and Xarelto- [facility]  2. Which pharmacy/location (including street and city if local pharmacy) is medication to be sent to?[facility] [phone] also please [facility] 3. Do they need a 30 day or 90 day supply? 30 for [facility] 90 and refills- Losartan and Xarelto  90 and refills

## 2018-04-07 ENCOUNTER — Telehealth: Payer: Self-pay | Admitting: Cardiology

## 2018-04-07 ENCOUNTER — Other Ambulatory Visit: Payer: Self-pay | Admitting: Cardiology

## 2018-04-07 DIAGNOSIS — I4819 Other persistent atrial fibrillation: Secondary | ICD-10-CM

## 2018-04-07 NOTE — Telephone Encounter (Signed)
Medication samples have been provided to the patient.  Drug name: xarelto 20mg   Qty: 3 bottles  LOT: [id number]  Exp.Date: 01/2020  Samples left at front desk for patient pick-up. Patient notified.  Julaine Fusilkins, Jenna M 9:06 AM 04/07/2018

## 2018-04-07 NOTE — Telephone Encounter (Signed)
New Message ° ° ° °Patient calling the office for samples of medication: ° ° °1.  What medication and dosage are you requesting samples for? rivaroxaban (XARELTO) 20 MG TABS tablet ° °2.  Are you currently out of this medication? yes ° ° ° °

## 2018-04-07 NOTE — Telephone Encounter (Signed)
New Message    *STAT* If patient is at the pharmacy, call can be transferred to refill team.   1. Which medications need to be refilled? (please list name of each medication and dose if known) rivaroxaban (XARELTO) 20 MG TABS tablet  2. Which pharmacy/location (including street and city if local pharmacy) is medication to be sent to? [facility] [place] - [phone]  3. Do they need a 30 day or 90 day supply? 90 day

## 2018-04-07 NOTE — Telephone Encounter (Signed)
Follow up     Rerouting message

## 2018-04-08 MED ORDER — RIVAROXABAN 20 MG PO TABS: ORAL_TABLET | ORAL | 0 refills | Status: DC

## 2018-04-29 ENCOUNTER — Telehealth: Payer: Self-pay | Admitting: Cardiology

## 2018-04-29 DIAGNOSIS — I4819 Other persistent atrial fibrillation: Secondary | ICD-10-CM

## 2018-04-29 MED ORDER — RIVAROXABAN 20 MG PO TABS: ORAL_TABLET | ORAL | 1 refills | Status: DC

## 2018-04-29 MED ORDER — RIVAROXABAN 20 MG PO TABS: ORAL_TABLET | ORAL | 2 refills | Status: DC

## 2018-04-29 NOTE — Telephone Encounter (Signed)
Medication samples have been provided to the patient.  Drug name: Xarelto 20 mg  Qty: 14    LOT: [id number]  Exp.Date: 06/21  Samples left at front desk for patient pick-up. Daughter notified (ok per DPR).   Daughter states rx was suppose to be sent to [facility] but they have not received this rx.    Advised this was sent 8/13 but would resend and confirm this is received.     Called [facility]-advised rx on 8/13 was cancelled because they do not have a card on file for copay.    Advised daughter of this and she request to send new rx to [facility] in [place].   New rx sent.

## 2018-04-29 NOTE — Telephone Encounter (Signed)
New Message    Patient calling the office for samples of medication:   1.  What medication and dosage are you requesting samples for? Xarelto   2.  Are you currently out of this medication? 1 day remaining

## 2018-04-29 NOTE — Telephone Encounter (Signed)
New Message     *STAT* If patient is at the pharmacy, call can be transferred to refill team.   1. Which medications need to be refilled? (please list name of each medication and dose if known) Xarelto  2. Which pharmacy/location (including street and city if local pharmacy) is medication to be sent to? [facility] - [place], [place] - [address]  3. Do they need a 30 day or 90 day supply? 90 day

## 2018-05-26 ENCOUNTER — Telehealth: Payer: Self-pay | Admitting: Cardiology

## 2018-05-26 NOTE — Telephone Encounter (Signed)
° °  Patient's daughter in law calling, requesting her father be called regarding depression. His wife recently expired. The daughter is concerned that he may need "medication" to help him, but has not reached out to his PCP.

## 2018-05-26 NOTE — Telephone Encounter (Signed)
Returned call to son, sympathy expressed.Advised to call father's PCP for depression medication.

## 2018-06-02 ENCOUNTER — Other Ambulatory Visit: Payer: Medicare Other | Admitting: Orthotics

## 2018-06-02 ENCOUNTER — Ambulatory Visit (INDEPENDENT_AMBULATORY_CARE_PROVIDER_SITE_OTHER): Payer: Medicare Other

## 2018-06-02 ENCOUNTER — Telehealth: Payer: Self-pay | Admitting: Podiatry

## 2018-06-02 ENCOUNTER — Ambulatory Visit (INDEPENDENT_AMBULATORY_CARE_PROVIDER_SITE_OTHER): Payer: Medicare Other | Admitting: Podiatry

## 2018-06-02 ENCOUNTER — Other Ambulatory Visit: Payer: Self-pay | Admitting: Podiatry

## 2018-06-02 ENCOUNTER — Encounter: Payer: Self-pay | Admitting: Podiatry

## 2018-06-02 VITALS — BP 159/87 | HR 65 | Resp 16

## 2018-06-02 DIAGNOSIS — D689 Coagulation defect, unspecified: Secondary | ICD-10-CM

## 2018-06-02 DIAGNOSIS — M2042 Other hammer toe(s) (acquired), left foot: Secondary | ICD-10-CM

## 2018-06-02 DIAGNOSIS — I2510 Atherosclerotic heart disease of native coronary artery without angina pectoris: Secondary | ICD-10-CM | POA: Diagnosis not present

## 2018-06-02 DIAGNOSIS — R2689 Other abnormalities of gait and mobility: Secondary | ICD-10-CM

## 2018-06-02 DIAGNOSIS — M2041 Other hammer toe(s) (acquired), right foot: Secondary | ICD-10-CM

## 2018-06-02 DIAGNOSIS — M79672 Pain in left foot: Principal | ICD-10-CM

## 2018-06-02 DIAGNOSIS — M79671 Pain in right foot: Secondary | ICD-10-CM | POA: Diagnosis not present

## 2018-06-02 DIAGNOSIS — L84 Corns and callosities: Secondary | ICD-10-CM

## 2018-06-02 NOTE — Telephone Encounter (Signed)
Pt son called and wanted to know could Dr. Charlsie Merles up the Dosage on his gabapentin. Please give pt a call

## 2018-06-02 NOTE — Progress Notes (Signed)
Subjective:   Patient ID: Gregory Wheeler, male   DOB: 77 y.o.   MRN: [id number]   HPI Patient presents with caregiver stating that he is losing his balance on both feet and over the last year he has developed increased shooting and burning pains in both of his feet.  He tried gabapentin which has not helped him and he does feel unsteady and would like to be more active and also has lesions on the bottom of the left foot that are becoming painful.  Patient does not smoke and likes to be active   Review of Systems  All other systems reviewed and are negative.       Objective:  Physical Exam  Constitutional: He appears well-developed and well-nourished.  Cardiovascular: Intact distal pulses.  Pulmonary/Chest: Effort normal.  Musculoskeletal: Normal range of motion.  Neurological: He is alert.  Skin: Skin is warm.  Nursing note and vitals reviewed.   Neurovascular status found to be intact with patient found to have mild diminishment of sharp dull vibratory.  Patient does have keratotic lesion sub-first and fifth metatarsal left that are painful when palpated and does appear to have balance issues and has mild sway when evaluated.  Patient had a history of low back pain also and does have burning shooting pains in his feet especially at nighttime and some during the day.  Patient has good digital perfusion well oriented x3     Assessment:  Probability for neuropathic-like symptomatology with patient developed increasingly more balance issues and has had several falls and has keratotic lesion sub-first fifth metatarsal left     Plan:  H&P and discussed problems at great length reviewing the balance issues he is experiencing and the neuropathic type pain.  At this point we may try Lyrica in the future but will get a try to hold off as he did not do well with gabapentin and I recommended balance bracing to try to provide for more stability for this patient and help with his balance issues.  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

## 2018-06-02 NOTE — Progress Notes (Signed)
   Subjective:    Patient ID: Gregory Wheeler, male    DOB: 07/09/1941, 77 y.o.   MRN: [id number]  HPI    Review of Systems  All other systems reviewed and are negative.      Objective:   Physical Exam        Assessment & Plan:

## 2018-06-03 ENCOUNTER — Other Ambulatory Visit: Payer: Medicare Other

## 2018-06-03 NOTE — Telephone Encounter (Signed)
He will have to go thru the Doctor who wrote the original prescription to up the dose.

## 2018-06-03 NOTE — Telephone Encounter (Signed)
I informed pt's son, Christen Bame of Dr. Beverlee Nims recommendation to discuss the increase of the Gabapentin.

## 2018-06-06 ENCOUNTER — Telehealth: Payer: Self-pay | Admitting: *Deleted

## 2018-06-06 NOTE — Telephone Encounter (Signed)
Lequita Halt [facility] states he did not want PT at this time.

## 2018-06-20 ENCOUNTER — Encounter (HOSPITAL_BASED_OUTPATIENT_CLINIC_OR_DEPARTMENT_OTHER): Payer: Self-pay

## 2018-06-20 ENCOUNTER — Emergency Department (HOSPITAL_BASED_OUTPATIENT_CLINIC_OR_DEPARTMENT_OTHER)
Admission: EM | Admit: 2018-06-20 | Discharge: 2018-06-20 | Disposition: A | Discharge status: Home / Self Care | Payer: Medicare Other | Attending: Emergency Medicine | Admitting: Emergency Medicine

## 2018-06-20 ENCOUNTER — Emergency Department (HOSPITAL_BASED_OUTPATIENT_CLINIC_OR_DEPARTMENT_OTHER): Payer: Medicare Other

## 2018-06-20 ENCOUNTER — Other Ambulatory Visit: Payer: Self-pay

## 2018-06-20 DIAGNOSIS — I10 Essential (primary) hypertension: Secondary | ICD-10-CM | POA: Insufficient documentation

## 2018-06-20 DIAGNOSIS — R112 Nausea with vomiting, unspecified: Secondary | ICD-10-CM | POA: Diagnosis not present

## 2018-06-20 DIAGNOSIS — Z8679 Personal history of other diseases of the circulatory system: Secondary | ICD-10-CM | POA: Insufficient documentation

## 2018-06-20 DIAGNOSIS — R1033 Periumbilical pain: Secondary | ICD-10-CM | POA: Diagnosis not present

## 2018-06-20 DIAGNOSIS — K8020 Calculus of gallbladder without cholecystitis without obstruction: Secondary | ICD-10-CM | POA: Diagnosis not present

## 2018-06-20 DIAGNOSIS — I2510 Atherosclerotic heart disease of native coronary artery without angina pectoris: Secondary | ICD-10-CM | POA: Insufficient documentation

## 2018-06-20 DIAGNOSIS — Z87891 Personal history of nicotine dependence: Secondary | ICD-10-CM | POA: Insufficient documentation

## 2018-06-20 DIAGNOSIS — Z7901 Long term (current) use of anticoagulants: Secondary | ICD-10-CM | POA: Diagnosis not present

## 2018-06-20 DIAGNOSIS — K429 Umbilical hernia without obstruction or gangrene: Secondary | ICD-10-CM | POA: Diagnosis not present

## 2018-06-20 LAB — CBC WITH DIFFERENTIAL/PLATELET
ABS IMMATURE GRANULOCYTES: 0.08 10*3/uL — AB (ref 0.00–0.07)
BASOS ABS: 0.1 10*3/uL (ref 0.0–0.1)
Basophils Relative: 1 %
Eosinophils Absolute: 0.3 10*3/uL (ref 0.0–0.5)
Eosinophils Relative: 2 %
HCT: 42 % (ref 39.0–52.0)
HEMOGLOBIN: 13.9 g/dL (ref 13.0–17.0)
IMMATURE GRANULOCYTES: 1 %
Lymphocytes Relative: 8 %
Lymphs Abs: 0.9 10*3/uL (ref 0.7–4.0)
MCH: 35.1 pg — ABNORMAL HIGH (ref 26.0–34.0)
MCHC: 33.1 g/dL (ref 30.0–36.0)
MCV: 106.1 fL — ABNORMAL HIGH (ref 80.0–100.0)
Monocytes Absolute: 0.6 10*3/uL (ref 0.1–1.0)
Monocytes Relative: 5 %
NEUTROS ABS: 10 10*3/uL — AB (ref 1.7–7.7)
NEUTROS PCT: 83 %
NRBC: 0 % (ref 0.0–0.2)
Platelets: 188 10*3/uL (ref 150–400)
RBC: 3.96 MIL/uL — ABNORMAL LOW (ref 4.22–5.81)
RDW: 11.9 % (ref 11.5–15.5)
WBC: 11.9 10*3/uL — ABNORMAL HIGH (ref 4.0–10.5)

## 2018-06-20 LAB — COMPREHENSIVE METABOLIC PANEL
ALBUMIN: 4 g/dL (ref 3.5–5.0)
ALT: 22 U/L (ref 0–44)
ANION GAP: 10 (ref 5–15)
AST: 22 U/L (ref 15–41)
Alkaline Phosphatase: 57 U/L (ref 38–126)
BUN: 10 mg/dL (ref 8–23)
CO2: 28 mmol/L (ref 22–32)
Calcium: 9.9 mg/dL (ref 8.9–10.3)
Chloride: 98 mmol/L (ref 98–111)
Creatinine, Ser: 0.98 mg/dL (ref 0.61–1.24)
GFR calc Af Amer: >60 mL/min (ref >60)
GFR calc non Af Amer: >60 mL/min (ref >60)
GLUCOSE: 113 mg/dL — AB (ref 70–99)
POTASSIUM: 4.1 mmol/L (ref 3.5–5.1)
SODIUM: 136 mmol/L (ref 135–145)
Total Bilirubin: 0.8 mg/dL (ref 0.3–1.2)
Total Protein: 7.1 g/dL (ref 6.5–8.1)

## 2018-06-20 LAB — I-STAT CG4 LACTIC ACID, ED: Lactic Acid, Venous: 1.46 mmol/L (ref 0.5–1.9)

## 2018-06-20 MED ORDER — SODIUM CHLORIDE 0.9 % IV BOLUS
500.0000 mL | Freq: Once | INTRAVENOUS | Status: AC
  Administered 2018-06-20: 500 mL via INTRAVENOUS

## 2018-06-20 MED ORDER — ONDANSETRON HCL 4 MG/2ML IJ SOLN
INTRAMUSCULAR | Status: AC
  Filled 2018-06-20: qty 2

## 2018-06-20 MED ORDER — ONDANSETRON HCL 4 MG/2ML IJ SOLN
4.0000 mg | Freq: Once | INTRAMUSCULAR | Status: AC
  Administered 2018-06-20: 4 mg via INTRAVENOUS

## 2018-06-20 MED ORDER — HYDROMORPHONE HCL 1 MG/ML IJ SOLN
1.0000 mg | Freq: Once | INTRAMUSCULAR | Status: AC
  Administered 2018-06-20: 1 mg via INTRAVENOUS
  Filled 2018-06-20: qty 1

## 2018-06-20 MED ORDER — IOPAMIDOL (ISOVUE-300) INJECTION 61%
100.0000 mL | Freq: Once | INTRAVENOUS | Status: AC | PRN
  Administered 2018-06-20: 100 mL via INTRAVENOUS

## 2018-06-20 NOTE — ED Provider Notes (Signed)
Emergency Department Provider Note   I have reviewed the triage vital signs and the nursing notes.   HISTORY  Chief Complaint Abdominal Pain   HPI Gregory Wheeler is a 77 y.o. male with PMH of a-fib, HLD, HTN, SBO, and arthritis resents to the emergency department for evaluation of abdominal pain with bulging, firm mass at the umbilicus.  Symptoms began this morning with nausea vomiting starting around 8 AM.  He did have a bowel movement earlier today.  States he is felt increasingly uncomfortable at home and finally presented to the emergency department when he could no longer take it.  Patient is on Xarelto for a-fib. No fever or chills. Pain is constant and non-radiating.   Past Medical History:  Diagnosis Date  . Arthritis   . Atrial fibrillation (HCC) 01/31/2015  . Chronic gout   . Coronary artery disease 12/2007   STATUS POST STENTING OF THE LEFT ANTERIOR DESCENDING ARTERY  . Dyslipidemia   . Gout   . Hiatal hernia   . Hyperlipidemia   . Hypertension   . LBP (low back pain)   . Partial small bowel obstruction (HCC)   . RBBB (right bundle branch block)   . SBO (small bowel obstruction) (HCC)    PARTIAL  . Second degree AV block, Mobitz type I   . Urinary tract infection    Escherichia coli urinary tract infection    Patient Active Problem List   Diagnosis Date Noted  . Eyvonne Burchfield term (current) use of anticoagulants [Z79.01] 02/22/2016  . Hypotension 12/21/2015  . Fatigue 12/21/2015  . Alcohol abuse 02/04/2015  . Infected prepatellar bursa 02/03/2015  . Effusion of right knee 02/03/2015  . AKI (acute kidney injury) (HCC) 02/03/2015  . Chronic atrial fibrillation 02/03/2015  . Knee effusion 02/03/2015  . Atrial fibrillation (HCC) 01/31/2015  . Gout 01/31/2015  . Second degree AV block, Mobitz type I 12/09/2013  . RBBB (right bundle branch block) 01/03/2011  . Hypertension   . Hyperlipidemia   . SBO (small bowel obstruction) (HCC)   . LBP (low back pain)   .  Arthritis   . Gout   . Coronary artery disease 12/26/2007    Past Surgical History:  Procedure Laterality Date  . CARDIAC CATHETERIZATION    . HERNIA REPAIR     INGUINAL  . ROTATOR CUFF REPAIR      Allergies Patient has no known allergies.  Family History  Problem Relation Age of Onset  . Emphysema Mother 60  . Heart attack Father 62    Social History Social History   Tobacco Use  . Smoking status: Former Smoker    Last attempt to quit: 08/27/1990    Years since quitting: 27.8  . Smokeless tobacco: Never Used  Substance Use Topics  . Alcohol use: Yes    Comment: near daily  . Drug use: No    Review of Systems  Constitutional: No fever/chills Eyes: No visual changes. ENT: No sore throat. Cardiovascular: Denies chest pain. Respiratory: Denies shortness of breath. Gastrointestinal: Positive periumbilical abdominal pain. Positive nausea and vomiting.  No diarrhea.  No constipation. Genitourinary: Negative for dysuria. Musculoskeletal: Negative for back pain. Skin: Negative for rash. Neurological: Negative for headaches, focal weakness or numbness.  10-point ROS otherwise negative.  ____________________________________________   PHYSICAL EXAM:  VITAL SIGNS: ED Triage Vitals  Enc Vitals Group     BP 06/20/18 1407 (!) 162/105     Pulse Rate 06/20/18 1407 78     Resp  06/20/18 1407 18     Temp 06/20/18 1407 98.7 F (37.1 C)     Temp Source 06/20/18 1407 Oral     SpO2 06/20/18 1407 96 %     Weight 06/20/18 1407 211 lb (95.7 kg)     Height 06/20/18 1407 5\' 10"  (1.778 m)     Pain Score 06/20/18 1405 10   Constitutional: Alert and oriented. Well appearing and in no acute distress. Eyes: Conjunctivae are normal. Head: Atraumatic. Nose: No congestion/rhinnorhea. Mouth/Throat: Mucous membranes are moist.  Oropharynx non-erythematous. Neck: No stridor.   Cardiovascular: Normal rate, regular rhythm. Good peripheral circulation. Grossly normal heart sounds.     Respiratory: Normal respiratory effort.  No retractions. Lungs CTAB. Gastrointestinal: Soft with periumbilical hernia which is firm and tender to palpation. No distention.  Musculoskeletal: No lower extremity tenderness nor edema. No gross deformities of extremities. Neurologic:  Normal speech and language. No gross focal neurologic deficits are appreciated.  Skin:  Skin is warm, dry and intact. No rash noted.  ____________________________________________   LABS (all labs ordered are listed, but only abnormal results are displayed)  Labs Reviewed  COMPREHENSIVE METABOLIC PANEL - Abnormal; Notable for the following components:      Result Value   Glucose, Bld 113 (*)    All other components within normal limits  CBC WITH DIFFERENTIAL/PLATELET - Abnormal; Notable for the following components:   WBC 11.9 (*)    RBC 3.96 (*)    MCV 106.1 (*)    MCH 35.1 (*)    Neutro Abs 10.0 (*)    Abs Immature Granulocytes 0.08 (*)    All other components within normal limits  I-STAT CG4 LACTIC ACID, ED  I-STAT CG4 LACTIC ACID, ED   ____________________________________________  RADIOLOGY  Ct Abdomen Pelvis W Contrast  Result Date: 06/20/2018 CLINICAL DATA:  100 mL isovue 300 given, tolerated well without complication or reaction Pt with umbilical hernia, MD in ED reduced hernia, pt states he felt immediate relief in pain Prior to reduction of hernia by ED MD, pt experienced pain, nausea, and vomiting. EXAM: CT ABDOMEN AND PELVIS WITH CONTRAST TECHNIQUE: Multidetector CT imaging of the abdomen and pelvis was performed using the standard protocol following bolus administration of intravenous contrast. CONTRAST:  ISOVUE-300 IOPAMIDOL (ISOVUE-300) INJECTION 61% COMPARISON:  None. FINDINGS: Lower chest: There is extensive atherosclerotic calcification of the coronary arteries. Heart size is normal. No pericardial effusion. Lung bases are unremarkable. Hepatobiliary: Calcified gallstone measures 9  millimeters. Otherwise the gallbladder is normal in appearance. The liver is homogeneous without focal mass. Pancreas: Unremarkable. No pancreatic ductal dilatation or surrounding inflammatory changes. Spleen: Normal in size without focal abnormality. Adrenals/Urinary Tract: The adrenal glands are normal in appearance. There is symmetric enhancement and excretion from both kidneys. A RIGHT LOWER pole cyst is 12 millimeters. There is no hydronephrosis. The ureters are unremarkable. Nonspecific perinephric stranding. The bladder and visualized portion of the urethra are normal. Stomach/Bowel: Moderate hiatal hernia. Stomach is otherwise normal in appearance. Small bowel loops are normal in appearance. There is no evidence for small bowel obstruction or abnormal wall thickening. There are numerous colonic diverticula but no acute diverticulitis. The appendix is well seen and has a normal appearance. Vascular/Lymphatic: There is atherosclerotic calcification of the abdominal aorta not associated with aneurysm. No retroperitoneal or mesenteric adenopathy. Reproductive: Prostate is unremarkable. Other: There is a 2.1 x 1.4 centimeter fat containing paraumbilical hernia with mild fat stranding, and consistent with the history of recently reduced umbilical hernia.  No evidence for fluid or bowel within the hernia sac. The hernia sac is immediately anterior to normal appearing small bowel loops. Within the RIGHT UPPER QUADRANT retroperitoneum, there is an oval circumscribed densely calcified mass which measures 1.4 x 1.5 centimeters. This is favored to represent a calcified lymph node and has benign features. Musculoskeletal: No acute or significant osseous findings. IMPRESSION: 1. Small fat containing paraumbilical hernia with mild fat stranding, consistent with recently reduced hernia. 2. No evidence for bowel obstruction or bowel infarction. 3.  Aortic atherosclerosis.  (ICD10-I70.0) 4. Coronary artery disease. 5. Moderate  hiatal hernia. 6. Cholelithiasis. 7. Significant colonic diverticulosis without acute diverticulitis. 8. Benign RIGHT UPPER QUADRANT calcification. Electronically Signed   By: Norva Pavlov M.D.   On: 06/20/2018 15:37    ____________________________________________   PROCEDURES  Procedure(s) performed:   Hernia reduction Date/Time: 06/20/2018 2:54 PM Performed by: Maia Plan, MD Authorized by: Maia Plan, MD  Consent: Verbal consent obtained. Risks and benefits: risks, benefits and alternatives were discussed Consent given by: patient Required items: required blood products, implants, devices, and special equipment available Patient identity confirmed: verbally with patient Local anesthesia used: no  Anesthesia: Local anesthesia used: no  Sedation: Patient sedated: no (Patient was given 1 mg of Dilaudid. )  Patient tolerance: Patient tolerated the procedure well with no immediate complications Comments: Ron Agee, steady pressure applied to the umbilical hernia. The hernia reduced with minimal discomfort. No return of hernia after reduction. Patient tolerated well.       ____________________________________________   INITIAL IMPRESSION / ASSESSMENT AND PLAN / ED COURSE  Pertinent labs & imaging results that were available during my care of the patient were reviewed by me and considered in my medical decision making (see chart for details).  Patient presents to the emergency department with periumbilical pain, nausea, vomiting.  Patient clinically has an umbilical hernia.  Plan for attempted reduction at bedside with pain since early this AM. No fever or signs of sepsis at this time. CT pending.   02:53 PM I was able to reduce the periumbilical hernia at bedside with Dilaudid for pain. Lactate normal. Pain improved.   Labs reviewed. No evidence of obstruction clinically. CT reviewed. Patient to follow up with general surgery as an outpatient.    ____________________________________________  FINAL CLINICAL IMPRESSION(S) / ED DIAGNOSES  Final diagnoses:  Umbilical hernia without obstruction and without gangrene     MEDICATIONS GIVEN DURING THIS VISIT:  Medications  sodium chloride 0.9 % bolus 500 mL ( Intravenous Stopped 06/20/18 1552)  HYDROmorphone (DILAUDID) injection 1 mg (1 mg Intravenous Given 06/20/18 1437)  ondansetron (ZOFRAN) injection 4 mg (4 mg Intravenous Given 06/20/18 1443)  iopamidol (ISOVUE-300) 61 % injection 100 mL (100 mLs Intravenous Contrast Given 06/20/18 1508)     Note:  This document was prepared using Dragon voice recognition software and may include unintentional dictation errors.  Alona Bene, MD Emergency Medicine    Nyleah Mcginnis, Arlyss Repress, MD 06/20/18 [phone]

## 2018-06-20 NOTE — ED Notes (Signed)
C/o  Umbilicus  Pain onset this am  States he has had a soft area there for several years and today it is hard  Vomited x 2

## 2018-06-20 NOTE — ED Triage Notes (Addendum)
Pt with abd pain at umbilicus, "bulging", n/v started 8am-entered triage vomiting-slow steady gait

## 2018-06-20 NOTE — ED Provider Notes (Signed)
Excepted from Dr. Jacqulyn Bath at signout.  Patient has had reduction of umbilical hernia.  And to follow-up on CT, if negative for acute findings and patient is comfortable without vomiting, plan for discharge.  Patient reassessed after return of CT scan.  No pain, no nausea or vomiting, patient is well in appearance alert and nontoxic with no respiratory distress. Physical Exam  BP (!) 162/105 (BP Location: Left Arm)   Pulse 78   Temp 98.7 F (37.1 C) (Oral)   Resp 18   Ht 5\' 10"  (1.778 m)   Wt 95.7 kg   SpO2 96%   BMI 30.28 kg/m   Physical Exam  ED Course/Procedures     Procedures  MDM  I have reviewed the results of CT scan.  She is stable for home management.  He is counseled on avoiding all increases in intra-abdominal pressure.  Follow-up plan with calling [facility].  Return precautions reviewed.       Arby Barrette, MD 06/20/18 [phone]

## 2018-06-20 NOTE — Discharge Instructions (Signed)
1.  Try to avoid any activities that resulted in increased pressure in the abdomen such as straining for bowel movement, squatting, lifting, forceful coughing. 2.  If you feel like your hernia pops out, try lying flat on your back and relaxing, putting constant pressure over it to reduce it.  If it does not go back in, if you are having persisting pain, if you develop vomiting, worsening pain or any concerning symptoms you must return to the emergency department. 3.  Take Tylenol for pain.  If you need, you may take hydrocodone sparingly.  Try to avoid constipation by taking a stool softener such as Colace twice daily, eating of fiber diet and staying hydrated.

## 2018-06-21 NOTE — Progress Notes (Signed)
Gregory Wheeler Date of Birth: Aug 12, 1941   History of Present Illness: Gregory Wheeler is seen for follow up of CAD and atrial fibrillation.  He has a history of coronary disease and is status post stenting of the LAD with overlapping Taxus stents in 2009. His last myoview in April 2015 showed mild apical thinning, normal EF 67%.  He  was seen in the ED in February 2016 for symptoms of left arm numbness and pain. This was worse with lifting his arm. Ecg was done and demonstrated tachycardia with no clear P waves and rate 113. He was treated with a muscle relaxant.  He was seen in June 2016 symptoms of fatigue and acute gout flair in his right knee bursa. He was noted to be in atrial fibrillation with RVR (and review of ECG from February showed the same). He had been drinking Etoh heavily. He was started on metoprolol and Xarelto.   He was scheduled for a DCCV on 03/03/15 but on arrival he was in NSR with rate 77. He was seen in April 2017 with symptoms of fatigue related to low BP. This was in the setting of taking some of his wife's lasix.  He was  switched to Coumadin due to the cost of Xarelto. He states he hates Coumadin and went back to taking Xarelto.   Since his last visit his wife has passed away.   He was seen in the ED on 06/20/18 with abdominal pain and had an umbilical hernia reduced.   On follow up today he is seen with his daughter. Still grieving about his wife. Has lost 5 lbs. He is mostly bothered by Neuropathy in his feet. Gabapentin did not help even when he tried higher doses. He has infrequent chest pain unchanged from before. No dyspnea or edema. Saw a podiatrist and they wanted to fit him for braces and orthotics.     Current Outpatient Medications on File Prior to Visit  Medication Sig Dispense Refill  . allopurinol (ZYLOPRIM) 300 MG tablet Take 1 tablet (300 mg total) by mouth daily. 90 tablet 3  . losartan (COZAAR) 25 MG tablet Take 1 tablet (25 mg total) by mouth daily. 90  tablet 3  . metoprolol succinate (TOPROL-XL) 50 MG 24 hr tablet Take 1 tablet (50 mg total) by mouth daily. Take with or immediately following a meal. 30 tablet 0  . rivaroxaban (XARELTO) 20 MG TABS tablet TAKE 1 TABLET BY MOUTH ONCE DAILY WITH SUPPER 90 tablet 1   No current facility-administered medications on file prior to visit.     No Known Allergies  Past Medical History:  Diagnosis Date  . Arthritis   . Atrial fibrillation (HCC) 01/31/2015  . Chronic gout   . Coronary artery disease 12/2007   STATUS POST STENTING OF THE LEFT ANTERIOR DESCENDING ARTERY  . Dyslipidemia   . Gout   . Hiatal hernia   . Hyperlipidemia   . Hypertension   . LBP (low back pain)   . Partial small bowel obstruction (HCC)   . RBBB (right bundle branch block)   . SBO (small bowel obstruction) (HCC)    PARTIAL  . Second degree AV block, Mobitz type I   . Urinary tract infection    Escherichia coli urinary tract infection    Past Surgical History:  Procedure Laterality Date  . CARDIAC CATHETERIZATION    . HERNIA REPAIR     INGUINAL  . ROTATOR CUFF REPAIR      Social  History   Tobacco Use  Smoking Status Former Smoker  . Last attempt to quit: 08/27/1990  . Years since quitting: 27.8  Smokeless Tobacco Never Used    Social History   Substance and Sexual Activity  Alcohol Use Yes   Comment: near daily    Family History  Problem Relation Age of Onset  . Emphysema Mother 64  . Heart attack Father 68    Review of Systems: As noted in HPI All other systems were reviewed and are negative.  Physical Exam: BP 120/70   Pulse 79   Ht 5\' 9"  (1.753 m)   Wt 206 lb 3.2 oz (93.5 kg)   BMI 30.45 kg/m  GENERAL:  Chronically ill appearing WM in NAD HEENT:  PERRL, EOMI, sclera are clear. Oropharynx is clear. NECK:  No jugular venous distention, carotid upstroke brisk and symmetric, no bruits, no thyromegaly or adenopathy LUNGS:  Clear to auscultation bilaterally CHEST:  Unremarkable HEART:   RRR,  PMI not displaced or sustained,S1 and S2 within normal limits, no S3, no S4: no clicks, no rubs, no murmurs ABD:  Soft, nontender. BS +, no masses or bruits. No hepatomegaly, no splenomegaly EXT:  2 + pulses throughout, no edema, no cyanosis no clubbing SKIN:  Warm and dry.  No rashes NEURO:  Alert and oriented x 3. Cranial nerves II through XII intact. PSYCH:  Cognitively intact        LABORATORY DATA: Lab Results  Component Value Date   WBC 11.9 (H) 06/20/2018   HGB 13.9 06/20/2018   HCT 42.0 06/20/2018   PLT 188 06/20/2018   GLUCOSE 113 (H) 06/20/2018   CHOL 176 06/10/2017   TRIG 125 06/10/2017   HDL 66 06/10/2017   LDLCALC 85 06/10/2017   ALT 22 06/20/2018   AST 22 06/20/2018   NA 136 06/20/2018   K 4.1 06/20/2018   CL 98 06/20/2018   CREATININE 0.98 06/20/2018   BUN 10 06/20/2018   CO2 28 06/20/2018   TSH 3.340 06/10/2017   INR 1.4 04/24/2016   Echo: 02/09/15  Study Conclusions  - Left ventricle: The cavity size was normal. There was mild focal basal hypertrophy of the septum. Systolic function was normal. The estimated ejection fraction was in the range of 60% to 65%. Wall motion was normal; there were no regional wall motion abnormalities. - Aortic valve: Trileaflet; normal thickness, mildly calcified leaflets. - Mitral valve: Calcified annulus. - Pulmonic valve: There was trivial regurgitation.  Ecg today shows NSR with RBBB. Rate 79. I have personally reviewed and interpreted this study.  Assessment / Plan: 1. Coronary disease status post stenting of the LAD in 2009 with Taxus stents. Nuclear stress test in April 2015 showed a small apical infarct with normal ejection fraction. He is minimally symptomatic with stable class 1-2 symptoms.  Continue with risk factor modification. Antiplatelet therapy stopped now that he is on Xarelto. Continue Toprol XL.   2. Atrial fibrillation- paroxysmal. In NSR today.   He is asymptomatic.  [place] vasc  score is 3. Recommend continued abstinence from alcohol. Avoid NSAIDs. Now on Xarelto.   3. Hyperlipidemia  on simvastatin. Labs look excellent.  4. History of Mobitz type 1 second degree AV block.   5. HTN controlled  6. Gout. Controlled. Continue allopurinol   7. Etoh abuse. Recommend total abstinence.   8. Obesity. He has lost weight  9. Peripheral neuropathy. Will check B 12 and TSH. I suspect this is related to his history of  Etoh use. No improvement with gabapentin. Will prescribe Lyrica 50 mg tid. Can also try Capsacin 0.075% OTC four times a day.    I will follow up in 6 months.

## 2018-07-01 ENCOUNTER — Encounter: Payer: Self-pay | Admitting: Cardiology

## 2018-07-01 ENCOUNTER — Ambulatory Visit (INDEPENDENT_AMBULATORY_CARE_PROVIDER_SITE_OTHER): Payer: Medicare Other | Admitting: Cardiology

## 2018-07-01 VITALS — BP 120/70 | HR 79 | Ht 69.0 in | Wt 206.2 lb

## 2018-07-01 DIAGNOSIS — I4510 Unspecified right bundle-branch block: Secondary | ICD-10-CM | POA: Diagnosis not present

## 2018-07-01 DIAGNOSIS — G622 Polyneuropathy due to other toxic agents: Secondary | ICD-10-CM | POA: Diagnosis not present

## 2018-07-01 DIAGNOSIS — I10 Essential (primary) hypertension: Secondary | ICD-10-CM

## 2018-07-01 DIAGNOSIS — I2510 Atherosclerotic heart disease of native coronary artery without angina pectoris: Secondary | ICD-10-CM | POA: Diagnosis not present

## 2018-07-01 DIAGNOSIS — I25118 Atherosclerotic heart disease of native coronary artery with other forms of angina pectoris: Secondary | ICD-10-CM

## 2018-07-01 DIAGNOSIS — I4819 Other persistent atrial fibrillation: Secondary | ICD-10-CM | POA: Diagnosis not present

## 2018-07-01 DIAGNOSIS — D519 Vitamin B12 deficiency anemia, unspecified: Secondary | ICD-10-CM | POA: Diagnosis not present

## 2018-07-01 DIAGNOSIS — G9009 Other idiopathic peripheral autonomic neuropathy: Secondary | ICD-10-CM | POA: Diagnosis not present

## 2018-07-01 LAB — TSH: TSH: 4.3 u[IU]/mL (ref 0.450–4.500)

## 2018-07-01 LAB — VITAMIN B12: VITAMIN B 12: 426 pg/mL (ref 232–1245)

## 2018-07-01 MED ORDER — PREGABALIN 50 MG PO CAPS: 50.0000 mg | ORAL_CAPSULE | Freq: Three times a day (TID) | ORAL | 6 refills | Status: DC

## 2018-07-01 NOTE — Patient Instructions (Signed)
You can try Lyrica 50 mg three times daily for neuropathy  Over the counter you can try Capsaicin cream 0.075% four times daily  Continue your other therapy

## 2018-07-01 NOTE — Addendum Note (Signed)
Addended by: Neoma Laming on: 07/01/2018 10:04 AM   Modules accepted: Orders

## 2018-07-03 ENCOUNTER — Telehealth: Payer: Self-pay | Admitting: Cardiology

## 2018-07-03 MED ORDER — ALPRAZOLAM 0.25 MG PO TABS: 0.2500 mg | ORAL_TABLET | Freq: Three times a day (TID) | ORAL | 0 refills | Status: DC | PRN

## 2018-07-03 NOTE — Telephone Encounter (Signed)
Spoke with pt's daughter who states they are requesting a prescription for Xanax. Daughter states Dr. Swaziland had prescribed it for pt a couple of years ago. Daughter informed that typically they defer it to pcp but will route to MD for recommendation. Verbalized understanding.

## 2018-07-03 NOTE — Telephone Encounter (Signed)
New message   Pt c/o medication issue:  1. Name of Medication: altrazolan (Zanex)  2. How are you currently taking this medication (dosage and times per day)? Patient takes at night  3. Are you having a reaction (difficulty breathing--STAT)?n/a  4. What is your medication issue? Patient's daughter states that her dan needs a prescription for this medication.

## 2018-07-03 NOTE — Telephone Encounter (Signed)
Spoke to patient's daughter Xanax phoned in to pharmacy.

## 2018-07-03 NOTE — Telephone Encounter (Signed)
OK to prescribe Xanax 0.25 mg to take prn anxiety. # 50.  Peter Swaziland MD, [facility]

## 2018-08-11 ENCOUNTER — Telehealth: Payer: Self-pay | Admitting: Cardiology

## 2018-08-11 NOTE — Telephone Encounter (Signed)
Patient calling the office for samples of medication: ° ° °1.  What medication and dosage are you requesting samples for? Xarelto °2.  Are you currently out of this medication? Yes ° ° ° °

## 2018-08-11 NOTE — Telephone Encounter (Signed)
Returned call to patient's daughter Christella HartiganRamona office out of Xarelto samples.

## 2018-08-12 NOTE — Telephone Encounter (Signed)
Spoke to patient's daughter [name] Xarelto 20 mg samples left at [facility] office front desk. 

## 2018-09-01 ENCOUNTER — Telehealth: Payer: Self-pay | Admitting: Cardiology

## 2018-09-01 NOTE — Telephone Encounter (Signed)
Patient calling the office for samples of medication:   1.  What medication and dosage are you requesting samples for?Xarelto 2.  Are you currently out of this medication?  Daughter says she needs to talk to you also about his insurance

## 2018-09-01 NOTE — Telephone Encounter (Signed)
Returned call to patient's daughter Christella Hartigan.Xarelto 20 mg samples left at [facility] office front desk along with a patient assistance application to be completed and returned.

## 2018-09-24 ENCOUNTER — Telehealth: Payer: Self-pay | Admitting: Cardiology

## 2018-09-24 DIAGNOSIS — I4819 Other persistent atrial fibrillation: Secondary | ICD-10-CM

## 2018-09-24 MED ORDER — RIVAROXABAN 20 MG PO TABS: ORAL_TABLET | ORAL | 0 refills | Status: DC

## 2018-09-24 NOTE — Telephone Encounter (Signed)
SAMPLES ARE AVAILABLE FOR PICK UP  (3 BOTTLES) VERBALIZED UNDERSTANDING

## 2018-09-24 NOTE — Telephone Encounter (Signed)
New Message  Patient calling the office for samples of medication:   1.  What medication and dosage are you requesting samples for?   rivaroxaban (XARELTO) 20 MG TABS tablet     2.  Are you currently out of this medication?  Just about 2 days left

## 2018-10-20 ENCOUNTER — Telehealth: Payer: Self-pay | Admitting: Cardiology

## 2018-10-20 NOTE — Telephone Encounter (Signed)
New Message   Patient calling the office for samples of medication:   1.  What medication and dosage are you requesting samples for? Xarelto 20mg   2.  Are you currently out of this medication?   Patient has 1 or 2 more

## 2018-10-20 NOTE — Telephone Encounter (Signed)
Xarelto 20mg  samples given to the pt  #28 (4 bottles) Lot [id number] EXP 11-21

## 2018-11-04 DIAGNOSIS — F5104 Psychophysiologic insomnia: Secondary | ICD-10-CM | POA: Diagnosis not present

## 2018-11-04 DIAGNOSIS — L209 Atopic dermatitis, unspecified: Secondary | ICD-10-CM | POA: Diagnosis not present

## 2018-11-04 DIAGNOSIS — L03116 Cellulitis of left lower limb: Secondary | ICD-10-CM | POA: Diagnosis not present

## 2018-11-06 ENCOUNTER — Telehealth: Payer: Self-pay | Admitting: Cardiology

## 2018-11-06 NOTE — Telephone Encounter (Signed)
New Message   Patient calling the office for samples of medication:   1.  What medication and dosage are you requesting samples for? Xareto   2.  Are you currently out of this medication? Almost

## 2018-11-06 NOTE — Telephone Encounter (Signed)
Patient aware samples are at the front desk for pick up  

## 2018-12-01 ENCOUNTER — Other Ambulatory Visit: Payer: Self-pay

## 2018-12-01 MED ORDER — ALLOPURINOL 300 MG PO TABS: 300.0000 mg | ORAL_TABLET | Freq: Every day | ORAL | 3 refills | Status: DC

## 2018-12-30 DIAGNOSIS — Z0000 Encounter for general adult medical examination without abnormal findings: Secondary | ICD-10-CM | POA: Diagnosis not present

## 2018-12-30 DIAGNOSIS — F331 Major depressive disorder, recurrent, moderate: Secondary | ICD-10-CM | POA: Diagnosis not present

## 2018-12-30 DIAGNOSIS — Z7901 Long term (current) use of anticoagulants: Secondary | ICD-10-CM | POA: Diagnosis not present

## 2018-12-30 DIAGNOSIS — F5101 Primary insomnia: Secondary | ICD-10-CM | POA: Diagnosis not present

## 2018-12-30 DIAGNOSIS — E782 Mixed hyperlipidemia: Secondary | ICD-10-CM | POA: Diagnosis not present

## 2018-12-30 DIAGNOSIS — M1A09X0 Idiopathic chronic gout, multiple sites, without tophus (tophi): Secondary | ICD-10-CM | POA: Diagnosis not present

## 2018-12-30 DIAGNOSIS — I2510 Atherosclerotic heart disease of native coronary artery without angina pectoris: Secondary | ICD-10-CM | POA: Diagnosis not present

## 2018-12-30 DIAGNOSIS — M25512 Pain in left shoulder: Secondary | ICD-10-CM | POA: Diagnosis not present

## 2018-12-30 DIAGNOSIS — M8949 Other hypertrophic osteoarthropathy, multiple sites: Secondary | ICD-10-CM | POA: Diagnosis not present

## 2018-12-30 DIAGNOSIS — Z125 Encounter for screening for malignant neoplasm of prostate: Secondary | ICD-10-CM | POA: Diagnosis not present

## 2018-12-30 DIAGNOSIS — D649 Anemia, unspecified: Secondary | ICD-10-CM | POA: Diagnosis not present

## 2018-12-30 DIAGNOSIS — H9113 Presbycusis, bilateral: Secondary | ICD-10-CM | POA: Diagnosis not present

## 2018-12-30 DIAGNOSIS — H259 Unspecified age-related cataract: Secondary | ICD-10-CM | POA: Diagnosis not present

## 2019-01-02 ENCOUNTER — Telehealth: Payer: Medicare Other | Admitting: Cardiology

## 2019-01-27 DIAGNOSIS — F331 Major depressive disorder, recurrent, moderate: Secondary | ICD-10-CM | POA: Diagnosis not present

## 2019-01-27 DIAGNOSIS — F5101 Primary insomnia: Secondary | ICD-10-CM | POA: Diagnosis not present

## 2019-01-27 DIAGNOSIS — M542 Cervicalgia: Secondary | ICD-10-CM | POA: Diagnosis not present

## 2019-02-04 ENCOUNTER — Telehealth: Payer: Self-pay | Admitting: Cardiology

## 2019-02-04 NOTE — Telephone Encounter (Signed)
Returned call to patient he stated he has been having sob for the past 2 to 3 months.He cannot walk much distance without being sob. Stated seems to be getting worse.He also has been having pain in shoulders.No chest pain.Stated PCP gave him a shot in shoulders last week.Stated it has helped alittle but he still has pain.Appointment scheduled at office with Fabian Sharp PA 02/06/19 at 3:15 pm.

## 2019-02-04 NOTE — Telephone Encounter (Signed)
New Message    Pt c/o Shortness Of Breath: STAT if SOB developed within the last 24 hours or pt is noticeably SOB on the phone  1. Are you currently SOB (can you hear that pt is SOB on the phone)? NO  2. How long have you been experiencing SOB? For about a month  3. Are you SOB when sitting or when up moving around? When moving around  4. Are you currently experiencing any other symptoms? Fatigue

## 2019-02-06 ENCOUNTER — Encounter: Payer: Self-pay | Admitting: Physician Assistant

## 2019-02-06 ENCOUNTER — Ambulatory Visit (INDEPENDENT_AMBULATORY_CARE_PROVIDER_SITE_OTHER): Payer: Medicare Other | Admitting: Physician Assistant

## 2019-02-06 ENCOUNTER — Other Ambulatory Visit: Payer: Self-pay

## 2019-02-06 VITALS — BP 157/78 | HR 63 | Temp 97.3°F | Ht 70.0 in | Wt 226.6 lb

## 2019-02-06 DIAGNOSIS — R079 Chest pain, unspecified: Secondary | ICD-10-CM | POA: Diagnosis not present

## 2019-02-06 DIAGNOSIS — F1010 Alcohol abuse, uncomplicated: Secondary | ICD-10-CM

## 2019-02-06 DIAGNOSIS — I2510 Atherosclerotic heart disease of native coronary artery without angina pectoris: Secondary | ICD-10-CM | POA: Diagnosis not present

## 2019-02-06 DIAGNOSIS — R0609 Other forms of dyspnea: Secondary | ICD-10-CM | POA: Diagnosis not present

## 2019-02-06 DIAGNOSIS — I4510 Unspecified right bundle-branch block: Secondary | ICD-10-CM

## 2019-02-06 DIAGNOSIS — I480 Paroxysmal atrial fibrillation: Secondary | ICD-10-CM

## 2019-02-06 DIAGNOSIS — E782 Mixed hyperlipidemia: Secondary | ICD-10-CM

## 2019-02-06 DIAGNOSIS — D649 Anemia, unspecified: Secondary | ICD-10-CM

## 2019-02-06 DIAGNOSIS — I10 Essential (primary) hypertension: Secondary | ICD-10-CM

## 2019-02-06 DIAGNOSIS — E538 Deficiency of other specified B group vitamins: Secondary | ICD-10-CM

## 2019-02-06 DIAGNOSIS — I440 Atrioventricular block, first degree: Secondary | ICD-10-CM

## 2019-02-06 MED ORDER — POLYSACCHARIDE IRON COMPLEX 150 MG PO CAPS: 150.0000 mg | ORAL_CAPSULE | Freq: Every day | ORAL | 3 refills | Status: DC

## 2019-02-06 NOTE — Patient Instructions (Addendum)
Medication Instructions:  START IRON TAKE 1 TABLET ONCE A DAY  If you need a refill on your cardiac medications before your next appointment, please call your pharmacy.   Lab work: Your physician recommends that you return for lab work in: TODAY-BMET, BNP, CBC  If you have labs (blood work) drawn today and your tests are completely normal, you will receive your results only by: Marland Kitchen MyChart Message (if you have MyChart) OR . A paper copy in the mail If you have any lab test that is abnormal or we need to change your treatment, we will call you to review the results.  Testing/Procedures: Your physician has requested that you have an echocardiogram. Echocardiography is a painless test that uses sound waves to create images of your heart. It provides your doctor with information about the size and shape of your heart and how well your heart's chambers and valves are working. This procedure takes approximately one hour. There are no restrictions for this procedure. Gregory Wheeler has requested that you have a lexiscan myoview. For further information please visit [email or web address]. Please follow instruction sheet, as given. NO MEDS TO HOLD PRIOR TO PROCEDURE  Follow-Up: At [facility], you and your health needs are our priority.  As part of our continuing mission to provide you with exceptional heart care, we have created designated Provider Care Teams.  These Care Teams include your primary Cardiologist (physician) and Advanced Practice Providers (APPs -  Physician Assistants and Nurse Practitioners) who all work together to provide you with the care you need, when you need it. . FOLLOW UP AS SCHEDULED WITH DR Martinique   Any Other Special Instructions Will Be Listed Below (If Applicable). CHECK YOUR BLOOD PRESSURE 2 HOURS AFTER MORNING MEDS

## 2019-02-06 NOTE — Progress Notes (Signed)
Cardiology Office Note:    Date:  02/06/2019   ID:  Gregory Wheeler, DOB 1940/09/01, MRN [id number]  PCP:  Jacqualine MauEdelen, Kevin B, NP  Cardiologist:  Peter SwazilandJordan, MD   Referring MD: Barbie BannerWilson, Fred H, MD   Chief Complaint  Patient presents with  . Shortness of Breath    DOE, weakness, fatigue    History of Present Illness:    Gregory Wheeler is a 78 y.o. male with a hx of second degree AV block, RBBB, Hypertension/hypotension, CAD, chronic atrial fibrillation, HLD, and alcohol abuse. He is s/p overlapping taxus stents in the LAD in 2009. Myoview in 2015 with apical thinning and normal EF. EKG in Feb 2016 reviewed and showed atrial fibrillation. He was seen again 01/2015 and was in Afib RVR after drinking alcohol heavily. He was started on lopressor and xarelto for stroke prophylaxis. He was scheduled for DCCV 03/03/15, but was in NSR on presentation. He was switched to coumadin for cost, but he didn't like coumadin and switched back to xarelto. He has been getting samples from the office regularly. He was last seen in clinic with Dr. SwazilandJordan on 07/01/18. He was grieving his wife's passing at that time and his biggest complaint was neuropathy in his feet.   He presents today for complaints of fatigue, DOE, and tinges of chest pain. His main complaint is feeling weak with activity and DOE. He does report very brief chest "twinges/tingles" that last seconds and only occur once every couple of weeks. Of note, he saw his PCP in early May and was found to be anemic and Folate was less than 2. He was started on folic acid. He also has cut back drinking alcohol in the month of May. He is still grieving his wife's passing, they were married for 58 years.    Past Medical History:  Diagnosis Date  . Arthritis   . Atrial fibrillation (HCC) 01/31/2015  . Chronic gout   . Coronary artery disease 12/2007   STATUS POST STENTING OF THE LEFT ANTERIOR DESCENDING ARTERY  . Dyslipidemia   . Gout   . Hiatal hernia   .  Hyperlipidemia   . Hypertension   . LBP (low back pain)   . Partial small bowel obstruction (HCC)   . RBBB (right bundle branch block)   . SBO (small bowel obstruction) (HCC)    PARTIAL  . Second degree AV block, Mobitz type I   . Urinary tract infection    Escherichia coli urinary tract infection    Past Surgical History:  Procedure Laterality Date  . CARDIAC CATHETERIZATION    . HERNIA REPAIR     INGUINAL  . ROTATOR CUFF REPAIR      Current Medications: Current Meds  Medication Sig  . allopurinol (ZYLOPRIM) 300 MG tablet Take 1 tablet (300 mg total) by mouth daily.  Marland Kitchen. ALPRAZolam (XANAX) 0.25 MG tablet Take 1 tablet (0.25 mg total) by mouth 3 (three) times daily as needed for anxiety.  . folic acid (FOLVITE) 1 MG tablet Take 2 tablets by mouth daily.  Marland Kitchen. losartan (COZAAR) 25 MG tablet Take 1 tablet (25 mg total) by mouth daily.  . metoprolol succinate (TOPROL-XL) 50 MG 24 hr tablet Take 1 tablet (50 mg total) by mouth daily. Take with or immediately following a meal.  . rivaroxaban (XARELTO) 20 MG TABS tablet TAKE 1 TABLET BY MOUTH ONCE DAILY WITH SUPPER     Allergies:   Patient has no known allergies.   Social  History   Socioeconomic History  . Marital status: Married    Spouse name: Not on file  . Number of children: 3  . Years of education: Not on file  . Highest education level: Not on file  Occupational History  . Occupation: used Teacher, early years/pre  Social Needs  . Financial resource strain: Not on file  . Food insecurity    Worry: Not on file    Inability: Not on file  . Transportation needs    Medical: Not on file    Non-medical: Not on file  Tobacco Use  . Smoking status: Former Smoker    Quit date: 08/27/1990    Years since quitting: 28.4  . Smokeless tobacco: Never Used  Substance and Sexual Activity  . Alcohol use: Yes    Comment: near daily  . Drug use: No  . Sexual activity: Not on file  Lifestyle  . Physical activity    Days per week: Not on file     Minutes per session: Not on file  . Stress: Not on file  Relationships  . Social Herbalist on phone: Not on file    Gets together: Not on file    Attends religious service: Not on file    Active member of club or organization: Not on file    Attends meetings of clubs or organizations: Not on file    Relationship status: Not on file  Other Topics Concern  . Not on file  Social History Narrative  . Not on file     Family History: The patient's family history includes Emphysema (age of onset: 58) in his mother; Heart attack (age of onset: 74) in his father.  ROS:   Please see the history of present illness.     All other systems reviewed and are negative.  EKGs/Labs/Other Studies Reviewed:    The following studies were reviewed today:  Echo 02/09/15: Study Conclusions - Left ventricle: The cavity size was normal. There was mild focal   basal hypertrophy of the septum. Systolic function was normal.   The estimated ejection fraction was in the range of 60% to 65%.   Wall motion was normal; there were no regional wall motion   abnormalities. - Aortic valve: Trileaflet; normal thickness, mildly calcified   leaflets. - Mitral valve: Calcified annulus. - Pulmonic valve: There was trivial regurgitation.   EKG:  EKG is ordered today.  The ekg ordered today demonstrates sinus rhythm, heart rate 63 bpm, first degree heart block (PR 214), RBBB (old), nonspecific ST changes that appear similar to prior  Recent Labs: 06/20/2018: ALT 22; BUN 10; Creatinine, Ser 0.98; Hemoglobin 13.9; Platelets 188; Potassium 4.1; Sodium 136 07/01/2018: TSH 4.300  Recent Lipid Panel    Component Value Date/Time   CHOL 176 06/10/2017 0839   TRIG 125 06/10/2017 0839   HDL 66 06/10/2017 0839   CHOLHDL 4.8 04/16/2016 1125   VLDL 44 (H) 04/16/2016 1125   LDLCALC 85 06/10/2017 0839    Physical Exam:    VS:  BP (!) 157/78   Pulse 63   Temp (!) 97.3 F (36.3 C)   Ht 5\' 10"  (1.778 m)    Wt 226 lb 9.6 oz (102.8 kg)   SpO2 97%   BMI 32.51 kg/m     Wt Readings from Last 3 Encounters:  02/06/19 226 lb 9.6 oz (102.8 kg)  07/01/18 206 lb 3.2 oz (93.5 kg)  06/20/18 211 lb (95.7 kg)  GEN: Well nourished, well developed elderly male in no acute distress HEENT: Normal NECK: No JVD; No carotid bruits CARDIAC: RRR, no murmurs, rubs, gallops RESPIRATORY:  Clear to auscultation without rales, wheezing or rhonchi  ABDOMEN: Soft, non-tender, non-distended MUSCULOSKELETAL:  Right lower extremity edema, normal left LE - not new; No deformity  SKIN: Warm and dry NEUROLOGIC:  Alert and oriented x 3 PSYCHIATRIC:  Normal affect   ASSESSMENT:    1. Dyspnea on exertion   2. Chest pain in adult   3. Coronary artery disease involving native coronary artery of native heart without angina pectoris   4. Anemia, unspecified type   5. Folate deficiency   6. Paroxysmal atrial fibrillation (HCC)   7. RBBB (right bundle branch block)   8. First degree heart block   9. Alcohol abuse   10. Essential hypertension   11. Mixed hyperlipidemia    PLAN:    In order of problems listed above:  Dyspnea on exertion Chest pain in adult  Coronary artery disease Anemia, unspecified type  Folate deficiency - overlapping stents in LAD (2009), normal myoview in 2015 Pt complains of weakness, exertional fatigue, and dyspnea on exertion. He describes atypical chest pain. He walks daily and denies chest pain with walking. His anemia (Hb 9) and newly diagnosed folate deficiency could be contributing to these symptoms. However, he has known disease. Will check labs (CBC, BMP, BNP), obtain echocardiogram and nuclear stress test. I also started him on iron 150 mg daily with a stool softener.  - No ASA in the setting of xarelto. - continue lopressor and losartan   Paroxysmal atrial fibrillation (HCC)  RBBB (right bundle branch block)  First degree heart block  Pt is in sinus rhythm today. RBBB is old.  PR prolongation is a bit worse today.  Continue lopressor and xarelto.  - This patients CHA2DS2-VASc Score and unadjusted Ischemic Stroke Rate (% per year) is equal to 4.8 % stroke rate/year from a score of 4 (HTN, CAD, 2age)   Alcohol abuse He is cutting back on alcohol drinking.   Essential hypertension BP is elevated today, but he states that at his recent PCP visit it was normal. He claims white coat syndrome. I will hold off on medication changes today. I asked him to take some home BP readings and bring them next month.   Mixed hyperlipidemia Unclear why he is not on a statin. Needs fasting lipids at next follow up.    Labs today. Echo and myoview. Follow up with Dr. SwazilandJordan in 1 months as scheduled.    Medication Adjustments/Labs and Tests Ordered: Current medicines are reviewed at length with the patient today.  Concerns regarding medicines are outlined above.  Orders Placed This Encounter  Procedures  . Basic metabolic panel  . Pro b natriuretic peptide (BNP)[facility])  . CBC with Differential  . MYOCARDIAL PERFUSION IMAGING  . ECHOCARDIOGRAM COMPLETE   Meds ordered this encounter  Medications  . iron polysaccharides (NIFEREX) 150 MG capsule    Sig: Take 1 capsule (150 mg total) by mouth daily.    Dispense:  90 capsule    Refill:  3    Signed, Marcelino Dusterngela Nicole Duke, [place]  02/06/2019 4:50 PM    [facility]

## 2019-02-07 LAB — CBC WITH DIFFERENTIAL/PLATELET
Basophils Absolute: 0.1 10*3/uL (ref 0.0–0.2)
Basos: 0 %
EOS (ABSOLUTE): 0.1 10*3/uL (ref 0.0–0.4)
Eos: 1 %
Hematocrit: 28.2 % — ABNORMAL LOW (ref 37.5–51.0)
Hemoglobin: 9.4 g/dL — ABNORMAL LOW (ref 13.0–17.7)
Immature Grans (Abs): 0.1 10*3/uL (ref 0.0–0.1)
Immature Granulocytes: 1 %
Lymphocytes Absolute: 1.9 10*3/uL (ref 0.7–3.1)
Lymphs: 11 %
MCH: 28.8 pg (ref 26.6–33.0)
MCHC: 33.3 g/dL (ref 31.5–35.7)
MCV: 87 fL (ref 79–97)
Monocytes Absolute: 1.7 10*3/uL — ABNORMAL HIGH (ref 0.1–0.9)
Monocytes: 10 %
Neutrophils Absolute: 12.6 10*3/uL — ABNORMAL HIGH (ref 1.4–7.0)
Neutrophils: 77 %
Platelets: 364 10*3/uL (ref 150–450)
RBC: 3.26 x10E6/uL — ABNORMAL LOW (ref 4.14–5.80)
RDW: 14.8 % (ref 11.6–15.4)
WBC: 16.4 10*3/uL — ABNORMAL HIGH (ref 3.4–10.8)

## 2019-02-07 LAB — BASIC METABOLIC PANEL
BUN/Creatinine Ratio: 17 (ref 10–24)
BUN: 17 mg/dL (ref 8–27)
CO2: 21 mmol/L (ref 20–29)
Calcium: 10.3 mg/dL — ABNORMAL HIGH (ref 8.6–10.2)
Chloride: 94 mmol/L — ABNORMAL LOW (ref 96–106)
Creatinine, Ser: 1.03 mg/dL (ref 0.76–1.27)
GFR calc Af Amer: 81 mL/min/{1.73_m2} (ref >59)
GFR calc non Af Amer: 70 mL/min/{1.73_m2} (ref >59)
Glucose: 85 mg/dL (ref 65–99)
Potassium: 4.1 mmol/L (ref 3.5–5.2)
Sodium: 131 mmol/L — ABNORMAL LOW (ref 134–144)

## 2019-02-07 LAB — PRO B NATRIURETIC PEPTIDE: NT-Pro BNP: 242 pg/mL (ref 0–486)

## 2019-02-09 NOTE — Addendum Note (Signed)
Addended by: Crissie Reese on: 02/09/2019 02:20 PM   Modules accepted: Orders

## 2019-02-11 ENCOUNTER — Telehealth (HOSPITAL_COMMUNITY): Payer: Self-pay | Admitting: *Deleted

## 2019-02-11 NOTE — Telephone Encounter (Signed)
Close encounter 

## 2019-02-12 ENCOUNTER — Ambulatory Visit (HOSPITAL_COMMUNITY)
Admission: RE | Admit: 2019-02-12 | Discharge: 2019-02-12 | Disposition: A | Discharge status: Home / Self Care | Payer: Medicare Other | Source: Ambulatory Visit | Attending: Internal Medicine | Admitting: Internal Medicine

## 2019-02-12 ENCOUNTER — Other Ambulatory Visit: Payer: Self-pay

## 2019-02-12 DIAGNOSIS — R079 Chest pain, unspecified: Secondary | ICD-10-CM | POA: Diagnosis not present

## 2019-02-12 DIAGNOSIS — R0609 Other forms of dyspnea: Secondary | ICD-10-CM | POA: Insufficient documentation

## 2019-02-12 LAB — MYOCARDIAL PERFUSION IMAGING
LV dias vol: 128 mL (ref 62–150)
LV sys vol: 51 mL
Peak HR: 76 {beats}/min
Rest HR: 60 {beats}/min
SDS: 0
SRS: 11
SSS: 11
TID: 1.09

## 2019-02-12 MED ORDER — TECHNETIUM TC 99M TETROFOSMIN IV KIT
10.3000 | PACK | Freq: Once | INTRAVENOUS | Status: AC | PRN
  Administered 2019-02-12: 10.3 via INTRAVENOUS
  Filled 2019-02-12: qty 11

## 2019-02-12 MED ORDER — REGADENOSON 0.4 MG/5ML IV SOLN
0.4000 mg | Freq: Once | INTRAVENOUS | Status: AC
  Administered 2019-02-12: 0.4 mg via INTRAVENOUS

## 2019-02-12 MED ORDER — TECHNETIUM TC 99M TETROFOSMIN IV KIT
28.6000 | PACK | Freq: Once | INTRAVENOUS | Status: AC | PRN
  Administered 2019-02-12: 28.6 via INTRAVENOUS
  Filled 2019-02-12: qty 29

## 2019-02-16 ENCOUNTER — Encounter: Payer: Self-pay | Admitting: Physician Assistant

## 2019-02-16 ENCOUNTER — Telehealth (HOSPITAL_COMMUNITY): Payer: Self-pay

## 2019-02-16 NOTE — Telephone Encounter (Signed)

## 2019-02-17 ENCOUNTER — Ambulatory Visit (HOSPITAL_COMMUNITY): Payer: Medicare Other | Attending: Internal Medicine

## 2019-02-17 ENCOUNTER — Other Ambulatory Visit: Payer: Self-pay

## 2019-02-17 DIAGNOSIS — R079 Chest pain, unspecified: Secondary | ICD-10-CM | POA: Diagnosis not present

## 2019-02-17 DIAGNOSIS — R0609 Other forms of dyspnea: Secondary | ICD-10-CM | POA: Diagnosis not present

## 2019-02-23 ENCOUNTER — Encounter: Payer: Self-pay | Admitting: Cardiology

## 2019-02-23 ENCOUNTER — Other Ambulatory Visit: Payer: Self-pay | Admitting: Cardiology

## 2019-02-23 NOTE — Telephone Encounter (Signed)
error 

## 2019-02-23 NOTE — Telephone Encounter (Signed)
°*  STAT* If patient is at the pharmacy, call can be transferred to refill team.   1. Which medications need to be refilled? (please list name of each medication and dose if known) losartan (COZAAR) 25 MG tablet metoprolol succinate (TOPROL-XL) 50 MG 24 hr tablet  2. Which pharmacy/location (including street and city if local pharmacy) is medication to be sent to? [id number] - [place], [place] - [address]  3. Do they need a 30 day or 90 day supply? 90 day

## 2019-03-05 NOTE — Progress Notes (Signed)
Gregory Wheeler Date of Birth: March 05, 1941   History of Present Illness: Mr. Burnett ShengHedrick is seen for follow up of CAD and atrial fibrillation.  He has a history of coronary disease and is status post stenting of the LAD with overlapping Taxus stents in 2009. His last myoview in April 2015 showed mild apical thinning, normal EF 67%.  He  was seen in the ED in February 2016 for symptoms of left arm numbness and pain. This was worse with lifting his arm. Ecg was done and demonstrated tachycardia with no clear P waves and rate 113.   He was seen in June 2016 symptoms of fatigue and acute gout flair in his right knee bursa. He was noted to be in atrial fibrillation with RVR (and review of ECG from February showed the same). He had been drinking Etoh heavily. He was started on metoprolol and Xarelto.   He was scheduled for a DCCV on 03/03/15 but on arrival he was in NSR with rate 77. He was seen in April 2017 with symptoms of fatigue related to low BP. This was in the setting of taking some of his wife's lasix.  He was  switched to Coumadin due to the cost of Xarelto. He states he hates Coumadin and went back to taking Xarelto.   He was seen in the ED on 06/20/18 with abdominal pain and had an umbilical hernia reduced.   He was seen one month ago with symptoms of increased DOE and fatigue. Was noted to be anemic with Hgb 9. Low folate. Normal BNP. Echo was normal. Myoview was low risk without significant ischemia and unchanged from prior studies. He was started on iron therapy  He reports his sluggishness has improved. No dyspnea just wears out easily. Was having a lot of shoulder problems but had injection with primary care and this helped. Not eating well. Drinking occasional beer. Quit drinking completely for 2 months. Denies any change in bowel habits. Does not use NSAIDs.      Current Outpatient Medications on File Prior to Visit  Medication Sig Dispense Refill  . allopurinol (ZYLOPRIM) 300 MG tablet  Take 1 tablet (300 mg total) by mouth daily. 90 tablet 3  . ALPRAZolam (XANAX) 0.25 MG tablet Take 1 tablet (0.25 mg total) by mouth 3 (three) times daily as needed for anxiety. 50 tablet 0  . escitalopram (LEXAPRO) 10 MG tablet Take 1 tablet by mouth daily.    . folic acid (FOLVITE) 1 MG tablet Take 2 tablets by mouth daily.    . iron polysaccharides (NIFEREX) 150 MG capsule Take 1 capsule (150 mg total) by mouth daily. 90 capsule 3  . losartan (COZAAR) 25 MG tablet TAKE 1 TABLET EVERY DAY 90 tablet 1  . metoprolol succinate (TOPROL-XL) 50 MG 24 hr tablet TAKE 1 TABLET DAILY. TAKE WITH OR IMMEDIATELY FOLLOWING A MEAL. 90 tablet 1  . rivaroxaban (XARELTO) 20 MG TABS tablet TAKE 1 TABLET BY MOUTH ONCE DAILY WITH SUPPER 21 tablet 0   No current facility-administered medications on file prior to visit.     No Known Allergies  Past Medical History:  Diagnosis Date  . Arthritis   . Atrial fibrillation (HCC) 01/31/2015  . Chronic gout   . Coronary artery disease 12/2007   STATUS POST STENTING OF THE LEFT ANTERIOR DESCENDING ARTERY  . Dyslipidemia   . Gout   . Hiatal hernia   . Hyperlipidemia   . Hypertension   . LBP (low back pain)   .  Partial small bowel obstruction ([place])   . RBBB (right bundle branch block)   . SBO (small bowel obstruction) (HCC)    PARTIAL  . Second degree AV block, Mobitz type I   . Urinary tract infection    Escherichia coli urinary tract infection    Past Surgical History:  Procedure Laterality Date  . CARDIAC CATHETERIZATION    . HERNIA REPAIR     INGUINAL  . ROTATOR CUFF REPAIR      Social History   Tobacco Use  Smoking Status Former Smoker  . Quit date: 08/27/1990  . Years since quitting: 28.5  Smokeless Tobacco Never Used    Social History   Substance and Sexual Activity  Alcohol Use Yes   Comment: near daily    Family History  Problem Relation Age of Onset  . Emphysema Mother 27  . Heart attack Father 65    Review of Systems: As  noted in HPI All other systems were reviewed and are negative.  Physical Exam: BP 140/84 (BP Location: Left Arm, Patient Position: Sitting, Cuff Size: Normal)   Pulse 74   Temp (!) 97 F (36.1 C)   Ht 5\' 10"  (1.778 m)   Wt 224 lb (101.6 kg)   BMI 32.14 kg/m  GENERAL:  Chronically ill appearing, overweight WM in NAD HEENT:  PERRL, EOMI, sclera are clear. Oropharynx is clear. NECK:  No jugular venous distention, carotid upstroke brisk and symmetric, no bruits, no thyromegaly or adenopathy LUNGS:  Clear to auscultation bilaterally CHEST:  Unremarkable HEART:  RRR,  PMI not displaced or sustained,S1 and S2 within normal limits, no S3, no S4: no clicks, no rubs, no murmurs ABD:  Soft, nontender. BS +, no masses or bruits. No hepatomegaly, no splenomegaly EXT:  2 + pulses throughout, no edema, no cyanosis no clubbing SKIN:  Warm and dry.  No rashes NEURO:  Alert and oriented x 3. Cranial nerves II through XII intact. PSYCH:  Cognitively intact   LABORATORY DATA: Lab Results  Component Value Date   WBC 16.4 (H) 02/06/2019   HGB 9.4 (L) 02/06/2019   HCT 28.2 (L) 02/06/2019   PLT 364 02/06/2019   GLUCOSE 85 02/06/2019   CHOL 176 06/10/2017   TRIG 125 06/10/2017   HDL 66 06/10/2017   LDLCALC 85 06/10/2017   ALT 22 06/20/2018   AST 22 06/20/2018   NA 131 (L) 02/06/2019   K 4.1 02/06/2019   CL 94 (L) 02/06/2019   CREATININE 1.03 02/06/2019   BUN 17 02/06/2019   CO2 21 02/06/2019   TSH 4.300 07/01/2018   INR 1.4 04/24/2016   Echo: 02/09/15  Study Conclusions  - Left ventricle: The cavity size was normal. There was mild focal basal hypertrophy of the septum. Systolic function was normal. The estimated ejection fraction was in the range of 60% to 65%. Wall motion was normal; there were no regional wall motion abnormalities. - Aortic valve: Trileaflet; normal thickness, mildly calcified leaflets. - Mitral valve: Calcified annulus. - Pulmonic valve: There was trivial  regurgitation.  Echo 02/17/19: IMPRESSIONS    1. The left ventricle has normal systolic function, with an ejection fraction of 55-60%. The cavity size was normal. There is mildly increased left ventricular wall thickness. Left ventricular diastolic Doppler parameters are consistent with impaired  relaxation. Indeterminate filling pressures The E/e' is 8-15. No evidence of left ventricular regional wall motion abnormalities.  2. The right ventricle has normal systolic function. The cavity was normal. There is no increase  in right ventricular wall thickness.  3. The mitral valve is grossly normal.  4. The tricuspid valve is grossly normal.  5. The aortic valve is abnormal. Mild sclerosis of the aortic valve. Aortic valve regurgitation is mild by color flow Doppler. No stenosis of the aortic valve.  6. 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  7. When compared to the prior study: 02/09/15 EF 60-65%.  Myoview 02/12/19: Study Highlights    The left ventricular ejection fraction is normal (55-65%).  Nuclear stress EF: 60%.  There was no ST segment deviation noted during stress.  Defect 1: There is a medium defect of moderate severity present in the basal inferolateral, mid inferolateral and apex location.  This is a low risk study.   Normal, low risk stress nuclear study with prominent inferior and apical thinning but no ischemia.  Gated ejection fraction 60% with normal wall motion.    Assessment / Plan: 1. Coronary disease status post stenting of the LAD in 2009 with Taxus stents. Nuclear stress test last month was low risk and unchanged from prior. Echo normal. He is minimally symptomatic with stable class 1-2 symptoms.  Continue with risk factor modification. No Antiplatelet therapy now that he is on Xarelto. Continue Toprol XL.   2. Atrial fibrillation- paroxysmal. In NSR today.   He is asymptomatic.  [place] vasc score is 3. Recommend continued abstinence from  alcohol. Avoid NSAIDs. Now on Xarelto.   3. Hyperlipidemia Was on Zocor but for some reason is not taking it now although he has 2 bottles of it at home. Will resume.   4. History of Mobitz type 1 second degree AV block.   5. HTN controlled  6. Gout. Controlled. Continue allopurinol   7. Etoh abuse. Recommend total abstinence.   8. Obesity.   9. Peripheral neuropathy. B12 and TSH normal.    10. Anemia. Hgb dropped 4 grams from 8 months ago. Will repeat Hgb today on iron and check iron studies. Strongly recommend GI evaluation since he is on Xarelto, has new anemia, and has never had EGD.   I will follow up in 6 months.

## 2019-03-09 ENCOUNTER — Encounter: Payer: Self-pay | Admitting: Cardiology

## 2019-03-09 ENCOUNTER — Telehealth: Payer: Medicare Other | Admitting: Cardiology

## 2019-03-09 ENCOUNTER — Other Ambulatory Visit: Payer: Self-pay

## 2019-03-09 ENCOUNTER — Ambulatory Visit (INDEPENDENT_AMBULATORY_CARE_PROVIDER_SITE_OTHER): Payer: Medicare Other | Admitting: Cardiology

## 2019-03-09 VITALS — BP 140/84 | HR 74 | Temp 97.0°F | Ht 70.0 in | Wt 224.0 lb

## 2019-03-09 DIAGNOSIS — I482 Chronic atrial fibrillation, unspecified: Secondary | ICD-10-CM

## 2019-03-09 DIAGNOSIS — I2510 Atherosclerotic heart disease of native coronary artery without angina pectoris: Secondary | ICD-10-CM | POA: Diagnosis not present

## 2019-03-09 DIAGNOSIS — Z7901 Long term (current) use of anticoagulants: Secondary | ICD-10-CM | POA: Diagnosis not present

## 2019-03-09 DIAGNOSIS — R5383 Other fatigue: Secondary | ICD-10-CM

## 2019-03-09 DIAGNOSIS — D649 Anemia, unspecified: Secondary | ICD-10-CM

## 2019-03-09 DIAGNOSIS — I10 Essential (primary) hypertension: Secondary | ICD-10-CM

## 2019-03-09 NOTE — Patient Instructions (Signed)
We will repeat your blood counts today and check your iron level  I recommend you see a gastroenterologist to evaluate the bowel as a source of bleeding  Continue your current therapy

## 2019-03-10 ENCOUNTER — Telehealth: Payer: Self-pay

## 2019-03-10 LAB — CBC WITH DIFFERENTIAL/PLATELET
Basophils Absolute: 0.1 10*3/uL (ref 0.0–0.2)
Basos: 1 %
EOS (ABSOLUTE): 0.1 10*3/uL (ref 0.0–0.4)
Eos: 1 %
Hematocrit: 35.8 % — ABNORMAL LOW (ref 37.5–51.0)
Hemoglobin: 11.6 g/dL — ABNORMAL LOW (ref 13.0–17.7)
Immature Grans (Abs): 0.1 10*3/uL (ref 0.0–0.1)
Immature Granulocytes: 1 %
Lymphocytes Absolute: 1.7 10*3/uL (ref 0.7–3.1)
Lymphs: 17 %
MCH: 29.8 pg (ref 26.6–33.0)
MCHC: 32.4 g/dL (ref 31.5–35.7)
MCV: 92 fL (ref 79–97)
Monocytes Absolute: 0.9 10*3/uL (ref 0.1–0.9)
Monocytes: 10 %
Neutrophils Absolute: 7 10*3/uL (ref 1.4–7.0)
Neutrophils: 70 %
Platelets: 293 10*3/uL (ref 150–450)
RBC: 3.89 x10E6/uL — ABNORMAL LOW (ref 4.14–5.80)
RDW: 16.5 % — ABNORMAL HIGH (ref 11.6–15.4)
WBC: 9.8 10*3/uL (ref 3.4–10.8)

## 2019-03-10 LAB — IRON,TIBC AND FERRITIN PANEL
Ferritin: 13 ng/mL — ABNORMAL LOW (ref 30–400)
Iron Saturation: 10 % — ABNORMAL LOW (ref 15–55)
Iron: 40 ug/dL (ref 38–169)
Total Iron Binding Capacity: 416 ug/dL (ref 250–450)
UIBC: 376 ug/dL — ABNORMAL HIGH (ref 111–343)

## 2019-03-10 MED ORDER — POLYSACCHARIDE IRON COMPLEX 150 MG PO CAPS: 150.0000 mg | ORAL_CAPSULE | Freq: Two times a day (BID) | ORAL | 3 refills | Status: DC

## 2019-03-10 NOTE — Telephone Encounter (Signed)
Spoke to patient lab results given.Avised to increase iron to twice a day.Advised to keep appointment with GI Dr.Kimberley Beavers 04/07/19 at 8:30 am.Advised to follow up with PCP Mort Sawyers NP.

## 2019-03-27 DIAGNOSIS — F5101 Primary insomnia: Secondary | ICD-10-CM | POA: Diagnosis not present

## 2019-03-27 DIAGNOSIS — R12 Heartburn: Secondary | ICD-10-CM | POA: Diagnosis not present

## 2019-03-27 DIAGNOSIS — D508 Other iron deficiency anemias: Secondary | ICD-10-CM | POA: Diagnosis not present

## 2019-03-27 DIAGNOSIS — F331 Major depressive disorder, recurrent, moderate: Secondary | ICD-10-CM | POA: Diagnosis not present

## 2019-03-27 DIAGNOSIS — E538 Deficiency of other specified B group vitamins: Secondary | ICD-10-CM | POA: Diagnosis not present

## 2019-04-03 DIAGNOSIS — E538 Deficiency of other specified B group vitamins: Secondary | ICD-10-CM | POA: Diagnosis not present

## 2019-04-07 ENCOUNTER — Ambulatory Visit: Payer: Medicare Other | Admitting: Gastroenterology

## 2019-05-05 DIAGNOSIS — E538 Deficiency of other specified B group vitamins: Secondary | ICD-10-CM | POA: Diagnosis not present

## 2019-05-16 ENCOUNTER — Other Ambulatory Visit: Payer: Self-pay | Admitting: Cardiology

## 2019-05-16 DIAGNOSIS — I4819 Other persistent atrial fibrillation: Secondary | ICD-10-CM

## 2019-06-09 DIAGNOSIS — F5101 Primary insomnia: Secondary | ICD-10-CM | POA: Diagnosis not present

## 2019-06-09 DIAGNOSIS — M25512 Pain in left shoulder: Secondary | ICD-10-CM | POA: Diagnosis not present

## 2019-06-09 DIAGNOSIS — E538 Deficiency of other specified B group vitamins: Secondary | ICD-10-CM | POA: Diagnosis not present

## 2019-06-09 DIAGNOSIS — Z23 Encounter for immunization: Secondary | ICD-10-CM | POA: Diagnosis not present

## 2019-06-09 DIAGNOSIS — L84 Corns and callosities: Secondary | ICD-10-CM | POA: Diagnosis not present

## 2019-06-09 DIAGNOSIS — G8929 Other chronic pain: Secondary | ICD-10-CM | POA: Diagnosis not present

## 2019-06-19 DIAGNOSIS — M205X1 Other deformities of toe(s) (acquired), right foot: Secondary | ICD-10-CM | POA: Diagnosis not present

## 2019-06-19 DIAGNOSIS — M205X2 Other deformities of toe(s) (acquired), left foot: Secondary | ICD-10-CM | POA: Diagnosis not present

## 2019-06-19 DIAGNOSIS — M2012 Hallux valgus (acquired), left foot: Secondary | ICD-10-CM | POA: Diagnosis not present

## 2019-06-19 DIAGNOSIS — Z8739 Personal history of other diseases of the musculoskeletal system and connective tissue: Secondary | ICD-10-CM | POA: Diagnosis not present

## 2019-06-19 DIAGNOSIS — M2042 Other hammer toe(s) (acquired), left foot: Secondary | ICD-10-CM | POA: Diagnosis not present

## 2019-06-19 DIAGNOSIS — M2011 Hallux valgus (acquired), right foot: Secondary | ICD-10-CM | POA: Diagnosis not present

## 2019-06-19 DIAGNOSIS — M2041 Other hammer toe(s) (acquired), right foot: Secondary | ICD-10-CM | POA: Diagnosis not present

## 2019-06-22 ENCOUNTER — Telehealth: Payer: Self-pay | Admitting: Cardiology

## 2019-06-22 NOTE — Telephone Encounter (Signed)
No samples available. Sande Rives (daughter) notified she will need to pay for the rx and to keep calling back for samples we are happy to give them if we have any available

## 2019-06-22 NOTE — Telephone Encounter (Signed)
New Message    Patient calling the office for samples of medication:   1.  What medication and dosage are you requesting samples for?  Xarelto   2.  Are you currently out of this medication?   Out of medication.

## 2019-06-22 NOTE — Telephone Encounter (Signed)
  Daughter is calling back because she would like to have someone pick the samples up this afternoon if possible. Please advise.

## 2019-07-03 DIAGNOSIS — M129 Arthropathy, unspecified: Secondary | ICD-10-CM | POA: Diagnosis not present

## 2019-07-21 DIAGNOSIS — N410 Acute prostatitis: Secondary | ICD-10-CM | POA: Diagnosis not present

## 2019-07-21 DIAGNOSIS — R5382 Chronic fatigue, unspecified: Secondary | ICD-10-CM | POA: Diagnosis not present

## 2019-07-22 ENCOUNTER — Telehealth: Payer: Self-pay | Admitting: Cardiology

## 2019-07-22 NOTE — Telephone Encounter (Signed)
Returned call to patient's daughter [name] she was calling to verify father's medications.Stated he has been confused this past week and she wanted to make sure he is taking medications correctly.Stated he has a UTI and was started on a antibiotic this week.Medications reviewed she voiced understanding.Advised to keep appointment as planned with Dr.Jordan.Advised to call sooner if needed.

## 2019-07-22 NOTE — Telephone Encounter (Signed)
New Message:      Please call, concerning pt's medicine. SHe thinks he have it mixed up.   :

## 2019-07-31 ENCOUNTER — Telehealth: Payer: Self-pay | Admitting: Cardiology

## 2019-07-31 DIAGNOSIS — J189 Pneumonia, unspecified organism: Secondary | ICD-10-CM | POA: Diagnosis not present

## 2019-07-31 DIAGNOSIS — Z20828 Contact with and (suspected) exposure to other viral communicable diseases: Secondary | ICD-10-CM | POA: Diagnosis not present

## 2019-07-31 NOTE — Telephone Encounter (Signed)
Called patient advised Dr.Jordan has a cancellation on Mon 12/7.Daughter stated she cannot bring him on 12/7.They will keep appointment 12/9 at 10:45 am with Jory Sims DNP.

## 2019-07-31 NOTE — Telephone Encounter (Signed)
Pt c/o Shortness Of Breath: STAT if SOB developed within the last 24 hours or pt is noticeably SOB on the phone  1. Are you currently SOB (can you hear that pt is SOB on the phone)? no  2. How long have you been experiencing SOB? Not sure  3. Are you SOB when sitting or when up moving around? When he is up moving around. He gets short of breath when walking around the house.   4. Are you currently experiencing any other symptoms? No   Patients daughter is concerned there might be a blockage.

## 2019-07-31 NOTE — Telephone Encounter (Signed)
Returned call to patient's daughter [name] she stated father has been sob for the past 2 weeks.Hard to walk from room to room.Stated he has been getting worse since her mother died last year.No swelling in lower legs.Weight stable.No chest pain.Appointment scheduled with Jory Sims DNP 08/05/19 at 10:45 am.Advised to go to ED if he gets worse.

## 2019-08-03 ENCOUNTER — Ambulatory Visit: Payer: Medicare Other | Admitting: Cardiology

## 2019-08-03 NOTE — Progress Notes (Deleted)
Cardiology Office Note   Date:  08/03/2019   ID:  Gregory Wheeler, DOB 09/26/1940, MRN [id number]  PCP:  Jacqualine Mau, NP  Cardiologist:  Dr. Swaziland  No chief complaint on file.    History of Present Illness: Gregory Wheeler is a 78 y.o. male who presents for ongoing assessment and management of coronary artery disease status post stenting of the LAD with overlapping Taxus stents in December 22, 2007.  Follow-up Myoview in 27-Apr-2015showed mild apical thinning with normal EF of 67%.  Other history of atrial fibrillation on metoprolol and Xarelto.  He was scheduled to have a cardioversion in 22-Dec-2014 but on arrival he had return to normal sinus rhythm.  Patient also has a history of EtOH abuse.  He has chronic symptoms of dyspnea on exertion and fatigue.  When seen last by Dr. Swaziland on 03/09/2019 there was no changes made to his medication regimen.  Due to history of anemia in the past he repeated his hemoglobin and iron studies.  He was advised to follow-up with GI since he was on Xarelto.  Labs on 03/09/2019 revealed hemoglobin 11.6 and hematocrit of 35.8.  His iron studies were within acceptable limits, and he was to continue his iron supplementation and follow-up with GI as recommended.  The patient's daughter Gregory Wheeler called our office on 07/31/2019 reporting that her father had worsening shortness of breath for the last 2 weeks, hard to walk from room to room, with worsening fatigue.  It was also reported that he had been getting worse since his wife died in 12/21/2017.  As Dr. Swaziland did not have any openings in his office clinic, he was scheduled to be seen by me for follow-up.  Past Medical History:  Diagnosis Date  . Arthritis   . Atrial fibrillation (HCC) 01/31/2015  . Chronic gout   . Coronary artery disease 12/2007   STATUS POST STENTING OF THE LEFT ANTERIOR DESCENDING ARTERY  . Dyslipidemia   . Gout   . Hiatal hernia   . Hyperlipidemia   . Hypertension   . LBP (low back pain)   . Partial small bowel  obstruction (HCC)   . RBBB (right bundle branch block)   . SBO (small bowel obstruction) (HCC)    PARTIAL  . Second degree AV block, Mobitz type I   . Urinary tract infection    Escherichia coli urinary tract infection    Past Surgical History:  Procedure Laterality Date  . CARDIAC CATHETERIZATION    . HERNIA REPAIR     INGUINAL  . ROTATOR CUFF REPAIR       Current Outpatient Medications  Medication Sig Dispense Refill  . allopurinol (ZYLOPRIM) 300 MG tablet Take 1 tablet (300 mg total) by mouth daily. 90 tablet 3  . ALPRAZolam (XANAX) 0.25 MG tablet Take 1 tablet (0.25 mg total) by mouth 3 (three) times daily as needed for anxiety. 50 tablet 0  . escitalopram (LEXAPRO) 10 MG tablet Take 1 tablet by mouth daily.    . folic acid (FOLVITE) 1 MG tablet Take 2 tablets by mouth daily.    . iron polysaccharides (NIFEREX) 150 MG capsule Take 1 capsule (150 mg total) by mouth 2 (two) times daily. 60 capsule 3  . losartan (COZAAR) 25 MG tablet TAKE 1 TABLET EVERY DAY 90 tablet 1  . metoprolol succinate (TOPROL-XL) 50 MG 24 hr tablet TAKE 1 TABLET DAILY. TAKE WITH OR IMMEDIATELY FOLLOWING A MEAL. 90 tablet 1  . XARELTO 20 MG TABS  tablet TAKE 1 TABLET BY MOUTH EVERY DAY WITH SUPPER 90 tablet 1   No current facility-administered medications for this visit.     Allergies:   Patient has no known allergies.    Social History:  The patient  reports that he quit smoking about 28 years ago. He has never used smokeless tobacco. He reports current alcohol use. He reports that he does not use drugs.   Family History:  The patient's family history includes Emphysema (age of onset: 101) in his mother; Heart attack (age of onset: 34) in his father.    ROS: All other systems are reviewed and negative. Unless otherwise mentioned in H&P    PHYSICAL EXAM: VS:  There were no vitals taken for this visit. , BMI There is no height or weight on file to calculate BMI. GEN: Well nourished, well developed,  in no acute distress HEENT: normal Neck: no JVD, carotid bruits, or masses Cardiac: ***RRR; no murmurs, rubs, or gallops,no edema  Respiratory:  Clear to auscultation bilaterally, normal work of breathing GI: soft, nontender, nondistended, + BS MS: no deformity or atrophy Skin: warm and dry, no rash Neuro:  Strength and sensation are intact Psych: euthymic mood, full affect   EKG:  EKG {ACTION; IS/IS YIF:[id number]} ordered today. The ekg ordered today demonstrates ***   Recent Labs: 02/06/2019: BUN 17; Creatinine, Ser 1.03; NT-Pro BNP 242; Potassium 4.1; Sodium 131 03/09/2019: Hemoglobin 11.6; Platelets 293    Lipid Panel    Component Value Date/Time   CHOL 176 06/10/2017 0839   TRIG 125 06/10/2017 0839   HDL 66 06/10/2017 0839   CHOLHDL 4.8 04/16/2016 1125   VLDL 44 (H) 04/16/2016 1125   LDLCALC 85 06/10/2017 0839      Wt Readings from Last 3 Encounters:  03/09/19 224 lb (101.6 kg)  02/12/19 226 lb (102.5 kg)  02/06/19 226 lb 9.6 oz (102.8 kg)      Other studies Reviewed: Additional studies/ records that were reviewed today include: ***. Review of the above records demonstrates: ***   ASSESSMENT AND PLAN:  1.  ***   Current medicines are reviewed at length with the patient today.    Labs/ tests ordered today include: *** Phill Myron. [place] Pugh, ANP, AACC   08/03/2019 1:20 PM    [facility] [place] [address] Office [phone] Fax [phone]  Notice: This dictation was prepared with Dragon dictation along with smaller phrase technology. Any transcriptional errors that result from this process are unintentional and may not be corrected upon review.

## 2019-08-05 ENCOUNTER — Ambulatory Visit: Payer: Medicare Other | Admitting: Adult Health

## 2019-09-07 ENCOUNTER — Telehealth: Payer: Self-pay | Admitting: Cardiology

## 2019-09-07 NOTE — Telephone Encounter (Signed)
New message ° ° °Patient calling the office for samples of medication: ° ° °1.  What medication and dosage are you requesting samples for?    xarelto 20mg ° °2.  Are you currently out of this medication? no ° ° °

## 2019-09-07 NOTE — Telephone Encounter (Signed)
DPR on file. Spoke with Gregory Wheeler, Pt daughter. Romona calling for Xarelto 20 mg samples for pt. No samples available. Offered to send Rx to preferred pharmacy. She states she was informed cost of 15 tabs was around $300 and pt cannot afford this. Offered PA app for Xarelto. She states pt has upcoming appt and requests triage nurse note somewhere that pt can pick up PA app that day. Included note about PA app in 1/21 appt notes. Advised that she may call back later this week or next week for updates on samples. She requests that pt be accompanied by her brother d/t HOH and Hx of arthritis and issues with ambulation. Informed that message to be routed just to make sure this is okay. Romona agreeable

## 2019-09-10 ENCOUNTER — Telehealth: Payer: Self-pay | Admitting: Cardiology

## 2019-09-10 NOTE — Telephone Encounter (Signed)
New message:     Patient daughter calling stating that her bother need to come with the patient to his up coming appt. Please call patient daughter.

## 2019-09-10 NOTE — Telephone Encounter (Signed)
Spoke with the patients daughter who is requesting permission for the patients son, Christen Bame to accompany him to his appt on 1/21 due to the patient not being able to walk well alone along with his hearing impairment.   Informed the pts daughter that Christen Bame would be able to assist his father during the appt.

## 2019-09-10 NOTE — Progress Notes (Signed)
Gregory Wheeler Date of Birth: [birth year]   History of Present Illness: [name] is seen for follow up of CAD and atrial fibrillation.  He has a history of coronary disease and is status post stenting of the LAD with overlapping Taxus stents in 2009. Myoview in April 2015 showed mild apical thinning, normal EF 67%.  He  was seen in the ED in February 2016 for symptoms of left arm numbness and pain. This was worse with lifting his arm. Ecg was done and demonstrated tachycardia with no clear P waves and rate 113.   He was seen in June 2016 symptoms of fatigue and acute gout flair in his right knee bursa. He was noted to be in atrial fibrillation with RVR (and review of ECG from February showed the same). He had been drinking Etoh heavily. He was started on metoprolol and Xarelto.   He was scheduled for a DCCV on 03/03/15 but on arrival he was in NSR with rate 77.   He was seen in the ED on 06/20/18 with abdominal pain and had an umbilical hernia reduced.   He was seen in June 2020  with symptoms of increased DOE and fatigue. Was noted to be anemic with Hgb 9. Low folate. Normal BNP. Echo was normal. Myoview was low risk without significant ischemia and unchanged from prior studies. He was started on iron therapy. GI evaluation was warranted but he did not pursue. Hgb did improve to 11.6. He is also B12 deficient and on shots.   Today he is seen with his son. Seems to be doing well. Denies any palpitations, chest pain, abdominal pain. Breathing is doing good. He was seen by primary care on 1/12 and BP was low at 91/46. Losartan was stopped. He also hasn't taken Toprol in the last week. Quit taking iron supplement because it upset his bowels. He is having a lot of neck, back, and hip pain. Scheduled for a series of injections at [facility].        Current Outpatient Medications on File Prior to Visit  Medication Sig Dispense Refill  . allopurinol (ZYLOPRIM) 300 MG tablet Take 1 tablet (300 mg total)  by mouth daily. 90 tablet 3  . ALPRAZolam (XANAX) 0.25 MG tablet Take 1 tablet (0.25 mg total) by mouth 3 (three) times daily as needed for anxiety. 50 tablet 0  . escitalopram (LEXAPRO) 10 MG tablet Take 1 tablet by mouth daily.    . folic acid (FOLVITE) 1 MG tablet Take 2 tablets by mouth daily.    . metoprolol succinate (TOPROL-XL) 50 MG 24 hr tablet TAKE 1 TABLET DAILY. TAKE WITH OR IMMEDIATELY FOLLOWING A MEAL. 90 tablet 3   No current facility-administered medications on file prior to visit.    No Known Allergies  Past Medical History:  Diagnosis Date  . Arthritis   . Atrial fibrillation (HCC) 01/31/2015  . Chronic gout   . Coronary artery disease 12/2007   STATUS POST STENTING OF THE LEFT ANTERIOR DESCENDING ARTERY  . Dyslipidemia   . Gout   . Hiatal hernia   . Hyperlipidemia   . Hypertension   . LBP (low back pain)   . Partial small bowel obstruction (HCC)   . RBBB (right bundle branch block)   . SBO (small bowel obstruction) (HCC)    PARTIAL  . Second degree AV block, Mobitz type I   . Urinary tract infection    Escherichia coli urinary tract infection    Past Surgical  History:  Procedure Laterality Date  . CARDIAC CATHETERIZATION    . HERNIA REPAIR     INGUINAL  . ROTATOR CUFF REPAIR      Social History   Tobacco Use  Smoking Status Former Smoker  . Quit date: 08/27/1990  . Years since quitting: 29.0  Smokeless Tobacco Never Used    Social History   Substance and Sexual Activity  Alcohol Use Yes   Comment: near daily    Family History  Problem Relation Age of Onset  . Emphysema Mother 56  . Heart attack Father 6    Review of Systems: As noted in HPI All other systems were reviewed and are negative.  Physical Exam: BP (!) 150/87   Pulse (!) 106   Ht 5\' 10"  (1.778 m)   Wt 235 lb (106.6 kg)   SpO2 100%   BMI 33.72 kg/m  GENERAL:  Overweight WM in NAD HEENT:  PERRL, EOMI, sclera are clear. Oropharynx is clear. NECK:  No jugular venous  distention, carotid upstroke brisk and symmetric, no bruits, no thyromegaly or adenopathy LUNGS:  Clear to auscultation bilaterally CHEST:  Unremarkable HEART:  RRR,  PMI not displaced or sustained,S1 and S2 within normal limits, no S3, no S4: no clicks, no rubs, no murmurs ABD:  Soft, nontender. BS +, no masses or bruits. No hepatomegaly, no splenomegaly EXT:  2 + pulses throughout, no edema, no cyanosis no clubbing SKIN:  Warm and dry.  No rashes NEURO:  Alert and oriented x 3. Cranial nerves II through XII intact. PSYCH:  Cognitively intact   LABORATORY DATA: Lab Results  Component Value Date   WBC 9.8 03/09/2019   HGB 11.6 (L) 03/09/2019   HCT 35.8 (L) 03/09/2019   PLT 293 03/09/2019   GLUCOSE 85 02/06/2019   CHOL 176 06/10/2017   TRIG 125 06/10/2017   HDL 66 06/10/2017   LDLCALC 85 06/10/2017   ALT 22 06/20/2018   AST 22 06/20/2018   NA 131 (L) 02/06/2019   K 4.1 02/06/2019   CL 94 (L) 02/06/2019   CREATININE 1.03 02/06/2019   BUN 17 02/06/2019   CO2 21 02/06/2019   TSH 4.300 07/01/2018   INR 1.4 04/24/2016   Dated 12/30/18: cholesterol 129, triglycerides 97, HDL 47, LDL 85. Normal TSH. Dated 03/27/19: Hgb 11.3. ferritin 17. B12 175,   Echo: 02/09/15  Study Conclusions  - Left ventricle: The cavity size was normal. There was mild focal basal hypertrophy of the septum. Systolic function was normal. The estimated ejection fraction was in the range of 60% to 65%. Wall motion was normal; there were no regional wall motion abnormalities. - Aortic valve: Trileaflet; normal thickness, mildly calcified leaflets. - Mitral valve: Calcified annulus. - Pulmonic valve: There was trivial regurgitation.  Echo 02/17/19: IMPRESSIONS    1. The left ventricle has normal systolic function, with an ejection fraction of 55-60%. The cavity size was normal. There is mildly increased left ventricular wall thickness. Left ventricular diastolic Doppler parameters are consistent  with impaired  relaxation. Indeterminate filling pressures The E/e' is 8-15. No evidence of left ventricular regional wall motion abnormalities.  2. The right ventricle has normal systolic function. The cavity was normal. There is no increase in right ventricular wall thickness.  3. The mitral valve is grossly normal.  4. The tricuspid valve is grossly normal.  5. The aortic valve is abnormal. Mild sclerosis of the aortic valve. Aortic valve regurgitation is mild by color flow Doppler. No stenosis of the  aortic valve.  6. 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  7. When compared to the prior study: 02/09/15 EF 60-65%.  Myoview 02/12/19: Study Highlights    The left ventricular ejection fraction is normal (55-65%).  Nuclear stress EF: 60%.  There was no ST segment deviation noted during stress.  Defect 1: There is a medium defect of moderate severity present in the basal inferolateral, mid inferolateral and apex location.  This is a low risk study.   Normal, low risk stress nuclear study with prominent inferior and apical thinning but no ischemia.  Gated ejection fraction 60% with normal wall motion.    Assessment / Plan: 1. Coronary disease status post stenting of the LAD in 2009 with Taxus stents. Nuclear stress test in June 2020 was low risk and unchanged from prior. Echo normal. He is minimally symptomatic with stable class 1 symptoms.  Continue with risk factor modification. No Antiplatelet therapy now that he is on Xarelto. Continue Toprol XL.   2. Atrial fibrillation- paroxysmal. HR is elevated today so he may be in Afib or this may just reflect the fact that he missed his Toprol this week.   He is asymptomatic. Will resume Toprol.   [place] vasc score is 3.  Avoid NSAIDs. Now on Xarelto.   3. Hyperlipidemia   4. History of Mobitz type 1 second degree AV block.   5. HTN with recent low BP- agree with stopping losartan. Continue Toprol  6. Gout.  Controlled. Continue allopurinol   7. Etoh abuse. Recommend total abstinence.   8. Obesity.   9. Peripheral neuropathy. Now on B12 shots.   10. Anemia. With iron deficiency. Recommend he resume Niferex 150 mg once a day. If he is not able to tolerate this may need iron infusion. Will defer to primary care.  I will follow up in 6 months.

## 2019-09-12 ENCOUNTER — Other Ambulatory Visit: Payer: Self-pay | Admitting: Cardiology

## 2019-09-15 ENCOUNTER — Other Ambulatory Visit: Payer: Self-pay | Admitting: Cardiology

## 2019-09-15 NOTE — Telephone Encounter (Signed)
Rx(s) sent to pharmacy electronically.  

## 2019-09-15 NOTE — Telephone Encounter (Signed)
New Message     *STAT* If patient is at the pharmacy, call can be transferred to refill team.   1. Which medications need to be refilled? (please list name of each medication and dose if known) metoprolol succinate (TOPROL-XL) 50 MG 24 hr tablet  2. Which pharmacy/location (including street and city if local pharmacy) is medication to be sent to? [id number] - [place], [place] - [address]  3. Do they need a 30 day or 90 day supply? 30 or 90    Pt is out of medication, they are wondering if there is any samples, and if not if a prescription can be sent to the pharmacy    Please call

## 2019-09-15 NOTE — Telephone Encounter (Signed)
Spoke to patient's daughter Christella Hartigan Xarelto 20 mg samples left at [employer] office front desk.

## 2019-09-16 MED ORDER — METOPROLOL SUCCINATE ER 50 MG PO TB24: ORAL_TABLET | ORAL | 3 refills | Status: DC

## 2019-09-16 NOTE — Telephone Encounter (Signed)
Informed daughter the a new Rx for metoprolol to [facility] pharmacy yesterday. Daughter stated they will no longer use [facility] and would like new Rx sent to [facility].  New Rx sent

## 2019-09-16 NOTE — Telephone Encounter (Signed)
Patient calling the office for samples of medication:   1.  What medication and dosage are you requesting samples for?  metoprolol succinate (TOPROL-XL) 50 MG 24 hr tablet   2.  Are you currently out of this medication? Yes   Daughter of the patient called. She sent in a refill request yesterday but has not heard anything from the office yet. The patient is out of his medication, and she wanted to know if the office had any samples for her to pick up. If the office does not have any samples, a new rx will need to be sent to the pharmacy today

## 2019-09-17 ENCOUNTER — Encounter (INDEPENDENT_AMBULATORY_CARE_PROVIDER_SITE_OTHER): Payer: Self-pay

## 2019-09-17 ENCOUNTER — Encounter: Payer: Self-pay | Admitting: Cardiology

## 2019-09-17 ENCOUNTER — Other Ambulatory Visit: Payer: Self-pay

## 2019-09-17 ENCOUNTER — Ambulatory Visit (INDEPENDENT_AMBULATORY_CARE_PROVIDER_SITE_OTHER): Payer: Medicare Other | Admitting: Cardiology

## 2019-09-17 VITALS — BP 150/87 | HR 106 | Ht 70.0 in | Wt 235.0 lb

## 2019-09-17 DIAGNOSIS — R5383 Other fatigue: Secondary | ICD-10-CM

## 2019-09-17 DIAGNOSIS — I2510 Atherosclerotic heart disease of native coronary artery without angina pectoris: Secondary | ICD-10-CM | POA: Diagnosis not present

## 2019-09-17 DIAGNOSIS — I480 Paroxysmal atrial fibrillation: Secondary | ICD-10-CM

## 2019-09-17 DIAGNOSIS — D509 Iron deficiency anemia, unspecified: Secondary | ICD-10-CM

## 2019-09-17 DIAGNOSIS — I4819 Other persistent atrial fibrillation: Secondary | ICD-10-CM

## 2019-09-17 MED ORDER — POLYSACCHARIDE IRON COMPLEX 150 MG PO CAPS: 150.0000 mg | ORAL_CAPSULE | Freq: Every day | ORAL | 3 refills | Status: DC

## 2019-09-17 MED ORDER — RIVAROXABAN 20 MG PO TABS: 20.0000 mg | ORAL_TABLET | Freq: Every day | ORAL | 1 refills | Status: DC

## 2019-09-17 NOTE — Patient Instructions (Addendum)
Stay on Toprol XL 25 mg daily  Stop the losartan as Dr Magnus Ivan said.   Try and take Niferex (iron) once a day. If you don't tolerate this Dr Magnus Ivan may need to consider for iron infusion.  I will see you in 6 months.

## 2019-09-28 ENCOUNTER — Telehealth: Payer: Self-pay | Admitting: Cardiology

## 2019-09-28 NOTE — Telephone Encounter (Signed)
Patient calling the office for samples of medication:   1.  What medication and dosage are you requesting samples for? rivaroxaban (XARELTO) 20 MG TABS tablet  2.  Are you currently out of this medication? Almost.

## 2019-09-28 NOTE — Telephone Encounter (Signed)
LVM per DPR stating that xarelto samples were up front for the pt.

## 2019-11-12 ENCOUNTER — Telehealth: Payer: Self-pay | Admitting: Cardiology

## 2019-11-12 DIAGNOSIS — I4819 Other persistent atrial fibrillation: Secondary | ICD-10-CM

## 2019-11-12 MED ORDER — RIVAROXABAN 20 MG PO TABS: 20.0000 mg | ORAL_TABLET | Freq: Every day | ORAL | 6 refills | Status: DC

## 2019-11-12 NOTE — Telephone Encounter (Signed)
New Message  Patient's wife called and wanted to know if the patient could get refills for rivaroxaban (XARELTO) 20 MG TABS tablet. She wants someone to call her at [phone]

## 2019-11-12 NOTE — Telephone Encounter (Signed)
Spoke to patient's daughter Christella Hartigan Xarelto 20 mg refill sent to pharmacy.

## 2019-11-12 NOTE — Telephone Encounter (Signed)
Spoke to patient's daughter [name] Xarelto 20 mg samples left at [facility] office front desk. 

## 2019-12-07 ENCOUNTER — Telehealth: Payer: Self-pay | Admitting: Cardiology

## 2019-12-07 NOTE — Telephone Encounter (Signed)
Patient calling the office for samples of medication:   1.  What medication and dosage are you requesting samples for? rivaroxaban (XARELTO) 20 MG TABS tablet  2.  Are you currently out of this medication? Patient's daughter states he is getting low.

## 2019-12-07 NOTE — Telephone Encounter (Signed)
Unable to get in touch with [facility] who called, able to speak with pt. Notified that samples of his Xarelto were being left at the front desk for him to pick up. Pt verbalized understanding with no other questions at this time.

## 2019-12-25 ENCOUNTER — Telehealth: Payer: Self-pay | Admitting: Cardiology

## 2019-12-25 NOTE — Telephone Encounter (Signed)
New Message  Latoya Gunther's daughter on the line and she wants to know if she can get xarelto samples. says that its very expensive for patient to get it

## 2019-12-28 NOTE — Telephone Encounter (Signed)
Follow Up  Patient calling the office for samples of medication:   1.  What medication and dosage are you requesting samples for? Xarelto   2.  Are you currently out of this medication? Yes

## 2019-12-28 NOTE — Telephone Encounter (Signed)
Xarelto 20 mg # 2 Lot #[id number] exp 9/23 will be at front for pick up Left message to call back

## 2019-12-28 NOTE — Telephone Encounter (Signed)
Spoke to patient advised Xarelto samples left at [employer] office front desk.

## 2020-03-10 NOTE — Progress Notes (Signed)
Gregory Wheeler Date of Birth: February 04, 1941   History of Present Illness: Gregory Wheeler is seen for follow up of CAD and atrial fibrillation.  He has a history of coronary disease and is status post stenting of the LAD with overlapping Taxus stents in 2009. Myoview in April 2015 showed mild apical thinning, normal EF 67%.  He  was seen in the ED in February 2016 for symptoms of left arm numbness and pain. This was worse with lifting his arm. Ecg was done and demonstrated tachycardia with no clear P waves and rate 113.   He was seen in June 2016 symptoms of fatigue and acute gout flair in his right knee bursa. He was noted to be in atrial fibrillation with RVR (and review of ECG from February showed the same). He had been drinking Etoh heavily. He was started on metoprolol and Xarelto.   He was scheduled for a DCCV on 03/03/15 but on arrival he was in NSR with rate 77.   He was seen in the ED on 06/20/18 with abdominal pain and had an umbilical hernia reduced.   He was seen in June 2020  with symptoms of increased DOE and fatigue. Was noted to be anemic with Hgb 9. Low folate. Normal BNP. Echo was normal. Myoview was low risk without significant ischemia and unchanged from prior studies. He was started on iron therapy. GI evaluation was warranted but he did not pursue. Hgb did improve to 11.6. He is also B12 deficient and on shots.   Today he is seen with his son. Seems to be doing well. Denies any palpitations, chest pain, abdominal pain. Breathing is doing good. His legs limit his activity. He is still drinking Etoh. States he is not drinking much but son notes it is more. He did receive iron infusions this spring. Has not had Hgb rechecked.   Quit taking iron supplement because it upset his bowels. Neck pain is doing OK.   Current Outpatient Medications on File Prior to Visit  Medication Sig Dispense Refill   allopurinol (ZYLOPRIM) 300 MG tablet Take 1 tablet (300 mg total) by mouth daily. 90 tablet  3   ALPRAZolam (XANAX) 0.25 MG tablet Take 1 tablet (0.25 mg total) by mouth 3 (three) times daily as needed for anxiety. 50 tablet 0   escitalopram (LEXAPRO) 10 MG tablet Take 1 tablet by mouth daily.     folic acid (FOLVITE) 1 MG tablet Take 2 tablets by mouth daily.     iron polysaccharides (NIFEREX) 150 MG capsule Take 1 capsule (150 mg total) by mouth daily. 60 capsule 3   metoprolol succinate (TOPROL-XL) 50 MG 24 hr tablet TAKE 1 TABLET DAILY. TAKE WITH OR IMMEDIATELY FOLLOWING A MEAL. 90 tablet 3   No current facility-administered medications on file prior to visit.    No Known Allergies  Past Medical History:  Diagnosis Date   Arthritis    Atrial fibrillation (HCC) 01/31/2015   Chronic gout    Coronary artery disease 12/2007   STATUS POST STENTING OF THE LEFT ANTERIOR DESCENDING ARTERY   Dyslipidemia    Gout    Hiatal hernia    Hyperlipidemia    Hypertension    LBP (low back pain)    Partial small bowel obstruction (HCC)    RBBB (right bundle branch block)    SBO (small bowel obstruction) (HCC)    PARTIAL   Second degree AV block, Mobitz type I    Urinary tract infection  Escherichia coli urinary tract infection    Past Surgical History:  Procedure Laterality Date   CARDIAC CATHETERIZATION     HERNIA REPAIR     INGUINAL   ROTATOR CUFF REPAIR      Social History   Tobacco Use  Smoking Status Former Smoker   Quit date: 08/27/1990   Years since quitting: 29.5  Smokeless Tobacco Never Used    Social History   Substance and Sexual Activity  Alcohol Use Yes   Comment: near daily    Family History  Problem Relation Age of Onset   Emphysema Mother 22   Heart attack Father 78    Review of Systems: As noted in HPI All other systems were reviewed and are negative.  Physical Exam: BP (!) 142/78    Pulse 80    Ht 5\' 10"  (1.778 m)    Wt 243 lb 3.2 oz (110.3 kg)    BMI 34.90 kg/m  GENERAL:  Overweight WM in NAD HEENT:  PERRL,  EOMI, sclera are clear. Oropharynx is clear. NECK:  No jugular venous distention, carotid upstroke brisk and symmetric, no bruits, no thyromegaly or adenopathy LUNGS:  Clear to auscultation bilaterally CHEST:  Unremarkable HEART:  RRR,  PMI not displaced or sustained,S1 and S2 within normal limits, no S3, no S4: no clicks, no rubs, no murmurs ABD:  Soft, nontender. BS +, no masses or bruits. No hepatomegaly, no splenomegaly EXT:  2 + pulses throughout, no edema, no cyanosis no clubbing SKIN:  Warm and dry.  No rashes NEURO:  Alert and oriented x 3. Cranial nerves II through XII intact. PSYCH:  Cognitively intact   LABORATORY DATA: Lab Results  Component Value Date   WBC 9.8 03/09/2019   HGB 11.6 (L) 03/09/2019   HCT 35.8 (L) 03/09/2019   PLT 293 03/09/2019   GLUCOSE 85 02/06/2019   CHOL 176 06/10/2017   TRIG 125 06/10/2017   HDL 66 06/10/2017   LDLCALC 85 06/10/2017   ALT 22 06/20/2018   AST 22 06/20/2018   NA 131 (L) 02/06/2019   K 4.1 02/06/2019   CL 94 (L) 02/06/2019   CREATININE 1.03 02/06/2019   BUN 17 02/06/2019   CO2 21 02/06/2019   TSH 4.300 07/01/2018   INR 1.4 04/24/2016   Dated 12/30/18: cholesterol 129, triglycerides 97, HDL 47, LDL 85. Normal TSH. Dated 03/27/19: Hgb 11.3. ferritin 17. B12 175,  Dated 11/24/19: Hgb 9.8. albumin 3.2. otherwise CMET normal. B12 and folate normal. Iron 36 with sat 11%. Ferritin 15.   Ecg today shows NSR with occ. PVC. Rate 80. LAD, RBBB. I have personally reviewed and interpreted this study.   Echo: 02/09/15  Study Conclusions  - Left ventricle: The cavity size was normal. There was mild focal basal hypertrophy of the septum. Systolic function was normal. The estimated ejection fraction was in the range of 60% to 65%. Wall motion was normal; there were no regional wall motion abnormalities. - Aortic valve: Trileaflet; normal thickness, mildly calcified leaflets. - Mitral valve: Calcified annulus. - Pulmonic valve:  There was trivial regurgitation.  Echo 02/17/19: IMPRESSIONS    1. The left ventricle has normal systolic function, with an ejection fraction of 55-60%. The cavity size was normal. There is mildly increased left ventricular wall thickness. Left ventricular diastolic Doppler parameters are consistent with impaired  relaxation. Indeterminate filling pressures The E/e' is 8-15. No evidence of left ventricular regional wall motion abnormalities.  2. The right ventricle has normal systolic function. The cavity  was normal. There is no increase in right ventricular wall thickness.  3. The mitral valve is grossly normal.  4. The tricuspid valve is grossly normal.  5. The aortic valve is abnormal. Mild sclerosis of the aortic valve. Aortic valve regurgitation is mild by color flow Doppler. No stenosis of the aortic valve.  6. 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  7. When compared to the prior study: 02/09/15 EF 60-65%.  Myoview 02/12/19: Study Highlights    The left ventricular ejection fraction is normal (55-65%).  Nuclear stress EF: 60%.  There was no ST segment deviation noted during stress.  Defect 1: There is a medium defect of moderate severity present in the basal inferolateral, mid inferolateral and apex location.  This is a low risk study.   Normal, low risk stress nuclear study with prominent inferior and apical thinning but no ischemia.  Gated ejection fraction 60% with normal wall motion.    Assessment / Plan: 1. Coronary disease status post stenting of the LAD in 2009 with Taxus stents. Nuclear stress test in June 2020 was low risk and unchanged from prior. Echo normal. He is minimally symptomatic with stable class 1 symptoms.  Continue with risk factor modification. No Antiplatelet therapy now that he is on Xarelto. Continue Toprol XL.   2. Atrial fibrillation- paroxysmal. In NSR today.    He is asymptomatic. Will continue Toprol.   [place] vasc score  is 3.  Avoid NSAIDs. Now on Xarelto.   3. Hyperlipidemia   4. History of Mobitz type 1 second degree AV block. asymptomatic  5. HTN  Continue Toprol  6. Gout. Controlled. Continue allopurinol   7. Etoh abuse. Recommend total abstinence.   8. Obesity.   9. Peripheral neuropathy.   10. Anemia. With iron deficiency. Has had iron infusions. Needs to follow up with primary care  I will follow up in 6 months.

## 2020-03-14 ENCOUNTER — Other Ambulatory Visit: Payer: Self-pay

## 2020-03-14 ENCOUNTER — Encounter: Payer: Self-pay | Admitting: Cardiology

## 2020-03-14 ENCOUNTER — Ambulatory Visit (INDEPENDENT_AMBULATORY_CARE_PROVIDER_SITE_OTHER): Payer: Medicare Other | Admitting: Cardiology

## 2020-03-14 DIAGNOSIS — I25118 Atherosclerotic heart disease of native coronary artery with other forms of angina pectoris: Secondary | ICD-10-CM | POA: Diagnosis not present

## 2020-03-14 DIAGNOSIS — Z9911 Dependence on respirator [ventilator] status: Secondary | ICD-10-CM

## 2020-03-14 DIAGNOSIS — D689 Coagulation defect, unspecified: Secondary | ICD-10-CM

## 2020-03-14 DIAGNOSIS — I2510 Atherosclerotic heart disease of native coronary artery without angina pectoris: Secondary | ICD-10-CM

## 2020-03-14 DIAGNOSIS — I4819 Other persistent atrial fibrillation: Secondary | ICD-10-CM | POA: Diagnosis not present

## 2020-03-14 DIAGNOSIS — G622 Polyneuropathy due to other toxic agents: Secondary | ICD-10-CM | POA: Insufficient documentation

## 2020-03-14 MED ORDER — RIVAROXABAN 20 MG PO TABS: 20.0000 mg | ORAL_TABLET | Freq: Every day | ORAL | 3 refills | Status: DC

## 2020-06-14 ENCOUNTER — Telehealth: Payer: Self-pay | Admitting: Nurse Practitioner

## 2020-06-14 NOTE — Telephone Encounter (Signed)
Called to Discuss with patient about Covid symptoms and the use of the monoclonal antibody infusion for those with mild to moderate Covid symptoms and at a high risk of hospitalization.     Pt is qualified for this infusion due to co-morbid conditions and/or a member of an at-risk group.     Patient Active Problem List   Diagnosis Date Noted  . 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 Long term (current) use of anticoagulants [Z79.01] 02/22/2016  . Hypotension 12/21/2015  . Fatigue 12/21/2015  . Alcohol abuse 02/04/2015  . Infected prepatellar bursa 02/03/2015  . Effusion of right knee 02/03/2015  . AKI (acute kidney injury) (HCC) 02/03/2015  . Chronic atrial fibrillation (HCC) 02/03/2015  . Knee effusion 02/03/2015  . Atrial fibrillation (HCC) 01/31/2015  . Gout 01/31/2015  . Second degree AV block, Mobitz type I 12/09/2013  . RBBB (right bundle branch block) 01/03/2011  . Hypertension   . Hyperlipidemia   . SBO (small bowel obstruction) (HCC)   . LBP (low back pain)   . Arthritis   . Gout   . Coronary artery disease 12/26/2007    Patient declines infusion at this time. Symptoms tier reviewed as well as criteria for ending isolation. Preventative practices reviewed. Patient verbalized understanding.    Patient advised to call back if he/she decides that he/she does want to get infusion. Callback number to the infusion center given. Patient advised to go to Urgent care or ED with severe symptoms.

## 2020-06-22 ENCOUNTER — Encounter (HOSPITAL_BASED_OUTPATIENT_CLINIC_OR_DEPARTMENT_OTHER): Payer: Self-pay | Admitting: Emergency Medicine

## 2020-06-22 ENCOUNTER — Emergency Department (HOSPITAL_BASED_OUTPATIENT_CLINIC_OR_DEPARTMENT_OTHER): Payer: Medicare Other

## 2020-06-22 ENCOUNTER — Inpatient Hospital Stay (HOSPITAL_COMMUNITY): Payer: Medicare Other

## 2020-06-22 ENCOUNTER — Other Ambulatory Visit: Payer: Self-pay

## 2020-06-22 ENCOUNTER — Inpatient Hospital Stay (HOSPITAL_BASED_OUTPATIENT_CLINIC_OR_DEPARTMENT_OTHER)
Admission: EM | Admit: 2020-06-22 | Discharge: 2020-07-13 | DRG: 870 | Disposition: A | Discharge status: Home Health | Payer: Medicare Other | Attending: Internal Medicine | Admitting: Internal Medicine

## 2020-06-22 DIAGNOSIS — Z9289 Personal history of other medical treatment: Secondary | ICD-10-CM

## 2020-06-22 DIAGNOSIS — J9601 Acute respiratory failure with hypoxia: Secondary | ICD-10-CM | POA: Diagnosis present

## 2020-06-22 DIAGNOSIS — E872 Acidosis, unspecified: Secondary | ICD-10-CM

## 2020-06-22 DIAGNOSIS — E876 Hypokalemia: Secondary | ICD-10-CM

## 2020-06-22 DIAGNOSIS — R451 Restlessness and agitation: Secondary | ICD-10-CM | POA: Diagnosis not present

## 2020-06-22 DIAGNOSIS — G9340 Encephalopathy, unspecified: Secondary | ICD-10-CM | POA: Diagnosis not present

## 2020-06-22 DIAGNOSIS — Z7189 Other specified counseling: Secondary | ICD-10-CM | POA: Diagnosis not present

## 2020-06-22 DIAGNOSIS — F05 Delirium due to known physiological condition: Secondary | ICD-10-CM | POA: Diagnosis not present

## 2020-06-22 DIAGNOSIS — Z789 Other specified health status: Secondary | ICD-10-CM

## 2020-06-22 DIAGNOSIS — E8770 Fluid overload, unspecified: Secondary | ICD-10-CM | POA: Diagnosis not present

## 2020-06-22 DIAGNOSIS — K920 Hematemesis: Secondary | ICD-10-CM | POA: Diagnosis not present

## 2020-06-22 DIAGNOSIS — K121 Other forms of stomatitis: Secondary | ICD-10-CM | POA: Diagnosis not present

## 2020-06-22 DIAGNOSIS — Z955 Presence of coronary angioplasty implant and graft: Secondary | ICD-10-CM

## 2020-06-22 DIAGNOSIS — I4510 Unspecified right bundle-branch block: Secondary | ICD-10-CM | POA: Diagnosis not present

## 2020-06-22 DIAGNOSIS — N179 Acute kidney failure, unspecified: Secondary | ICD-10-CM | POA: Diagnosis present

## 2020-06-22 DIAGNOSIS — B952 Enterococcus as the cause of diseases classified elsewhere: Secondary | ICD-10-CM | POA: Diagnosis present

## 2020-06-22 DIAGNOSIS — Z452 Encounter for adjustment and management of vascular access device: Secondary | ICD-10-CM

## 2020-06-22 DIAGNOSIS — R7989 Other specified abnormal findings of blood chemistry: Secondary | ICD-10-CM | POA: Diagnosis present

## 2020-06-22 DIAGNOSIS — R54 Age-related physical debility: Secondary | ICD-10-CM | POA: Diagnosis present

## 2020-06-22 DIAGNOSIS — E43 Unspecified severe protein-calorie malnutrition: Secondary | ICD-10-CM | POA: Diagnosis present

## 2020-06-22 DIAGNOSIS — W19XXXA Unspecified fall, initial encounter: Secondary | ICD-10-CM | POA: Diagnosis present

## 2020-06-22 DIAGNOSIS — K2961 Other gastritis with bleeding: Secondary | ICD-10-CM | POA: Diagnosis not present

## 2020-06-22 DIAGNOSIS — Z87891 Personal history of nicotine dependence: Secondary | ICD-10-CM

## 2020-06-22 DIAGNOSIS — D500 Iron deficiency anemia secondary to blood loss (chronic): Secondary | ICD-10-CM | POA: Diagnosis present

## 2020-06-22 DIAGNOSIS — Z66 Do not resuscitate: Secondary | ICD-10-CM | POA: Diagnosis not present

## 2020-06-22 DIAGNOSIS — R7881 Bacteremia: Principal | ICD-10-CM | POA: Diagnosis present

## 2020-06-22 DIAGNOSIS — R251 Tremor, unspecified: Secondary | ICD-10-CM | POA: Diagnosis not present

## 2020-06-22 DIAGNOSIS — R339 Retention of urine, unspecified: Secondary | ICD-10-CM | POA: Diagnosis not present

## 2020-06-22 DIAGNOSIS — R092 Respiratory arrest: Secondary | ICD-10-CM

## 2020-06-22 DIAGNOSIS — J1282 Pneumonia due to coronavirus disease 2019: Secondary | ICD-10-CM | POA: Diagnosis present

## 2020-06-22 DIAGNOSIS — G6289 Other specified polyneuropathies: Secondary | ICD-10-CM | POA: Diagnosis present

## 2020-06-22 DIAGNOSIS — U071 COVID-19: Secondary | ICD-10-CM | POA: Diagnosis present

## 2020-06-22 DIAGNOSIS — K219 Gastro-esophageal reflux disease without esophagitis: Secondary | ICD-10-CM | POA: Diagnosis present

## 2020-06-22 DIAGNOSIS — J9600 Acute respiratory failure, unspecified whether with hypoxia or hypercapnia: Secondary | ICD-10-CM | POA: Diagnosis not present

## 2020-06-22 DIAGNOSIS — J690 Pneumonitis due to inhalation of food and vomit: Secondary | ICD-10-CM | POA: Diagnosis not present

## 2020-06-22 DIAGNOSIS — E860 Dehydration: Secondary | ICD-10-CM | POA: Diagnosis present

## 2020-06-22 DIAGNOSIS — I959 Hypotension, unspecified: Secondary | ICD-10-CM | POA: Diagnosis not present

## 2020-06-22 DIAGNOSIS — Z0189 Encounter for other specified special examinations: Secondary | ICD-10-CM

## 2020-06-22 DIAGNOSIS — D509 Iron deficiency anemia, unspecified: Secondary | ICD-10-CM | POA: Diagnosis present

## 2020-06-22 DIAGNOSIS — R0602 Shortness of breath: Secondary | ICD-10-CM

## 2020-06-22 DIAGNOSIS — F1010 Alcohol abuse, uncomplicated: Secondary | ICD-10-CM | POA: Diagnosis present

## 2020-06-22 DIAGNOSIS — I2510 Atherosclerotic heart disease of native coronary artery without angina pectoris: Secondary | ICD-10-CM | POA: Diagnosis present

## 2020-06-22 DIAGNOSIS — Z781 Physical restraint status: Secondary | ICD-10-CM

## 2020-06-22 DIAGNOSIS — F32A Depression, unspecified: Secondary | ICD-10-CM | POA: Diagnosis present

## 2020-06-22 DIAGNOSIS — Z6831 Body mass index (BMI) 31.0-31.9, adult: Secondary | ICD-10-CM

## 2020-06-22 DIAGNOSIS — I482 Chronic atrial fibrillation, unspecified: Secondary | ICD-10-CM | POA: Diagnosis present

## 2020-06-22 DIAGNOSIS — I10 Essential (primary) hypertension: Secondary | ICD-10-CM | POA: Diagnosis present

## 2020-06-22 DIAGNOSIS — Z7901 Long term (current) use of anticoagulants: Secondary | ICD-10-CM | POA: Diagnosis not present

## 2020-06-22 DIAGNOSIS — B9689 Other specified bacterial agents as the cause of diseases classified elsewhere: Secondary | ICD-10-CM

## 2020-06-22 DIAGNOSIS — R1312 Dysphagia, oropharyngeal phase: Secondary | ICD-10-CM | POA: Diagnosis present

## 2020-06-22 DIAGNOSIS — E669 Obesity, unspecified: Secondary | ICD-10-CM | POA: Diagnosis present

## 2020-06-22 DIAGNOSIS — G9341 Metabolic encephalopathy: Secondary | ICD-10-CM | POA: Diagnosis present

## 2020-06-22 DIAGNOSIS — M1A9XX0 Chronic gout, unspecified, without tophus (tophi): Secondary | ICD-10-CM | POA: Diagnosis present

## 2020-06-22 DIAGNOSIS — F10231 Alcohol dependence with withdrawal delirium: Secondary | ICD-10-CM | POA: Diagnosis not present

## 2020-06-22 DIAGNOSIS — E785 Hyperlipidemia, unspecified: Secondary | ICD-10-CM | POA: Diagnosis present

## 2020-06-22 DIAGNOSIS — I5030 Unspecified diastolic (congestive) heart failure: Secondary | ICD-10-CM | POA: Diagnosis not present

## 2020-06-22 DIAGNOSIS — J189 Pneumonia, unspecified organism: Secondary | ICD-10-CM

## 2020-06-22 DIAGNOSIS — K1239 Other oral mucositis (ulcerative): Secondary | ICD-10-CM | POA: Diagnosis not present

## 2020-06-22 DIAGNOSIS — K449 Diaphragmatic hernia without obstruction or gangrene: Secondary | ICD-10-CM | POA: Diagnosis present

## 2020-06-22 DIAGNOSIS — R110 Nausea: Secondary | ICD-10-CM

## 2020-06-22 DIAGNOSIS — K921 Melena: Secondary | ICD-10-CM | POA: Diagnosis not present

## 2020-06-22 DIAGNOSIS — Z4659 Encounter for fitting and adjustment of other gastrointestinal appliance and device: Secondary | ICD-10-CM

## 2020-06-22 DIAGNOSIS — D62 Acute posthemorrhagic anemia: Secondary | ICD-10-CM | POA: Diagnosis not present

## 2020-06-22 DIAGNOSIS — Z515 Encounter for palliative care: Secondary | ICD-10-CM

## 2020-06-22 DIAGNOSIS — T45515A Adverse effect of anticoagulants, initial encounter: Secondary | ICD-10-CM | POA: Diagnosis not present

## 2020-06-22 DIAGNOSIS — Z01818 Encounter for other preprocedural examination: Secondary | ICD-10-CM

## 2020-06-22 DIAGNOSIS — Z972 Presence of dental prosthetic device (complete) (partial): Secondary | ICD-10-CM

## 2020-06-22 DIAGNOSIS — E8809 Other disorders of plasma-protein metabolism, not elsewhere classified: Secondary | ICD-10-CM | POA: Diagnosis present

## 2020-06-22 DIAGNOSIS — Z79899 Other long term (current) drug therapy: Secondary | ICD-10-CM

## 2020-06-22 DIAGNOSIS — Z9911 Dependence on respirator [ventilator] status: Secondary | ICD-10-CM | POA: Diagnosis not present

## 2020-06-22 DIAGNOSIS — R430 Anosmia: Secondary | ICD-10-CM | POA: Diagnosis present

## 2020-06-22 HISTORY — DX: COVID-19: U07.1

## 2020-06-22 LAB — D-DIMER, QUANTITATIVE: D-Dimer, Quant: 0.67 ug/mL-FEU — ABNORMAL HIGH (ref 0.00–0.50)

## 2020-06-22 LAB — BLOOD GAS, VENOUS
Acid-Base Excess: 2.7 mmol/L — ABNORMAL HIGH (ref 0.0–2.0)
Bicarbonate: 27.1 mmol/L (ref 20.0–28.0)
Drawn by: 1390
FIO2: 21
O2 Saturation: 26.3 %
Patient temperature: 37
pCO2, Ven: 44.7 mmHg (ref 44.0–60.0)
pH, Ven: 7.4 (ref 7.250–7.430)
pO2, Ven: <31 mmHg — CL (ref 32.0–45.0)

## 2020-06-22 LAB — CBC WITH DIFFERENTIAL/PLATELET
Abs Immature Granulocytes: 0.2 10*3/uL — ABNORMAL HIGH (ref 0.00–0.07)
Basophils Absolute: 0.1 10*3/uL (ref 0.0–0.1)
Basophils Relative: 0 %
Eosinophils Absolute: 0.1 10*3/uL (ref 0.0–0.5)
Eosinophils Relative: 1 %
HCT: 39.5 % (ref 39.0–52.0)
Hemoglobin: 13.2 g/dL (ref 13.0–17.0)
Immature Granulocytes: 1 %
Lymphocytes Relative: 6 %
Lymphs Abs: 1 10*3/uL (ref 0.7–4.0)
MCH: 34.3 pg — ABNORMAL HIGH (ref 26.0–34.0)
MCHC: 33.4 g/dL (ref 30.0–36.0)
MCV: 102.6 fL — ABNORMAL HIGH (ref 80.0–100.0)
Monocytes Absolute: 0.9 10*3/uL (ref 0.1–1.0)
Monocytes Relative: 5 %
Neutro Abs: 15 10*3/uL — ABNORMAL HIGH (ref 1.7–7.7)
Neutrophils Relative %: 87 %
Platelets: 283 10*3/uL (ref 150–400)
RBC: 3.85 MIL/uL — ABNORMAL LOW (ref 4.22–5.81)
RDW: 14 % (ref 11.5–15.5)
WBC: 17.3 10*3/uL — ABNORMAL HIGH (ref 4.0–10.5)
nRBC: 0 % (ref 0.0–0.2)

## 2020-06-22 LAB — COMPREHENSIVE METABOLIC PANEL
ALT: 20 U/L (ref 0–44)
AST: 31 U/L (ref 15–41)
Albumin: 2.5 g/dL — ABNORMAL LOW (ref 3.5–5.0)
Alkaline Phosphatase: 55 U/L (ref 38–126)
Anion gap: 15 (ref 5–15)
BUN: 23 mg/dL (ref 8–23)
CO2: 24 mmol/L (ref 22–32)
Calcium: 8.4 mg/dL — ABNORMAL LOW (ref 8.9–10.3)
Chloride: 95 mmol/L — ABNORMAL LOW (ref 98–111)
Creatinine, Ser: 1.34 mg/dL — ABNORMAL HIGH (ref 0.61–1.24)
GFR, Estimated: 54 mL/min — ABNORMAL LOW (ref >60)
Glucose, Bld: 114 mg/dL — ABNORMAL HIGH (ref 70–99)
Potassium: 3.2 mmol/L — ABNORMAL LOW (ref 3.5–5.1)
Sodium: 134 mmol/L — ABNORMAL LOW (ref 135–145)
Total Bilirubin: 1.5 mg/dL — ABNORMAL HIGH (ref 0.3–1.2)
Total Protein: 6.6 g/dL (ref 6.5–8.1)

## 2020-06-22 LAB — BASIC METABOLIC PANEL
Anion gap: 12 (ref 5–15)
BUN: 22 mg/dL (ref 8–23)
CO2: 24 mmol/L (ref 22–32)
Calcium: 8 mg/dL — ABNORMAL LOW (ref 8.9–10.3)
Chloride: 99 mmol/L (ref 98–111)
Creatinine, Ser: 1.34 mg/dL — ABNORMAL HIGH (ref 0.61–1.24)
GFR, Estimated: 54 mL/min — ABNORMAL LOW (ref >60)
Glucose, Bld: 147 mg/dL — ABNORMAL HIGH (ref 70–99)
Potassium: 3.7 mmol/L (ref 3.5–5.1)
Sodium: 135 mmol/L (ref 135–145)

## 2020-06-22 LAB — AMMONIA: Ammonia: 11 umol/L (ref 9–35)

## 2020-06-22 LAB — RESPIRATORY PANEL BY RT PCR (FLU A&B, COVID)
Influenza A by PCR: NEGATIVE
Influenza B by PCR: NEGATIVE
SARS Coronavirus 2 by RT PCR: POSITIVE — AB

## 2020-06-22 LAB — PROCALCITONIN: Procalcitonin: 0.86 ng/mL

## 2020-06-22 LAB — TROPONIN I (HIGH SENSITIVITY): Troponin I (High Sensitivity): 130 ng/L (ref <18)

## 2020-06-22 LAB — FERRITIN: Ferritin: 66 ng/mL (ref 24–336)

## 2020-06-22 LAB — LACTIC ACID, PLASMA
Lactic Acid, Venous: 2.4 mmol/L (ref 0.5–1.9)
Lactic Acid, Venous: 1.7 mmol/L (ref 0.5–1.9)

## 2020-06-22 LAB — FIBRINOGEN: Fibrinogen: 450 mg/dL (ref 210–475)

## 2020-06-22 LAB — LACTATE DEHYDROGENASE: LDH: 292 U/L — ABNORMAL HIGH (ref 98–192)

## 2020-06-22 LAB — MAGNESIUM: Magnesium: 1 mg/dL — ABNORMAL LOW (ref 1.7–2.4)

## 2020-06-22 LAB — TRIGLYCERIDES: Triglycerides: 165 mg/dL — ABNORMAL HIGH (ref <150)

## 2020-06-22 LAB — C-REACTIVE PROTEIN: CRP: 11.1 mg/dL — ABNORMAL HIGH (ref <1.0)

## 2020-06-22 MED ORDER — ONDANSETRON HCL 4 MG/2ML IJ SOLN: 4.0000 mg | Freq: Four times a day (QID) | INTRAMUSCULAR | Status: DC | PRN

## 2020-06-22 MED ORDER — SODIUM CHLORIDE 0.9% FLUSH
3.0000 mL | Freq: Two times a day (BID) | INTRAVENOUS | Status: DC
  Administered 2020-06-23 – 2020-07-08 (×26): 3 mL via INTRAVENOUS

## 2020-06-22 MED ORDER — GUAIFENESIN-DM 100-10 MG/5ML PO SYRP: 10.0000 mL | ORAL_SOLUTION | ORAL | Status: DC | PRN

## 2020-06-22 MED ORDER — METOPROLOL SUCCINATE ER 50 MG PO TB24
50.0000 mg | ORAL_TABLET | Freq: Every evening | ORAL | Status: DC
  Administered 2020-06-23 – 2020-06-25 (×3): 50 mg via ORAL
  Filled 2020-06-22 (×4): qty 1

## 2020-06-22 MED ORDER — ALLOPURINOL 300 MG PO TABS
300.0000 mg | ORAL_TABLET | Freq: Every day | ORAL | Status: DC
  Administered 2020-06-22 – 2020-06-26 (×4): 300 mg via ORAL
  Filled 2020-06-22 (×5): qty 1

## 2020-06-22 MED ORDER — POTASSIUM CHLORIDE CRYS ER 20 MEQ PO TBCR
40.0000 meq | EXTENDED_RELEASE_TABLET | Freq: Once | ORAL | Status: DC
  Filled 2020-06-22: qty 2

## 2020-06-22 MED ORDER — SODIUM CHLORIDE 0.9 % IV SOLN: 500.0000 mg | INTRAVENOUS | Status: DC

## 2020-06-22 MED ORDER — ONDANSETRON HCL 4 MG PO TABS: 4.0000 mg | ORAL_TABLET | Freq: Four times a day (QID) | ORAL | Status: DC | PRN

## 2020-06-22 MED ORDER — FAMOTIDINE 20 MG PO TABS: 40.0000 mg | ORAL_TABLET | Freq: Every day | ORAL | Status: DC | PRN

## 2020-06-22 MED ORDER — SODIUM CHLORIDE 0.9 % IV SOLN
100.0000 mg | Freq: Every day | INTRAVENOUS | Status: AC
  Administered 2020-06-23 – 2020-06-26 (×4): 100 mg via INTRAVENOUS
  Filled 2020-06-22 (×4): qty 20

## 2020-06-22 MED ORDER — SODIUM CHLORIDE 0.9 % IV SOLN
2.0000 g | INTRAVENOUS | Status: DC
  Administered 2020-06-22: 2 g via INTRAVENOUS
  Filled 2020-06-22: qty 20

## 2020-06-22 MED ORDER — HYDROCODONE-ACETAMINOPHEN 5-325 MG PO TABS: 1.0000 | ORAL_TABLET | ORAL | Status: DC | PRN

## 2020-06-22 MED ORDER — DEXAMETHASONE SODIUM PHOSPHATE 10 MG/ML IJ SOLN
6.0000 mg | INTRAMUSCULAR | Status: DC
  Administered 2020-06-23 – 2020-06-26 (×4): 6 mg via INTRAVENOUS
  Filled 2020-06-22 (×4): qty 1

## 2020-06-22 MED ORDER — ZINC SULFATE 220 (50 ZN) MG PO CAPS
220.0000 mg | ORAL_CAPSULE | Freq: Every day | ORAL | Status: DC
  Administered 2020-06-23 – 2020-06-26 (×4): 220 mg via ORAL
  Filled 2020-06-22 (×6): qty 1

## 2020-06-22 MED ORDER — SODIUM CHLORIDE 0.9 % IV SOLN
500.0000 mg | INTRAVENOUS | Status: DC
  Administered 2020-06-22: 500 mg via INTRAVENOUS
  Filled 2020-06-22 (×2): qty 500

## 2020-06-22 MED ORDER — SODIUM CHLORIDE 0.9 % IV SOLN
100.0000 mg | INTRAVENOUS | Status: AC
  Administered 2020-06-22 (×2): 100 mg via INTRAVENOUS
  Filled 2020-06-22 (×2): qty 20

## 2020-06-22 MED ORDER — LACTATED RINGERS IV SOLN
INTRAVENOUS | Status: DC
  Administered 2020-06-23 – 2020-06-25 (×2): 1000 mL via INTRAVENOUS

## 2020-06-22 MED ORDER — ACETAMINOPHEN 325 MG PO TABS
650.0000 mg | ORAL_TABLET | Freq: Four times a day (QID) | ORAL | Status: DC | PRN
  Administered 2020-06-26: 650 mg via ORAL
  Filled 2020-06-22: qty 2

## 2020-06-22 MED ORDER — LACTATED RINGERS IV SOLN: INTRAVENOUS | Status: DC

## 2020-06-22 MED ORDER — ALPRAZOLAM 0.25 MG PO TABS
0.2500 mg | ORAL_TABLET | Freq: Three times a day (TID) | ORAL | Status: DC | PRN
  Administered 2020-06-24 – 2020-06-25 (×2): 0.25 mg via ORAL
  Filled 2020-06-22 (×3): qty 1

## 2020-06-22 MED ORDER — SODIUM CHLORIDE 0.9 % IV SOLN: INTRAVENOUS | Status: DC | PRN

## 2020-06-22 MED ORDER — RIVAROXABAN 20 MG PO TABS
20.0000 mg | ORAL_TABLET | Freq: Every day | ORAL | Status: DC
  Administered 2020-06-23 – 2020-06-26 (×4): 20 mg via ORAL
  Filled 2020-06-22 (×4): qty 1

## 2020-06-22 MED ORDER — SODIUM CHLORIDE 0.9 % IV BOLUS
500.0000 mL | Freq: Once | INTRAVENOUS | Status: AC
  Administered 2020-06-22: 500 mL via INTRAVENOUS

## 2020-06-22 MED ORDER — DEXAMETHASONE SODIUM PHOSPHATE 10 MG/ML IJ SOLN
10.0000 mg | Freq: Once | INTRAMUSCULAR | Status: AC
  Administered 2020-06-22: 10 mg via INTRAVENOUS
  Filled 2020-06-22: qty 1

## 2020-06-22 MED ORDER — ASCORBIC ACID 500 MG PO TABS
500.0000 mg | ORAL_TABLET | Freq: Every day | ORAL | Status: DC
  Administered 2020-06-23 – 2020-06-26 (×4): 500 mg via ORAL
  Filled 2020-06-22 (×5): qty 1

## 2020-06-22 MED ORDER — POTASSIUM CHLORIDE 10 MEQ/100ML IV SOLN
10.0000 meq | INTRAVENOUS | Status: AC
  Administered 2020-06-22 (×2): 10 meq via INTRAVENOUS
  Filled 2020-06-22 (×2): qty 100

## 2020-06-22 NOTE — Progress Notes (Signed)
CRITICAL VALUE ALERT  Critical Value:   Troponin = 130  Date & Time Notied:  06/22/2020 T 2340  Provider Notified: page to B. Chotiner.)  Orders Received/Actions taken:

## 2020-06-22 NOTE — ED Notes (Signed)
Attempted to have patient drink water prior to giving PO potassium. Pt immediately coughed after swallowing water. Provider notified, keeping pt NPO, changing potassium to IV

## 2020-06-22 NOTE — ED Notes (Signed)
Pt O2 drops to 86% while sleeping, placed on 1L O2 via [place] with improvement to 99% while asleep

## 2020-06-22 NOTE — Progress Notes (Signed)
Called by RN that troponin elevated at 130.  RN unsure why or who ordered troponin was ordered and pt has not complained of chest pain.  It appears was ordered at admit.  Pt confused and no complaints of chest pain. Pt is hemodynamically stable.  Is anticoagulated on Xarelto. Check serial troponin levels. Check EKG now.  Continue to monitor

## 2020-06-22 NOTE — ED Notes (Signed)
Patient assessed for questionable low sat. Upon placing probe on ear, RA SAT 94-96%. MD at bedside and said hold off on o2 for the moment.

## 2020-06-22 NOTE — ED Triage Notes (Signed)
Pt tested positive for Covid 10 days ago.  No fever.  No vomiting.  Has had mild sore throat and cough, but is not eating or drinking.  Family brought  Him to Ed for dehydration and lack of intake.  Pt is confused and had not been previously.

## 2020-06-22 NOTE — H&P (Signed)
Gregory Wheeler CLE:[id number] DOB: [birth year] DOA: 06/22/2020    PCP: Jacqualine Mau, NP   Outpatient Specialists:   CARDS:   Dr. [place]   Patient arrived to ER on 06/22/20 at 1031 Referred by Attending Gertha Calkin, MD   Patient coming from: home Lives   With family    Chief Complaint:  Chief Complaint  Patient presents with  . Covid + - Not eating or drinking    HPI: Gregory Wheeler is a 79 y.o. male with medical history significant of Spondylosis, gout, GERD, HTn, p A.fib, iron defficiency anemia, CAD, HLD, right bundle branch block, history of small bowel obstruction, history of second-degree AV block Mobitz type I    Presented with   Confusion from home.  Patient tested positive for Covid 10 days ago has not had any fever vomiting but has had decreased p.o. intake.  Reports some sore throat and cough not eating and drinking.  Family brought him to emergency department because they were concerned about dehydration and confusion. No confusion at baseline Apparently fully vaccinated in March. Patient did endorse some diarrhea.  He used to be drinking heavy but came off for the past 2 weeks His wife passed away 2 years ago and he started to drink very heavy  his last EtOH was 4 days and it was half a beer  Patient did have a small fall today but family states no head trauma Although fall was unwitnessed  Infectious risk factors:  Reports, anosmia/change in taste, severe fatigue    KNOWN COVID POSITIVE   Has  been vaccinated against COVID April Pfizer   Initial COVID TEST   POSITIVE,   Lab Results  Component Value Date   SARSCOV2NAA POSITIVE (A) 06/22/2020   Regarding pertinent Chronic problems:     Gout on allopurinol   HTN on Toprol    obesity-   BMI Readings from Last 1 Encounters:  06/22/20 34.90 kg/m      A. Fib -  - CHA2DS2 vas score     4      current  on anticoagulation with Xarelto           -  Rate control:  Currently controlled with  Toprolol    While in ER: Noted to be hypoxic down to 83% Lactic acid 2.4 Potassium down to 3.2 Elevated white blood cell count up to 17.3 Started on antibiotics for community-acquired pneumonia also given a dose of remdesivir and Decadron Attempted to give p.o. while in the hot ER but patient started to cough as soon as he swallowed some water and was made n.p.o. potassium was changed to IV  Hospitalist was called for admission for Covid pneumonia and hypoxia  The following Work up has been ordered so far:  Orders Placed This Encounter  Procedures  . Respiratory Panel by RT PCR (Flu A&B, Covid) - Nasopharyngeal Swab  . Blood Culture (routine x 2)  . Urine culture  . DG Chest Port 1 View  . Lactic acid, plasma  . CBC WITH DIFFERENTIAL  . Comprehensive metabolic panel  . D-dimer, quantitative  . Procalcitonin  . Lactate dehydrogenase  . Ferritin  . Triglycerides  . Fibrinogen  . C-reactive protein  . Urinalysis, Routine w reflex microscopic Urine, Clean Catch  . Diet NPO time specified  . Cardiac monitoring  . Insert peripheral IV x 2  . Initiate Carrier Fluid Protocol  . Place surgical mask on patient  . Patient to  wear surgical mask during transportation  . Assess patient for ability to self-prone. If able (can move self in bed, ambulate) and stable (SpO2 and oxygen requirement):  . RN/NT - Document specific oxygen requirements in CHL  . Notify EDP if new oxygen requirements escalates > 4L per minute [place]  . RN to draw the following extra tubes:  . remdesivir per pharmacy consult  . Consult to hospitalist  ALL PATIENTS BEING ADMITTED/HAVING PROCEDURES NEED COVID-19 SCREENING  . pharmacy consult  . Airborne and Contact precautions  . Pulse oximetry, continuous  . ED EKG 12-Lead  . Admit to Inpatient (patient's expected length of stay will be greater than 2 midnights or inpatient only procedure)    Following Medications were ordered in ER: Medications  remdesivir 100 mg  in sodium chloride 0.9 % 100 mL IVPB (has no administration in time range)  lactated ringers infusion ( Intravenous New Bag/Given 06/22/20 1524)  0.9 %  sodium chloride infusion (0 mLs Intravenous Stopped 06/22/20 1709)  cefTRIAXone (ROCEPHIN) 2 g in sodium chloride 0.9 % 100 mL IVPB (0 g Intravenous Stopped 06/22/20 1519)  azithromycin (ZITHROMAX) 500 mg in sodium chloride 0.9 % 250 mL IVPB (0 mg Intravenous Stopped 06/22/20 1629)  potassium chloride 10 mEq in 100 mL IVPB (10 mEq Intravenous New Bag/Given 06/22/20 1626)  sodium chloride 0.9 % bolus 500 mL (0 mLs Intravenous Stopped 06/22/20 1259)  dexamethasone (DECADRON) injection 10 mg (10 mg Intravenous Given 06/22/20 1403)  remdesivir 100 mg in sodium chloride 0.9 % 100 mL IVPB (0 mg Intravenous Stopped 06/22/20 1518)        Consult Orders  (From admission, onward)         Start     Ordered   06/22/20 1338  Consult to hospitalist  ALL PATIENTS BEING ADMITTED/HAVING PROCEDURES NEED COVID-19 SCREENING- Spoke with Doug at 142pm.  Once       Comments: ALL PATIENTS BEING ADMITTED/HAVING PROCEDURES NEED COVID-19 SCREENING  Provider:  (Not yet assigned)  Question Answer Comment  Place call to: [facility]   Reason for Consult Admit      06/22/20 1337           Significant initial  Findings: Abnormal Labs Reviewed  RESPIRATORY PANEL BY RT PCR (FLU A&B, COVID) - Abnormal; Notable for the following components:      Result Value   SARS Coronavirus 2 by RT PCR POSITIVE (*)    All other components within normal limits  LACTIC ACID, PLASMA - Abnormal; Notable for the following components:   Lactic Acid, Venous 2.4 (*)    All other components within normal limits  CBC WITH DIFFERENTIAL/PLATELET - Abnormal; Notable for the following components:   WBC 17.3 (*)    RBC 3.85 (*)    MCV 102.6 (*)    MCH 34.3 (*)    Neutro Abs 15.0 (*)    Abs Immature Granulocytes 0.20 (*)    All other components within normal limits   COMPREHENSIVE METABOLIC PANEL - Abnormal; Notable for the following components:   Sodium 134 (*)    Potassium 3.2 (*)    Chloride 95 (*)    Glucose, Bld 114 (*)    Creatinine, Ser 1.34 (*)    Calcium 8.4 (*)    Albumin 2.5 (*)    Total Bilirubin 1.5 (*)    GFR, Estimated 54 (*)    All other components within normal limits  D-DIMER, QUANTITATIVE (NOT AT [facility]) - Abnormal; Notable for the following components:  D-Dimer, Quant 0.67 (*)    All other components within normal limits  LACTATE DEHYDROGENASE - Abnormal; Notable for the following components:   LDH 292 (*)    All other components within normal limits  TRIGLYCERIDES - Abnormal; Notable for the following components:   Triglycerides 165 (*)    All other components within normal limits  C-REACTIVE PROTEIN - Abnormal; Notable for the following components:   CRP 11.1 (*)    All other components within normal limits    Otherwise labs showing:    Recent Labs  Lab 06/22/20 1143  NA 134*  K 3.2*  CO2 24  GLUCOSE 114*  BUN 23  CREATININE 1.34*  CALCIUM 8.4*    Cr   Up from baseline see below Lab Results  Component Value Date   CREATININE 1.34 (H) 06/22/2020   CREATININE 1.03 02/06/2019   CREATININE 0.98 06/20/2018    Recent Labs  Lab 06/22/20 1143  AST 31  ALT 20  ALKPHOS 55  BILITOT 1.5*  PROT 6.6  ALBUMIN 2.5*   Lab Results  Component Value Date   CALCIUM 8.4 (L) 06/22/2020   WBC      Component Value Date/Time   WBC 17.3 (H) 06/22/2020 1143   LYMPHSABS 1.0 06/22/2020 1143   LYMPHSABS 1.7 03/09/2019 0828   LYMPHSABS 1.4 11/04/2008 1516   MONOABS 0.9 06/22/2020 1143   MONOABS 0.7 11/04/2008 1516   EOSABS 0.1 06/22/2020 1143   EOSABS 0.1 03/09/2019 0828   BASOSABS 0.1 06/22/2020 1143   BASOSABS 0.1 03/09/2019 0828   BASOSABS 0.0 11/04/2008 1516   Plt: Lab Results  Component Value Date   PLT 283 06/22/2020   Lactic Acid, Venous    Component Value Date/Time   LATICACIDVEN 1.7 06/22/2020 1405      Procalcitonin 0.8   COVID-19 Labs  Recent Labs    06/22/20 1143 06/22/20 1144  DDIMER 0.67*  --   FERRITIN  --  66  LDH 292*  --   CRP  --  11.1*    Lab Results  Component Value Date   SARSCOV2NAA POSITIVE (A) 06/22/2020    Venous  Blood Gas result: Ordered   HG/HCT   Up from baseline see below    Component Value Date/Time   HGB 13.2 06/22/2020 1143   HGB 11.6 (L) 03/09/2019 0828   HGB 13.8 11/04/2008 1516   HCT 39.5 06/22/2020 1143   HCT 35.8 (L) 03/09/2019 0828   HCT 39.6 11/04/2008 1516   MCV 102.6 (H) 06/22/2020 1143   MCV 92 03/09/2019 0828   MCV 98.8 (H) 11/04/2008 1516      Troponin  ordered     ECG: Ordered Personally reviewed by me showing: HR : 84 Rhythm:   RBBB,   no evidence of ischemic changes QTC 477    UA Pending      CXR -bilateral pneumonia consistent with Covid     ED Triage Vitals  Enc Vitals Group     BP 06/22/20 1058 (!) 109/94     Pulse Rate 06/22/20 1058 95     Resp 06/22/20 1058 20     Temp 06/22/20 1058 98.4 F (36.9 C)     Temp Source 06/22/20 1058 Oral     SpO2 06/22/20 1058 (!) 83 %     Weight --      Height 06/22/20 1100 5\' 10"  (1.778 m)     Head Circumference --      Peak Flow --  Pain Score 06/22/20 1101 0     Pain Loc --      Pain Edu? --      Excl. in GC? --   TMAX(24)@       Latest  Blood pressure (!) 152/83, pulse 75, temperature 98.1 F (36.7 C), temperature source Oral, resp. rate 19, height 5\' 10"  (1.778 m), SpO2 94 %.     Review of Systems:    Pertinent positives include:   fatigue, Sore throat, diarrhea,  non-productive cough confusion Constitutional:  No weight loss, night sweats, Fevers, chillsweight loss  HEENT:  No headaches, Difficulty swallowing,Tooth/dental problems, No sneezing, itching, ear ache, nasal congestion, post nasal drip,  Cardio-vascular:  No chest pain, Orthopnea, PND, anasarca, dizziness, palpitations.no Bilateral lower extremity swelling  GI:  No heartburn,  indigestion, abdominal pain, nausea, vomiting,  change in bowel habits, loss of appetite, melena, blood in stool, hematemesis Resp:  no shortness of breath at rest. No dyspnea on exertion, No excess mucus, no productive cough, No, No coughing up of blood.No change in color of mucus.No wheezing. Skin:  no rash or lesions. No jaundice GU:  no dysuria, change in color of urine, no urgency or frequency. No straining to urinate.  No flank pain.  Musculoskeletal:  No joint pain or no joint swelling. No decreased range of motion. No back pain.  Psych:  No change in mood or affect. No depression or anxiety. No memory loss.  Neuro: no localizing neurological complaints, no tingling, no weakness, no double vision, no gait abnormality, no slurred speech, no   All systems reviewed and apart from HOPI all are negative  Past Medical History:   Past Medical History:  Diagnosis Date  . Arthritis   . Atrial fibrillation (HCC) 01/31/2015  . Chronic gout   . Coronary artery disease 12/2007   STATUS POST STENTING OF THE LEFT ANTERIOR DESCENDING ARTERY  . COVID-19   . Dyslipidemia   . Gout   . Hiatal hernia   . Hyperlipidemia   . Hypertension   . LBP (low back pain)   . Partial small bowel obstruction (HCC)   . RBBB (right bundle branch block)   . SBO (small bowel obstruction) (HCC)    PARTIAL  . Second degree AV block, Mobitz type I   . Urinary tract infection    Escherichia coli urinary tract infection      Past Surgical History:  Procedure Laterality Date  . CARDIAC CATHETERIZATION    . HERNIA REPAIR     INGUINAL  . ROTATOR CUFF REPAIR      Social History:  Ambulatory   independently       reports that he quit smoking about 29 years ago. He has never used smokeless tobacco. He reports current alcohol use. He reports that he does not use drugs.   Family History:   Family History  Problem Relation Age of Onset  . Emphysema Mother 25  . Heart attack Father 64    Allergies: No  Known Allergies   Prior to Admission medications   Medication Sig Start Date End Date Taking? Authorizing Provider  allopurinol (ZYLOPRIM) 300 MG tablet Take 1 tablet (300 mg total) by mouth daily. 12/01/18   01/31/19, Peter M, MD  ALPRAZolam [place]) 0.25 MG tablet Take 1 tablet (0.25 mg total) by mouth 3 (three) times daily as needed for anxiety. 07/03/18   13/7/19, Peter M, MD  escitalopram (LEXAPRO) 10 MG tablet Take 1 tablet by mouth daily. 01/27/19  [provider]  folic acid (FOLVITE) 1 MG tablet Take 2 tablets by mouth daily. 01/21/19   [provider]  iron polysaccharides (NIFEREX) 150 MG capsule Take 1 capsule (150 mg total) by mouth daily. 09/17/19   [place], Peter M, MD  metoprolol succinate (TOPROL-XL) 50 MG 24 hr tablet TAKE 1 TABLET DAILY. TAKE WITH OR IMMEDIATELY FOLLOWING A MEAL. 09/16/19   [place], Peter M, MD  rivaroxaban (XARELTO) 20 MG TABS tablet Take 1 tablet (20 mg total) by mouth daily with supper. 03/14/20   [place], Peter M, MD   Physical Exam: Vitals with BMI 06/22/2020 06/22/2020 06/22/2020  Height - - -  Weight - - -  BMI - - -  Systolic 152 129 161  Diastolic 83 68 80  Pulse 75 75 76     1. General:  in No  Acute distress    Chronically ill  -appearing 2. Psychological: Alert and   Oriented 3. Head/ENT: Dry Mucous Membranes                          Head Non traumatic, neck supple                           Poor Dentition 4. SKIN:  decreased Skin turgor,  Skin clean Dry and intact no rash 5. Heart: Regular rate and rhythm no  Murmur, no Rub or gallop 6. Lungs:  , no wheezes or crackles   7. Abdomen: Soft,  non-tender, Non distended  Obese bowel sounds present 8. Lower extremities: no clubbing, cyanosis, no edema 9. Neurologically Grossly intact, moving all 4 extremities equally  10. MSK: Normal range of motion All other LABS:     Recent Labs  Lab 06/22/20 1143  WBC 17.3*  NEUTROABS 15.0*  HGB 13.2  HCT 39.5  MCV 102.6*  PLT 283      Recent Labs  Lab 06/22/20 1143  NA 134*  K 3.2*  CL 95*  CO2 24  GLUCOSE 114*  BUN 23  CREATININE 1.34*  CALCIUM 8.4*     Recent Labs  Lab 06/22/20 1143  AST 31  ALT 20  ALKPHOS 55  BILITOT 1.5*  PROT 6.6  ALBUMIN 2.5*   Cultures:    Component Value Date/Time   SDES BLOOD RIGHT ARM 02/03/2015 2044   SDES BLOOD LEFT ARM 02/03/2015 2044   SPECREQUEST BOTTLES DRAWN AEROBIC AND ANAEROBIC 5CC 02/03/2015 2044   SPECREQUEST BOTTLES DRAWN AEROBIC AND ANAEROBIC 5CC 02/03/2015 2044   CULT  02/03/2015 2044    NO GROWTH 5 DAYS Performed at [facility]    CULT  02/03/2015 2044    NO GROWTH 5 DAYS Performed at [facility]    REPTSTATUS 02/10/2015 FINAL 02/03/2015 2044   REPTSTATUS 02/10/2015 FINAL 02/03/2015 2044     Radiological Exams on Admission: DG Chest Port 1 View  Result Date: 06/22/2020 CLINICAL DATA:  COVID, altered mental status. EXAM: PORTABLE CHEST 1 VIEW COMPARISON:  10/19/2014. FINDINGS: Low lung volumes. Subtle airspace opacities in the peripheral lung bases. No visible pleural effusions or pneumothorax. Cardiomediastinal silhouette is within normal limits. No evidence of acute osseous abnormality. IMPRESSION: Subtle airspace opacities in the peripheral lung bases, concerning for multifocal pneumonia and consistent with reported COVID diagnosis. Electronically Signed   By: Feliberto Harts MD   On: 06/22/2020 11:39    Chart has been reviewed    Assessment/Plan  79 y.o. male with medical history significant of Spondylosis, gout, GERD, HTn, p A.fib, iron defficiency anemia, CAD, HLD, right bundle branch block, history of small bowel obstruction, history of second-degree AV block Mobitz type I Admitted for COVID Pneumonia  Present on Admission: . Pneumonia due to COVID-19 virus -    FROM HOME  WITH KNOWN HX OF COVID19 ER Novel Corona Virus testing:  Ordered 06/22/20 and is  positive  Immunization status: fully vaccinated would like to  have a booster when able   Following concerning LAB/ imaging findings:    ANC/ALC ratio>3.5 BMP: increased BUN/Cr     CRP, LDH: increased      CXR: hazy bilateral peripheral opacities     -Following work-up initiated:      sputum cultures  Ordered 06/22/20, Blood cultures   Ordered 06/22/20,   Following complications noted:  evidence of AKI - will provide gentle rehydration    Diarrhea , decreased PO intake resulting in dehydration - will rehydrate  acute respiratory failure with hypoxia - continue oxygen treatment  Plan of treatment: Admit on Airborn Precautions  -given severity of illness initiate steroids Decadron 6mg  q 24 hours And pharmacy consult for remdesivir   - Will follow daily d.dimer on Xarelto  - Assess for ability to prone  - Supportive management -Fluid sparing resuscitation  -Provide oxygen as needed currently on    SpO2: 93 % O2 Flow Rate (L/min): 1 L/min   -Initiate antibiotics  given evidence worrisome for superinfection  - Consult PCCM if becomes respiratory unstable   Poor Prognostic factors  79 y.o.  Personal hx of  CAD, , HTN, obesity,   Evidence of  organ damage  Present AKI,   tachypnea, tachycardia present on admission  ABS neutrophil to lymphocyte ratio >3.5 Risk factors for Cytokine storm CXR GGO   CRP >4.6 and  ANC>11.4 abnormal elctolytes    Patient is fully vaccinated hopefully improving the prognosis  Will order Airborne and Contact precautions  Family/ patient prognosis discussion: I have discussed case with the family/ patient  who are aware of their prognosis At this point they would like patient to be full code     The treatment plan and use of medications and known side effects were discussed with  family. It was clearly explained that there is no proven definitive treatment for COVID-19 infection yet. Any medications used here are based on case reports/anecdotal data which are not peer-reviewed and has not been studied using  randomized control trials.  Complete risks and long-term side effects are unknown, however in the best clinical judgment they seem to be of some clinical benefit rather than medical risks. family agree with the treatment plan and want to receive these treatments as indicated.      Acute encephalopathy -   - most likely multifactorial secondary to combination of   infection   mild dehydration secondary to decreased by mouth intake,    - Will rehydrate   - treat underlining infection   - monitor for signs of Etoh or Benzo withdraw  - if no improvement may need further imaging to evaluate for CNS pathology pathology such as MRI of the brain   - neurological exam appears to be nonfocal but patient unable to cooperate fully   - VBG pending   - ammonia ordered Family stated patient had a fall which was unwitnessed will obtain CT head as patient is on Xarelto  . RBBB (right bundle branch block) -chronic stable   .  Hypertension -continue beta-blocker   . Coronary artery disease -chronic stable continue beta-blocker patient does not seem to be on a statin.   . Chronic atrial fibrillation (HCC) -continue Xarelto and beta-blocker when able to tolerate p.o.   . Alcohol abuse -Per family patient has undergone detox at home and has drastically diminished she has EtOH use in the past 2 weeks last drink was on a half a beer 4 days ago. Patient been too sick from Covid to drink. Monitor for any signs of withdrawal Transitional care coordinator consult for EtOH. Abuse   . AKI (acute kidney injury) (HCC) -we will rehydrate most likely secondary to dehydration obtain urine electrolytes  . Dehydration -gently rehydrate being mindful of Covid positive   . Acute respiratory failure due to COVID-19 [facility]) -mild continue to monitor prone if able to tolerate continuous pulse ox continue oxygen treatment  . Hypokalemia - - will replace and repeat in AM,  check magnesium level and replace as  needed  Hypoalbuminemia well have checked prealbumin and have nutrition assessment  Coughing with eating. Will have speech pathology evaluate dysphagia diet for now aspiration precautions  Possible urinary retention patient has not urinated despite getting IV fluids will obtain bladder scan if Needed Place, Foley, would likely benefit from Foley catheter for monitoring of urine output  Possible secondary bacterial pneumonia. We will continue Rocephin and azithromycin obtain sputum cultures  Debility will have PT OT evaluate prior to discharge other plan as per orders.  DVT prophylaxis:  xanax  Code Status:    Code Status: Prior FULL CODE as per  family  I had personally discussed CODE STATUS with patient and family    Family Communication:   Family not at  Bedside  plan of care was discussed on the phone with  Son,   Disposition Plan:     To home once workup is complete and patient is stable   Following barriers for discharge:                            Electrolytes corrected                        white count improving able to transition to PO antibiotics                             Will need to be able to tolerate PO                            Will likely need home health, home O2, set up                                                  Would benefit from PT/OT eval prior to DC  Ordered                   Swallow eval - SLP ordered                                     Transition of care consulted  Nutrition    consulted                                   Consults called: none    Admission status:  ED Disposition    ED Disposition Condition Comment   Admit  Hospital Area: [facility] [[id number]]  Level of Care: Telemetry Medical [[id number]]  May admit patient to [facility] or [facility] if equivalent level of care is available:: Yes  Covid Evaluation: Confirmed COVID Positive  Diagnosis: Pneumonia due to COVID-19 virus [[id number]]  Admitting  Physician: Darrold Junker  Attending Physician: Darrold Junker  Estimated length of stay: 5 - 7 days  Certification:: I certify this patient will need inpatient services for at least 2 midnights          inpatient     I Expect 2 midnight stay secondary to severity of patient's current illness need for inpatient interventions justified by the following:  hemodynamic instability despite optimal treatment (tachycardia  tachypnea  hypoxia,  )   Severe lab/radiological/exam abnormalities including:   Covid positive hypokalemia pneumonia hypoxia and extensive comorbidities including:  substance abuse   CAD .  Chronic anticoagulation  That are currently affecting medical management.   I expect  patient to be hospitalized for 2 midnights requiring inpatient medical care.  Patient is at high risk for adverse outcome (such as loss of life or disability) if not treated.  Indication for inpatient stay as follows:  Severe change from baseline regarding mental status Hemodynamic instability despite maximal medical therapy,     inability to maintain oral hydration    New or worsening hypoxia  Need for IV antibiotics, IV fluids, antivirals    Level of care    tele   indefinitely please discontinue once patient no longer qualifies COVID-19 Labs     Lab Results  Component Value Date   SARSCOV2NAA POSITIVE (A) 06/22/2020     Precautions: admitted as   covid positive Airborne and Contact precautions    PPE: Used by the provider:   P100  eye Goggles,  Gloves  gown     Mahina Salatino 06/22/2020, 9:19 PM    [facility]     after 2 AM please page floor coverage PA If 7AM-7PM, please contact the day team taking care of the patient using [email or web address]   Patient was evaluated in the context of the global COVID-19 pandemic, which necessitated consideration that the patient might be at risk for infection with the SARS-CoV-2 virus that causes COVID-19.  Institutional protocols and algorithms that pertain to the evaluation of patients at risk for COVID-19 are in a state of rapid change based on information released by regulatory bodies including the CDC and federal and state organizations. These policies and algorithms were followed during the patient's care.

## 2020-06-22 NOTE — ED Notes (Signed)
Report given to [facility]. 

## 2020-06-22 NOTE — Progress Notes (Signed)
CRITICAL VALUE ALERT  Critical Value:  QO2 < 31.0  Date & Time Notied:  06/22/2020 at 2252  Provider Notified:page to B. Chotiner.)  Orders Received/Actions taken:

## 2020-06-22 NOTE — ED Notes (Signed)
Pt attempted to use urinal, does not have urge to urinate, did not produce sample

## 2020-06-22 NOTE — ED Provider Notes (Signed)
Emergency Department Provider Note   I have reviewed the triage vital signs and the nursing notes.   HISTORY  Chief Complaint Covid + - Not eating or drinking   HPI Gregory Wheeler is a 79 y.o. male with past medical history reviewed below presents to the emergency department with generalized weakness and confusion in the setting of known COVID-19 infection.  Patient was apparently diagnosed as an outpatient 1 week ago.  He has had approximately 10 days of symptoms.  Much of the history is provided by the patient's son at triage.  He has had decreased oral intake at home and EMS was actually called last night but ultimately decided not to transport.  When symptoms continue this morning he was brought in by his son.  Patient tells me he is just feeling cold and has no other complaints but is confused.   Level 5 caveat applies with encephalopathy.   Past Medical History:  Diagnosis Date  . Arthritis   . Atrial fibrillation (HCC) 01/31/2015  . Chronic gout   . Coronary artery disease 12/2007   STATUS POST STENTING OF THE LEFT ANTERIOR DESCENDING ARTERY  . COVID-19   . Dyslipidemia   . Gout   . Hiatal hernia   . Hyperlipidemia   . Hypertension   . LBP (low back pain)   . Partial small bowel obstruction (HCC)   . RBBB (right bundle branch block)   . SBO (small bowel obstruction) (HCC)    PARTIAL  . Second degree AV block, Mobitz type I   . Urinary tract infection    Escherichia coli urinary tract infection    Patient Active Problem List   Diagnosis Date Noted  . Pneumonia due to COVID-19 virus 06/22/2020  . 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 Jelani Vreeland term (current) use of anticoagulants [Z79.01] 02/22/2016  . Hypotension 12/21/2015  . Fatigue 12/21/2015  . Alcohol abuse 02/04/2015  . Infected prepatellar bursa 02/03/2015  . Effusion of right knee 02/03/2015  .  AKI (acute kidney injury) (HCC) 02/03/2015  . Chronic atrial fibrillation (HCC) 02/03/2015  . Knee effusion 02/03/2015  . Atrial fibrillation (HCC) 01/31/2015  . Gout 01/31/2015  . Second degree AV block, Mobitz type I 12/09/2013  . RBBB (right bundle branch block) 01/03/2011  . Hypertension   . Hyperlipidemia   . SBO (small bowel obstruction) (HCC)   . LBP (low back pain)   . Arthritis   . Gout   . Coronary artery disease 12/26/2007    Past Surgical History:  Procedure Laterality Date  . CARDIAC CATHETERIZATION    . HERNIA REPAIR     INGUINAL  . ROTATOR CUFF REPAIR      Allergies Patient has no known allergies.  Family History  Problem Relation Age of Onset  . Emphysema Mother [id number]  . Heart attack Father 2353    Social History Social History   Tobacco Use  . Smoking status: Former Smoker    Quit date: 08/27/1990    Years since quitting: 29.8  . Smokeless tobacco: Never Used  Vaping Use  . Vaping Use: Never used  Substance Use Topics  . Alcohol use: Yes    Comment: near daily  . Drug use: No    Review of Systems  Constitutional: No fever/chills. Positive fatigue and "feeling cold" Eyes: No visual changes. ENT: No sore throat. Cardiovascular: Denies chest pain. Respiratory:  Denies shortness of breath. Gastrointestinal: No abdominal pain.  No nausea, no vomiting.  No diarrhea.  No constipation. Genitourinary: Negative for dysuria. Musculoskeletal: Negative for back pain. Skin: Negative for rash. Neurological: Negative for headaches, focal weakness or numbness.  10-point ROS otherwise negative.  ____________________________________________   PHYSICAL EXAM:  VITAL SIGNS: ED Triage Vitals  Enc Vitals Group     BP 06/22/20 1058 (!) 109/94     Pulse Rate 06/22/20 1058 95     Resp 06/22/20 1058 20     Temp 06/22/20 1058 98.4 F (36.9 C)     Temp Source 06/22/20 1058 Oral     SpO2 06/22/20 1058 (!) 83 %     Weight --      Height 06/22/20 1100 5\' 10"   (1.778 m)   Constitutional: Alert with some mild confusion. No acute distress.  Eyes: Conjunctivae are normal.  Head: Atraumatic. Nose: No congestion/rhinnorhea. Mouth/Throat: Mucous membranes are slightly dry.  Neck: No stridor.   Cardiovascular: Normal rate, regular rhythm. Good peripheral circulation. Grossly normal heart sounds.   Respiratory: Normal respiratory effort.  No retractions. Lungs CTAB. Gastrointestinal: Soft and nontender. No distention.  Musculoskeletal: No lower extremity tenderness nor edema. No gross deformities of extremities. Neurologic:  Normal speech and language. No gross focal neurologic deficits are appreciated.  Skin:  Skin is warm, dry and intact. No rash noted.   ____________________________________________   LABS (all labs ordered are listed, but only abnormal results are displayed)  Labs Reviewed  RESPIRATORY PANEL BY RT PCR (FLU A&B, COVID) - Abnormal; Notable for the following components:      Result Value   SARS Coronavirus 2 by RT PCR POSITIVE (*)    All other components within normal limits  LACTIC ACID, PLASMA - Abnormal; Notable for the following components:   Lactic Acid, Venous 2.4 (*)    All other components within normal limits  CBC WITH DIFFERENTIAL/PLATELET - Abnormal; Notable for the following components:   WBC 17.3 (*)    RBC 3.85 (*)    MCV 102.6 (*)    MCH 34.3 (*)    Neutro Abs 15.0 (*)    Abs Immature Granulocytes 0.20 (*)    All other components within normal limits  COMPREHENSIVE METABOLIC PANEL - Abnormal; Notable for the following components:   Sodium 134 (*)    Potassium 3.2 (*)    Chloride 95 (*)    Glucose, Bld 114 (*)    Creatinine, Ser 1.34 (*)    Calcium 8.4 (*)    Albumin 2.5 (*)    Total Bilirubin 1.5 (*)    GFR, Estimated 54 (*)    All other components within normal limits  D-DIMER, QUANTITATIVE (NOT AT [facility]) - Abnormal; Notable for the following components:   D-Dimer, Quant 0.67 (*)    All other  components within normal limits  CULTURE, BLOOD (ROUTINE X 2)  CULTURE, BLOOD (ROUTINE X 2)  URINE CULTURE  LACTIC ACID, PLASMA  PROCALCITONIN  LACTATE DEHYDROGENASE  FERRITIN  TRIGLYCERIDES  FIBRINOGEN  C-REACTIVE PROTEIN  URINALYSIS, ROUTINE W REFLEX MICROSCOPIC   ____________________________________________  EKG  Rate: 84 PR: * QTc: 477  A-fib. RBBB w/ LAFB similar to prior. No STEMI. Single PVC noted.  ____________________________________________  RADIOLOGY  DG Chest Port 1 View  Result Date: 06/22/2020 CLINICAL DATA:  COVID, altered mental status. EXAM: PORTABLE CHEST 1 VIEW COMPARISON:  10/19/2014. FINDINGS: Low lung volumes. Subtle airspace opacities in the peripheral lung bases. No visible pleural effusions or  pneumothorax. Cardiomediastinal silhouette is within normal limits. No evidence of acute osseous abnormality. IMPRESSION: Subtle airspace opacities in the peripheral lung bases, concerning for multifocal pneumonia and consistent with reported COVID diagnosis. Electronically Signed   By: Feliberto Harts MD   On: 06/22/2020 11:39    ____________________________________________   PROCEDURES  Procedure(s) performed:   Procedures  CRITICAL CARE Performed by: Maia Plan Total critical care time: 35 minutes Critical care time was exclusive of separately billable procedures and treating other patients. Critical care was necessary to treat or prevent imminent or life-threatening deterioration. Critical care was time spent personally by me on the following activities: development of treatment plan with patient and/or surrogate as well as nursing, discussions with consultants, evaluation of patient's response to treatment, examination of patient, obtaining history from patient or surrogate, ordering and performing treatments and interventions, ordering and review of laboratory studies, ordering and review of radiographic studies, pulse oximetry and re-evaluation  of patient's condition.  Alona Bene, MD Emergency Medicine  ____________________________________________   INITIAL IMPRESSION / ASSESSMENT AND PLAN / ED COURSE  Pertinent labs & imaging results that were available during my care of the patient were reviewed by me and considered in my medical decision making (see chart for details).   Patient presents to the ED with COVID infection and AMS. Hypoxemia here but O2 probe changed and O2 95% on RA. No respiratory distress respiratory rate is increased slightly on my exam.  He has no outward sign of head trauma and has not had any falls.  No focal neurologic deficits.  COVID-19 encephalopathy likely explains his confusion at home.  Plan for preadmission Covid labs as well as gentle IV fluids and will follow chest x-ray. Patient has received COVID vaccine x 2 doses [employer]).   01:55 PM  Patient reevaluated and remains confused.  His labs are resulting showing mild hypokalemia and some very mild increase in his creatinine.  Continue gentle IV fluids and have replaced with p.o. potassium.  Chest x-ray showing early likely Covid pattern pneumonia.  Patient does have a significant leukocytosis as well as a mild elevated lactate.  Will start CAP abx in case there is some component of secondary bacterial infection causing these lab abnormalities.  Patient does not have SIRS vitals and I doubt sepsis.  Procalcitonin and other inflammatory markers are pending.  Blood and urine cultures ordered.  UA is pending. Discussed the above case with Dr. Allena Katz with the [facility] service.   Discussed patient's case with [facility], Dr. Allena Katz to request admission. Patient and family (if present) updated with plan. Care transferred to [facility] service.  I reviewed all nursing notes, vitals, pertinent old records, EKGs, labs, imaging (as available).    ____________________________________________  FINAL CLINICAL IMPRESSION(S) / ED DIAGNOSES  Final diagnoses:  Encephalopathy acute    COVID-19  Hypokalemia  Lactic acidosis    MEDICATIONS GIVEN DURING THIS VISIT:  Medications  dexamethasone (DECADRON) injection 10 mg (has no administration in time range)  remdesivir 100 mg in sodium chloride 0.9 % 100 mL IVPB (has no administration in time range)  remdesivir 100 mg in sodium chloride 0.9 % 100 mL IVPB (has no administration in time range)  lactated ringers infusion (has no administration in time range)  0.9 %  sodium chloride infusion (has no administration in time range)  potassium chloride SA (KLOR-CON) CR tablet 40 mEq (has no administration in time range)  cefTRIAXone (ROCEPHIN) 2 g in sodium chloride 0.9 % 100 mL IVPB (has no  administration in time range)  azithromycin (ZITHROMAX) 500 mg in sodium chloride 0.9 % 250 mL IVPB (has no administration in time range)  sodium chloride 0.9 % bolus 500 mL (0 mLs Intravenous Stopped 06/22/20 1259)    Note:  This document was prepared using Dragon voice recognition software and may include unintentional dictation errors.  Alona Bene, MD, [facility] Emergency Medicine    Nithila Sumners, Arlyss Repress, MD 06/22/20 [phone]

## 2020-06-22 NOTE — ED Notes (Signed)
Report given to nurse at [facility]

## 2020-06-22 NOTE — ED Notes (Signed)
Pt states he still does not need to urinate at this time, son reminded to call if he needs to urinate to obtain sample

## 2020-06-22 NOTE — Progress Notes (Signed)
Patient trasfered from [facility] to 5W09 via [facility]; alert and oriented x 2, confused; no complaints of pain; IV saline locked in LAC and fluids running in RAC; skin - redness blanchable on buttocks, abrasion on RLE and mid back. Orient patient to room and unit; gave patient care guide; instructed how to use the call bell and  fall risk precautions. Admission was called and informed that patient arrived on the unit. Will continue to monitor the patient.

## 2020-06-22 NOTE — Progress Notes (Signed)
Dr.long: Covid+ diagnosed 10 days ago, last Monday , lives with son. Pt has had diarrhea.   Very functional at baseline. Cough and sore throat. Confusion fatigue and stop eating.  Positive swab at [facility]. Wbc count of 17. Lactic is 2.4. Xray early covid pattern pneumonia. Pt is AMS and somnolent. Son reports pt has been confused x 2 days. Pt was treated for covid-19 conservatively. Pt is immunized.  2 pfizer shots.  RR 20 Inflammatory markers have been collected with cultures and sent out.  Creatinine is mildly high, Lab Results  Component Value Date   CREATININE 1.34 (H) 06/22/2020   CREATININE 1.03 02/06/2019   CREATININE 0.98 06/20/2018    Pt is not hypoxic. SpO2: 93 % on RA.  Blood pressure (!) 170/98, pulse 81, temperature 98.4 F (36.9 C), temperature source Oral, resp. rate (!) 25, height 5\' 10"  (1.778 m), SpO2 93 %.  No current facility-administered medications on file prior to encounter.   Current Outpatient Medications on File Prior to Encounter  Medication Sig Dispense Refill  . allopurinol (ZYLOPRIM) 300 MG tablet Take 1 tablet (300 mg total) by mouth daily. 90 tablet 3  . ALPRAZolam (XANAX) 0.25 MG tablet Take 1 tablet (0.25 mg total) by mouth 3 (three) times daily as needed for anxiety. 50 tablet 0  . escitalopram (LEXAPRO) 10 MG tablet Take 1 tablet by mouth daily.    . folic acid (FOLVITE) 1 MG tablet Take 2 tablets by mouth daily.    . iron polysaccharides (NIFEREX) 150 MG capsule Take 1 capsule (150 mg total) by mouth daily. 60 capsule 3  . metoprolol succinate (TOPROL-XL) 50 MG 24 hr tablet TAKE 1 TABLET DAILY. TAKE WITH OR IMMEDIATELY FOLLOWING A MEAL. 90 tablet 3  . rivaroxaban (XARELTO) 20 MG TABS tablet Take 1 tablet (20 mg total) by mouth daily with supper. 90 tablet 3

## 2020-06-23 ENCOUNTER — Inpatient Hospital Stay (HOSPITAL_COMMUNITY): Payer: Medicare Other

## 2020-06-23 DIAGNOSIS — I5030 Unspecified diastolic (congestive) heart failure: Secondary | ICD-10-CM

## 2020-06-23 DIAGNOSIS — E872 Acidosis: Secondary | ICD-10-CM | POA: Diagnosis not present

## 2020-06-23 DIAGNOSIS — J1282 Pneumonia due to coronavirus disease 2019: Secondary | ICD-10-CM

## 2020-06-23 DIAGNOSIS — G9340 Encephalopathy, unspecified: Secondary | ICD-10-CM | POA: Diagnosis not present

## 2020-06-23 DIAGNOSIS — U071 COVID-19: Secondary | ICD-10-CM | POA: Diagnosis not present

## 2020-06-23 DIAGNOSIS — E876 Hypokalemia: Secondary | ICD-10-CM | POA: Diagnosis not present

## 2020-06-23 LAB — BLOOD CULTURE ID PANEL (REFLEXED) - BCID2

## 2020-06-23 LAB — COMPREHENSIVE METABOLIC PANEL
ALT: 21 U/L (ref 0–44)
AST: 31 U/L (ref 15–41)
Albumin: 1.7 g/dL — ABNORMAL LOW (ref 3.5–5.0)
Alkaline Phosphatase: 48 U/L (ref 38–126)
Anion gap: 9 (ref 5–15)
BUN: 22 mg/dL (ref 8–23)
CO2: 23 mmol/L (ref 22–32)
Calcium: 7.9 mg/dL — ABNORMAL LOW (ref 8.9–10.3)
Chloride: 103 mmol/L (ref 98–111)
Creatinine, Ser: 1.22 mg/dL (ref 0.61–1.24)
GFR, Estimated: >60 mL/min (ref >60)
Glucose, Bld: 144 mg/dL — ABNORMAL HIGH (ref 70–99)
Potassium: 3.6 mmol/L (ref 3.5–5.1)
Sodium: 135 mmol/L (ref 135–145)
Total Bilirubin: 1.3 mg/dL — ABNORMAL HIGH (ref 0.3–1.2)
Total Protein: 5 g/dL — ABNORMAL LOW (ref 6.5–8.1)

## 2020-06-23 LAB — FERRITIN: Ferritin: 44 ng/mL (ref 24–336)

## 2020-06-23 LAB — CBC WITH DIFFERENTIAL/PLATELET
Abs Immature Granulocytes: 0.05 10*3/uL (ref 0.00–0.07)
Basophils Absolute: 0 10*3/uL (ref 0.0–0.1)
Basophils Relative: 0 %
Eosinophils Absolute: 0 10*3/uL (ref 0.0–0.5)
Eosinophils Relative: 0 %
HCT: 31.5 % — ABNORMAL LOW (ref 39.0–52.0)
Hemoglobin: 10.3 g/dL — ABNORMAL LOW (ref 13.0–17.0)
Immature Granulocytes: 1 %
Lymphocytes Relative: 8 %
Lymphs Abs: 0.5 10*3/uL — ABNORMAL LOW (ref 0.7–4.0)
MCH: 33.9 pg (ref 26.0–34.0)
MCHC: 32.7 g/dL (ref 30.0–36.0)
MCV: 103.6 fL — ABNORMAL HIGH (ref 80.0–100.0)
Monocytes Absolute: 0.2 10*3/uL (ref 0.1–1.0)
Monocytes Relative: 2 %
Neutro Abs: 5.8 10*3/uL (ref 1.7–7.7)
Neutrophils Relative %: 89 %
Platelets: 231 10*3/uL (ref 150–400)
RBC: 3.04 MIL/uL — ABNORMAL LOW (ref 4.22–5.81)
RDW: 13.9 % (ref 11.5–15.5)
WBC: 6.2 10*3/uL (ref 4.0–10.5)
nRBC: 0 % (ref 0.0–0.2)

## 2020-06-23 LAB — MRSA PCR SCREENING: MRSA by PCR: NEGATIVE

## 2020-06-23 LAB — ECHOCARDIOGRAM COMPLETE
Area-P 1/2: 3.21 cm2
Calc EF: 74.9 %
Height: 71 in
S' Lateral: 2.6 cm
Single Plane A2C EF: 77.8 %
Single Plane A4C EF: 70.1 %
Weight: 3915.37 oz

## 2020-06-23 LAB — D-DIMER, QUANTITATIVE: D-Dimer, Quant: 0.39 ug/mL-FEU (ref 0.00–0.50)

## 2020-06-23 LAB — TROPONIN I (HIGH SENSITIVITY)
Troponin I (High Sensitivity): 97 ng/L — ABNORMAL HIGH (ref <18)
Troponin I (High Sensitivity): 90 ng/L — ABNORMAL HIGH (ref <18)
Troponin I (High Sensitivity): 86 ng/L — ABNORMAL HIGH (ref <18)

## 2020-06-23 LAB — C-REACTIVE PROTEIN: CRP: 9 mg/dL — ABNORMAL HIGH (ref <1.0)

## 2020-06-23 LAB — PREALBUMIN: Prealbumin: <5 mg/dL — ABNORMAL LOW (ref 18–38)

## 2020-06-23 LAB — STREP PNEUMONIAE URINARY ANTIGEN: Strep Pneumo Urinary Antigen: NEGATIVE

## 2020-06-23 LAB — SODIUM, URINE, RANDOM: Sodium, Ur: 14 mmol/L

## 2020-06-23 LAB — MAGNESIUM: Magnesium: 1 mg/dL — ABNORMAL LOW (ref 1.7–2.4)

## 2020-06-23 LAB — PHOSPHORUS: Phosphorus: 3 mg/dL (ref 2.5–4.6)

## 2020-06-23 LAB — CREATININE, URINE, RANDOM: Creatinine, Urine: 236.96 mg/dL

## 2020-06-23 LAB — BRAIN NATRIURETIC PEPTIDE: B Natriuretic Peptide: 278.3 pg/mL — ABNORMAL HIGH (ref 0.0–100.0)

## 2020-06-23 LAB — PROCALCITONIN: Procalcitonin: 0.52 ng/mL

## 2020-06-23 MED ORDER — HYDRALAZINE HCL 20 MG/ML IJ SOLN
10.0000 mg | Freq: Once | INTRAMUSCULAR | Status: AC
  Administered 2020-06-23: 10 mg via INTRAVENOUS
  Filled 2020-06-23: qty 1

## 2020-06-23 MED ORDER — BOOST PLUS PO LIQD
237.0000 mL | Freq: Three times a day (TID) | ORAL | Status: DC
  Administered 2020-06-23 – 2020-06-26 (×9): 237 mL via ORAL
  Filled 2020-06-23 (×18): qty 237

## 2020-06-23 MED ORDER — MAGNESIUM SULFATE IN D5W 1-5 GM/100ML-% IV SOLN
1.0000 g | Freq: Once | INTRAVENOUS | Status: AC
  Administered 2020-06-23: 1 g via INTRAVENOUS
  Filled 2020-06-23: qty 100

## 2020-06-23 MED ORDER — SODIUM CHLORIDE 0.9 % IV SOLN
2.0000 g | INTRAVENOUS | Status: DC
  Administered 2020-06-23: 2 g via INTRAVENOUS
  Filled 2020-06-23 (×4): qty 2000

## 2020-06-23 MED ORDER — CHLORHEXIDINE GLUCONATE CLOTH 2 % EX PADS
6.0000 | MEDICATED_PAD | Freq: Every day | CUTANEOUS | Status: DC
  Administered 2020-06-23 – 2020-07-13 (×21): 6 via TOPICAL

## 2020-06-23 MED ORDER — SODIUM CHLORIDE 0.9 % IV SOLN
3.0000 g | Freq: Three times a day (TID) | INTRAVENOUS | Status: DC
  Administered 2020-06-23 – 2020-06-25 (×7): 3 g via INTRAVENOUS
  Filled 2020-06-23: qty 8
  Filled 2020-06-23: qty 3
  Filled 2020-06-23: qty 8
  Filled 2020-06-23: qty 3
  Filled 2020-06-23 (×2): qty 8
  Filled 2020-06-23: qty 3
  Filled 2020-06-23 (×2): qty 8

## 2020-06-23 MED ORDER — HYDRALAZINE HCL 20 MG/ML IJ SOLN
10.0000 mg | Freq: Four times a day (QID) | INTRAMUSCULAR | Status: DC | PRN
  Administered 2020-06-23 – 2020-06-28 (×4): 10 mg via INTRAVENOUS
  Filled 2020-06-23 (×5): qty 1

## 2020-06-23 MED ORDER — MAGNESIUM SULFATE 2 GM/50ML IV SOLN
2.0000 g | Freq: Once | INTRAVENOUS | Status: AC
  Administered 2020-06-23: 2 g via INTRAVENOUS
  Filled 2020-06-23: qty 50

## 2020-06-23 MED ORDER — THIAMINE HCL 100 MG/ML IJ SOLN
100.0000 mg | Freq: Every day | INTRAMUSCULAR | Status: DC
  Administered 2020-06-23 – 2020-06-24 (×2): 100 mg via INTRAVENOUS
  Filled 2020-06-23 (×2): qty 2

## 2020-06-23 MED ORDER — TAMSULOSIN HCL 0.4 MG PO CAPS
0.4000 mg | ORAL_CAPSULE | Freq: Every day | ORAL | Status: DC
  Administered 2020-06-23 – 2020-06-26 (×4): 0.4 mg via ORAL
  Filled 2020-06-23 (×4): qty 1

## 2020-06-23 MED ORDER — RESOURCE THICKENUP CLEAR PO POWD
ORAL | Status: DC | PRN
  Filled 2020-06-23: qty 125

## 2020-06-23 MED ORDER — FOLIC ACID 1 MG PO TABS
1.0000 mg | ORAL_TABLET | Freq: Every day | ORAL | Status: DC
  Administered 2020-06-23 – 2020-06-26 (×4): 1 mg via ORAL
  Filled 2020-06-23 (×5): qty 1

## 2020-06-23 MED ORDER — NITROGLYCERIN 2 % TD OINT
0.5000 [in_us] | TOPICAL_OINTMENT | Freq: Four times a day (QID) | TRANSDERMAL | Status: DC
  Administered 2020-06-23 – 2020-06-26 (×11): 0.5 [in_us] via TOPICAL
  Filled 2020-06-23: qty 30

## 2020-06-23 MED ORDER — CLONIDINE HCL 0.2 MG PO TABS
0.2000 mg | ORAL_TABLET | Freq: Once | ORAL | Status: DC
  Filled 2020-06-23: qty 1

## 2020-06-23 MED ORDER — KCL IN DEXTROSE-NACL 20-5-0.45 MEQ/L-%-% IV SOLN
INTRAVENOUS | Status: DC
  Filled 2020-06-23 (×2): qty 1000

## 2020-06-23 NOTE — Progress Notes (Addendum)
Foley 16 Fr was inserted per MD order due to acute urinary retention (410 ml at bladder scar). Patient tolerated well. Urine returned - 500 ml. Foley care and CHG bath provided per protocol.

## 2020-06-23 NOTE — Progress Notes (Signed)
Tele called stated ""Pt's HR went down to 37, had a pause of 0.21sec" Checked on Pt, he was stable, woke up from sleep

## 2020-06-23 NOTE — Progress Notes (Signed)
Pharmacy Antibiotic Note  Gregory Wheeler is a 79 y.o. male admitted on 06/22/2020 with COVID-19 infection, possible aspiration pneumonia and possible aspiration pneumonia.  Pharmacy has been consulted for Unasyn dosing.  ID is following.  10/27 BCx 4/4 with GPC's and BCID showed vancomycin sensitive enterococcus faecalis.  Unasyn will provide coverage for both aspiration pneumonia and enterococcus in the blood.    WBC decrease from 17.3 > 6.2 today, Scr improving, afebrile  Plan: Discontinue ampicillin  Initiate Unasyn 3g IV q8hours Monitor cultures, clinical improvement, future abx plans  Height: 5\' 11"  (180.3 cm) Weight: 111 kg (244 lb 11.4 oz) IBW/kg (Calculated) : 75.3  Temp (24hrs), Avg:98 F (36.7 C), Min:97.8 F (36.6 C), Max:98.1 F (36.7 C)  Recent Labs  Lab 06/22/20 1143 06/22/20 1405 06/22/20 2236 06/23/20 0140  WBC 17.3*  --   --  6.2  CREATININE 1.34*  --  1.34* 1.22  LATICACIDVEN 2.4* 1.7  --   --     Estimated Creatinine Clearance: 63.2 mL/min (by C-G formula based on SCr of 1.22 mg/dL).    No Known Allergies  Antimicrobials this admission: Azithromycin 10/27 >> 10/28 Ceftriaxone 10/27 >> 10/28 Ampicillin 10/28 > 10/28 Unasyn 10/28 >>   Microbiology results: 10/27 BCx: 4/4 GPC, BCID vanc sensitive enterococcus faecalis 10/28 MRSA PCR: neg  Thank you for allowing pharmacy to be a part of this patient's care.  11/28, PharmD PGY-1 Acute Care Pharmacy Resident Office: [phone] 06/23/2020 11:41 AM

## 2020-06-23 NOTE — Evaluation (Addendum)
Occupational Therapy Evaluation Patient Details Name: Gregory Wheeler MRN: [id number] DOB: [birth year] Today's Date: 06/23/2020    History of Present Illness 79 yo male presenting to ED with generalized weakness, poor food intake, and confusion. COVID positive. PMH including arthritis, A-fib, HTN, AKI, alcohol abuse, and CAD with s/p stenting of L anterior descending artery.    Clinical Impression   PTA, pt was living alone and performed BADLs; reports that his family assists with IADLs. Unsure of reliability of home information, PLOF, and support due to poor historian and confusion. Pt currently requiring Min Guard A for UB ADLs, Mod A for LB ADLs, and Min A for functional transfers. Pt presenting with decreased cognition, balance, strength, and activity tolerance. Pt would benefit from further acute OT to facilitate safe dc. Pending 24/7 support, recommend dc to home with HHOT for further OT to optimize safety, independence with ADLs, and return to PLOF.   Rest, HR 80s, SpO2 97% on RA, and RR 20s. With activity, HR 101, RR 30, SpO2 >88% on RA    Follow Up Recommendations  Home health OT;Supervision/Assistance - 24 hour (If family unable to provide 24/7 support, will need SNF)    Equipment Recommendations  3 in 1 bedside commode    Recommendations for Other Services PT consult     Precautions / Restrictions Precautions Precautions: Fall      Mobility Bed Mobility Overal bed mobility: Needs Assistance Bed Mobility: Supine to Sit     Supine to sit: Min assist     General bed mobility comments: Min A for pt to hold therapist's hand and pull into upright position    Transfers Overall transfer level: Needs assistance Equipment used: 1 person hand held assist Transfers: Sit to/from [employer] Sit to Stand: Min assist Stand pivot transfers: Min assist       General transfer comment: Min A to power up into standing and then maintain balance. Min A for balance  during pivot to recliner. Pt moving very cautiously and stating "these darn socks."    Balance Overall balance assessment: Needs assistance Sitting-balance support: No upper extremity supported;Feet supported Sitting balance-Leahy Scale: Fair     Standing balance support: During functional activity;Single extremity supported Standing balance-Leahy Scale: Poor Standing balance comment: Reliant on physical A                           ADL either performed or assessed with clinical judgement   ADL Overall ADL's : Needs assistance/impaired Eating/Feeding: NPO   Grooming: Set up;Sitting;Supervision/safety   Upper Body Bathing: Min guard;Sitting;Cueing for sequencing   Lower Body Bathing: Moderate assistance;Sit to/from stand   Upper Body Dressing : Min guard;Sitting   Lower Body Dressing: Moderate assistance;Sit to/from stand Lower Body Dressing Details (indicate cue type and reason): Mod A for dynamic balance Toilet Transfer: Minimal assistance;Stand-pivot           Functional mobility during ADLs: Minimal assistance (stand pivot only) General ADL Comments: Pt presenting with decreased cognition, balance, and activity tolerance.      Vision         Perception     Praxis      Pertinent Vitals/Pain Pain Assessment: No/denies pain     Hand Dominance     Extremity/Trunk Assessment Upper Extremity Assessment Upper Extremity Assessment: Overall WFL for tasks assessed   Lower Extremity Assessment Lower Extremity Assessment: Defer to PT evaluation   Cervical / Trunk Assessment Cervical /  Trunk Assessment: Normal   Communication Communication Communication: No difficulties   Cognition Arousal/Alertness: Awake/alert Behavior During Therapy: Flat affect Overall Cognitive Status: Impaired/Different from baseline Area of Impairment: Orientation;Attention;Memory;Following commands;Safety/judgement;Awareness;Problem solving                  Orientation Level: Disoriented to;Place;Time;Situation Current Attention Level: Sustained Memory: Decreased short-term memory Following Commands: Follows one step commands inconsistently;Follows one step commands with increased time Safety/Judgement: Decreased awareness of safety;Decreased awareness of deficits Awareness: Intellectual Problem Solving: Slow processing;Difficulty sequencing;Requires verbal cues General Comments: Pt disoriented to time, place, and situation. Despite orienting him to place and situation, pt creating reasons for why is in an unknown place. Pt perseverating on topic of getting his jeans and underwear as well as getting out of here and going to his truck which is parked outside. Pt benefits from calm and quiet environment. Following commands with increased cues and time.    General Comments  Rest, HR 80s, SpO2 97% on RA, and RR 20s. With activity, HR 101, RR 30, SpO2 >88% on RA    Exercises     Shoulder Instructions      Home Living Family/patient expects to be discharged to:: Private residence Living Arrangements: Alone Available Help at Discharge: Family (Unsure of availablility) Type of Home: House       Home Layout: One level     Bathroom Shower/Tub: Chief Strategy Officer: Standard     Home Equipment: Environmental consultant - 2 wheels   Additional Comments: Per pt, wife passed away two years ago      Prior Functioning/Environment Level of Independence: Independent        Comments: Pt reports he performed BADLs and daughter in law performs IADLs. Reports he does not use DME but has RW from his wife.         OT Problem List: Decreased strength;Decreased range of motion;Impaired balance (sitting and/or standing);Decreased activity tolerance;Decreased cognition;Decreased safety awareness;Decreased knowledge of use of DME or AE;Decreased knowledge of precautions;Cardiopulmonary status limiting activity      OT Treatment/Interventions:  Self-care/ADL training;Therapeutic exercise;Energy conservation;DME and/or AE instruction;Therapeutic activities;Patient/family education    OT Goals(Current goals can be found in the care plan section) Acute Rehab OT Goals Patient Stated Goal: Get to my truck and go home OT Goal Formulation: With patient Time For Goal Achievement: 07/07/20 Potential to Achieve Goals: Good  OT Frequency: Min 2X/week   Barriers to D/C:            Co-evaluation              AM-PAC OT "6 Clicks" Daily Activity     Outcome Measure Help from another person eating meals?: Total Help from another person taking care of personal grooming?: A Little Help from another person toileting, which includes using toliet, bedpan, or urinal?: A Lot Help from another person bathing (including washing, rinsing, drying)?: A Lot Help from another person to put on and taking off regular upper body clothing?: A Little Help from another person to put on and taking off regular lower body clothing?: A Lot 6 Click Score: 13   End of Session Equipment Utilized During Treatment: Oxygen Nurse Communication: Mobility status;Other (comment) (SpO2, chair alarm in place)  Activity Tolerance: Patient tolerated treatment well Patient left: in chair;with call bell/phone within reach;with chair alarm set;with restraints reapplied  OT Visit Diagnosis: Unsteadiness on feet (R26.81);Other abnormalities of gait and mobility (R26.89);Muscle weakness (generalized) (M62.81)  Time: 7955-8316 OT Time Calculation (min): 27 min Charges:  OT General Charges $OT Visit: 1 Visit OT Evaluation $OT Eval Moderate Complexity: 1 Mod OT Treatments $Self Care/Home Management : 8-22 mins  Gregory Wheeler MSOT, OTR/L Acute Rehab Pager: [phone] Office: [phone]  Gregory Wheeler 06/23/2020, 9:07 AM

## 2020-06-23 NOTE — CV Procedure (Signed)
2D echo attempted, in chair. RN states he just got to chair. Try echo later

## 2020-06-23 NOTE — Progress Notes (Signed)
  Echocardiogram 2D Echocardiogram has been performed.  Gregory Wheeler 06/23/2020, 11:52 AM

## 2020-06-23 NOTE — Plan of Care (Signed)
  Problem: Respiratory: Goal: Will maintain a patent airway Outcome: Progressing Goal: Complications related to the disease process, condition or treatment will be avoided or minimized Outcome: Progressing   Problem: Clinical Measurements: Goal: Ability to maintain clinical measurements within normal limits will improve Outcome: Progressing Goal: Will remain free from infection Outcome: Progressing Goal: Diagnostic test results will improve Outcome: Progressing Goal: Respiratory complications will improve Outcome: Progressing Goal: Cardiovascular complication will be avoided Outcome: Progressing   Problem: Elimination: Goal: Will not experience complications related to bowel motility Outcome: Progressing Goal: Will not experience complications related to urinary retention Outcome: Progressing   Problem: Pain Managment: Goal: General experience of comfort will improve Outcome: Progressing   Problem: Skin Integrity: Goal: Risk for impaired skin integrity will decrease Outcome: Progressing   Problem: Education: Goal: Knowledge of risk factors and measures for prevention of condition will improve Outcome: Not Progressing   Problem: Coping: Goal: Psychosocial and spiritual needs will be supported Outcome: Not Progressing   Problem: Education: Goal: Knowledge of General Education information will improve Description: Including pain rating scale, medication(s)/side effects and non-pharmacologic comfort measures Outcome: Not Progressing   Problem: Health Behavior/Discharge Planning: Goal: Ability to manage health-related needs will improve Outcome: Not Progressing   Problem: Activity: Goal: Risk for activity intolerance will decrease Outcome: Not Progressing   Problem: Nutrition: Goal: Adequate nutrition will be maintained Outcome: Not Progressing   Problem: Coping: Goal: Level of anxiety will decrease Outcome: Not Progressing   Problem: Safety: Goal: Ability to  remain free from injury will improve Outcome: Not Progressing

## 2020-06-23 NOTE — Progress Notes (Addendum)
PROGRESS NOTE                                                                                                                                                                                                             Patient Demographics:    Gregory Wheeler, is a 79 y.o. male, DOB - 02-10-1941, ZOX:[id number]  Outpatient Primary MD for the patient is Jacqualine Mau, NP    LOS - 1  Admit date - 06/22/2020    Chief Complaint  Patient presents with   Covid pos  and Not eating or drinking       Brief Narrative (HPI from H&P)  - Gregory Wheeler is a 79 y.o. male with medical history significant of Spondylosis, gout, GERD, HTn, p A.fib, iron defficiency anemia, heavy alcohol abuse, CAD, HLD, right bundle branch block, history of small bowel obstruction, history of second-degree AV block Mobitz type I who has been sick for around 2 to 3 weeks before seeking help presented to the ER with shortness of breath and weakness.  Was diagnosed with COVID-19 pneumonia along with possible aspiration pneumonia, also had evidence of severe dehydration and AKI and was admitted to the hospital.   Subjective:    Gregory Wheeler today in bed, appears to be quite confused, denies any headache or chest pain, does state that he has a cough and mildly short of breath.  Unreliable historian.   Assessment  & Plan :     1. Acute Hypoxic Resp. Failure due to Acute Covid 19 Viral Pneumonitis during the ongoing 2020 Covid 19 Pandemic -there is also evidence of possible overlying aspiration pneumonia causing bacterial infection.  He is vaccinated and likely has breakthrough infection, he has been started on steroids and Remdesivir along with antibiotics, speech therapy following aspiration precautions.  Overall quite tenuous due to his advanced age and multiple other medical issues.  Encouraged the patient to sit up in chair in the daytime use I-S and flutter  valve for pulmonary toiletry and then prone in bed when at night.  Will advance activity and titrate down oxygen as possible.    SpO2: 97 % O2 Flow Rate (L/min): 2 L/min  Recent Labs  Lab 06/22/20 1143 06/22/20 1144 06/22/20 1405 06/23/20 0140 06/23/20 0626  WBC 17.3*  --   --  6.2  --  HGB 13.2  --   --  10.3*  --   HCT 39.5  --   --  31.5*  --   PLT 283  --   --  231  --   CRP  --  11.1*  --  9.0*  --   BNP  --   --   --  278.3*  --   DDIMER 0.67*  --   --  0.39  --   PROCALCITON 0.86  --   --   --  0.52  AST 31  --   --  31  --   ALT 20  --   --  21  --   ALKPHOS 55  --   --  48  --   BILITOT 1.5*  --   --  1.3*  --   ALBUMIN 2.5*  --   --  1.7*  --   LATICACIDVEN 2.4*  --  1.7  --   --   SARSCOV2NAA POSITIVE*  --   --   --   --     Aspiration pneumonia-coughing with eating.  Along with metabolic encephalopathy, high risk for aspiration, dysphagia 3 diet speech to evaluate.  Unasyn added.  Severe metabolic encephalopathy.  Supportive care, minimize benzodiazepines and narcotics, monitor for any signs of DTs.    RBBB (right bundle branch block) -chronic stable   Hypertension -continue beta-blocker   Coronary artery disease -chronic stable continue beta-blocker patient does not seem to be on a statin.   Chronic atrial fibrillation (HCC) -continue Xarelto and beta-blocker when able to tolerate p.o.   AKI (acute kidney injury) (HCC) - hydrate with IV fluids and Foley catheter for retention.  Urinary retention - Flomax and Foley on 06/23/2020.  Unable to void.  Dehydration -gently rehydrate being mindful of Covid positive   Hypokalemia and hypomagnesemia with severe dehydration- -adding to the family members he was sick for at least 2 to 3 days before he came to the hospital, severely dehydrated, IV fluids and replace electrolytes.  Hypoalbuminemia -protein supplementation added.  Debility will have PT OT evaluate prior to discharge other plan as per orders.    RBBB (right bundle branch block) -chronic stable   Hypertension -continue beta-blocker   Coronary artery disease -chronic stable continue beta-blocker patient does not seem to be on a statin.   Chronic atrial fibrillation (HCC) -continue Xarelto and beta-blocker when able to tolerate p.o.   Alcohol abuse - Per family drank large amounts of alcohol until around 2 weeks ago, in the last 2 weeks according to them he has had not had any alcohol except half a bottle of beer on Saturday.  Currently no signs of DTs, will place on folic acid and thiamine and monitor.     Condition - Extremely Guarded  Family Communication  :   Argentina Ponder - [phone] - 06/23/20 clearly explained life-threatening condition with extremely grave prognosis.  Daughter [phone] on June 23, 2020 clearly explained life-threatening condition with extremely grave prognosis.   Code Status :  Full  Consults  :  None  Procedures  :    CT -  1. No acute intracranial abnormality. 2. Age related atrophy and chronic small vessel ischemia. 3. Opacification of the lower right greater than left mastoid air cells likely mastoid effusion, progressed from 2017. Small fluid levels in the left and right side of the sphenoid sinus.  PUD Prophylaxis :   Disposition Plan  :    Status is:  Inpatient  Not inpatient appropriate, will call [facility] team and downgrade to OBS.   Dispo: The patient is from: Home              Anticipated d/c is to: Home              Anticipated d/c date is: > 3 days              Patient currently is not medically stable to d/c.   DVT Prophylaxis  :  Xaralto  Lab Results  Component Value Date   PLT 231 06/23/2020    Diet :  Diet Order            DIET DYS 3 Room service appropriate? Yes; Fluid consistency: Thin  Diet effective now                  Inpatient Medications  Scheduled Meds:  allopurinol  300 mg Oral QHS   vitamin C  500 mg Oral Daily   dexamethasone (DECADRON)  injection  6 mg Intravenous Q24H   metoprolol succinate  50 mg Oral QPM   nitroGLYCERIN  0.5 inch Topical Q6H   rivaroxaban  20 mg Oral Q supper   sodium chloride flush  3 mL Intravenous Q12H   tamsulosin  0.4 mg Oral Daily   zinc sulfate  220 mg Oral Daily   Continuous Infusions:  ampicillin (OMNIPEN) IV 2 g (06/23/20 0956)   lactated ringers 1,000 mL (06/23/20 0802)   magnesium sulfate bolus IVPB     remdesivir 100 mg in NS 100 mL 100 mg (06/23/20 0909)   PRN Meds:.acetaminophen, ALPRAZolam, famotidine, guaiFENesin-dextromethorphan, hydrALAZINE, [DISCONTINUED] ondansetron **OR** ondansetron (ZOFRAN) IV  Antibiotics  :    Anti-infectives (From admission, onward)   Start     Dose/Rate Route Frequency Ordered Stop   06/23/20 1430  azithromycin (ZITHROMAX) 500 mg in sodium chloride 0.9 % 250 mL IVPB  Status:  Discontinued        500 mg 250 mL/hr over 60 Minutes Intravenous Every 24 hours 06/22/20 1406 06/22/20 1410   06/23/20 1000  remdesivir 100 mg in sodium chloride 0.9 % 100 mL IVPB        100 mg 200 mL/hr over 30 Minutes Intravenous Daily 06/22/20 1345 06/27/20 0959   06/23/20 0945  ampicillin (OMNIPEN) 2 g in sodium chloride 0.9 % 100 mL IVPB        2 g 300 mL/hr over 20 Minutes Intravenous Every 4 hours 06/23/20 0852     06/22/20 1430  azithromycin (ZITHROMAX) 500 mg in sodium chloride 0.9 % 250 mL IVPB  Status:  Discontinued        500 mg 250 mL/hr over 60 Minutes Intravenous Every 24 hours 06/22/20 1410 06/23/20 0902   06/22/20 1400  remdesivir 100 mg in sodium chloride 0.9 % 100 mL IVPB        100 mg 200 mL/hr over 30 Minutes Intravenous Every 30 min 06/22/20 1345 06/22/20 1518   06/22/20 1400  cefTRIAXone (ROCEPHIN) 2 g in sodium chloride 0.9 % 100 mL IVPB  Status:  Discontinued        2 g 200 mL/hr over 30 Minutes Intravenous Every 24 hours 06/22/20 1357 06/23/20 0902   06/22/20 1400  azithromycin (ZITHROMAX) 500 mg in sodium chloride 0.9 % 250 mL IVPB   Status:  Discontinued        500 mg 250 mL/hr over 60 Minutes Intravenous Every 24 hours 06/22/20 1357 06/22/20 1406  Time Spent in minutes  30   Susa Raring M.D on 06/23/2020 at 11:06 AM  To page go to [email or web address] - password [facility]  [facility] -  Office  [phone]   See all Orders from today for further details    Objective:   Vitals:   06/23/20 0731 06/23/20 0810 06/23/20 0814 06/23/20 1052  BP: (!) 174/72 (!) 175/66 (!) 170/76 (!) 141/67  Pulse: 64 (!) 55  71  Resp: Temp: 98.1 F (36.7 C)     TempSrc: Oral     SpO2: 92% 96%  97%  Weight:   111 kg   Height:        Wt Readings from Last 3 Encounters:  06/23/20 111 kg  03/14/20 110.3 kg  09/17/19 106.6 kg     Intake/Output Summary (Last 24 hours) at 06/23/2020 1106 Last data filed at 06/23/2020 1017 Gross per 24 hour  Intake 1259.34 ml  Output --  Net 1259.34 ml     Physical Exam  Awake but pleasantly confused, moving all 4 extremities, Gregory Wheeler.AT,PERRAL Supple Neck,No JVD, No cervical lymphadenopathy appriciated.  Symmetrical Chest wall movement, Good air movement bilaterally, coarse bilateral breath sounds RRR,No Gallops,Rubs or new Murmurs, No Parasternal Heave +ve B.Sounds, Abd Soft, No tenderness, No organomegaly appriciated, No rebound - guarding or rigidity. No Cyanosis, Clubbing or edema, No new Rash or bruise      Data Review:    CBC Recent Labs  Lab 06/22/20 1143 06/23/20 0140  WBC 17.3* 6.2  HGB 13.2 10.3*  HCT 39.5 31.5*  PLT 283 231  MCV 102.6* 103.6*  MCH 34.3* 33.9  MCHC 33.4 32.7  RDW 14.0 13.9  LYMPHSABS 1.0 0.5*  MONOABS 0.9 0.2  EOSABS 0.1 0.0  BASOSABS 0.1 0.0    Recent Labs  Lab 06/22/20 1143 06/22/20 1144 06/22/20 1405 06/22/20 2235 06/22/20 2236 06/23/20 0140 06/23/20 0626  NA 134*  --   --   --  135 135  --   K 3.2*  --   --   --  3.7 3.6  --   CL 95*  --   --   --  99 103  --   CO2 24  --   --   --  24 23  --   GLUCOSE 114*   --   --   --  147* 144*  --   BUN 23  --   --   --  22 22  --   CREATININE 1.34*  --   --   --  1.34* 1.22  --   CALCIUM 8.4*  --   --   --  8.0* 7.9*  --   AST 31  --   --   --   --  31  --   ALT 20  --   --   --   --  21  --   ALKPHOS 55  --   --   --   --  48  --   BILITOT 1.5*  --   --   --   --  1.3*  --   ALBUMIN 2.5*  --   --   --   --  1.7*  --   MG  --   --   --   --  1.0* 1.0*  --   CRP  --  11.1*  --   --   --  9.0*  --   DDIMER  0.67*  --   --   --   --  0.39  --   PROCALCITON 0.86  --   --   --   --   --  0.52  LATICACIDVEN 2.4*  --  1.7  --   --   --   --   AMMONIA  --   --   --  11  --   --   --   BNP  --   --   --   --   --  278.3*  --     ------------------------------------------------------------------------------------------------------------------ Recent Labs    06/22/20 1143  TRIG 165*    No results found for: HGBA1C ------------------------------------------------------------------------------------------------------------------ No results for input(s): TSH, T4TOTAL, T3FREE, THYROIDAB in the last 72 hours.  Invalid input(s): FREET3  Cardiac Enzymes No results for input(s): CKMB, TROPONINI, MYOGLOBIN in the last 168 hours.  Invalid input(s): CK ------------------------------------------------------------------------------------------------------------------    Component Value Date/Time   BNP 278.3 (H) 06/23/2020 0140    Micro Results Recent Results (from the past 240 hour(s))  Blood Culture (routine x 2)     Status: None (Preliminary result)   Collection Time: 06/22/20 11:41 AM   Specimen: Right Antecubital; Blood  Result Value Ref Range Status   Specimen Description   Final    RIGHT ANTECUBITAL Performed at [facility], [address] [place], [place] [address]    Special Requests   Final    BOTTLES DRAWN AEROBIC AND ANAEROBIC Blood Culture adequate volume Performed at [facility], [address] [place],  [place] [address]    Culture  Setup Time   Final    IN BOTH AEROBIC AND ANAEROBIC BOTTLES GRAM POSITIVE COCCI Organism ID to follow CRITICAL RESULT CALLED TO, READ BACK BY AND VERIFIED WITH: Lana Fish PharmD 8:25 06/23/20 ([email or web address])    Culture   Final    NO GROWTH < 24 HOURS Performed at [facility], [address], [place], [place] [address]    Report Status PENDING  Incomplete  Blood Culture ID Panel (Reflexed)     Status: Abnormal   Collection Time: 06/22/20 11:41 AM  Result Value Ref Range Status   Enterococcus faecalis DETECTED (A) NOT DETECTED Final    Comment: CRITICAL RESULT CALLED TO, READ BACK BY AND VERIFIED WITH: Lana Fish PharmD 8:25 06/23/20 ([email or web address])    Enterococcus Faecium NOT DETECTED NOT DETECTED Final   Listeria monocytogenes NOT DETECTED NOT DETECTED Final   Staphylococcus species NOT DETECTED NOT DETECTED Final   Staphylococcus aureus (BCID) NOT DETECTED NOT DETECTED Final   Staphylococcus epidermidis NOT DETECTED NOT DETECTED Final   Staphylococcus lugdunensis NOT DETECTED NOT DETECTED Final   Streptococcus species NOT DETECTED NOT DETECTED Final   Streptococcus agalactiae NOT DETECTED NOT DETECTED Final   Streptococcus pneumoniae NOT DETECTED NOT DETECTED Final   Streptococcus pyogenes NOT DETECTED NOT DETECTED Final   A.calcoaceticus-baumannii NOT DETECTED NOT DETECTED Final   Bacteroides fragilis NOT DETECTED NOT DETECTED Final   Enterobacterales NOT DETECTED NOT DETECTED Final   Enterobacter cloacae complex NOT DETECTED NOT DETECTED Final   Escherichia coli NOT DETECTED NOT DETECTED Final   Klebsiella aerogenes NOT DETECTED NOT DETECTED Final   Klebsiella oxytoca NOT DETECTED NOT DETECTED Final   Klebsiella pneumoniae NOT DETECTED NOT DETECTED Final   Proteus species NOT DETECTED NOT DETECTED Final   Salmonella species NOT DETECTED NOT DETECTED Final   Serratia marcescens NOT DETECTED NOT DETECTED Final  Haemophilus influenzae NOT DETECTED NOT DETECTED  Final   Neisseria meningitidis NOT DETECTED NOT DETECTED Final   Pseudomonas aeruginosa NOT DETECTED NOT DETECTED Final   Stenotrophomonas maltophilia NOT DETECTED NOT DETECTED Final   Candida albicans NOT DETECTED NOT DETECTED Final   Candida auris NOT DETECTED NOT DETECTED Final   Candida glabrata NOT DETECTED NOT DETECTED Final   Candida krusei NOT DETECTED NOT DETECTED Final   Candida parapsilosis NOT DETECTED NOT DETECTED Final   Candida tropicalis NOT DETECTED NOT DETECTED Final   Cryptococcus neoformans/gattii NOT DETECTED NOT DETECTED Final   Vancomycin resistance NOT DETECTED NOT DETECTED Final    Comment: Performed at [facility], [address], [place], [place] [address]  Respiratory Panel by RT PCR (Flu A&B, Covid) - Nasopharyngeal Swab     Status: Abnormal   Collection Time: 06/22/20 11:43 AM   Specimen: Nasopharyngeal Swab  Result Value Ref Range Status   SARS Coronavirus 2 by RT PCR POSITIVE (A) NEGATIVE Final    Comment: RESULT CALLED TO, READ BACK BY AND VERIFIED WITH:  COBLE SAM,RN @1323  ON 06/22/2020, [email or web address] (NOTE) SARS-CoV-2 target nucleic acids are DETECTED.  SARS-CoV-2 RNA is generally detectable in upper respiratory specimens  during the acute phase of infection. Positive results are indicative of the presence of the identified virus, but do not rule out bacterial infection or co-infection with other pathogens not detected by the test. Clinical correlation with patient history and other diagnostic information is necessary to determine patient infection status. The expected result is Negative.  Fact Sheet for Patients:  06/24/2020  Fact Sheet for Healthcare Providers: https://www.moore.com/  This test is not yet approved or cleared by the https://www.young.biz/ FDA and  has been authorized for detection and/or diagnosis of SARS-CoV-2 by FDA under an Emergency Use Authorization (EUA).  This EUA  will remain in effect (meaning this  test can be used) for the duration of  the COVID-19 declaration under Section 564(b)(1) of the Act, 21 U.S.C. section 360bbb-3(b)(1), unless the authorization is terminated or revoked sooner.      Influenza A by PCR NEGATIVE NEGATIVE Final   Influenza B by PCR NEGATIVE NEGATIVE Final    Comment: (NOTE) The Xpert Xpress SARS-CoV-2/FLU/RSV assay is intended as an aid in  the diagnosis of influenza from Nasopharyngeal swab specimens and  should not be used as a sole basis for treatment. Nasal washings and  aspirates are unacceptable for Xpert Xpress SARS-CoV-2/FLU/RSV  testing.  Fact Sheet for Patients: [place]  Fact Sheet for Healthcare Providers: https://www.moore.com/  This test is not yet approved or cleared by the https://www.young.biz/ FDA and  has been authorized for detection and/or diagnosis of SARS-CoV-2 by  FDA under an Emergency Use Authorization (EUA). This EUA will remain  in effect (meaning this test can be used) for the duration of the  Covid-19 declaration under Section 564(b)(1) of the Act, 21  U.S.C. section 360bbb-3(b)(1), unless the authorization is  terminated or revoked. Performed at [facility], [address] [place], [place]   Blood Culture (routine x 2)     Status: None (Preliminary result)   Collection Time: 06/22/20 11:58 AM   Specimen: Left Antecubital; Blood  Result Value Ref Range Status   Specimen Description   Final    LEFT ANTECUBITAL Performed at [facility], [address] [place], [place]    Special Requests   Final    BOTTLES DRAWN AEROBIC AND  ANAEROBIC Blood Culture adequate volume Performed at [facility], [address] [place], [place] [address]    Culture  Setup Time   Final    IN BOTH AEROBIC AND ANAEROBIC BOTTLES GRAM POSITIVE COCCI CRITICAL VALUE NOTED.  VALUE IS CONSISTENT WITH PREVIOUSLY  REPORTED AND CALLED VALUE.    Culture   Final    NO GROWTH < 24 HOURS Performed at [facility], [address] [place], [place] [address]    Report Status PENDING  Incomplete  MRSA PCR Screening     Status: None   Collection Time: 06/23/20  8:08 AM   Specimen: Nasal Mucosa; Nasopharyngeal  Result Value Ref Range Status   MRSA by PCR NEGATIVE NEGATIVE Final    Comment:        The GeneXpert MRSA Assay (FDA approved for NASAL specimens only), is one component of a comprehensive MRSA colonization surveillance program. It is not intended to diagnose MRSA infection nor to guide or monitor treatment for MRSA infections. Performed at [facility], [address] [place], [place] [address]     Radiology Reports CT HEAD WO CONTRAST  Result Date: 06/22/2020 CLINICAL DATA:  Delirium. EXAM: CT HEAD WITHOUT CONTRAST TECHNIQUE: Contiguous axial images were obtained from the base of the skull through the vertex without intravenous contrast. COMPARISON:  Head CT 05/14/2016 FINDINGS: Brain: No intracranial hemorrhage, mass effect, or midline shift. Age related atrophy. No hydrocephalus. The basilar cisterns are patent. Mild to moderate chronic small vessel ischemia with slight progression from 2017. No evidence of territorial infarct or acute ischemia. No extra-axial or intracranial fluid collection. Vascular: Atherosclerosis of skullbase vasculature without hyperdense vessel or abnormal calcification. Skull: No fracture or focal lesion. Sinuses/Orbits: Opacification of the lower right greater than left mastoid air cells, progressed from 2017. Small fluid levels in the left and right side of sphenoid sinus. No acute orbital abnormality. Bilateral cataract resection. Other: None. IMPRESSION: 1. No acute intracranial abnormality. 2. Age related atrophy and chronic small vessel ischemia. 3. Opacification of the lower right greater than left mastoid air cells likely mastoid effusion,  progressed from 2017. Small fluid levels in the left and right side of the sphenoid sinus. Electronically Signed   By: Narda RutherfordMelanie  Sanford M.D.   On: 06/22/2020 22:36   DG Chest Port 1 View  Result Date: 06/23/2020 CLINICAL DATA:  Shortness of breath and COVID. EXAM: PORTABLE CHEST 1 VIEW COMPARISON:  06/22/2020 FINDINGS: 0846 hours. Low lung volumes. Progressive patchy ground-glass and consolidative airspace opacity in the lower lungs. The cardio pericardial silhouette is enlarged. No substantial pleural effusion. The visualized bony structures of the thorax show no acute abnormality. Telemetry leads overlie the chest. IMPRESSION: Progressive bibasilar airspace disease consistent with multifocal pneumonia. Electronically Signed   By: Kennith CenterEric  Mansell M.D.   On: 06/23/2020 09:04   DG Chest Port 1 View  Result Date: 06/22/2020 CLINICAL DATA:  COVID, altered mental status. EXAM: PORTABLE CHEST 1 VIEW COMPARISON:  10/19/2014. FINDINGS: Low lung volumes. Subtle airspace opacities in the peripheral lung bases. No visible pleural effusions or pneumothorax. Cardiomediastinal silhouette is within normal limits. No evidence of acute osseous abnormality. IMPRESSION: Subtle airspace opacities in the peripheral lung bases, concerning for multifocal pneumonia and consistent with reported COVID diagnosis. Electronically Signed   By: Feliberto HartsFrederick S Jones MD   On: 06/22/2020 11:39

## 2020-06-23 NOTE — Consult Note (Signed)
[facility]       Reason for Consult: Enterococcal bacteremia    Referring Physician: CHAMP autoconsult  Active Problems:   Coronary artery disease   Hypertension   RBBB (right bundle branch block)   AKI (acute kidney injury) (HCC)   Chronic atrial fibrillation (HCC)   Alcohol abuse   Long term (current) use of anticoagulants [Z79.01]   Pneumonia due to COVID-19 virus   Dehydration   Acute respiratory failure due to COVID-19 (HCC)   Hypokalemia   Acute metabolic encephalopathy   . allopurinol  300 mg Oral QHS  . vitamin C  500 mg Oral Daily  . dexamethasone (DECADRON) injection  6 mg Intravenous Q24H  . metoprolol succinate  50 mg Oral QPM  . nitroGLYCERIN  0.5 inch Topical Q6H  . rivaroxaban  20 mg Oral Q supper  . sodium chloride flush  3 mL Intravenous Q12H  . tamsulosin  0.4 mg Oral Daily  . zinc sulfate  220 mg Oral Daily    Recommendations: I agree with Unasyn at this point Since he won't be eligible for a TEE, will consider empiric ceftriaxone + ampicillin for a prolonged duration until TEE possible TTE (being done now)  Assessment: He has COVID pneumonia with hypoxia, multifocal opacities and now with Enterococcal bacteremia in 4/4 blood cultures bottles.  No urinary symptoms.    Antibiotics: Ceftriaxone and azithromycin  HPI: Gregory Wheeler is a 79 y.o. male with afib, CAD, HLD, RBBB brought in by his family with concern for COVID pneumonia.  He is positive for COVID and now also with bacteremia as above.  He has been vaccinated in the past.  He has been more confused and decreased po intake particularly with sob.  He also has recently been drinking heavily.   He states he wants to go home.   Review of Systems:  Constitutional: negative for malaise and anorexia Gastrointestinal: negative for diarrhea Integument/breast: negative for rash All other systems reviewed and are negative    Past Medical History:  Diagnosis Date    . Arthritis   . Atrial fibrillation (HCC) 01/31/2015  . Chronic gout   . Coronary artery disease 12/2007   STATUS POST STENTING OF THE LEFT ANTERIOR DESCENDING ARTERY  . COVID-19   . Dyslipidemia   . Gout   . Hiatal hernia   . Hyperlipidemia   . Hypertension   . LBP (low back pain)   . Partial small bowel obstruction (HCC)   . RBBB (right bundle branch block)   . SBO (small bowel obstruction) (HCC)    PARTIAL  . Second degree AV block, Mobitz type I   . Urinary tract infection    Escherichia coli urinary tract infection    Social History   Tobacco Use  . Smoking status: Former Smoker    Quit date: 08/27/1990    Years since quitting: 29.8  . Smokeless tobacco: Never Used  Vaping Use  . Vaping Use: Never used  Substance Use Topics  . Alcohol use: Yes    Comment: near daily  . Drug use: No    Family History  Problem Relation Age of Onset  . Emphysema Mother [id number]  . Heart attack Father 1453    No Known Allergies  Physical Exam: Constitutional: in no apparent distress  Vitals:   06/23/20 0814 06/23/20 1052  BP: (!) 170/76 (!) 141/67  Pulse:  71  Resp:  20  Temp:    SpO2:  97%   EYES: anicteric Cardiovascular: Cor RRR Respiratory: clear; Musculoskeletal: no pedal edema noted Skin: negatives: no rash Neuro: non-focal  Lab Results  Component Value Date   WBC 6.2 06/23/2020   HGB 10.3 (L) 06/23/2020   HCT 31.5 (L) 06/23/2020   MCV 103.6 (H) 06/23/2020   PLT 231 06/23/2020    Lab Results  Component Value Date   CREATININE 1.22 06/23/2020   BUN 22 06/23/2020   NA 135 06/23/2020   K 3.6 06/23/2020   CL 103 06/23/2020   CO2 23 06/23/2020    Lab Results  Component Value Date   ALT 21 06/23/2020   AST 31 06/23/2020   ALKPHOS 48 06/23/2020     Microbiology: Recent Results (from the past 240 hour(s))  Blood Culture (routine x 2)     Status: None (Preliminary result)   Collection Time: 06/22/20 11:41 AM   Specimen: Right Antecubital; Blood  Result  Value Ref Range Status   Specimen Description   Final    RIGHT ANTECUBITAL Performed at [facility], [address] [place], [place] [address]    Special Requests   Final    BOTTLES DRAWN AEROBIC AND ANAEROBIC Blood Culture adequate volume Performed at [facility], [address] [place], [place] [address]    Culture  Setup Time   Final    IN BOTH AEROBIC AND ANAEROBIC BOTTLES GRAM POSITIVE COCCI Organism ID to follow CRITICAL RESULT CALLED TO, READ BACK BY AND VERIFIED WITH: Lana Fish PharmD 8:25 06/23/20 ([email or web address])    Culture   Final    NO GROWTH < 24 HOURS Performed at [facility], [address] [place], [place] [address]    Report Status PENDING  Incomplete  Blood Culture ID Panel (Reflexed)     Status: Abnormal   Collection Time: 06/22/20 11:41 AM  Result Value Ref Range Status   Enterococcus faecalis DETECTED (A) NOT DETECTED Final    Comment: CRITICAL RESULT CALLED TO, READ BACK BY AND VERIFIED WITH: Lana Fish PharmD 8:25 06/23/20 ([email or web address])    Enterococcus Faecium NOT DETECTED NOT DETECTED Final   Listeria monocytogenes NOT DETECTED NOT DETECTED Final   Staphylococcus species NOT DETECTED NOT DETECTED Final   Staphylococcus aureus (BCID) NOT DETECTED NOT DETECTED Final   Staphylococcus epidermidis NOT DETECTED NOT DETECTED Final   Staphylococcus lugdunensis NOT DETECTED NOT DETECTED Final   Streptococcus species NOT DETECTED NOT DETECTED Final   Streptococcus agalactiae NOT DETECTED NOT DETECTED Final   Streptococcus pneumoniae NOT DETECTED NOT DETECTED Final   Streptococcus pyogenes NOT DETECTED NOT DETECTED Final   A.calcoaceticus-baumannii NOT DETECTED NOT DETECTED Final   Bacteroides fragilis NOT DETECTED NOT DETECTED Final   Enterobacterales NOT DETECTED NOT DETECTED Final   Enterobacter cloacae complex NOT DETECTED NOT DETECTED Final   Escherichia coli NOT DETECTED NOT DETECTED Final   Klebsiella aerogenes NOT DETECTED NOT  DETECTED Final   Klebsiella oxytoca NOT DETECTED NOT DETECTED Final   Klebsiella pneumoniae NOT DETECTED NOT DETECTED Final   Proteus species NOT DETECTED NOT DETECTED Final   Salmonella species NOT DETECTED NOT DETECTED Final   Serratia marcescens NOT DETECTED NOT DETECTED Final   Haemophilus influenzae NOT DETECTED NOT DETECTED Final   Neisseria meningitidis NOT DETECTED NOT DETECTED Final   Pseudomonas aeruginosa NOT DETECTED NOT DETECTED Final   Stenotrophomonas maltophilia NOT DETECTED NOT DETECTED Final   Candida albicans NOT DETECTED NOT DETECTED Final   Candida auris NOT DETECTED NOT  DETECTED Final   Candida glabrata NOT DETECTED NOT DETECTED Final   Candida krusei NOT DETECTED NOT DETECTED Final   Candida parapsilosis NOT DETECTED NOT DETECTED Final   Candida tropicalis NOT DETECTED NOT DETECTED Final   Cryptococcus neoformans/gattii NOT DETECTED NOT DETECTED Final   Vancomycin resistance NOT DETECTED NOT DETECTED Final    Comment: Performed at [facility], [address], [place], [place] [address]  Respiratory Panel by RT PCR (Flu A&B, Covid) - Nasopharyngeal Swab     Status: Abnormal   Collection Time: 06/22/20 11:43 AM   Specimen: Nasopharyngeal Swab  Result Value Ref Range Status   SARS Coronavirus 2 by RT PCR POSITIVE (A) NEGATIVE Final    Comment: RESULT CALLED TO, READ BACK BY AND VERIFIED WITH:  COBLE SAM,RN @1323  ON 06/22/2020, [email or web address] (NOTE) SARS-CoV-2 target nucleic acids are DETECTED.  SARS-CoV-2 RNA is generally detectable in upper respiratory specimens  during the acute phase of infection. Positive results are indicative of the presence of the identified virus, but do not rule out bacterial infection or co-infection with other pathogens not detected by the test. Clinical correlation with patient history and other diagnostic information is necessary to determine patient infection status. The expected result is Negative.  Fact Sheet for Patients:   06/24/2020  Fact Sheet for Healthcare Providers: https://www.moore.com/  This test is not yet approved or cleared by the https://www.young.biz/ FDA and  has been authorized for detection and/or diagnosis of SARS-CoV-2 by FDA under an Emergency Use Authorization (EUA).  This EUA will remain in effect (meaning this  test can be used) for the duration of  the COVID-19 declaration under Section 564(b)(1) of the Act, 21 U.S.C. section 360bbb-3(b)(1), unless the authorization is terminated or revoked sooner.      Influenza A by PCR NEGATIVE NEGATIVE Final   Influenza B by PCR NEGATIVE NEGATIVE Final    Comment: (NOTE) The Xpert Xpress SARS-CoV-2/FLU/RSV assay is intended as an aid in  the diagnosis of influenza from Nasopharyngeal swab specimens and  should not be used as a sole basis for treatment. Nasal washings and  aspirates are unacceptable for Xpert Xpress SARS-CoV-2/FLU/RSV  testing.  Fact Sheet for Patients: [place]  Fact Sheet for Healthcare Providers: https://www.moore.com/  This test is not yet approved or cleared by the https://www.young.biz/ FDA and  has been authorized for detection and/or diagnosis of SARS-CoV-2 by  FDA under an Emergency Use Authorization (EUA). This EUA will remain  in effect (meaning this test can be used) for the duration of the  Covid-19 declaration under Section 564(b)(1) of the Act, 21  U.S.C. section 360bbb-3(b)(1), unless the authorization is  terminated or revoked. Performed at [facility], [address] [place], [place]   Blood Culture (routine x 2)     Status: None (Preliminary result)   Collection Time: 06/22/20 11:58 AM   Specimen: Left Antecubital; Blood  Result Value Ref Range Status   Specimen Description   Final    LEFT ANTECUBITAL Performed at [facility], [address] [place], [place]     Special Requests   Final    BOTTLES DRAWN AEROBIC AND ANAEROBIC Blood Culture adequate volume Performed at [facility], [address] [place], [place]    Culture  Setup Time   Final    IN BOTH AEROBIC AND ANAEROBIC BOTTLES GRAM POSITIVE COCCI CRITICAL VALUE NOTED.  VALUE IS CONSISTENT WITH PREVIOUSLY  REPORTED AND CALLED VALUE.    Culture   Final    NO GROWTH < 24 HOURS Performed at [facility], [address] [place], [place] [address]    Report Status PENDING  Incomplete  MRSA PCR Screening     Status: None   Collection Time: 06/23/20  8:08 AM   Specimen: Nasal Mucosa; Nasopharyngeal  Result Value Ref Range Status   MRSA by PCR NEGATIVE NEGATIVE Final    Comment:        The GeneXpert MRSA Assay (FDA approved for NASAL specimens only), is one component of a comprehensive MRSA colonization surveillance program. It is not intended to diagnose MRSA infection nor to guide or monitor treatment for MRSA infections. Performed at [facility], [address] [place], [place] [address]     Gardiner Barefoot, MD [facility] [email or web address] 06/23/2020, 11:17 AM

## 2020-06-23 NOTE — Evaluation (Signed)
Clinical/Bedside Swallow Evaluation Patient Details  Name: KEELEN QUEVEDO MRN: [id number] Date of Birth: 1941/03/31  Today's Date: 06/23/2020 Time: SLP Start Time (ACUTE ONLY): 1052 SLP Stop Time (ACUTE ONLY): 1110 SLP Time Calculation (min) (ACUTE ONLY): 18 min  Past Medical History:  Past Medical History:  Diagnosis Date  . Arthritis   . Atrial fibrillation (HCC) 01/31/2015  . Chronic gout   . Coronary artery disease 12/2007   STATUS POST STENTING OF THE LEFT ANTERIOR DESCENDING ARTERY  . COVID-19   . Dyslipidemia   . Gout   . Hiatal hernia   . Hyperlipidemia   . Hypertension   . LBP (low back pain)   . Partial small bowel obstruction (HCC)   . RBBB (right bundle branch block)   . SBO (small bowel obstruction) (HCC)    PARTIAL  . Second degree AV block, Mobitz type I   . Urinary tract infection    Escherichia coli urinary tract infection   Past Surgical History:  Past Surgical History:  Procedure Laterality Date  . CARDIAC CATHETERIZATION    . HERNIA REPAIR     INGUINAL  . ROTATOR CUFF REPAIR     HPI:  79 yo male presenting to ED with generalized weakness, poor food intake, and confusion. COVID positive. PMH including hiatal hernia, arthritis, A-fib, HTN, AKI, alcohol abuse, and CAD with s/p stenting of L anterior descending artery. CXR Progressive bibasilar airspace disease consistent with multifocal pneumonia.   Assessment / Plan / Recommendation Clinical Impression  Pt exhibting s/s aspiration and would benefit from instrumental testing. There was a delayed cough after solid cracker and water trial. After recovering he continued to cough with subsequent sips thin despite varying the volume. At rest his work of breathing and RR are stable. Question if there was any baseline dysphagia- stated he coughed with water earlier. He does have history of hiatal hernia. MBS recommended and per xray, may be tomorrow until completed. Modified liquids to nectar thick, continue Dys 3,  pills whole in puree.   SLP Visit Diagnosis: Dysphagia, unspecified (R13.10)    Aspiration Risk  Mild aspiration risk    Diet Recommendation Dysphagia 3 (Mech soft);Nectar-thick liquid   Liquid Administration via: Cup Medication Administration: Crushed with puree Supervision: Patient able to self feed;Full supervision/cueing for compensatory strategies Compensations: Minimize environmental distractions;Slow rate;Small sips/bites Postural Changes: Seated upright at 90 degrees    Other  Recommendations Oral Care Recommendations: Oral care BID   Follow up Recommendations Other (comment) (TBD)      Frequency and Duration            Prognosis        Swallow Study   General Date of Onset: 06/22/20 HPI: 79 yo male presenting to ED with generalized weakness, poor food intake, and confusion. COVID positive. PMH including hiatal hernia, arthritis, A-fib, HTN, AKI, alcohol abuse, and CAD with s/p stenting of L anterior descending artery. CXR Progressive bibasilar airspace disease consistent with multifocal pneumonia. Type of Study: Bedside Swallow Evaluation Diet Prior to this Study: Dysphagia 3 (soft);Thin liquids Temperature Spikes Noted: No Respiratory Status: Nasal cannula History of Recent Intubation: No Behavior/Cognition: Alert;Cooperative Oral Cavity Assessment: Other (comment) (white secretions soft palate- removed w/ oral care ) Oral Care Completed by SLP: Yes Oral Cavity - Dentition: Other (Comment) (upper and lower partials) Vision: Functional for self-feeding Self-Feeding Abilities: Able to feed self Patient Positioning: Upright in bed Baseline Vocal Quality: Normal Volitional Cough: Strong Volitional Swallow: Able to elicit  Oral/Motor/Sensory Function Overall Oral Motor/Sensory Function: Within functional limits   Ice Chips Ice chips: Not tested   Thin Liquid Thin Liquid: Impaired Presentation: Straw;Cup Pharyngeal  Phase Impairments: Cough - Immediate     Nectar Thick Nectar Thick Liquid: Not tested   Honey Thick Honey Thick Liquid: Not tested   Puree Puree: Within functional limits   Solid     Solid: Impaired Pharyngeal Phase Impairments: Cough - Immediate      Roque Cash, Breck Coons 06/23/2020,11:30 AM  Breck Coons Lonell Face.Ed Nurse, children's [phone] Office [phone]

## 2020-06-23 NOTE — Progress Notes (Signed)
PHARMACY - PHYSICIAN COMMUNICATION CRITICAL VALUE ALERT - BLOOD CULTURE IDENTIFICATION (BCID)  Gregory Wheeler is an 79 y.o. male who presented to [facility] on 06/22/2020 with a chief complaint of COVID-19 infection, not eating or drinking  Assessment:  Blood cultures 4/4 with gram positive cocci and BCID showing vancomycin sensitive Enterococcus faecalis  Name of physician (or Provider) Contacted: Susa Raring, MD; Jeanine Luz, NP  Current antibiotics: Ceftriaxone, azithromycin, remdesivir  Changes to prescribed antibiotics recommended: Recommend initiating ampicillin 2g IV q4 hours and d/c ceftriaxone/azithromycin per ID.   Response not received from provider;  current antibiotics are likely to cover the isolated organism.  Consider de-escalation soon.   Results for orders placed or performed during the hospital encounter of 06/22/20  Blood Culture ID Panel (Reflexed) (Collected: 06/22/2020 11:41 AM)  Result Value Ref Range   Enterococcus faecalis DETECTED (A) NOT DETECTED   Enterococcus Faecium NOT DETECTED NOT DETECTED   Listeria monocytogenes NOT DETECTED NOT DETECTED   Staphylococcus species NOT DETECTED NOT DETECTED   Staphylococcus aureus (BCID) NOT DETECTED NOT DETECTED   Staphylococcus epidermidis NOT DETECTED NOT DETECTED   Staphylococcus lugdunensis NOT DETECTED NOT DETECTED   Streptococcus species NOT DETECTED NOT DETECTED   Streptococcus agalactiae NOT DETECTED NOT DETECTED   Streptococcus pneumoniae NOT DETECTED NOT DETECTED   Streptococcus pyogenes NOT DETECTED NOT DETECTED   A.calcoaceticus-baumannii NOT DETECTED NOT DETECTED   Bacteroides fragilis NOT DETECTED NOT DETECTED   Enterobacterales NOT DETECTED NOT DETECTED   Enterobacter cloacae complex NOT DETECTED NOT DETECTED   Escherichia coli NOT DETECTED NOT DETECTED   Klebsiella aerogenes NOT DETECTED NOT DETECTED   Klebsiella oxytoca NOT DETECTED NOT DETECTED   Klebsiella pneumoniae NOT DETECTED NOT  DETECTED   Proteus species NOT DETECTED NOT DETECTED   Salmonella species NOT DETECTED NOT DETECTED   Serratia marcescens NOT DETECTED NOT DETECTED   Haemophilus influenzae NOT DETECTED NOT DETECTED   Neisseria meningitidis NOT DETECTED NOT DETECTED   Pseudomonas aeruginosa NOT DETECTED NOT DETECTED   Stenotrophomonas maltophilia NOT DETECTED NOT DETECTED   Candida albicans NOT DETECTED NOT DETECTED   Candida auris NOT DETECTED NOT DETECTED   Candida glabrata NOT DETECTED NOT DETECTED   Candida krusei NOT DETECTED NOT DETECTED   Candida parapsilosis NOT DETECTED NOT DETECTED   Candida tropicalis NOT DETECTED NOT DETECTED   Cryptococcus neoformans/gattii NOT DETECTED NOT DETECTED   Vancomycin resistance NOT DETECTED NOT DETECTED    Rexford Maus, PharmD PGY-1 Acute Care Pharmacy Resident Office: [phone] 06/23/2020 8:53 AM

## 2020-06-23 NOTE — Progress Notes (Signed)
Initial Nutrition Assessment  DOCUMENTATION CODES:   Obesity unspecified  INTERVENTION:  Continue Boost Plus po TID (thickened to appropriate consistency), each supplement provides 360 kcal and 14 grams of protein.   Encourage adequate PO intake.  NUTRITION DIAGNOSIS:   Increased nutrient needs related to catabolic illness (COVID) as evidenced by estimated needs.  GOAL:   Patient will meet greater than or equal to 90% of their needs  MONITOR:   PO intake, Supplement acceptance, Skin, Weight trends, Labs, I & O's  REASON FOR ASSESSMENT:   Consult Assessment of nutrition requirement/status  ASSESSMENT:   79 y.o. male with medical history significant of Spondylosis, gout, GERD, HTn, p A.fib, iron defficiency anemia, heavy alcohol abuse, CAD, HLD, right bundle branch block, history of small bowel obstruction presents with shortness of breath and weakness. Pt diagnosed with COVID-19 pneumonia along with possible aspiration pneumonia, also had evidence of severe dehydration and AKI.  RD unable to obtain pt nutrition history at this time. Pt confused. MD aware. Pt with poor po of 25%. Nutritional supplements has been ordered to aid in caloric and protein needs. Per RN and SLP, pt showing signs of aspiration. Pt currently on dysphagia 3 diet with nectar thick liquids. Plans for MBS tomorrow.   Unable to complete Nutrition-Focused physical exam at this time.   Labs and medications reviewed.   Diet Order:   Diet Order            DIET DYS 3 Room service appropriate? No; Fluid consistency: Nectar Thick  Diet effective now                 EDUCATION NEEDS:   Not appropriate for education at this time  Skin:  Skin Assessment: Reviewed RN Assessment  Last BM:  Unknown  Height:   Ht Readings from Last 1 Encounters:  06/22/20 5\' 11"  (1.803 m)    Weight:   Wt Readings from Last 1 Encounters:  06/23/20 111 kg    BMI:  Body mass index is 34.13 kg/m.  Estimated  Nutritional Needs:   Kcal:  2100-2300  Protein:  105-115 grams  Fluid:  >/= 2 L/day  06/25/20, MS, RD, LDN RD pager number/after hours weekend pager number on Amion.

## 2020-06-23 NOTE — Evaluation (Signed)
Physical Therapy Evaluation Patient Details Name: Gregory Wheeler MRN: [id number] DOB: 27-Jan-1941 Today's Date: 06/23/2020   History of Present Illness  Pt is 79 yo male presenting to ED with generalized weakness, poor food intake, and confusion. COVID positive. PMH including arthritis, A-fib, HTN, AKI, alcohol abuse, and CAD with s/p stenting of L anterior descending artery.  Clinical Impression  Pt admitted with above diagnosis. Pt presenting with confusion, decreased mobility, safety, endurance, balance, and strength.  Unsure of pt's baseline as he is confused at this time.  He was able to ambulate 20'x2 with RW but required min A for steadying.  Pt motivated to return home.  If returning home would need 24 hr supervision, if not available then he would need SNF placement at this time. Pt currently with functional limitations due to the deficits listed below (see PT Problem List). Pt will benefit from skilled PT to increase their independence and safety with mobility to allow discharge to the venue listed below.       Follow Up Recommendations Home health PT;Supervision/Assistance - 24 hour (if 24 hr supervision not available will need SNF if does not progress)    Equipment Recommendations  Rolling walker with 5" wheels;3in1 (PT)    Recommendations for Other Services       Precautions / Restrictions Precautions Precautions: Fall      Mobility  Bed Mobility Overal bed mobility: Needs Assistance Bed Mobility: Supine to Sit;Sit to Supine     Supine to sit: Min assist Sit to supine: Min assist        Transfers Overall transfer level: Needs assistance Equipment used: Rolling walker (2 wheeled) Transfers: Sit to/from Stand Sit to Stand: Min assist;From elevated surface         General transfer comment: Min A to steady and from elevated bed and BSC; sit to stand x 2  Ambulation/Gait Ambulation/Gait assistance: Min assist Gait Distance (Feet): 20 Feet (20'x2) Assistive  device: Rolling walker (2 wheeled) Gait Pattern/deviations: Step-through pattern;Decreased stride length;Drifts right/left Gait velocity: decreased   General Gait Details: fatigued easily; min A for balance; cues for RW and safety  Stairs            Wheelchair Mobility    Modified Rankin (Stroke Patients Only)       Balance Overall balance assessment: Needs assistance Sitting-balance support: No upper extremity supported;Feet supported Sitting balance-Leahy Scale: Good     Standing balance support: Bilateral upper extremity supported;During functional activity Standing balance-Leahy Scale: Poor Standing balance comment: required RW                             Pertinent Vitals/Pain Pain Assessment: No/denies pain    Home Living Family/patient expects to be discharged to:: Private residence Living Arrangements: Alone Available Help at Discharge: Family;Available PRN/intermittently Type of Home: House       Home Layout: One level Home Equipment: Walker - 2 wheels Additional Comments: Per pt, wife passed away two years ago    Prior Function Level of Independence: Needs assistance         Comments: Pt told OT earlier :"Pt reports he performed BADLs and daughter in law performs IADLs. 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        Extremity/Trunk Assessment   Upper Extremity Assessment  Upper Extremity Assessment: Overall WFL for tasks assessed    Lower Extremity Assessment Lower Extremity Assessment: Overall WFL for tasks assessed    Cervical / Trunk Assessment Cervical / Trunk Assessment: Normal  Communication   Communication: No difficulties  Cognition Arousal/Alertness: Awake/alert Behavior During Therapy: WFL for tasks assessed/performed Overall Cognitive Status: Impaired/Different from baseline Area of Impairment:  Orientation;Attention;Memory;Following commands;Safety/judgement;Awareness;Problem solving                 Orientation Level: Disoriented to;Time;Situation Current Attention Level: Sustained Memory: Decreased short-term memory Following Commands: Follows one step commands inconsistently;Follows one step commands with increased time Safety/Judgement: Decreased awareness of safety;Decreased awareness of deficits Awareness: Emergent Problem Solving: Slow processing;Difficulty sequencing;Requires verbal cues General Comments: Pt aware he is in hospital but unable to state why.  Pt provided different information to PT/OT regarding PLOF.      General Comments General comments (skin integrity, edema, etc.): Pt on 1 LPM O2 with sats stable.    Exercises     Assessment/Plan    PT Assessment Patient needs continued PT services  PT Problem List Decreased strength;Decreased mobility;Decreased safety awareness;Decreased activity tolerance;Decreased cognition;Cardiopulmonary status limiting activity;Decreased balance;Decreased knowledge of use of DME;Decreased range of motion;Decreased knowledge of precautions       PT Treatment Interventions DME instruction;Therapeutic activities;Gait training;Therapeutic exercise;Patient/family education;Balance training;Functional mobility training    PT Goals (Current goals can be found in the Care Plan section)  Acute Rehab PT Goals Patient Stated Goal: Get to my truck and go home PT Goal Formulation: With patient Time For Goal Achievement: 07/07/20 Potential to Achieve Goals: Good    Frequency Min 3X/week   Barriers to discharge Decreased caregiver support      Co-evaluation               AM-PAC PT "6 Clicks" Mobility  Outcome Measure Help needed turning from your back to your side while in a flat bed without using bedrails?: A Little Help needed moving from lying on your back to sitting on the side of a flat bed without using  bedrails?: A Little Help needed moving to and from a bed to a chair (including a wheelchair)?: A Little Help needed standing up from a chair using your arms (e.g., wheelchair or bedside chair)?: A Little Help needed to walk in hospital room?: A Little Help needed climbing 3-5 steps with a railing? : A Lot 6 Click Score: 17    End of Session Equipment Utilized During Treatment: Gait belt;Oxygen Activity Tolerance: Patient limited by fatigue Patient left: in bed;with call bell/phone within reach;with bed alarm set;Other (comment) (mittens were not on at arrival but were in bed, left off and notified RN) Nurse Communication: Mobility status PT Visit Diagnosis: Unsteadiness on feet (R26.81);Muscle weakness (generalized) (M62.81)    Time: 2423-5361 PT Time Calculation (min) (ACUTE ONLY): 32 min   Charges:   PT Evaluation $PT Eval Moderate Complexity: 1 Mod PT Treatments $Therapeutic Activity: 8-22 mins        Anise Salvo, PT Acute Rehab Services Pager [phone] [facility] [phone]    Rayetta Humphrey 06/23/2020, 5:59 PM

## 2020-06-23 NOTE — Progress Notes (Signed)
Modified Barium Swallow Progress Note  Patient Details  Name: Gregory Wheeler MRN: [id number] Date of Birth: 1941/02/23  Today's Date: 06/23/2020  Modified Barium Swallow completed.  Full report located under Chart Review in the Imaging Section.  Brief recommendations include the following:  Clinical Impression  Pt exhibits mild oropharyngeal dysphagia with aspiration of nectar consistency, and mild vallecular residual. 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 Pt unable to contain entire bolus in oral cavity and moderate amount nectar thick fell over tongue and was aspirated prior to initiation of laryngeal closure (frank volume) with immediate cough. Timing of closure delayed with thin using chin tuck and slight aspiraiton present. Lingual residue present falling into valleculae post swallow. Chin down posture using straw was effective with nectar however pt displays memory deficit and follow through of strategy is questionable. 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 Recommend honey thick liquids, Dys 3, crush meds. full supervision/assist. SLP will work with pt on chin tuck with nectar for possible upgrade.    Swallow Evaluation Recommendations       SLP Diet Recommendations: Dysphagia 3 (Mech soft) solids;Honey thick liquids   Liquid Administration via: Cup   Medication Administration: Crushed with puree   Supervision: Patient able to self feed;Full supervision/cueing for compensatory strategies   Compensations: Minimize environmental distractions;Slow rate;Small sips/bites   Postural Changes: Seated upright at 90 degrees;Remain semi-upright after after feeds/meals (Comment)   Oral Care Recommendations: Oral care BID        Royce Macadamia 06/23/2020,4:05 PM Breck Coons [place].Ed Nurse, children's [phone] Office  [phone]

## 2020-06-24 DIAGNOSIS — U071 COVID-19: Secondary | ICD-10-CM | POA: Diagnosis not present

## 2020-06-24 DIAGNOSIS — E872 Acidosis: Secondary | ICD-10-CM | POA: Diagnosis not present

## 2020-06-24 DIAGNOSIS — E876 Hypokalemia: Secondary | ICD-10-CM | POA: Diagnosis not present

## 2020-06-24 DIAGNOSIS — G9340 Encephalopathy, unspecified: Secondary | ICD-10-CM | POA: Diagnosis not present

## 2020-06-24 LAB — COMPREHENSIVE METABOLIC PANEL
ALT: 27 U/L (ref 0–44)
AST: 39 U/L (ref 15–41)
Albumin: 2.1 g/dL — ABNORMAL LOW (ref 3.5–5.0)
Alkaline Phosphatase: 49 U/L (ref 38–126)
Anion gap: 13 (ref 5–15)
BUN: 22 mg/dL (ref 8–23)
CO2: 23 mmol/L (ref 22–32)
Calcium: 8.5 mg/dL — ABNORMAL LOW (ref 8.9–10.3)
Chloride: 101 mmol/L (ref 98–111)
Creatinine, Ser: 1.07 mg/dL (ref 0.61–1.24)
GFR, Estimated: >60 mL/min (ref >60)
Glucose, Bld: 146 mg/dL — ABNORMAL HIGH (ref 70–99)
Potassium: 3.4 mmol/L — ABNORMAL LOW (ref 3.5–5.1)
Sodium: 137 mmol/L (ref 135–145)
Total Bilirubin: 0.8 mg/dL (ref 0.3–1.2)
Total Protein: 5.5 g/dL — ABNORMAL LOW (ref 6.5–8.1)

## 2020-06-24 LAB — CBC WITH DIFFERENTIAL/PLATELET
Abs Immature Granulocytes: 0.2 10*3/uL — ABNORMAL HIGH (ref 0.00–0.07)
Basophils Absolute: 0 10*3/uL (ref 0.0–0.1)
Basophils Relative: 0 %
Eosinophils Absolute: 0 10*3/uL (ref 0.0–0.5)
Eosinophils Relative: 0 %
HCT: 35.3 % — ABNORMAL LOW (ref 39.0–52.0)
Hemoglobin: 11.6 g/dL — ABNORMAL LOW (ref 13.0–17.0)
Immature Granulocytes: 2 %
Lymphocytes Relative: 5 %
Lymphs Abs: 0.6 10*3/uL — ABNORMAL LOW (ref 0.7–4.0)
MCH: 33.5 pg (ref 26.0–34.0)
MCHC: 32.9 g/dL (ref 30.0–36.0)
MCV: 102 fL — ABNORMAL HIGH (ref 80.0–100.0)
Monocytes Absolute: 0.4 10*3/uL (ref 0.1–1.0)
Monocytes Relative: 4 %
Neutro Abs: 9.8 10*3/uL — ABNORMAL HIGH (ref 1.7–7.7)
Neutrophils Relative %: 89 %
Platelets: 264 10*3/uL (ref 150–400)
RBC: 3.46 MIL/uL — ABNORMAL LOW (ref 4.22–5.81)
RDW: 13.7 % (ref 11.5–15.5)
WBC: 11 10*3/uL — ABNORMAL HIGH (ref 4.0–10.5)
nRBC: 0 % (ref 0.0–0.2)

## 2020-06-24 LAB — MAGNESIUM: Magnesium: 1.4 mg/dL — ABNORMAL LOW (ref 1.7–2.4)

## 2020-06-24 LAB — URINE CULTURE: Culture: NO GROWTH

## 2020-06-24 LAB — D-DIMER, QUANTITATIVE: D-Dimer, Quant: 0.51 ug/mL-FEU — ABNORMAL HIGH (ref 0.00–0.50)

## 2020-06-24 LAB — BRAIN NATRIURETIC PEPTIDE: B Natriuretic Peptide: 366.6 pg/mL — ABNORMAL HIGH (ref 0.0–100.0)

## 2020-06-24 LAB — PROCALCITONIN: Procalcitonin: 0.3 ng/mL

## 2020-06-24 LAB — C-REACTIVE PROTEIN: CRP: 3.7 mg/dL — ABNORMAL HIGH (ref <1.0)

## 2020-06-24 MED ORDER — KCL IN DEXTROSE-NACL 20-5-0.45 MEQ/L-%-% IV SOLN
INTRAVENOUS | Status: AC
  Filled 2020-06-24: qty 1000

## 2020-06-24 MED ORDER — THIAMINE HCL 100 MG PO TABS
100.0000 mg | ORAL_TABLET | Freq: Every day | ORAL | Status: DC
  Administered 2020-06-25 – 2020-06-26 (×2): 100 mg via ORAL
  Filled 2020-06-24: qty 1

## 2020-06-24 MED ORDER — LORAZEPAM 2 MG/ML IJ SOLN
1.0000 mg | Freq: Once | INTRAMUSCULAR | Status: AC
  Administered 2020-06-24: 1 mg via INTRAVENOUS
  Filled 2020-06-24: qty 1

## 2020-06-24 MED ORDER — SODIUM CHLORIDE 0.9 % IV SOLN
2.0000 g | INTRAVENOUS | Status: DC
  Administered 2020-06-24: 2 g via INTRAVENOUS
  Filled 2020-06-24: qty 20

## 2020-06-24 MED ORDER — HALOPERIDOL LACTATE 5 MG/ML IJ SOLN
2.0000 mg | Freq: Once | INTRAMUSCULAR | Status: AC
  Administered 2020-06-25: 2 mg via INTRAMUSCULAR
  Filled 2020-06-24: qty 1

## 2020-06-24 MED ORDER — SODIUM CHLORIDE 0.9 % IV SOLN
2.0000 g | Freq: Two times a day (BID) | INTRAVENOUS | Status: DC
  Administered 2020-06-24 – 2020-06-28 (×8): 2 g via INTRAVENOUS
  Filled 2020-06-24 (×8): qty 20

## 2020-06-24 NOTE — Progress Notes (Signed)
PROGRESS NOTE                                                                                                                                                                                                             Patient Demographics:    Gregory Wheeler, is a 79 y.o. male, DOB - [birth year], MCN:[id number]  Outpatient Primary MD for the patient is Jacqualine Mau, NP    LOS - 2  Admit date - 06/22/2020    Chief Complaint  Patient presents with  . Covid pos  and Not eating or drinking       Brief Narrative (HPI from H&P)  - Gregory Wheeler is a 79 y.o. male with medical history significant of Spondylosis, gout, GERD, HTn, p A.fib, iron defficiency anemia, heavy alcohol abuse, CAD, HLD, right bundle branch block, history of small bowel obstruction, history of second-degree AV block Mobitz type I who has been sick for around 2 to 3 weeks before seeking help presented to the ER with shortness of breath and weakness.  Was diagnosed with COVID-19 pneumonia along with possible aspiration pneumonia, also had evidence of severe dehydration and AKI and was admitted to the hospital.   Subjective:   Patient in bed, pleasantly confused, appears to be in no distress, denies any headache chest or abdominal pain, no shortness of breath, unreliable historian.   Assessment  & Plan :     1. Acute Hypoxic Resp. Failure due to Acute Covid 19 Viral Pneumonitis during the ongoing 2020 Covid 19 Pandemic -there is also evidence of possible overlying aspiration pneumonia causing bacterial infection.  He is vaccinated and likely has breakthrough infection, he has been started on steroids and Remdesivir along with antibiotics, speech therapy following aspiration precautions.  Overall quite tenuous due to his advanced age and multiple other medical issues.  Encouraged the patient to sit up in chair in the daytime use I-S and flutter valve for pulmonary  toiletry and then prone in bed when at night.  Will advance activity and titrate down oxygen as possible.    SpO2: 97 % O2 Flow Rate (L/min): (S) 1 L/min  Recent Labs  Lab 06/22/20 1143 06/22/20 1144 06/22/20 1405 06/23/20 0140 06/23/20 0626 06/24/20 0650  WBC 17.3*  --   --  6.2  --  11.0*  HGB 13.2  --   --  10.3*  --  11.6*  HCT 39.5  --   --  31.5*  --  35.3*  PLT 283  --   --  231  --  264  CRP  --  11.1*  --  9.0*  --  3.7*  BNP  --   --   --  278.3*  --  366.6*  DDIMER 0.67*  --   --  0.39  --  0.51*  PROCALCITON 0.86  --   --   --  0.52 0.30  AST 31  --   --  31  --  39  ALT 20  --   --  21  --  27  ALKPHOS 55  --   --  48  --  49  BILITOT 1.5*  --   --  1.3*  --  0.8  ALBUMIN 2.5*  --   --  1.7*  --  2.1*  LATICACIDVEN 2.4*  --  1.7  --   --   --   SARSCOV2NAA POSITIVE*  --   --   --   --   --     Aspiration pneumonia-coughing with eating.  Along with metabolic encephalopathy, high risk for aspiration, dysphagia 3 diet speech to evaluate.  Unasyn added.  Severe metabolic encephalopathy.  Supportive care, minimize benzodiazepines and narcotics, monitor for any signs of DTs.    RBBB (right bundle branch block) - chronic stable   Hypertension - continue beta-blocker.   Coronary artery disease -chronic stable continue beta-blocker patient does not seem to be on a statin.   Chronic atrial fibrillation (HCC) - continue Xarelto and beta-blocker .   AKI (acute kidney injury) (HCC) - hydrate with IV fluids and Foley catheter for retention.  AKI improved.  Urinary retention - Flomax and Foley on 06/23/2020.  Unable to void.  Hypokalemia and hypomagnesemia with severe dehydration-  adding to the family members he was sick for at least 2 to 3 days before he came to the hospital, severely dehydrated, IV fluids and replace electrolytes.  Hypoalbuminemia - protein supplementation added.  Debility will have PT OT evaluate prior to discharge other plan as per orders.    RBBB (right bundle branch block) - chronic stable.   Hypertension - continue beta-blocker.   Coronary artery disease - chronic stable continue beta-blocker patient does not seem to be on a statin.   Chronic atrial fibrillation (HCC) - continue Xarelto and beta-blocker when able to tolerate p.o.   Alcohol abuse - Per family drank large amounts of alcohol until around 2 weeks ago, in the last 2 weeks according to them he has had not had any alcohol except half a bottle of beer on Saturday.  Currently no signs of DTs, will place on folic acid and thiamine and monitor.     Condition - Extremely Guarded  Family Communication  :   Argentina Ponder - [phone] - 06/23/20 clearly explained life-threatening condition with extremely grave prognosis, updated 06/24/20  Daughter [phone] on June 23, 2020 clearly explained life-threatening condition with extremely grave prognosis.   Code Status :  Full  Consults  :  None  Procedures  :    TTE -   1. Left ventricular ejection fraction, by estimation, is 60 to 65%. The left ventricle has normal function. The left ventricle has no regional wall motion abnormalities. There is mild left ventricular hypertrophy. Left ventricular diastolic parameters are consistent with Grade I diastolic dysfunction (impaired relaxation).  2. Right ventricular  systolic function is moderately reduced. The right ventricular size is normal. There is normal pulmonary artery systolic pressure. The estimated right ventricular systolic pressure is 33.2 mmHg. Abnormal RV, would consider PE.  3. Right atrial size was mildly dilated.  4. The mitral valve is normal in structure. No evidence of mitral valve regurgitation. No evidence of mitral stenosis.  5. The aortic valve is tricuspid. Aortic valve regurgitation is trivial. Mild aortic valve sclerosis is present, with no evidence of aortic valve stenosis.  6. The inferior vena cava is normal in size with greater than 50%  respiratory variability, suggesting right atrial pressure of 3 mmHg.  CT -  1. No acute intracranial abnormality. 2. Age related atrophy and chronic small vessel ischemia. 3. Opacification of the lower right greater than left mastoid air cells likely mastoid effusion, progressed from 2017. Small fluid levels in the left and right side of the sphenoid sinus.  PUD Prophylaxis :   Disposition Plan  :    Status is: Inpatient  Not inpatient appropriate, will call UM team and downgrade to OBS.   Dispo: The patient is from: Home              Anticipated d/c is to: Home              Anticipated d/c date is: > 3 days              Patient currently is not medically stable to d/c.   DVT Prophylaxis  :  Xaralto  Lab Results  Component Value Date   PLT 264 06/24/2020    Diet :  Diet Order            DIET DYS 3 Room service appropriate? No; Fluid consistency: Honey Thick  Diet effective now                  Inpatient Medications  Scheduled Meds: . allopurinol  300 mg Oral QHS  . vitamin C  500 mg Oral Daily  . Chlorhexidine Gluconate Cloth  6 each Topical Daily  . dexamethasone (DECADRON) injection  6 mg Intravenous Q24H  . folic acid  1 mg Oral Daily  . lactose free nutrition  237 mL Oral TID WC  . metoprolol succinate  50 mg Oral QPM  . nitroGLYCERIN  0.5 inch Topical Q6H  . rivaroxaban  20 mg Oral Q supper  . sodium chloride flush  3 mL Intravenous Q12H  . tamsulosin  0.4 mg Oral Daily  . [START ON 06/25/2020] thiamine  100 mg Oral Daily  . zinc sulfate  220 mg Oral Daily   Continuous Infusions: . ampicillin-sulbactam (UNASYN) IV 3 g (06/24/20 0448)  . dextrose 5 % and 0.45 % NaCl with KCl 20 mEq/L 75 mL/hr at 06/24/20 0333  . lactated ringers Stopped (06/23/20 1933)  . remdesivir 100 mg in NS 100 mL 100 mg (06/24/20 0926)   PRN Meds:.acetaminophen, ALPRAZolam, famotidine, guaiFENesin-dextromethorphan, hydrALAZINE, [DISCONTINUED] ondansetron **OR** ondansetron (ZOFRAN)  IV, Resource ThickenUp Clear  Antibiotics  :    Anti-infectives (From admission, onward)   Start     Dose/Rate Route Frequency Ordered Stop   06/23/20 1430  azithromycin (ZITHROMAX) 500 mg in sodium chloride 0.9 % 250 mL IVPB  Status:  Discontinued        500 mg 250 mL/hr over 60 Minutes Intravenous Every 24 hours 06/22/20 1406 06/22/20 1410   06/23/20 1300  Ampicillin-Sulbactam (UNASYN) 3 g in sodium chloride 0.9 % 100 mL IVPB  3 g 200 mL/hr over 30 Minutes Intravenous Every 8 hours 06/23/20 1131     06/23/20 1000  remdesivir 100 mg in sodium chloride 0.9 % 100 mL IVPB        100 mg 200 mL/hr over 30 Minutes Intravenous Daily 06/22/20 1345 06/27/20 0959   06/23/20 0945  ampicillin (OMNIPEN) 2 g in sodium chloride 0.9 % 100 mL IVPB  Status:  Discontinued        2 g 300 mL/hr over 20 Minutes Intravenous Every 4 hours 06/23/20 0852 06/23/20 1131   06/22/20 1430  azithromycin (ZITHROMAX) 500 mg in sodium chloride 0.9 % 250 mL IVPB  Status:  Discontinued        500 mg 250 mL/hr over 60 Minutes Intravenous Every 24 hours 06/22/20 1410 06/23/20 0902   06/22/20 1400  remdesivir 100 mg in sodium chloride 0.9 % 100 mL IVPB        100 mg 200 mL/hr over 30 Minutes Intravenous Every 30 min 06/22/20 1345 06/22/20 1518   06/22/20 1400  cefTRIAXone (ROCEPHIN) 2 g in sodium chloride 0.9 % 100 mL IVPB  Status:  Discontinued        2 g 200 mL/hr over 30 Minutes Intravenous Every 24 hours 06/22/20 1357 06/23/20 0902   06/22/20 1400  azithromycin (ZITHROMAX) 500 mg in sodium chloride 0.9 % 250 mL IVPB  Status:  Discontinued        500 mg 250 mL/hr over 60 Minutes Intravenous Every 24 hours 06/22/20 1357 06/22/20 1406       Time Spent in minutes  30   Susa RaringPrashant Shamarr Faucett M.D on 06/24/2020 at 9:58 AM  To page go to [email or web address] - password [email or web address]  [facility] -  Office  [phone]   See all Orders from today for further details    Objective:   Vitals:   06/24/20 0423 06/24/20 0448  06/24/20 0522 06/24/20 0800  BP: (!) 174/85 (!) 165/79 (!) 137/45   Pulse: 68  69   Resp: 19  18   Temp: 98.9 F (37.2 C)   (!) 97.5 F (36.4 C)  TempSrc: Axillary   Axillary  SpO2: 94%  97%   Weight:      Height:        Wt Readings from Last 3 Encounters:  06/23/20 111 kg  03/14/20 110.3 kg  09/17/19 106.6 kg     Intake/Output Summary (Last 24 hours) at 06/24/2020 0958 Last data filed at 06/24/2020 0849 Gross per 24 hour  Intake 2099.09 ml  Output 1300 ml  Net 799.09 ml     Physical Exam  Awake but pleasantly confused, moving all 4 extremities, Toco.AT,PERRAL Supple Neck,No JVD, No cervical lymphadenopathy appriciated.  Symmetrical Chest wall movement, Good air movement bilaterally, coarse bilateral breath sounds RRR,No Gallops,Rubs or new Murmurs, No Parasternal Heave +ve B.Sounds, Abd Soft, No tenderness, No organomegaly appriciated, No rebound - guarding or rigidity. No Cyanosis, Clubbing or edema, No new Rash or bruise      Data Review:    CBC Recent Labs  Lab 06/22/20 1143 06/23/20 0140 06/24/20 0650  WBC 17.3* 6.2 11.0*  HGB 13.2 10.3* 11.6*  HCT 39.5 31.5* 35.3*  PLT 283 231 264  MCV 102.6* 103.6* 102.0*  MCH 34.3* 33.9 33.5  MCHC 33.4 32.7 32.9  RDW 14.0 13.9 13.7  LYMPHSABS 1.0 0.5* 0.6*  MONOABS 0.9 0.2 0.4  EOSABS 0.1 0.0 0.0  BASOSABS 0.1 0.0 0.0    Recent Labs  Lab 06/22/20 1143  06/22/20 1144 06/22/20 1405 06/22/20 2235 06/22/20 2236 06/23/20 0140 06/23/20 0626 06/24/20 0650  NA 134*  --   --   --  135 135  --  137  K 3.2*  --   --   --  3.7 3.6  --  3.4*  CL 95*  --   --   --  99 103  --  101  CO2 24  --   --   --  24 23  --  23  GLUCOSE 114*  --   --   --  147* 144*  --  146*  BUN 23  --   --   --  22 22  --  22  CREATININE 1.34*  --   --   --  1.34* 1.22  --  1.07  CALCIUM 8.4*  --   --   --  8.0* 7.9*  --  8.5*  AST 31  --   --   --   --  31  --  39  ALT 20  --   --   --   --  21  --  27  ALKPHOS 55  --   --   --   --   48  --  49  BILITOT 1.5*  --   --   --   --  1.3*  --  0.8  ALBUMIN 2.5*  --   --   --   --  1.7*  --  2.1*  MG  --   --   --   --  1.0* 1.0*  --  1.4*  CRP  --  11.1*  --   --   --  9.0*  --  3.7*  DDIMER 0.67*  --   --   --   --  0.39  --  0.51*  PROCALCITON 0.86  --   --   --   --   --  0.52 0.30  LATICACIDVEN 2.4*  --  1.7  --   --   --   --   --   AMMONIA  --   --   --  11  --   --   --   --   BNP  --   --   --   --   --  278.3*  --  366.6*    ------------------------------------------------------------------------------------------------------------------ Recent Labs    06/22/20 1143  TRIG 165*    No results found for: HGBA1C ------------------------------------------------------------------------------------------------------------------ No results for input(s): TSH, T4TOTAL, T3FREE, THYROIDAB in the last 72 hours.  Invalid input(s): FREET3  Cardiac Enzymes No results for input(s): CKMB, TROPONINI, MYOGLOBIN in the last 168 hours.  Invalid input(s): CK ------------------------------------------------------------------------------------------------------------------    Component Value Date/Time   BNP 366.6 (H) 06/24/2020 1610    Micro Results Recent Results (from the past 240 hour(s))  Blood Culture (routine x 2)     Status: Abnormal (Preliminary result)   Collection Time: 06/22/20 11:41 AM   Specimen: Right Antecubital; Blood  Result Value Ref Range Status   Specimen Description   Final    RIGHT ANTECUBITAL Performed at [facility], [address] [place], [place] [address]    Special Requests   Final    BOTTLES DRAWN AEROBIC AND ANAEROBIC Blood Culture adequate volume Performed at [facility], [address] [place], [place] [address]    Culture  Setup Time   Final    IN BOTH  AEROBIC AND ANAEROBIC BOTTLES GRAM POSITIVE COCCI Organism ID to follow CRITICAL RESULT CALLED TO, READ BACK BY AND VERIFIED WITH: Lana Fish PharmD 8:25  06/23/20 ([email or web address])    Culture (A)  Final    ENTEROCOCCUS FAECALIS SUSCEPTIBILITIES TO FOLLOW Performed at [facility], [address] [place], [place] [address]    Report Status PENDING  Incomplete  Blood Culture ID Panel (Reflexed)     Status: Abnormal   Collection Time: 06/22/20 11:41 AM  Result Value Ref Range Status   Enterococcus faecalis DETECTED (A) NOT DETECTED Final    Comment: CRITICAL RESULT CALLED TO, READ BACK BY AND VERIFIED WITH: Lana Fish PharmD 8:25 06/23/20 ([email or web address])    Enterococcus Faecium NOT DETECTED NOT DETECTED Final   Listeria monocytogenes NOT DETECTED NOT DETECTED Final   Staphylococcus species NOT DETECTED NOT DETECTED Final   Staphylococcus aureus (BCID) NOT DETECTED NOT DETECTED Final   Staphylococcus epidermidis NOT DETECTED NOT DETECTED Final   Staphylococcus lugdunensis NOT DETECTED NOT DETECTED Final   Streptococcus species NOT DETECTED NOT DETECTED Final   Streptococcus agalactiae NOT DETECTED NOT DETECTED Final   Streptococcus pneumoniae NOT DETECTED NOT DETECTED Final   Streptococcus pyogenes NOT DETECTED NOT DETECTED Final   A.calcoaceticus-baumannii NOT DETECTED NOT DETECTED Final   Bacteroides fragilis NOT DETECTED NOT DETECTED Final   Enterobacterales NOT DETECTED NOT DETECTED Final   Enterobacter cloacae complex NOT DETECTED NOT DETECTED Final   Escherichia coli NOT DETECTED NOT DETECTED Final   Klebsiella aerogenes NOT DETECTED NOT DETECTED Final   Klebsiella oxytoca NOT DETECTED NOT DETECTED Final   Klebsiella pneumoniae NOT DETECTED NOT DETECTED Final   Proteus species NOT DETECTED NOT DETECTED Final   Salmonella species NOT DETECTED NOT DETECTED Final   Serratia marcescens NOT DETECTED NOT DETECTED Final   Haemophilus influenzae NOT DETECTED NOT DETECTED Final   Neisseria meningitidis NOT DETECTED NOT DETECTED Final   Pseudomonas aeruginosa NOT DETECTED NOT DETECTED Final   Stenotrophomonas maltophilia NOT DETECTED NOT  DETECTED Final   Candida albicans NOT DETECTED NOT DETECTED Final   Candida auris NOT DETECTED NOT DETECTED Final   Candida glabrata NOT DETECTED NOT DETECTED Final   Candida krusei NOT DETECTED NOT DETECTED Final   Candida parapsilosis NOT DETECTED NOT DETECTED Final   Candida tropicalis NOT DETECTED NOT DETECTED Final   Cryptococcus neoformans/gattii NOT DETECTED NOT DETECTED Final   Vancomycin resistance NOT DETECTED NOT DETECTED Final    Comment: Performed at [facility], [address] [place], [place] [address]  Respiratory Panel by RT PCR (Flu A&B, Covid) - Nasopharyngeal Swab     Status: Abnormal   Collection Time: 06/22/20 11:43 AM   Specimen: Nasopharyngeal Swab  Result Value Ref Range Status   SARS Coronavirus 2 by RT PCR POSITIVE (A) NEGATIVE Final    Comment: RESULT CALLED TO, READ BACK BY AND VERIFIED WITH:  COBLE SAM,RN  ON 06/22/2020, [email or web address] (NOTE) SARS-CoV-2 target nucleic acids are DETECTED.  SARS-CoV-2 RNA is generally detectable in upper respiratory specimens  during the acute phase of infection. Positive results are indicative of the presence of the identified virus, but do not rule out bacterial infection or co-infection with other pathogens not detected by the test. Clinical correlation with patient history and other diagnostic information is necessary to determine patient infection status. The expected result is Negative.  Fact Sheet for Patients:  https://www.moore.com/  Fact Sheet for Healthcare Providers: https://www.young.biz/  This test is not yet approved or cleared by the  [place] [employer] and  has been authorized for detection and/or diagnosis of SARS-CoV-2 by FDA under an TEFL teacher (EUA).  This EUA will remain in effect (meaning this  test can be used) for the duration of  the COVID-19 declaration under Section 564(b)(1) of the Act, 21 U.S.C. section 360bbb-3(b)(1), unless  the authorization is terminated or revoked sooner.      Influenza A by PCR NEGATIVE NEGATIVE Final   Influenza B by PCR NEGATIVE NEGATIVE Final    Comment: (NOTE) The Xpert Xpress SARS-CoV-2/FLU/RSV assay is intended as an aid in  the diagnosis of influenza from Nasopharyngeal swab specimens and  should not be used as a sole basis for treatment. Nasal washings and  aspirates are unacceptable for Xpert Xpress SARS-CoV-2/FLU/RSV  testing.  Fact Sheet for Patients: https://www.moore.com/  Fact Sheet for Healthcare Providers: https://www.young.biz/  This test is not yet approved or cleared by the [place] FDA and  has been authorized for detection and/or diagnosis of SARS-CoV-2 by  FDA under an Emergency Use Authorization (EUA). This EUA will remain  in effect (meaning this test can be used) for the duration of the  Covid-19 declaration under Section 564(b)(1) of the Act, 21  U.S.C. section 360bbb-3(b)(1), unless the authorization is  terminated or revoked. Performed at [facility], [address] [place], [place] [address]   Blood Culture (routine x 2)     Status: Abnormal (Preliminary result)   Collection Time: 06/22/20 11:58 AM   Specimen: Left Antecubital; Blood  Result Value Ref Range Status   Specimen Description   Final    LEFT ANTECUBITAL Performed at [facility], [address] [place], [place] [address]    Special Requests   Final    BOTTLES DRAWN AEROBIC AND ANAEROBIC Blood Culture adequate volume Performed at [facility], [address] [place], [place] [address]    Culture  Setup Time   Final    IN BOTH AEROBIC AND ANAEROBIC BOTTLES GRAM POSITIVE COCCI CRITICAL VALUE NOTED.  VALUE IS CONSISTENT WITH PREVIOUSLY REPORTED AND CALLED VALUE. Performed at [facility], [address], [place], [place] [address]    Culture ENTEROCOCCUS FAECALIS (A)  Final   Report Status PENDING   Incomplete  MRSA PCR Screening     Status: None   Collection Time: 06/23/20  8:08 AM   Specimen: Nasal Mucosa; Nasopharyngeal  Result Value Ref Range Status   MRSA by PCR NEGATIVE NEGATIVE Final    Comment:        The GeneXpert MRSA Assay (FDA approved for NASAL specimens only), is one component of a comprehensive MRSA colonization surveillance program. It is not intended to diagnose MRSA infection nor to guide or monitor treatment for MRSA infections. Performed at [facility], [address], [place], [place] [address]     Radiology Reports CT HEAD WO CONTRAST  Result Date: 06/22/2020 CLINICAL DATA:  Delirium. EXAM: CT HEAD WITHOUT CONTRAST TECHNIQUE: Contiguous axial images were obtained from the base of the skull through the vertex without intravenous contrast. COMPARISON:  Head CT 05/14/2016 FINDINGS: Brain: No intracranial hemorrhage, mass effect, or midline shift. Age related atrophy. No hydrocephalus. The basilar cisterns are patent. Mild to moderate chronic small vessel ischemia with slight progression from 2017. No evidence of territorial infarct or acute ischemia. No extra-axial or intracranial fluid collection. Vascular: Atherosclerosis of skullbase vasculature without hyperdense vessel or abnormal calcification. Skull: No fracture or  focal lesion. Sinuses/Orbits: Opacification of the lower right greater than left mastoid air cells, progressed from 2017. Small fluid levels in the left and right side of sphenoid sinus. No acute orbital abnormality. Bilateral cataract resection. Other: None. IMPRESSION: 1. No acute intracranial abnormality. 2. Age related atrophy and chronic small vessel ischemia. 3. Opacification of the lower right greater than left mastoid air cells likely mastoid effusion, progressed from 2017. Small fluid levels in the left and right side of the sphenoid sinus. Electronically Signed   By: Narda Rutherford M.D.   On: 06/22/2020 22:36   DG Chest Port 1  View  Result Date: 06/23/2020 CLINICAL DATA:  Shortness of breath and COVID. EXAM: PORTABLE CHEST 1 VIEW COMPARISON:  06/22/2020 FINDINGS: 0846 hours. Low lung volumes. Progressive patchy ground-glass and consolidative airspace opacity in the lower lungs. The cardio pericardial silhouette is enlarged. No substantial pleural effusion. The visualized bony structures of the thorax show no acute abnormality. Telemetry leads overlie the chest. IMPRESSION: Progressive bibasilar airspace disease consistent with multifocal pneumonia. Electronically Signed   By: Kennith Center M.D.   On: 06/23/2020 09:04   DG Chest Port 1 View  Result Date: 06/22/2020 CLINICAL DATA:  COVID, altered mental status. EXAM: PORTABLE CHEST 1 VIEW COMPARISON:  10/19/2014. FINDINGS: Low lung volumes. Subtle airspace opacities in the peripheral lung bases. No visible pleural effusions or pneumothorax. Cardiomediastinal silhouette is within normal limits. No evidence of acute osseous abnormality. IMPRESSION: Subtle airspace opacities in the peripheral lung bases, concerning for multifocal pneumonia and consistent with reported COVID diagnosis. Electronically Signed   By: Feliberto Harts MD   On: 06/22/2020 11:39   DG Swallowing Func-Speech Pathology  Result Date: 06/23/2020 Objective Swallowing Evaluation: Type of Study: MBS-Modified Barium Swallow Study  Patient Details Name: SHANTI EICHEL MRN: [id number] Date of Birth: 1941/06/16 Today's Date: 06/23/2020 Time: SLP Start Time (ACUTE ONLY): 1417 -SLP Stop Time (ACUTE ONLY): 1437 SLP Time Calculation (min) (ACUTE ONLY): 20 min Past Medical History: Past Medical History: Diagnosis Date . Arthritis  . Atrial fibrillation (HCC) 01/31/2015 . Chronic gout  . Coronary artery disease 12/2007  STATUS POST STENTING OF THE LEFT ANTERIOR DESCENDING ARTERY . COVID-19  . Dyslipidemia  . Gout  . Hiatal hernia  . Hyperlipidemia  . Hypertension  . LBP (low back pain)  . Partial small bowel obstruction (HCC)   . RBBB (right bundle branch block)  . SBO (small bowel obstruction) (HCC)   PARTIAL . Second degree AV block, Mobitz type I  . Urinary tract infection   Escherichia coli urinary tract infection Past Surgical History: Past Surgical History: Procedure Laterality Date . CARDIAC CATHETERIZATION   . HERNIA REPAIR    INGUINAL . ROTATOR CUFF REPAIR   HPI: 79 yo male presenting to ED with generalized weakness, poor food intake, and confusion. COVID positive. PMH including hiatal hernia, arthritis, A-fib, HTN, AKI, alcohol abuse, and CAD with s/p stenting of L anterior descending artery. CXR Progressive bibasilar airspace disease consistent with multifocal pneumonia.  No data recorded Assessment / Plan / Recommendation CHL IP CLINICAL IMPRESSIONS 06/23/2020 Clinical Impression Pt exhibits mild oropharyngeal dysphagia with aspiration of nectar consistency, and mild vallecular residual. 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 Pt unable to contain entire bolus in oral cavity and moderate amount nectar thick fell over tongue and was aspirated prior to initiation of laryngeal closure (frank volume) with immediate cough. Timing of closure delayed with  thin using chin tuck and slight aspiraiton present. Lingual residue present falling into valleculae post swallow. Chin down posture using straw was effective with nectar however pt displays memory deficit and follow through of strategy is questionable. 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 Recommend honey thick liquids, Dys 3, crush meds. full supervision/assist. SLP will work with pt on chin tuck with nectar for possible upgrade.  SLP Visit Diagnosis Dysphagia, oropharyngeal phase (R13.12) Attention and concentration deficit following -- Frontal lobe and executive function deficit following -- Impact on safety and function Mild aspiration risk;Moderate  aspiration risk   [facility]L IP TREATMENT RECOMMENDATION 06/23/2020 Treatment Recommendations Therapy as outlined in treatment plan below   Prognosis 06/23/2020 Prognosis for Safe Diet Advancement (No Data) Barriers to Reach Goals Cognitive deficits Barriers/Prognosis Comment -- [facility] IP DIET RECOMMENDATION 06/23/2020 SLP Diet Recommendations Dysphagia 3 (Mech soft) solids;Honey thick liquids Liquid Administration via Cup Medication Administration Crushed with puree Compensations Minimize environmental distractions;Slow rate;Small sips/bites Postural Changes Seated upright at 90 degrees;Remain semi-upright after after feeds/meals (Comment)   [facility] IP OTHER RECOMMENDATIONS 06/23/2020 Recommended Consults -- Oral Care Recommendations Oral care BID Other Recommendations --   [facility] IP FOLLOW UP RECOMMENDATIONS 06/23/2020 Follow up Recommendations (No Data)   [facility] IP FREQUENCY AND DURATION 06/23/2020 Speech Therapy Frequency (ACUTE ONLY) min 2x/week Treatment Duration 2 weeks      [facility]L IP ORAL PHASE 06/23/2020 Oral Phase Impaired Oral - Pudding Teaspoon -- Oral - Pudding Cup -- Oral - Honey Teaspoon -- Oral - Honey Cup Lingual/palatal residue;Piecemeal swallowing Oral - Nectar Teaspoon -- Oral - Nectar Cup Decreased bolus cohesion Oral - Nectar Straw Decreased bolus cohesion Oral - Thin Teaspoon -- Oral - Thin Cup -- Oral - Thin Straw -- Oral - Puree Piecemeal swallowing Oral - Mech Soft -- Oral - Regular -- Oral - Multi-Consistency -- Oral - Pill -- Oral Phase - Comment --  [facility] IP PHARYNGEAL PHASE 06/23/2020 Pharyngeal Phase Impaired Pharyngeal- Pudding Teaspoon -- Pharyngeal -- Pharyngeal- Pudding Cup -- Pharyngeal -- Pharyngeal- Honey Teaspoon -- Pharyngeal -- Pharyngeal- Honey Cup Pharyngeal residue - valleculae Pharyngeal -- Pharyngeal- Nectar Teaspoon -- Pharyngeal -- Pharyngeal- Nectar Cup Penetration/Aspiration before swallow Pharyngeal Material enters airway, passes BELOW cords and not ejected out despite cough attempt by  patient Pharyngeal- Nectar Straw Penetration/Aspiration during swallow Pharyngeal Material enters airway, remains ABOVE vocal cords then ejected out;Material does not enter airway Pharyngeal- Thin Teaspoon NT Pharyngeal -- Pharyngeal- Thin Cup NT Pharyngeal -- Pharyngeal- Thin Straw Penetration/Aspiration during swallow Pharyngeal Material enters airway, passes BELOW cords without attempt by patient to eject out (silent aspiration) Pharyngeal- Puree Pharyngeal residue - valleculae Pharyngeal -- Pharyngeal- Mechanical Soft -- Pharyngeal -- Pharyngeal- Regular WFL Pharyngeal -- Pharyngeal- Multi-consistency -- Pharyngeal -- Pharyngeal- Pill -- Pharyngeal -- Pharyngeal Comment --  [facility] IP CERVICAL ESOPHAGEAL PHASE 06/23/2020 Cervical Esophageal Phase WFL Pudding Teaspoon -- Pudding Cup -- Honey Teaspoon -- Honey Cup -- Nectar Teaspoon -- Nectar Cup -- Nectar Straw -- Thin Teaspoon -- Thin Cup -- Thin Straw -- Puree -- Mechanical Soft -- Regular -- Multi-consistency -- Pill -- Cervical Esophageal Comment -- Royce Macadamia 06/23/2020, 4:04 PM Breck Coons [employer]ell [employer] administrator Pager [phone] Office [phone]              ECHOCARDIOGRAM COMPLETE  Result Date: 06/23/2020    ECHOCARDIOGRAM REPORT   Patient Name:   OLAMIDE CARATTINI Date of Exam: 06/23/2020 Medical Rec #:  [id number]  Height:       71.0 in Accession #:    [id number]     Weight:       244.7 lb Date of Birth:  [birth year]      BSA:          2.297 m Patient Age:    79 years       BP:           170/76 mmHg Patient Gender: M              HR:           71 bpm. Exam Location:  Inpatient Procedure: 2D Echo, Cardiac Doppler and Color Doppler Indications:    R07.9* Chest pain, unspecified. Elevated troponin  History:        Patient has prior history of Echocardiogram examinations, most                 recent 02/24/2019. CAD, Abnormal ECG, Arrythmias:RBBB,                 Signs/Symptoms:Hypotension; Risk Factors:Dyslipidemia. Covid 19                  positive. ETOH.  Sonographer:    Sheralyn Boatman RDCS Referring Phys: [id number] ANASTASSIA DOUTOVA  Sonographer Comments: Patient in Fowlaer's position. IMPRESSIONS  1. Left ventricular ejection fraction, by estimation, is 60 to 65%. The left ventricle has normal function. The left ventricle has no regional wall motion abnormalities. There is mild left ventricular hypertrophy. Left ventricular diastolic parameters are consistent with Grade I diastolic dysfunction (impaired relaxation).  2. Right ventricular systolic function is moderately reduced. The right ventricular size is normal. There is normal pulmonary artery systolic pressure. The estimated right ventricular systolic pressure is 33.2 mmHg. Abnormal RV, would consider PE.  3. Right atrial size was mildly dilated.  4. The mitral valve is normal in structure. No evidence of mitral valve regurgitation. No evidence of mitral stenosis.  5. The aortic valve is tricuspid. Aortic valve regurgitation is trivial. Mild aortic valve sclerosis is present, with no evidence of aortic valve stenosis.  6. The inferior vena cava is normal in size with greater than 50% respiratory variability, suggesting right atrial pressure of 3 mmHg. FINDINGS  Left Ventricle: Left ventricular ejection fraction, by estimation, is 60 to 65%. The left ventricle has normal function. The left ventricle has no regional wall motion abnormalities. The left ventricular internal cavity size was normal in size. There is  mild left ventricular hypertrophy. Left ventricular diastolic parameters are consistent with Grade I diastolic dysfunction (impaired relaxation). Right Ventricle: The right ventricular size is normal. No increase in right ventricular wall thickness. Right ventricular systolic function is moderately reduced. There is normal pulmonary artery systolic pressure. The tricuspid regurgitant velocity is 2.75 m/s, and with an assumed right atrial pressure of 3 mmHg, the estimated right  ventricular systolic pressure is 33.2 mmHg. Left Atrium: Left atrial size was normal in size. Right Atrium: Right atrial size was mildly dilated. Pericardium: There is no evidence of pericardial effusion. Mitral Valve: The mitral valve is normal in structure. No evidence of mitral valve regurgitation. No evidence of mitral valve stenosis. Tricuspid Valve: The tricuspid valve is normal in structure. Tricuspid valve regurgitation is trivial. Aortic Valve: The aortic valve is tricuspid. Aortic valve regurgitation is trivial. Mild aortic valve sclerosis is present, with no evidence of aortic valve stenosis. Pulmonic Valve: The pulmonic valve was normal in structure. Pulmonic valve regurgitation is not visualized.  Aorta: The aortic root is normal in size and structure. Venous: The inferior vena cava is normal in size with greater than 50% respiratory variability, suggesting right atrial pressure of 3 mmHg. IAS/Shunts: No atrial level shunt detected by color flow Doppler.  LEFT VENTRICLE PLAX 2D LVIDd:         3.80 cm     Diastology LVIDs:         2.60 cm     LV e' medial:    4.35 cm/s LV PW:         1.70 cm     LV E/e' medial:  16.9 LV IVS:        1.50 cm     LV e' lateral:   5.33 cm/s LVOT diam:     2.30 cm     LV E/e' lateral: 13.8 LV SV:         97 LV SV Index:   42 LVOT Area:     4.15 cm  LV Volumes (MOD) LV vol d, MOD A2C: 78.7 ml LV vol d, MOD A4C: 84.2 ml LV vol s, MOD A2C: 17.5 ml LV vol s, MOD A4C: 25.2 ml LV SV MOD A2C:     61.2 ml LV SV MOD A4C:     84.2 ml LV SV MOD BP:      64.3 ml RIGHT VENTRICLE             IVC RV S prime:     18.50 cm/s  IVC diam: 2.20 cm TAPSE (M-mode): 3.2 cm LEFT ATRIUM             Index       RIGHT ATRIUM           Index LA diam:        3.70 cm 1.61 cm/m  RA Area:     18.30 cm LA Vol (A2C):   34.1 ml 14.84 ml/m RA Volume:   53.10 ml  23.11 ml/m LA Vol (A4C):   49.7 ml 21.63 ml/m LA Biplane Vol: 43.7 ml 19.02 ml/m  AORTIC VALVE LVOT Vmax:   124.00 cm/s LVOT Vmean:  89.800 cm/s  LVOT VTI:    0.233 m  AORTA Ao Root diam: 3.50 cm MITRAL VALVE               TRICUSPID VALVE MV Area (PHT): 3.21 cm    TR Peak grad:   30.2 mmHg MV Decel Time: 236 msec    TR Vmax:        275.00 cm/s MV E velocity: 73.30 cm/s MV A velocity: 99.05 cm/s  SHUNTS MV E/A ratio:  0.74        Systemic VTI:  0.23 m                            Systemic Diam: 2.30 cm Marca Ancona MD Electronically signed by Marca Ancona MD Signature Date/Time: 06/23/2020/4:53:51 PM    Final

## 2020-06-24 NOTE — Significant Event (Signed)
Pt set off bed alarm; pt was trying to crawl over bedrails. Able to reposition pt back in bed, but pt had also pulled off PIV on left AC. Joni Reining, Consulting civil engineer gave pt PRN PO ativan and pt able to take it. This RN and Lequita Halt, NT assisted pt back to bed and reconnected pt back on monitor and O2 via [place].  Placed order for IV team to start an IV d/t difficulty to start one.

## 2020-06-24 NOTE — Progress Notes (Signed)
Physical Therapy Treatment Patient Details Name: Gregory Wheeler MRN: [id number] DOB: [birth year] Today's Date: 06/24/2020    History of Present Illness Pt is 79 yo male presenting to ED with generalized weakness, poor food intake, and confusion. COVID positive. PMH including arthritis, A-fib, HTN, AKI, alcohol abuse, and CAD with s/p stenting of L anterior descending artery.    PT Comments    Pt making good progress.  He was able to increase ambulation on RA with stable O2 and use of RW with min A.  Required min cues for safety and initiation today. Confirmed with family that pt will have 24 hr support at least for 1-2 weeks.     Follow Up Recommendations  Home health PT;Supervision/Assistance - 24 hour     Equipment Recommendations  Rolling walker with 5" wheels;3in1 (PT)    Recommendations for Other Services       Precautions / Restrictions Precautions Precautions: Fall    Mobility  Bed Mobility               General bed mobility comments: Pt sitting in recliner  Transfers Overall transfer level: Needs assistance Equipment used: Rolling walker (2 wheeled) Transfers: Sit to/from Stand Sit to Stand: Min guard;From elevated surface Stand pivot transfers: Min assist       General transfer comment: Min Guard A for safety with power up into standing  Ambulation/Gait Ambulation/Gait assistance: Min guard Gait Distance (Feet): 180 Feet Assistive device: Rolling walker (2 wheeled) Gait Pattern/deviations: Step-through pattern;Decreased stride length;Drifts right/left Gait velocity: decreased   General Gait Details: Drifts R but able to correct, min cues for posture and RW proximity   Optometrist    Modified Rankin (Stroke Patients Only)       Balance Overall balance assessment: Needs assistance Sitting-balance support: No upper extremity supported;Feet supported Sitting balance-Leahy Scale: Good     Standing balance  support: During functional activity;No upper extremity supported;Bilateral upper extremity supported Standing balance-Leahy Scale: Poor Standing balance comment: required RW                            Cognition Arousal/Alertness: Awake/alert Behavior During Therapy: WFL for tasks assessed/performed Overall Cognitive Status: Impaired/Different from baseline Area of Impairment: Orientation;Attention;Memory;Following commands;Safety/judgement;Awareness;Problem solving                 Orientation Level: Disoriented to;Time;Situation Current Attention Level: Sustained Memory: Decreased short-term memory Following Commands: Follows one step commands inconsistently;Follows one step commands with increased time Safety/Judgement: Decreased awareness of safety;Decreased awareness of deficits Awareness: Emergent Problem Solving: Slow processing;Difficulty sequencing;Requires verbal cues General Comments: Pt slightly more aware today. Continues to present with decreased orientation to situation, able to state he is in [facility] hospital. Pt very focused on wanting to leave today and repeating "why won't they let me go sooner?" Pt able to maintain conversation better than at last session, but still presents with poor awareness, problem solving, and safety.  Some limitations due to [facility]      Exercises      General Comments General comments (skin integrity, edema, etc.): Pt on RA with SPO2 typically 91-94% but did drop to 87% for short time. HR 90-102 bpm..  Called and spoke with son, Gregory Wheeler, who reports they plan to provide 24 hr support for the first few weeks back at home.  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      Pertinent Vitals/Pain Pain Assessment: No/denies pain Faces Pain Scale: No hurt    Home Living                      Prior Function            PT Goals (current goals can now be found in the care plan  section) Acute Rehab PT Goals Patient Stated Goal: Get to my truck and go home PT Goal Formulation: With patient Time For Goal Achievement: 07/07/20 Potential to Achieve Goals: Good Progress towards PT goals: Progressing toward goals    Frequency    Min 3X/week      PT Plan Current plan remains appropriate    Co-evaluation              AM-PAC PT "6 Clicks" Mobility   Outcome Measure  Help needed turning from your back to your side while in a flat bed without using bedrails?: A Little Help needed moving from lying on your back to sitting on the side of a flat bed without using bedrails?: A Little Help needed moving to and from a bed to a chair (including a wheelchair)?: A Little Help needed standing up from a chair using your arms (e.g., wheelchair or bedside chair)?: A Little Help needed to walk in hospital room?: A Little Help needed climbing 3-5 steps with a railing? : A Little 6 Click Score: 18    End of Session Equipment Utilized During Treatment: Gait belt Activity Tolerance: Patient tolerated treatment well Patient left: with call bell/phone within reach;in chair;with chair alarm set;Other (comment) (pt asking to not have mittens placed; placed one on L hand (opposite of IV) and checked with RN who confirms can try this) Nurse Communication: Mobility status PT Visit Diagnosis: Unsteadiness on feet (R26.81);Muscle weakness (generalized) (M62.81)     Time: 9798-9211 PT Time Calculation (min) (ACUTE ONLY): 23 min  Charges:  $Gait Training: 8-22 mins                     Anise Salvo, PT Acute Rehab Services Pager [phone] [facility] [phone]     Rayetta Humphrey 06/24/2020, 1:31 PM

## 2020-06-24 NOTE — Progress Notes (Signed)
Occupational Therapy Treatment Patient Details Name: Gregory Wheeler MRN: [id number] DOB: 13-Jan-1941 Today's Date: 06/24/2020    History of present illness Pt is 79 yo male presenting to ED with generalized weakness, poor food intake, and confusion. COVID positive. PMH including arthritis, A-fib, HTN, AKI, alcohol abuse, and CAD with s/p stenting of L anterior descending artery.   OT comments  Pt progressing towards established OT goals. However, continues to present with deceased balance, safety, cognition, and activity tolerance. Pt able to maintain conversation better this session presenting with poor attention. Pt performing functional mobility in hallway with Min guard-Min A +2 and RW. SpO2 maintaining >87% on RA. Pending 24/7 support from family, continue to recommend dc to home with HHOT. Will continue to follow acutely as admitted.    Follow Up Recommendations  Home health OT;Supervision/Assistance - 24 hour (If family unable to provide 24/7 support, will need SNF)    Equipment Recommendations  3 in 1 bedside commode    Recommendations for Other Services PT consult    Precautions / Restrictions Precautions Precautions: Fall Restrictions Weight Bearing Restrictions: No       Mobility Bed Mobility               General bed mobility comments: Pt sitting in recliner  Transfers Overall transfer level: Needs assistance Equipment used: Rolling walker (2 wheeled) Transfers: Sit to/from Stand Sit to Stand: Min guard;From elevated surface         General transfer comment: Min Guard A for safety with power up into standing    Balance Overall balance assessment: Needs assistance Sitting-balance support: No upper extremity supported;Feet supported Sitting balance-Leahy Scale: Good     Standing balance support: During functional activity;No upper extremity supported;Bilateral upper extremity supported Standing balance-Leahy Scale: Poor Standing balance comment:  required RW                           ADL either performed or assessed with clinical judgement   ADL Overall ADL's : Needs assistance/impaired                         Toilet Transfer: Min guard;Ambulation;RW (simulated to recliner) Toilet Transfer Details (indicate cue type and reason): Min Guard A for safety         Functional mobility during ADLs: Min guard;Minimal assistance;Rolling walker General ADL Comments: Continues to present with decreased cognition, balance, and activity tolerance.      Vision       Perception     Praxis      Cognition Arousal/Alertness: Awake/alert Behavior During Therapy: WFL for tasks assessed/performed Overall Cognitive Status: Impaired/Different from baseline Area of Impairment: Orientation;Attention;Memory;Following commands;Safety/judgement;Awareness;Problem solving                 Orientation Level: Disoriented to;Time;Situation Current Attention Level: Sustained Memory: Decreased short-term memory Following Commands: Follows one step commands inconsistently;Follows one step commands with increased time Safety/Judgement: Decreased awareness of safety;Decreased awareness of deficits Awareness: Emergent Problem Solving: Slow processing;Difficulty sequencing;Requires verbal cues General Comments: Pt slightly more aware today. Continues to present with decreased orientation to situation, able to state he is in [facility] hospital. Pt very focused on wanting to leave today and repeating "why won't they let me go sooner?" Pt able to maintain conversation better than at last OT session, but still presents with poor awareness, problem solving, and safety.         Exercises  Shoulder Instructions       General Comments SpO2 91-94% on RA (lowest 87% with good pleth). HR 90-102.     Pertinent Vitals/ Pain       Faces Pain Scale: No hurt  Home Living                                           Prior Functioning/Environment              Frequency  Min 2X/week        Progress Toward Goals  OT Goals(current goals can now be found in the care plan section)  Progress towards OT goals: Progressing toward goals  Acute Rehab OT Goals Patient Stated Goal: Get to my truck and go home OT Goal Formulation: With patient Time For Goal Achievement: 07/07/20 Potential to Achieve Goals: Good ADL Goals Pt Will Perform Grooming: with min guard assist;standing Pt Will Perform Lower Body Dressing: with min guard assist;sit to/from stand Pt Will Transfer to Toilet: with min guard assist;bedside commode;ambulating Additional ADL Goal #1: Pt will follow 75% of simple one step cues during ADLs  Plan Discharge plan remains appropriate    Co-evaluation                 AM-PAC OT "6 Clicks" Daily Activity     Outcome Measure   Help from another person eating meals?: Total Help from another person taking care of personal grooming?: A Little Help from another person toileting, which includes using toliet, bedpan, or urinal?: A Lot Help from another person bathing (including washing, rinsing, drying)?: A Lot Help from another person to put on and taking off regular upper body clothing?: A Little Help from another person to put on and taking off regular lower body clothing?: A Lot 6 Click Score: 13    End of Session Equipment Utilized During Treatment: Rolling walker;Gait belt  OT Visit Diagnosis: Unsteadiness on feet (R26.81);Other abnormalities of gait and mobility (R26.89);Muscle weakness (generalized) (M62.81)   Activity Tolerance Patient tolerated treatment well   Patient Left in chair;with call bell/phone within reach;with chair alarm set;with restraints reapplied   Nurse Communication Mobility status;Other (comment) (SpO2)        Time: 5366-4403 OT Time Calculation (min): 23 min  Charges: OT General Charges $OT Visit: 1 Visit OT Treatments $Therapeutic  Activity: 8-22 mins  Ankith Edmonston MSOT, OTR/L Acute Rehab Pager: [phone] Office: [phone]   Theodoro Grist Janna Oak 06/24/2020, 12:18 PM

## 2020-06-24 NOTE — TOC Initial Note (Signed)
Transition of Care [facility]) - Initial/Assessment Note    Patient Details  Name: Gregory Wheeler MRN: [id number] Date of Birth: Jan 04, 1941  Transition of [facility] CM/SW Contact:    Kermit Balo, RN Phone Number: 06/24/2020, 12:42 PM  Clinical Narrative:                 Pt with some confusion so CM reached to his son Gregory Wheeler for d/c planning. Gregory Wheeler says the pt was living alone but at d/c he either will have family staying with him or he will d/c to sons home. Son without preference on [facility] services. Cory with [facility] accepted the referral. Pt will need 3 in 1 delivered at time of d/c.  Son will provide transport home when medically ready.  TOC following for further d/c needs.   Expected Discharge Plan: Home w Home Health Services Barriers to Discharge: Continued Medical Work up   Patient Goals and CMS Choice   CMS Medicare.gov Compare Post Acute Care list provided to:: Patient Represenative (must comment) Choice offered to / list presented to : Adult Children  Expected Discharge Plan and Services Expected Discharge Plan: Home w Home Health Services   Discharge Planning Services: CM Consult Post Acute Care Choice: Home Health, Durable Medical Equipment Living arrangements for the past 2 months: Single Family Home                 DME Arranged: 3-N-1 DME Agency: [employer]       HH Arranged: PT, OT HH Agency: [facility] Date [facility] Agency Contacted: 06/24/20   Representative spoke with at [facility] Agency: Kandee Keen  Prior Living Arrangements/Services Living arrangements for the past 2 months: Single Family Home Lives with:: Self Patient language and need for interpreter reviewed:: Yes Do you feel safe going back to the place where you live?: Yes      Need for Family Participation in Patient Care: Yes (Comment) Care giver support system in place?: Yes (comment) Current home services: DME ([facility]) Criminal Activity/Legal Involvement Pertinent to Current  Situation/Hospitalization: No - Comment as needed  Activities of Daily Living      Permission Sought/Granted                  Emotional Assessment         Alcohol / Substance Use: Alcohol Use Psych Involvement: No (comment)  Admission diagnosis:  Hypokalemia [E87.6] Lactic acidosis [E87.2] Encephalopathy acute [G93.40] Pneumonia due to COVID-19 virus [U07.1, J12.82] COVID-19 [U07.1] Patient Active Problem List   Diagnosis Date Noted  . Pneumonia due to COVID-19 virus 06/22/2020  . Dehydration 06/22/2020  . Acute respiratory failure due to COVID-19 (HCC) 06/22/2020  . Hypokalemia 06/22/2020  . Acute metabolic encephalopathy 06/22/2020  . 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 Long term (current) use of anticoagulants [Z79.01] 02/22/2016  . Hypotension 12/21/2015  . Fatigue 12/21/2015  . Alcohol abuse 02/04/2015  . Infected prepatellar bursa 02/03/2015  . Effusion of right knee 02/03/2015  . AKI (acute kidney injury) (HCC) 02/03/2015  . Chronic atrial fibrillation (HCC) 02/03/2015  . Knee effusion 02/03/2015  . Atrial fibrillation (HCC) 01/31/2015  . Gout 01/31/2015  . Second degree AV block, Mobitz type I 12/09/2013  . RBBB (right bundle branch block) 01/03/2011  . Hypertension   . Hyperlipidemia   . SBO (small bowel obstruction) (HCC)   . LBP (low back pain)   .  Arthritis   . Gout   . Coronary artery disease 12/26/2007   PCP:  Jacqualine Mau, NP Pharmacy:   [facility] [phone] - [place],  - [address] AT [facility] [address] [place] [address] Phone: [phone] Fax: [phone]     Social Determinants of Health (SDOH) Interventions    Readmission Risk Interventions No flowsheet data found.

## 2020-06-24 NOTE — Progress Notes (Signed)
    [facility]   Reason for visit: Follow up on Enterococcal bacteremia  Interval History: TTE without obvious vegetation.   Physical Exam: Constitutional:  Vitals:   06/24/20 0522 06/24/20 0800  BP: (!) 137/45   Pulse: 69   Resp: 18   Temp:  (!) 97.5 F (36.4 C)  SpO2: 97%     Impression/plan: he is on Unasyn with concern for aspiration pneumonia in addition to positive Enterococcus in all blood cultures.  Repeat cultures sent.   He will need a TEE when able but with CoVID, will not be for awhile.  I will add ceftriaxone for the possibility.   Dr. Daiva Eves on over the weekend if needed.

## 2020-06-25 ENCOUNTER — Inpatient Hospital Stay: Payer: Self-pay

## 2020-06-25 DIAGNOSIS — G9340 Encephalopathy, unspecified: Secondary | ICD-10-CM | POA: Diagnosis not present

## 2020-06-25 DIAGNOSIS — U071 COVID-19: Secondary | ICD-10-CM | POA: Diagnosis not present

## 2020-06-25 DIAGNOSIS — E876 Hypokalemia: Secondary | ICD-10-CM | POA: Diagnosis not present

## 2020-06-25 DIAGNOSIS — E872 Acidosis: Secondary | ICD-10-CM | POA: Diagnosis not present

## 2020-06-25 LAB — COMPREHENSIVE METABOLIC PANEL
ALT: 41 U/L (ref 0–44)
AST: 62 U/L — ABNORMAL HIGH (ref 15–41)
Albumin: 2 g/dL — ABNORMAL LOW (ref 3.5–5.0)
Alkaline Phosphatase: 47 U/L (ref 38–126)
Anion gap: 10 (ref 5–15)
BUN: 19 mg/dL (ref 8–23)
CO2: 25 mmol/L (ref 22–32)
Calcium: 8.6 mg/dL — ABNORMAL LOW (ref 8.9–10.3)
Chloride: 103 mmol/L (ref 98–111)
Creatinine, Ser: 1.05 mg/dL (ref 0.61–1.24)
GFR, Estimated: >60 mL/min (ref >60)
Glucose, Bld: 130 mg/dL — ABNORMAL HIGH (ref 70–99)
Potassium: 3.3 mmol/L — ABNORMAL LOW (ref 3.5–5.1)
Sodium: 138 mmol/L (ref 135–145)
Total Bilirubin: 0.6 mg/dL (ref 0.3–1.2)
Total Protein: 5.2 g/dL — ABNORMAL LOW (ref 6.5–8.1)

## 2020-06-25 LAB — CBC WITH DIFFERENTIAL/PLATELET
Abs Immature Granulocytes: 0.11 10*3/uL — ABNORMAL HIGH (ref 0.00–0.07)
Basophils Absolute: 0 10*3/uL (ref 0.0–0.1)
Basophils Relative: 0 %
Eosinophils Absolute: 0 10*3/uL (ref 0.0–0.5)
Eosinophils Relative: 0 %
HCT: 30.6 % — ABNORMAL LOW (ref 39.0–52.0)
Hemoglobin: 10.3 g/dL — ABNORMAL LOW (ref 13.0–17.0)
Immature Granulocytes: 1 %
Lymphocytes Relative: 4 %
Lymphs Abs: 0.4 10*3/uL — ABNORMAL LOW (ref 0.7–4.0)
MCH: 34.7 pg — ABNORMAL HIGH (ref 26.0–34.0)
MCHC: 33.7 g/dL (ref 30.0–36.0)
MCV: 103 fL — ABNORMAL HIGH (ref 80.0–100.0)
Monocytes Absolute: 0.5 10*3/uL (ref 0.1–1.0)
Monocytes Relative: 4 %
Neutro Abs: 9.7 10*3/uL — ABNORMAL HIGH (ref 1.7–7.7)
Neutrophils Relative %: 91 %
Platelets: 277 10*3/uL (ref 150–400)
RBC: 2.97 MIL/uL — ABNORMAL LOW (ref 4.22–5.81)
RDW: 13.9 % (ref 11.5–15.5)
WBC: 10.8 10*3/uL — ABNORMAL HIGH (ref 4.0–10.5)
nRBC: 0 % (ref 0.0–0.2)

## 2020-06-25 LAB — CULTURE, BLOOD (ROUTINE X 2)
Special Requests: ADEQUATE
Special Requests: ADEQUATE

## 2020-06-25 LAB — D-DIMER, QUANTITATIVE: D-Dimer, Quant: 0.47 ug/mL-FEU (ref 0.00–0.50)

## 2020-06-25 LAB — C-REACTIVE PROTEIN: CRP: 1.7 mg/dL — ABNORMAL HIGH (ref <1.0)

## 2020-06-25 LAB — BRAIN NATRIURETIC PEPTIDE: B Natriuretic Peptide: 328.9 pg/mL — ABNORMAL HIGH (ref 0.0–100.0)

## 2020-06-25 LAB — PROCALCITONIN: Procalcitonin: 0.19 ng/mL

## 2020-06-25 LAB — MAGNESIUM: Magnesium: 1.3 mg/dL — ABNORMAL LOW (ref 1.7–2.4)

## 2020-06-25 MED ORDER — LORAZEPAM 2 MG/ML IJ SOLN
2.0000 mg | Freq: Once | INTRAMUSCULAR | Status: AC
  Administered 2020-06-26: 2 mg via INTRAVENOUS
  Filled 2020-06-25: qty 1

## 2020-06-25 MED ORDER — THIAMINE HCL 100 MG PO TABS
100.0000 mg | ORAL_TABLET | Freq: Every day | ORAL | Status: DC
  Filled 2020-06-25: qty 1

## 2020-06-25 MED ORDER — LORAZEPAM 1 MG PO TABS: 1.0000 mg | ORAL_TABLET | ORAL | Status: DC | PRN

## 2020-06-25 MED ORDER — POTASSIUM CHLORIDE CRYS ER 20 MEQ PO TBCR
40.0000 meq | EXTENDED_RELEASE_TABLET | Freq: Once | ORAL | Status: AC
  Administered 2020-06-25: 40 meq via ORAL
  Filled 2020-06-25: qty 2

## 2020-06-25 MED ORDER — ADULT MULTIVITAMIN W/MINERALS CH
1.0000 | ORAL_TABLET | Freq: Every day | ORAL | Status: DC
  Administered 2020-06-26: 1 via ORAL
  Filled 2020-06-25 (×2): qty 1

## 2020-06-25 MED ORDER — FOLIC ACID 1 MG PO TABS: 1.0000 mg | ORAL_TABLET | Freq: Every day | ORAL | Status: DC

## 2020-06-25 MED ORDER — SODIUM CHLORIDE 0.9 % IV SOLN
3.0000 g | Freq: Four times a day (QID) | INTRAVENOUS | Status: DC
  Administered 2020-06-25 – 2020-06-27 (×7): 3 g via INTRAVENOUS
  Filled 2020-06-25: qty 3
  Filled 2020-06-25 (×2): qty 8
  Filled 2020-06-25 (×3): qty 3
  Filled 2020-06-25 (×3): qty 8

## 2020-06-25 MED ORDER — LORAZEPAM 2 MG/ML IJ SOLN
1.0000 mg | INTRAMUSCULAR | Status: DC | PRN
  Administered 2020-06-26: 1 mg via INTRAVENOUS
  Administered 2020-06-26: 4 mg via INTRAVENOUS
  Administered 2020-06-26 – 2020-06-27 (×4): 2 mg via INTRAVENOUS
  Filled 2020-06-25 (×2): qty 1
  Filled 2020-06-25: qty 2
  Filled 2020-06-25 (×3): qty 1

## 2020-06-25 MED ORDER — MAGNESIUM SULFATE 2 GM/50ML IV SOLN
2.0000 g | Freq: Once | INTRAVENOUS | Status: AC
  Administered 2020-06-25: 2 g via INTRAVENOUS
  Filled 2020-06-25: qty 50

## 2020-06-25 MED ORDER — AMLODIPINE BESYLATE 10 MG PO TABS
10.0000 mg | ORAL_TABLET | Freq: Every day | ORAL | Status: DC
  Administered 2020-06-25 – 2020-06-26 (×2): 10 mg via ORAL
  Filled 2020-06-25 (×3): qty 1

## 2020-06-25 MED ORDER — THIAMINE HCL 100 MG/ML IJ SOLN: 100.0000 mg | Freq: Every day | INTRAMUSCULAR | Status: DC

## 2020-06-25 MED ORDER — HALOPERIDOL LACTATE 5 MG/ML IJ SOLN
2.0000 mg | Freq: Four times a day (QID) | INTRAMUSCULAR | Status: DC | PRN
  Administered 2020-06-27 – 2020-06-28 (×5): 2 mg via INTRAVENOUS
  Filled 2020-06-25 (×5): qty 1

## 2020-06-25 NOTE — Progress Notes (Signed)
Pt became very agitated early in the shift, exclaiming that nothing was being done for him and that he was going home. Called pt son and pt still adamant about leaving and refusing meds. Paged MD on call, received orders for ativan and haldol to help pt relax. NT sitting at bedside, Ativan1mg  IV given at 2210, pt calmer but still pulling at lines at 0000. Paged doc again, ok to give haldol 2mg  IM, pt still fidgiting, NT at bedside, will continue to monitor.

## 2020-06-25 NOTE — Progress Notes (Signed)
Rebekah RN notified that PICC will not be placed  Today.  RN to obtain consent if able.

## 2020-06-25 NOTE — Progress Notes (Signed)
°  Speech Language Pathology Treatment: Dysphagia  Patient Details Name: Gregory Wheeler MRN: [id number] DOB: 1941-01-02 Today's Date: 06/25/2020 Time: 1200-1215 SLP Time Calculation (min) (ACUTE ONLY): 15 min  Assessment / Plan / Recommendation Clinical Impression  Pt seen for dysphagia treatment requiring sitter due to impulsivity/refusing meds/adamant about leaving last night. He was sitting in chair fidgeting with things around him. He needed encouragement to consume solid trial and honey thick juice which he did without s/s aspiration. Plan was to practice a chin tuck position with upgraded nectar thick liquids however he would need constant cueing due to memory challenges. It is safer to continue honey thick liquids and depending on discharge date either repeat MBS or educate family re: thickened liquids for home. ST will follow.    HPI HPI: 79 yo male presenting to ED with generalized weakness, poor food intake, and confusion. COVID positive. PMH including hiatal hernia, arthritis, A-fib, HTN, AKI, alcohol abuse, and CAD with s/p stenting of L anterior descending artery. CXR Progressive bibasilar airspace disease consistent with multifocal pneumonia.      SLP Plan  Continue with current plan of care       Recommendations  Diet recommendations: Dysphagia 3 (mechanical soft);Honey-thick liquid Liquids provided via: Cup Medication Administration: Crushed with puree Supervision: Patient able to self feed;Full supervision/cueing for compensatory strategies Compensations: Minimize environmental distractions;Slow rate;Small sips/bites;Lingual sweep for clearance of pocketing Postural Changes and/or Swallow Maneuvers: Seated upright 90 degrees                Oral Care Recommendations: Oral care BID Follow up Recommendations: Home health SLP SLP Visit Diagnosis: Dysphagia, oropharyngeal phase (R13.12) Plan: Continue with current plan of care       GO                Royce Macadamia 06/25/2020, 12:32 PM   Breck Coons Lonell Face.Ed Nurse, children's [phone] Office [phone]

## 2020-06-25 NOTE — Progress Notes (Signed)
Pharmacy Antibiotic Note  Gregory Wheeler is a 79 y.o. male admitted on 06/22/2020 with COVID-19 infection, possible aspiration pneumonia and possible aspiration pneumonia.  Pharmacy has been consulted for Unasyn dosing.  ID is following.  10/27 BCx 4/4 with GPC's and BCID showed vancomycin sensitive enterococcus faecalis.  Unasyn will provide coverage for both aspiration pneumonia and enterococcus in the blood.    CrCl ~73, we will increase unasyn dose for empiric endocarditis coverage with ceftriaxone.   Plan:   Increase Unasyn 3g IV q6hours Monitor cultures, clinical improvement, future abx plans  Height: 5\' 11"  (180.3 cm) Weight: 111 kg (244 lb 11.4 oz) IBW/kg (Calculated) : 75.3  Temp (24hrs), Avg:97.7 F (36.5 C), Min:97.6 F (36.4 C), Max:97.8 F (36.6 C)  Recent Labs  Lab 06/22/20 1143 06/22/20 1405 06/22/20 2236 06/23/20 0140 06/24/20 0650 06/25/20 0133  WBC 17.3*  --   --  6.2 11.0* 10.8*  CREATININE 1.34*  --  1.34* 1.22 1.07 1.05  LATICACIDVEN 2.4* 1.7  --   --   --   --     Estimated Creatinine Clearance: 73.5 mL/min (by C-G formula based on SCr of 1.05 mg/dL).    No Known Allergies  Antimicrobials this admission: Azithromycin 10/27 >> 10/28 Ceftriaxone 10/27 >>  Ampicillin 10/28 > 10/28 Unasyn 10/28 >>   Microbiology results: 10/27 BCx: 4/4 GPC, BCID vanc sensitive enterococcus faecalis 10/28 MRSA PCR: neg 10//29 blood>>  08-10-1985, PharmD, BCIDP, AAHIVP, CPP Infectious Disease Pharmacist 06/25/2020 2:21 PM

## 2020-06-25 NOTE — Progress Notes (Signed)
PROGRESS NOTE                                                                                                                                                                                                             Patient Demographics:    Gregory Wheeler, is a 79 y.o. male, DOB - [birth year], AVW:[id number]  Outpatient Primary MD for the patient is Jacqualine Mau, NP    LOS - 3  Admit date - 06/22/2020    Chief Complaint  Patient presents with  . Covid pos  and Not eating or drinking       Brief Narrative (HPI from H&P)  - Gregory Wheeler is a 79 y.o. male with medical history significant of Spondylosis, gout, GERD, HTn, p A.fib, iron defficiency anemia, heavy alcohol abuse, CAD, HLD, right bundle branch block, history of small bowel obstruction, history of second-degree AV block Mobitz type I who has been sick for around 2 to 3 weeks before seeking help presented to the ER with shortness of breath and weakness.  Was diagnosed with COVID-19 pneumonia along with possible aspiration pneumonia, also had evidence of severe dehydration and AKI and was admitted to the hospital.   Subjective:   Patient in bed, appears comfortable, denies any headache, no fever, no chest pain or pressure, no shortness of breath , no abdominal pain. No focal weakness. Unreliable historian - confused.   Assessment  & Plan :     1. Acute Hypoxic Resp. Failure due to Acute Covid 19 Viral Pneumonitis during the ongoing 2020 Covid 19 Pandemic -there is also evidence of possible overlying aspiration pneumonia causing bacterial infection.  He is vaccinated and likely has breakthrough infection, he has been started on steroids and Remdesivir along with antibiotics, speech therapy following aspiration precautions.  Overall quite tenuous due to his advanced age and multiple other medical issues.  Encouraged the patient to sit up in chair in the daytime use I-S  and flutter valve for pulmonary toiletry and then prone in bed when at night.  Will advance activity and titrate down oxygen as possible.    SpO2: 95 % O2 Flow Rate (L/min): (S) 1 L/min  Recent Labs  Lab 06/22/20 1143 06/22/20 1144 06/22/20 1405 06/23/20 0140 06/23/20 0626 06/24/20 0650 06/25/20 0133  WBC 17.3*  --   --  6.2  --  11.0* 10.8*  HGB 13.2  --   --  10.3*  --  11.6* 10.3*  HCT 39.5  --   --  31.5*  --  35.3* 30.6*  PLT 283  --   --  231  --  264 277  CRP  --  11.1*  --  9.0*  --  3.7* 1.7*  BNP  --   --   --  278.3*  --  366.6* 328.9*  DDIMER 0.67*  --   --  0.39  --  0.51* 0.47  PROCALCITON 0.86  --   --   --  0.52 0.30 0.19  AST 31  --   --  31  --  39 62*  ALT 20  --   --  21  --  27 41  ALKPHOS 55  --   --  48  --  49 47  BILITOT 1.5*  --   --  1.3*  --  0.8 0.6  ALBUMIN 2.5*  --   --  1.7*  --  2.1* 2.0*  LATICACIDVEN 2.4*  --  1.7  --   --   --   --   SARSCOV2NAA POSITIVE*  --   --   --   --   --   --      Aspiration pneumonia-coughing with eating.  Along with metabolic encephalopathy, high risk for aspiration, dysphagia 3 diet speech to evaluate. Unasyn.  Enterococcus bacteremia.  ID following.  No clear source, poor candidate for TEE.  Per ID prolonged IV antibiotics for now.   Currently on Rocephin Unasyn finally should be on Rocephin/ampicillin, will get PICC line.   Severe metabolic encephalopathy.  Supportive care, minimize benzodiazepines and narcotics, monitor for any signs of DTs.    RBBB (right bundle branch block) - chronic stable   Hypertension - continue beta-blocker.   Coronary artery disease -chronic stable continue beta-blocker patient does not seem to be on a statin.   Chronic atrial fibrillation (HCC) - continue Xarelto and beta-blocker .   AKI (acute kidney injury) (HCC) - hydrate with IV fluids and Foley catheter for retention.  AKI improved.  Urinary retention - Flomax and Foley on 06/23/2020.  Unable to void.  Hypokalemia  and hypomagnesemia with severe dehydration-  adding to the family members he was sick for at least 2 to 3 days before he came to the hospital, severely dehydrated, IV fluids and replace electrolytes.  Hypoalbuminemia - protein supplementation added.  Debility will have PT OT evaluate prior to discharge other plan as per orders.  Family wants to take him home with home health PT and RN.   Alcohol abuse - Per family drank large amounts of alcohol until around 2 weeks ago, in the last 2 weeks according to them he has had not had any alcohol except half a bottle of beer on Saturday.  Currently no signs of DTs, will place on folic acid and thiamine and monitor.     Condition - Extremely Guarded  Family Communication  :   Argentina Ponder - [phone] - 06/23/20 clearly explained life-threatening condition with extremely grave prognosis, updated 06/24/20, 06/25/20  Daughter [phone] on June 23, 2020 clearly explained life-threatening condition with extremely grave prognosis.   Code Status :  Full  Consults  :  None  Procedures  :    TTE -   1. Left ventricular ejection fraction, by estimation, is 60 to 65%. The left ventricle has normal function. The left ventricle  has no regional wall motion abnormalities. There is mild left ventricular hypertrophy. Left ventricular diastolic parameters are consistent with Grade I diastolic dysfunction (impaired relaxation).  2. Right ventricular systolic function is moderately reduced. The right ventricular size is normal. There is normal pulmonary artery systolic pressure. The estimated right ventricular systolic pressure is 33.2 mmHg. Abnormal RV, would consider PE.  3. Right atrial size was mildly dilated.  4. The mitral valve is normal in structure. No evidence of mitral valve regurgitation. No evidence of mitral stenosis.  5. The aortic valve is tricuspid. Aortic valve regurgitation is trivial. Mild aortic valve sclerosis is present, with no evidence  of aortic valve stenosis.  6. The inferior vena cava is normal in size with greater than 50% respiratory variability, suggesting right atrial pressure of 3 mmHg.  CT -  1. No acute intracranial abnormality. 2. Age related atrophy and chronic small vessel ischemia. 3. Opacification of the lower right greater than left mastoid air cells likely mastoid effusion, progressed from 2017. Small fluid levels in the left and right side of the sphenoid sinus.  PUD Prophylaxis :   Disposition Plan  :    Status is: Inpatient  Not inpatient appropriate, will call UM team and downgrade to OBS.   Dispo: The patient is from: Home              Anticipated d/c is to: Home              Anticipated d/c date is: > 3 days              Patient currently is not medically stable to d/c.   DVT Prophylaxis  :  Xaralto  Lab Results  Component Value Date   PLT 277 06/25/2020    Diet :  Diet Order            DIET DYS 3 Room service appropriate? No; Fluid consistency: Honey Thick  Diet effective now                  Inpatient Medications  Scheduled Meds: . allopurinol  300 mg Oral QHS  . amLODipine  10 mg Oral Daily  . vitamin C  500 mg Oral Daily  . Chlorhexidine Gluconate Cloth  6 each Topical Daily  . dexamethasone (DECADRON) injection  6 mg Intravenous Q24H  . folic acid  1 mg Oral Daily  . lactose free nutrition  237 mL Oral TID WC  . metoprolol succinate  50 mg Oral QPM  . nitroGLYCERIN  0.5 inch Topical Q6H  . rivaroxaban  20 mg Oral Q supper  . sodium chloride flush  3 mL Intravenous Q12H  . tamsulosin  0.4 mg Oral Daily  . thiamine  100 mg Oral Daily  . zinc sulfate  220 mg Oral Daily   Continuous Infusions: . ampicillin-sulbactam (UNASYN) IV 3 g (06/25/20 5329)  . cefTRIAXone (ROCEPHIN)  IV 2 g (06/25/20 9242)  . lactated ringers Stopped (06/23/20 1933)  . magnesium sulfate bolus IVPB 2 g (06/25/20 0937)  . remdesivir 100 mg in NS 100 mL 100 mg (06/25/20 0937)   PRN  Meds:.acetaminophen, ALPRAZolam, famotidine, guaiFENesin-dextromethorphan, hydrALAZINE, [DISCONTINUED] ondansetron **OR** ondansetron (ZOFRAN) IV, Resource ThickenUp Clear  Antibiotics  :    Anti-infectives (From admission, onward)   Start     Dose/Rate Route Frequency Ordered Stop   06/24/20 2200  cefTRIAXone (ROCEPHIN) 2 g in sodium chloride 0.9 % 100 mL IVPB  2 g 200 mL/hr over 30 Minutes Intravenous Every 12 hours 06/24/20 1513     06/24/20 1230  cefTRIAXone (ROCEPHIN) 2 g in sodium chloride 0.9 % 100 mL IVPB  Status:  Discontinued        2 g 200 mL/hr over 30 Minutes Intravenous Every 24 hours 06/24/20 1139 06/24/20 1513   06/23/20 1430  azithromycin (ZITHROMAX) 500 mg in sodium chloride 0.9 % 250 mL IVPB  Status:  Discontinued        500 mg 250 mL/hr over 60 Minutes Intravenous Every 24 hours 06/22/20 1406 06/22/20 1410   06/23/20 1300  Ampicillin-Sulbactam (UNASYN) 3 g in sodium chloride 0.9 % 100 mL IVPB        3 g 200 mL/hr over 30 Minutes Intravenous Every 8 hours 06/23/20 1131     06/23/20 1000  remdesivir 100 mg in sodium chloride 0.9 % 100 mL IVPB        100 mg 200 mL/hr over 30 Minutes Intravenous Daily 06/22/20 1345 06/27/20 0959   06/23/20 0945  ampicillin (OMNIPEN) 2 g in sodium chloride 0.9 % 100 mL IVPB  Status:  Discontinued        2 g 300 mL/hr over 20 Minutes Intravenous Every 4 hours 06/23/20 0852 06/23/20 1131   06/22/20 1430  azithromycin (ZITHROMAX) 500 mg in sodium chloride 0.9 % 250 mL IVPB  Status:  Discontinued        500 mg 250 mL/hr over 60 Minutes Intravenous Every 24 hours 06/22/20 1410 06/23/20 0902   06/22/20 1400  remdesivir 100 mg in sodium chloride 0.9 % 100 mL IVPB        100 mg 200 mL/hr over 30 Minutes Intravenous Every 30 min 06/22/20 1345 06/22/20 1518   06/22/20 1400  cefTRIAXone (ROCEPHIN) 2 g in sodium chloride 0.9 % 100 mL IVPB  Status:  Discontinued        2 g 200 mL/hr over 30 Minutes Intravenous Every 24 hours 06/22/20 1357  06/23/20 0902   06/22/20 1400  azithromycin (ZITHROMAX) 500 mg in sodium chloride 0.9 % 250 mL IVPB  Status:  Discontinued        500 mg 250 mL/hr over 60 Minutes Intravenous Every 24 hours 06/22/20 1357 06/22/20 1406       Time Spent in minutes  30   Susa Raring M.D on 06/25/2020 at 10:14 AM  To page go to [email or web address] - password [email or web address]  [facility] -  Office  [phone]   See all Orders from today for further details    Objective:   Vitals:   06/24/20 2017 06/25/20 0000 06/25/20 0433 06/25/20 0806  BP: (!) 181/76 (!) 180/91 (!) 170/99 (!) 159/76  Pulse: 75 69 75 82  Resp: 20 17 19  (!) 25  Temp: 97.8 F (36.6 C) 97.6 F (36.4 C) 97.8 F (36.6 C) 97.6 F (36.4 C)  TempSrc: Axillary Axillary Axillary Axillary  SpO2: 94% 94% 95% 95%  Weight:      Height:        Wt Readings from Last 3 Encounters:  06/23/20 111 kg  03/14/20 110.3 kg  09/17/19 106.6 kg     Intake/Output Summary (Last 24 hours) at 06/25/2020 1014 Last data filed at 06/24/2020 2349 Gross per 24 hour  Intake 1053.16 ml  Output --  Net 1053.16 ml     Physical Exam  Awake but confused, No new F.N deficits, Normal affect [place].AT,PERRAL Supple Neck,No JVD, No cervical lymphadenopathy appriciated.  Symmetrical Chest wall  movement, Good air movement bilaterally, CTAB RRR,No Gallops, Rubs or new Murmurs, No Parasternal Heave +ve B.Sounds, Abd Soft, No tenderness, No organomegaly appriciated, No rebound - guarding or rigidity. No Cyanosis, Clubbing or edema, No new Rash or bruise     Data Review:    CBC Recent Labs  Lab 06/22/20 1143 06/23/20 0140 06/24/20 0650 06/25/20 0133  WBC 17.3* 6.2 11.0* 10.8*  HGB 13.2 10.3* 11.6* 10.3*  HCT 39.5 31.5* 35.3* 30.6*  PLT 283 231 264 277  MCV 102.6* 103.6* 102.0* 103.0*  MCH 34.3* 33.9 33.5 34.7*  MCHC 33.4 32.7 32.9 33.7  RDW 14.0 13.9 13.7 13.9  LYMPHSABS 1.0 0.5* 0.6* 0.4*  MONOABS 0.9 0.2 0.4 0.5  EOSABS 0.1 0.0 0.0 0.0   BASOSABS 0.1 0.0 0.0 0.0    Recent Labs  Lab 06/22/20 1143 06/22/20 1144 06/22/20 1405 06/22/20 2235 06/22/20 2236 06/23/20 0140 06/23/20 0626 06/24/20 0650 06/25/20 0133  NA 134*  --   --   --  135 135  --  137 138  K 3.2*  --   --   --  3.7 3.6  --  3.4* 3.3*  CL 95*  --   --   --  99 103  --  101 103  CO2 24  --   --   --  24 23  --  23 25  GLUCOSE 114*  --   --   --  147* 144*  --  146* 130*  BUN 23  --   --   --  22 22  --  22 19  CREATININE 1.34*  --   --   --  1.34* 1.22  --  1.07 1.05  CALCIUM 8.4*  --   --   --  8.0* 7.9*  --  8.5* 8.6*  AST 31  --   --   --   --  31  --  39 62*  ALT 20  --   --   --   --  21  --  27 41  ALKPHOS 55  --   --   --   --  48  --  49 47  BILITOT 1.5*  --   --   --   --  1.3*  --  0.8 0.6  ALBUMIN 2.5*  --   --   --   --  1.7*  --  2.1* 2.0*  MG  --   --   --   --  1.0* 1.0*  --  1.4* 1.3*  CRP  --  11.1*  --   --   --  9.0*  --  3.7* 1.7*  DDIMER 0.67*  --   --   --   --  0.39  --  0.51* 0.47  PROCALCITON 0.86  --   --   --   --   --  0.52 0.30 0.19  LATICACIDVEN 2.4*  --  1.7  --   --   --   --   --   --   AMMONIA  --   --   --  11  --   --   --   --   --   BNP  --   --   --   --   --  278.3*  --  366.6* 328.9*    ------------------------------------------------------------------------------------------------------------------ Recent Labs    06/22/20 1143  TRIG 165*    No results found for: HGBA1C ------------------------------------------------------------------------------------------------------------------ No  results for input(s): TSH, T4TOTAL, T3FREE, THYROIDAB in the last 72 hours.  Invalid input(s): FREET3  Cardiac Enzymes No results for input(s): CKMB, TROPONINI, MYOGLOBIN in the last 168 hours.  Invalid input(s): CK ------------------------------------------------------------------------------------------------------------------    Component Value Date/Time   BNP 328.9 (H) 06/25/2020 0133    Micro  Results Recent Results (from the past 240 hour(s))  Blood Culture (routine x 2)     Status: Abnormal   Collection Time: 06/22/20 11:41 AM   Specimen: Right Antecubital; Blood  Result Value Ref Range Status   Specimen Description   Final    RIGHT ANTECUBITAL Performed at [facility], [address] [place], [place] [address]    Special Requests   Final    BOTTLES DRAWN AEROBIC AND ANAEROBIC Blood Culture adequate volume Performed at [facility], [address] [place], [place] [address]    Culture  Setup Time   Final    IN BOTH AEROBIC AND ANAEROBIC BOTTLES GRAM POSITIVE COCCI Organism ID to follow CRITICAL RESULT CALLED TO, READ BACK BY AND VERIFIED WITH: Lana Fish PharmD 8:25 06/23/20 ([email or web address]) Performed at [facility], [address] [place], [place] [address]    Culture ENTEROCOCCUS FAECALIS (A)  Final   Report Status 06/25/2020 FINAL  Final   Organism ID, Bacteria ENTEROCOCCUS FAECALIS  Final      Susceptibility   Enterococcus faecalis - MIC*    AMPICILLIN <=2 SENSITIVE Sensitive     VANCOMYCIN 2 SENSITIVE Sensitive     GENTAMICIN SYNERGY SENSITIVE Sensitive     * ENTEROCOCCUS FAECALIS  Blood Culture ID Panel (Reflexed)     Status: Abnormal   Collection Time: 06/22/20 11:41 AM  Result Value Ref Range Status   Enterococcus faecalis DETECTED (A) NOT DETECTED Final    Comment: CRITICAL RESULT CALLED TO, READ BACK BY AND VERIFIED WITH: Lana Fish PharmD 8:25 06/23/20 ([email or web address])    Enterococcus Faecium NOT DETECTED NOT DETECTED Final   Listeria monocytogenes NOT DETECTED NOT DETECTED Final   Staphylococcus species NOT DETECTED NOT DETECTED Final   Staphylococcus aureus (BCID) NOT DETECTED NOT DETECTED Final   Staphylococcus epidermidis NOT DETECTED NOT DETECTED Final   Staphylococcus lugdunensis NOT DETECTED NOT DETECTED Final   Streptococcus species NOT DETECTED NOT DETECTED Final   Streptococcus agalactiae NOT DETECTED NOT DETECTED Final    Streptococcus pneumoniae NOT DETECTED NOT DETECTED Final   Streptococcus pyogenes NOT DETECTED NOT DETECTED Final   A.calcoaceticus-baumannii NOT DETECTED NOT DETECTED Final   Bacteroides fragilis NOT DETECTED NOT DETECTED Final   Enterobacterales NOT DETECTED NOT DETECTED Final   Enterobacter cloacae complex NOT DETECTED NOT DETECTED Final   Escherichia coli NOT DETECTED NOT DETECTED Final   Klebsiella aerogenes NOT DETECTED NOT DETECTED Final   Klebsiella oxytoca NOT DETECTED NOT DETECTED Final   Klebsiella pneumoniae NOT DETECTED NOT DETECTED Final   Proteus species NOT DETECTED NOT DETECTED Final   Salmonella species NOT DETECTED NOT DETECTED Final   Serratia marcescens NOT DETECTED NOT DETECTED Final   Haemophilus influenzae NOT DETECTED NOT DETECTED Final   Neisseria meningitidis NOT DETECTED NOT DETECTED Final   Pseudomonas aeruginosa NOT DETECTED NOT DETECTED Final   Stenotrophomonas maltophilia NOT DETECTED NOT DETECTED Final   Candida albicans NOT DETECTED NOT DETECTED Final   Candida auris NOT DETECTED NOT DETECTED Final   Candida glabrata NOT DETECTED NOT DETECTED Final   Candida krusei NOT DETECTED NOT DETECTED Final   Candida parapsilosis NOT DETECTED NOT  DETECTED Final   Candida tropicalis NOT DETECTED NOT DETECTED Final   Cryptococcus neoformans/gattii NOT DETECTED NOT DETECTED Final   Vancomycin resistance NOT DETECTED NOT DETECTED Final    Comment: Performed at [facility], [address] [place], [place] [address]  Respiratory Panel by RT PCR (Flu A&B, Covid) - Nasopharyngeal Swab     Status: Abnormal   Collection Time: 06/22/20 11:43 AM   Specimen: Nasopharyngeal Swab  Result Value Ref Range Status   SARS Coronavirus 2 by RT PCR POSITIVE (A) NEGATIVE Final    Comment: RESULT CALLED TO, READ BACK BY AND VERIFIED WITH:  COBLE SAM,RN @1323  ON 06/22/2020, [email or web address] (NOTE) SARS-CoV-2 target nucleic acids are DETECTED.  SARS-CoV-2 RNA is generally  detectable in upper respiratory specimens  during the acute phase of infection. Positive results are indicative of the presence of the identified virus, but do not rule out bacterial infection or co-infection with other pathogens not detected by the test. Clinical correlation with patient history and other diagnostic information is necessary to determine patient infection status. The expected result is Negative.  Fact Sheet for Patients:  https://www.moore.com/https://www.fda.gov/media/142436/download  Fact Sheet for Healthcare Providers: https://www.young.biz/https://www.fda.gov/media/142435/download  This test is not yet approved or cleared by the [place] FDA and  has been authorized for detection and/or diagnosis of SARS-CoV-2 by FDA under an Emergency Use Authorization (EUA).  This EUA will remain in effect (meaning this  test can be used) for the duration of  the COVID-19 declaration under Section 564(b)(1) of the Act, 21 U.S.C. section 360bbb-3(b)(1), unless the authorization is terminated or revoked sooner.      Influenza A by PCR NEGATIVE NEGATIVE Final   Influenza B by PCR NEGATIVE NEGATIVE Final    Comment: (NOTE) The Xpert Xpress SARS-CoV-2/FLU/RSV assay is intended as an aid in  the diagnosis of influenza from Nasopharyngeal swab specimens and  should not be used as a sole basis for treatment. Nasal washings and  aspirates are unacceptable for Xpert Xpress SARS-CoV-2/FLU/RSV  testing.  Fact Sheet for Patients: https://www.moore.com/https://www.fda.gov/media/142436/download  Fact Sheet for Healthcare Providers: https://www.young.biz/https://www.fda.gov/media/142435/download  This test is not yet approved or cleared by the [place] FDA and  has been authorized for detection and/or diagnosis of SARS-CoV-2 by  FDA under an Emergency Use Authorization (EUA). This EUA will remain  in effect (meaning this test can be used) for the duration of the  Covid-19 declaration under Section 564(b)(1) of the Act, 21  U.S.C. section 360bbb-3(b)(1),  unless the authorization is  terminated or revoked. Performed at [facility] [place], [address] [place], [place] [address]   Blood Culture (routine x 2)     Status: Abnormal   Collection Time: 06/22/20 11:58 AM   Specimen: Left Antecubital; Blood  Result Value Ref Range Status   Specimen Description   Final    LEFT ANTECUBITAL Performed at [facility], [address] [place], [place] [address]    Special Requests   Final    BOTTLES DRAWN AEROBIC AND ANAEROBIC Blood Culture adequate volume Performed at [facility], [address] [place], [place] [address]    Culture  Setup Time   Final    IN BOTH AEROBIC AND ANAEROBIC BOTTLES GRAM POSITIVE COCCI CRITICAL VALUE NOTED.  VALUE IS CONSISTENT WITH PREVIOUSLY REPORTED AND CALLED VALUE.    Culture (A)  Final    ENTEROCOCCUS FAECALIS SUSCEPTIBILITIES PERFORMED ON PREVIOUS CULTURE WITHIN THE LAST 5 DAYS. Performed at [facility], [address], [place], [place] [address]    Report Status 06/25/2020 FINAL  Final  MRSA PCR Screening     Status: None   Collection Time: 06/23/20  8:08 AM   Specimen: Nasal Mucosa; Nasopharyngeal  Result Value Ref Range Status   MRSA by PCR NEGATIVE NEGATIVE Final    Comment:        The GeneXpert MRSA Assay (FDA approved for NASAL specimens only), is one component of a comprehensive MRSA colonization surveillance program. It is not intended to diagnose MRSA infection nor to guide or monitor treatment for MRSA infections. Performed at [facility], [address] [place], [place] [address]   Urine culture     Status: None   Collection Time: 06/23/20 12:01 PM   Specimen: Urine, Clean Catch  Result Value Ref Range Status   Specimen Description   Final    URINE, CLEAN CATCH Performed at [facility], [address] [place], [place] [address]    Special Requests   Final    NONE Performed at [facility], [address]  [place], [place] [address]    Culture   Final    NO GROWTH Performed at [facility], [address] [place], [place] [address]    Report Status 06/24/2020 FINAL  Final  Culture, blood (routine x 2)     Status: None (Preliminary result)   Collection Time: 06/24/20  6:50 AM   Specimen: BLOOD LEFT HAND  Result Value Ref Range Status   Specimen Description BLOOD LEFT HAND  Final   Special Requests   Final    BOTTLES DRAWN AEROBIC AND ANAEROBIC Blood Culture adequate volume   Culture   Final    NO GROWTH < 12 HOURS Performed at [facility], [address] [place], [place] [address]    Report Status PENDING  Incomplete  Culture, blood (routine x 2)     Status: None (Preliminary result)   Collection Time: 06/24/20  6:50 AM   Specimen: BLOOD RIGHT HAND  Result Value Ref Range Status   Specimen Description BLOOD RIGHT HAND  Final   Special Requests   Final    BOTTLES DRAWN AEROBIC AND ANAEROBIC Blood Culture adequate volume   Culture   Final    NO GROWTH < 12 HOURS Performed at [facility], [address] [place] [address], [place], [place] [address]    Report Status PENDING  Incomplete    Radiology Reports CT HEAD WO CONTRAST  Result Date: 06/22/2020 CLINICAL DATA:  Delirium. EXAM: CT HEAD WITHOUT CONTRAST TECHNIQUE: Contiguous axial images were obtained from the base of the skull through the vertex without intravenous contrast. COMPARISON:  Head CT 05/14/2016 FINDINGS: Brain: No intracranial hemorrhage, mass effect, or midline shift. Age related atrophy. No hydrocephalus. The basilar cisterns are patent. Mild to moderate chronic small vessel ischemia with slight progression from 2017. No evidence of territorial infarct or acute ischemia. No extra-axial or intracranial fluid collection. Vascular: Atherosclerosis of skullbase vasculature without hyperdense vessel or abnormal calcification. Skull: No fracture or focal lesion. Sinuses/Orbits: Opacification of the lower right greater than  left mastoid air cells, progressed from 2017. Small fluid levels in the left and right side of sphenoid sinus. No acute orbital abnormality. Bilateral cataract resection. Other: None. IMPRESSION: 1. No acute intracranial abnormality. 2. Age related atrophy and chronic small vessel ischemia. 3. Opacification of the lower right greater than left mastoid air  cells likely mastoid effusion, progressed from 2017. Small fluid levels in the left and right side of the sphenoid sinus. Electronically Signed   By: Narda Rutherford M.D.   On: 06/22/2020 22:36   DG Chest Port 1 View  Result Date: 06/23/2020 CLINICAL DATA:  Shortness of breath and COVID. EXAM: PORTABLE CHEST 1 VIEW COMPARISON:  06/22/2020 FINDINGS: 0846 hours. Low lung volumes. Progressive patchy ground-glass and consolidative airspace opacity in the lower lungs. The cardio pericardial silhouette is enlarged. No substantial pleural effusion. The visualized bony structures of the thorax show no acute abnormality. Telemetry leads overlie the chest. IMPRESSION: Progressive bibasilar airspace disease consistent with multifocal pneumonia. Electronically Signed   By: Kennith Center M.D.   On: 06/23/2020 09:04   DG Chest Port 1 View  Result Date: 06/22/2020 CLINICAL DATA:  COVID, altered mental status. EXAM: PORTABLE CHEST 1 VIEW COMPARISON:  10/19/2014. FINDINGS: Low lung volumes. Subtle airspace opacities in the peripheral lung bases. No visible pleural effusions or pneumothorax. Cardiomediastinal silhouette is within normal limits. No evidence of acute osseous abnormality. IMPRESSION: Subtle airspace opacities in the peripheral lung bases, concerning for multifocal pneumonia and consistent with reported COVID diagnosis. Electronically Signed   By: Feliberto Harts MD   On: 06/22/2020 11:39   DG Swallowing Func-Speech Pathology  Result Date: 06/23/2020 Objective Swallowing Evaluation: Type of Study: MBS-Modified Barium Swallow Study  Patient Details  Name: WALTON DIGILIO MRN: [id number] Date of Birth: 06-13-1941 Today's Date: 06/23/2020 Time: SLP Start Time (ACUTE ONLY): 1417 -SLP Stop Time (ACUTE ONLY): 1437 SLP Time Calculation (min) (ACUTE ONLY): 20 min Past Medical History: Past Medical History: Diagnosis Date . Arthritis  . Atrial fibrillation (HCC) 01/31/2015 . Chronic gout  . Coronary artery disease 12/2007  STATUS POST STENTING OF THE LEFT ANTERIOR DESCENDING ARTERY . COVID-19  . Dyslipidemia  . Gout  . Hiatal hernia  . Hyperlipidemia  . Hypertension  . LBP (low back pain)  . Partial small bowel obstruction (HCC)  . RBBB (right bundle branch block)  . SBO (small bowel obstruction) (HCC)   PARTIAL . Second degree AV block, Mobitz type I  . Urinary tract infection   Escherichia coli urinary tract infection Past Surgical History: Past Surgical History: Procedure Laterality Date . CARDIAC CATHETERIZATION   . HERNIA REPAIR    INGUINAL . ROTATOR CUFF REPAIR   HPI: 79 yo male presenting to ED with generalized weakness, poor food intake, and confusion. COVID positive. PMH including hiatal hernia, arthritis, A-fib, HTN, AKI, alcohol abuse, and CAD with s/p stenting of L anterior descending artery. CXR Progressive bibasilar airspace disease consistent with multifocal pneumonia.  No data recorded Assessment / Plan / Recommendation CHL IP CLINICAL IMPRESSIONS 06/23/2020 Clinical Impression Pt exhibits mild oropharyngeal dysphagia with aspiration of nectar consistency, and mild vallecular residual. 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 Pt unable to contain entire bolus in oral cavity and moderate amount nectar thick fell over tongue and was aspirated prior to initiation of laryngeal closure (frank volume) with immediate cough. Timing of closure delayed with thin using chin tuck and slight aspiraiton present. Lingual residue present falling into valleculae post swallow. Chin down  posture using straw was effective with nectar however pt displays memory deficit and follow through of strategy is questionable. 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 Recommend honey thick liquids, Dys 3, crush meds. full supervision/assist. SLP will work with pt on  chin tuck with nectar for possible upgrade.  SLP Visit Diagnosis Dysphagia, oropharyngeal phase (R13.12) Attention and concentration deficit following -- Frontal lobe and executive function deficit following -- Impact on safety and function Mild aspiration risk;Moderate aspiration risk   CHL IP TREATMENT RECOMMENDATION 06/23/2020 Treatment Recommendations Therapy as outlined in treatment plan below   Prognosis 06/23/2020 Prognosis for Safe Diet Advancement (No Data) Barriers to Reach Goals Cognitive deficits Barriers/Prognosis Comment -- CHL IP DIET RECOMMENDATION 06/23/2020 SLP Diet Recommendations Dysphagia 3 (Mech soft) solids;Honey thick liquids Liquid Administration via Cup Medication Administration Crushed with puree Compensations Minimize environmental distractions;Slow rate;Small sips/bites Postural Changes Seated upright at 90 degrees;Remain semi-upright after after feeds/meals (Comment)   CHL IP OTHER RECOMMENDATIONS 06/23/2020 Recommended Consults -- Oral Care Recommendations Oral care BID Other Recommendations --   CHL IP FOLLOW UP RECOMMENDATIONS 06/23/2020 Follow up Recommendations (No Data)   CHL IP FREQUENCY AND DURATION 06/23/2020 Speech Therapy Frequency (ACUTE ONLY) min 2x/week Treatment Duration 2 weeks      CHL IP ORAL PHASE 06/23/2020 Oral Phase Impaired Oral - Pudding Teaspoon -- Oral - Pudding Cup -- Oral - Honey Teaspoon -- Oral - Honey Cup Lingual/palatal residue;Piecemeal swallowing Oral - Nectar Teaspoon -- Oral - Nectar Cup Decreased bolus cohesion Oral - Nectar Straw Decreased bolus cohesion Oral - Thin Teaspoon -- Oral - Thin Cup -- Oral - Thin Straw -- Oral - Puree Piecemeal  swallowing Oral - Mech Soft -- Oral - Regular -- Oral - Multi-Consistency -- Oral - Pill -- Oral Phase - Comment --  CHL IP PHARYNGEAL PHASE 06/23/2020 Pharyngeal Phase Impaired Pharyngeal- Pudding Teaspoon -- Pharyngeal -- Pharyngeal- Pudding Cup -- Pharyngeal -- Pharyngeal- Honey Teaspoon -- Pharyngeal -- Pharyngeal- Honey Cup Pharyngeal residue - valleculae Pharyngeal -- Pharyngeal- Nectar Teaspoon -- Pharyngeal -- Pharyngeal- Nectar Cup Penetration/Aspiration before swallow Pharyngeal Material enters airway, passes BELOW cords and not ejected out despite cough attempt by patient Pharyngeal- Nectar Straw Penetration/Aspiration during swallow Pharyngeal Material enters airway, remains ABOVE vocal cords then ejected out;Material does not enter airway Pharyngeal- Thin Teaspoon NT Pharyngeal -- Pharyngeal- Thin Cup NT Pharyngeal -- Pharyngeal- Thin Straw Penetration/Aspiration during swallow Pharyngeal Material enters airway, passes BELOW cords without attempt by patient to eject out (silent aspiration) Pharyngeal- Puree Pharyngeal residue - valleculae Pharyngeal -- Pharyngeal- Mechanical Soft -- Pharyngeal -- Pharyngeal- Regular WFL Pharyngeal -- Pharyngeal- Multi-consistency -- Pharyngeal -- Pharyngeal- Pill -- Pharyngeal -- Pharyngeal Comment --  CHL IP CERVICAL ESOPHAGEAL PHASE 06/23/2020 Cervical Esophageal Phase WFL Pudding Teaspoon -- Pudding Cup -- Honey Teaspoon -- Honey Cup -- Nectar Teaspoon -- Nectar Cup -- Nectar Straw -- Thin Teaspoon -- Thin Cup -- Thin Straw -- Puree -- Mechanical Soft -- Regular -- Multi-consistency -- Pill -- Cervical Esophageal Comment -- Royce Macadamia 06/23/2020, 4:04 PM Breck Coons [employer] Pager [phone] Office [phone]              ECHOCARDIOGRAM COMPLETE  Result Date: 06/23/2020    ECHOCARDIOGRAM REPORT   Patient Name:   CHAO BLAZEJEWSKI Date of Exam: 06/23/2020 Medical Rec #:  [id number]      Height:       71.0 in Accession #:     [id number]     Weight:       244.7 lb Date of Birth:  04-Apr-1941      BSA:          2.297 m Patient Age:    79 years  BP:           170/76 mmHg Patient Gender: M              HR:           71 bpm. Exam Location:  Inpatient Procedure: 2D Echo, Cardiac Doppler and Color Doppler Indications:    R07.9* Chest pain, unspecified. Elevated troponin  History:        Patient has prior history of Echocardiogram examinations, most                 recent 02/24/2019. CAD, Abnormal ECG, Arrythmias:RBBB,                 Signs/Symptoms:Hypotension; Risk Factors:Dyslipidemia. Covid 19                 positive. ETOH.  Sonographer:    Sheralyn Boatman RDCS Referring Phys: [id number] ANASTASSIA DOUTOVA  Sonographer Comments: Patient in Fowlaer's position. IMPRESSIONS  1. Left ventricular ejection fraction, by estimation, is 60 to 65%. The left ventricle has normal function. The left ventricle has no regional wall motion abnormalities. There is mild left ventricular hypertrophy. Left ventricular diastolic parameters are consistent with Grade I diastolic dysfunction (impaired relaxation).  2. Right ventricular systolic function is moderately reduced. The right ventricular size is normal. There is normal pulmonary artery systolic pressure. The estimated right ventricular systolic pressure is 33.2 mmHg. Abnormal RV, would consider PE.  3. Right atrial size was mildly dilated.  4. The mitral valve is normal in structure. No evidence of mitral valve regurgitation. No evidence of mitral stenosis.  5. The aortic valve is tricuspid. Aortic valve regurgitation is trivial. Mild aortic valve sclerosis is present, with no evidence of aortic valve stenosis.  6. The inferior vena cava is normal in size with greater than 50% respiratory variability, suggesting right atrial pressure of 3 mmHg. FINDINGS  Left Ventricle: Left ventricular ejection fraction, by estimation, is 60 to 65%. The left ventricle has normal function. The left ventricle has no regional  wall motion abnormalities. The left ventricular internal cavity size was normal in size. There is  mild left ventricular hypertrophy. Left ventricular diastolic parameters are consistent with Grade I diastolic dysfunction (impaired relaxation). Right Ventricle: The right ventricular size is normal. No increase in right ventricular wall thickness. Right ventricular systolic function is moderately reduced. There is normal pulmonary artery systolic pressure. The tricuspid regurgitant velocity is 2.75 m/s, and with an assumed right atrial pressure of 3 mmHg, the estimated right ventricular systolic pressure is 33.2 mmHg. Left Atrium: Left atrial size was normal in size. Right Atrium: Right atrial size was mildly dilated. Pericardium: There is no evidence of pericardial effusion. Mitral Valve: The mitral valve is normal in structure. No evidence of mitral valve regurgitation. No evidence of mitral valve stenosis. Tricuspid Valve: The tricuspid valve is normal in structure. Tricuspid valve regurgitation is trivial. Aortic Valve: The aortic valve is tricuspid. Aortic valve regurgitation is trivial. Mild aortic valve sclerosis is present, with no evidence of aortic valve stenosis. Pulmonic Valve: The pulmonic valve was normal in structure. Pulmonic valve regurgitation is not visualized. Aorta: The aortic root is normal in size and structure. Venous: The inferior vena cava is normal in size with greater than 50% respiratory variability, suggesting right atrial pressure of 3 mmHg. IAS/Shunts: No atrial level shunt detected by color flow Doppler.  LEFT VENTRICLE PLAX 2D LVIDd:         3.80 cm     Diastology LVIDs:  2.60 cm     LV e' medial:    4.35 cm/s LV PW:         1.70 cm     LV E/e' medial:  16.9 LV IVS:        1.50 cm     LV e' lateral:   5.33 cm/s LVOT diam:     2.30 cm     LV E/e' lateral: 13.8 LV SV:         97 LV SV Index:   42 LVOT Area:     4.15 cm  LV Volumes (MOD) LV vol d, MOD A2C: 78.7 ml LV vol d, MOD  A4C: 84.2 ml LV vol s, MOD A2C: 17.5 ml LV vol s, MOD A4C: 25.2 ml LV SV MOD A2C:     61.2 ml LV SV MOD A4C:     84.2 ml LV SV MOD BP:      64.3 ml RIGHT VENTRICLE             IVC RV S prime:     18.50 cm/s  IVC diam: 2.20 cm TAPSE (M-mode): 3.2 cm LEFT ATRIUM             Index       RIGHT ATRIUM           Index LA diam:        3.70 cm 1.61 cm/m  RA Area:     18.30 cm LA Vol (A2C):   34.1 ml 14.84 ml/m RA Volume:   53.10 ml  23.11 ml/m LA Vol (A4C):   49.7 ml 21.63 ml/m LA Biplane Vol: 43.7 ml 19.02 ml/m  AORTIC VALVE LVOT Vmax:   124.00 cm/s LVOT Vmean:  89.800 cm/s LVOT VTI:    0.233 m  AORTA Ao Root diam: 3.50 cm MITRAL VALVE               TRICUSPID VALVE MV Area (PHT): 3.21 cm    TR Peak grad:   30.2 mmHg MV Decel Time: 236 msec    TR Vmax:        275.00 cm/s MV E velocity: 73.30 cm/s MV A velocity: 99.05 cm/s  SHUNTS MV E/A ratio:  0.74        Systemic VTI:  0.23 m                            Systemic Diam: 2.30 cm Marca Ancona MD Electronically signed by Marca Ancona MD Signature Date/Time: 06/23/2020/4:53:51 PM    Final

## 2020-06-26 DIAGNOSIS — E872 Acidosis: Secondary | ICD-10-CM | POA: Diagnosis not present

## 2020-06-26 DIAGNOSIS — E876 Hypokalemia: Secondary | ICD-10-CM | POA: Diagnosis not present

## 2020-06-26 DIAGNOSIS — G9340 Encephalopathy, unspecified: Secondary | ICD-10-CM | POA: Diagnosis not present

## 2020-06-26 DIAGNOSIS — U071 COVID-19: Secondary | ICD-10-CM | POA: Diagnosis not present

## 2020-06-26 LAB — PROCALCITONIN: Procalcitonin: <0.1 ng/mL

## 2020-06-26 LAB — CBC WITH DIFFERENTIAL/PLATELET
Abs Immature Granulocytes: 0.18 10*3/uL — ABNORMAL HIGH (ref 0.00–0.07)
Basophils Absolute: 0 10*3/uL (ref 0.0–0.1)
Basophils Relative: 0 %
Eosinophils Absolute: 0 10*3/uL (ref 0.0–0.5)
Eosinophils Relative: 0 %
HCT: 29.3 % — ABNORMAL LOW (ref 39.0–52.0)
Hemoglobin: 9.5 g/dL — ABNORMAL LOW (ref 13.0–17.0)
Immature Granulocytes: 2 %
Lymphocytes Relative: 4 %
Lymphs Abs: 0.4 10*3/uL — ABNORMAL LOW (ref 0.7–4.0)
MCH: 33.6 pg (ref 26.0–34.0)
MCHC: 32.4 g/dL (ref 30.0–36.0)
MCV: 103.5 fL — ABNORMAL HIGH (ref 80.0–100.0)
Monocytes Absolute: 0.5 10*3/uL (ref 0.1–1.0)
Monocytes Relative: 5 %
Neutro Abs: 8.8 10*3/uL — ABNORMAL HIGH (ref 1.7–7.7)
Neutrophils Relative %: 89 %
Platelets: 240 10*3/uL (ref 150–400)
RBC: 2.83 MIL/uL — ABNORMAL LOW (ref 4.22–5.81)
RDW: 14.1 % (ref 11.5–15.5)
WBC: 9.9 10*3/uL (ref 4.0–10.5)
nRBC: 0 % (ref 0.0–0.2)

## 2020-06-26 LAB — COMPREHENSIVE METABOLIC PANEL
ALT: 67 U/L — ABNORMAL HIGH (ref 0–44)
AST: 77 U/L — ABNORMAL HIGH (ref 15–41)
Albumin: 2 g/dL — ABNORMAL LOW (ref 3.5–5.0)
Alkaline Phosphatase: 46 U/L (ref 38–126)
Anion gap: 8 (ref 5–15)
BUN: 17 mg/dL (ref 8–23)
CO2: 26 mmol/L (ref 22–32)
Calcium: 8.8 mg/dL — ABNORMAL LOW (ref 8.9–10.3)
Chloride: 106 mmol/L (ref 98–111)
Creatinine, Ser: 0.93 mg/dL (ref 0.61–1.24)
GFR, Estimated: >60 mL/min (ref >60)
Glucose, Bld: 132 mg/dL — ABNORMAL HIGH (ref 70–99)
Potassium: 3.8 mmol/L (ref 3.5–5.1)
Sodium: 140 mmol/L (ref 135–145)
Total Bilirubin: 0.5 mg/dL (ref 0.3–1.2)
Total Protein: 4.9 g/dL — ABNORMAL LOW (ref 6.5–8.1)

## 2020-06-26 LAB — PHOSPHORUS: Phosphorus: 3.1 mg/dL (ref 2.5–4.6)

## 2020-06-26 LAB — D-DIMER, QUANTITATIVE: D-Dimer, Quant: 0.59 ug/mL-FEU — ABNORMAL HIGH (ref 0.00–0.50)

## 2020-06-26 LAB — BRAIN NATRIURETIC PEPTIDE: B Natriuretic Peptide: 489.1 pg/mL — ABNORMAL HIGH (ref 0.0–100.0)

## 2020-06-26 LAB — MAGNESIUM: Magnesium: 1.5 mg/dL — ABNORMAL LOW (ref 1.7–2.4)

## 2020-06-26 LAB — C-REACTIVE PROTEIN: CRP: 0.9 mg/dL (ref <1.0)

## 2020-06-26 MED ORDER — NITROGLYCERIN 2 % TD OINT
0.5000 [in_us] | TOPICAL_OINTMENT | Freq: Four times a day (QID) | TRANSDERMAL | Status: DC
  Administered 2020-06-26 – 2020-06-29 (×10): 0.5 [in_us] via TOPICAL
  Filled 2020-06-26: qty 30

## 2020-06-26 MED ORDER — ALPRAZOLAM 0.25 MG PO TABS: 0.2500 mg | ORAL_TABLET | Freq: Every evening | ORAL | Status: DC | PRN

## 2020-06-26 MED ORDER — LACTATED RINGERS IV SOLN: INTRAVENOUS | Status: DC

## 2020-06-26 MED ORDER — MAGNESIUM SULFATE 2 GM/50ML IV SOLN
2.0000 g | Freq: Once | INTRAVENOUS | Status: AC
  Administered 2020-06-26: 2 g via INTRAVENOUS
  Filled 2020-06-26: qty 50

## 2020-06-26 MED ORDER — METOPROLOL TARTRATE 25 MG PO TABS
25.0000 mg | ORAL_TABLET | Freq: Two times a day (BID) | ORAL | Status: DC
  Administered 2020-06-26: 25 mg via ORAL
  Filled 2020-06-26 (×2): qty 1

## 2020-06-26 NOTE — Progress Notes (Addendum)
PROGRESS NOTE                                                                                                                                                                                                             Patient Demographics:    Gregory Wheeler, is a 79 y.o. male, DOB - 06/12/1941, DPO:[id number]  Outpatient Primary MD for the patient is Jacqualine Mau, NP    LOS - 4  Admit date - 06/22/2020    Chief Complaint  Patient presents with  . Covid pos  and Not eating or drinking       Brief Narrative (HPI from H&P)  - Gregory Wheeler is a 79 y.o. male with medical history significant of Spondylosis, gout, GERD, HTn, p A.fib, iron defficiency anemia, heavy alcohol abuse, CAD, HLD, right bundle branch block, history of small bowel obstruction, history of second-degree AV block Mobitz type I who has been sick for around 2 to 3 weeks before seeking help presented to the ER with shortness of breath and weakness.  Was diagnosed with COVID-19 pneumonia along with possible aspiration pneumonia, also had evidence of severe dehydration and AKI and was admitted to the hospital.   Subjective:   Patient in bed, extremely confused this morning, unable to answer questions reliably, in no distress   Assessment  & Plan :     1. Acute Hypoxic Resp. Failure due to Acute Covid 19 Viral Pneumonitis during the ongoing 2020 Covid 19 Pandemic -there is also evidence of possible overlying aspiration pneumonia causing bacterial infection.  He is vaccinated and likely has breakthrough infection, he has been started on steroids and Remdesivir along with antibiotics, speech therapy following aspiration precautions.  Overall quite tenuous due to his advanced age and multiple other medical issues.  Encouraged the patient to sit up in chair in the daytime use I-S and flutter valve for pulmonary toiletry and then prone in bed when at night.  Will  advance activity and titrate down oxygen as possible.    SpO2: 91 % O2 Flow Rate (L/min): (S) 1 L/min  Recent Labs  Lab 06/22/20 1143 06/22/20 1144 06/22/20 1405 06/23/20 0140 06/23/20 0626 06/24/20 0650 06/25/20 0133 06/26/20 0320  WBC 17.3*  --   --  6.2  --  11.0* 10.8* 9.9  HGB 13.2  --   --  10.3*  --  11.6* 10.3* 9.5*  HCT 39.5  --   --  31.5*  --  35.3* 30.6* 29.3*  PLT 283  --   --  231  --  264 277 240  CRP  --  11.1*  --  9.0*  --  3.7* 1.7* 0.9  BNP  --   --   --  278.3*  --  366.6* 328.9* 489.1*  DDIMER 0.67*  --   --  0.39  --  0.51* 0.47 0.59*  PROCALCITON 0.86  --   --   --  0.52 0.30 0.19 <0.10  AST 31  --   --  31  --  39 62* 77*  ALT 20  --   --  21  --  27 41 67*  ALKPHOS 55  --   --  48  --  49 47 46  BILITOT 1.5*  --   --  1.3*  --  0.8 0.6 0.5  ALBUMIN 2.5*  --   --  1.7*  --  2.1* 2.0* 2.0*  LATICACIDVEN 2.4*  --  1.7  --   --   --   --   --   SARSCOV2NAA POSITIVE*  --   --   --   --   --   --   --      Aspiration pneumonia-coughing with eating.  Along with metabolic encephalopathy, high risk for aspiration, dysphagia 3 diet speech to evaluate. Unasyn.  Enterococcus bacteremia.  ID following.  No clear source, poor candidate for TEE.  Per ID prolonged IV antibiotics for now.   Currently on Rocephin Unasyn finally should be on Rocephin/ampicillin, will get PICC line.  Severe metabolic encephalopathy.  Supportive care, minimize benzodiazepines and narcotics, now in full-blown DTs.  Becoming extremely sedated, now at risk for aspiration, unable to take oral medications or cooperate in treatment and therapy, hitting at staff members, has to be placed in restraints, extremely poor prognosis chances of aspiration very high.  Debility will have PT OT evaluate prior to discharge other plan as per orders.  Family wants to take him home with home health PT and RN.   Alcohol abuse - Per family drank large amounts of alcohol until around 2 weeks ago, in the last  2 weeks according to them he has had not had any alcohol except half a bottle of beer on Saturday. Has gone into full-blown DTs on 06/26/2020, on CIWA protocol.  RBBB (right bundle branch block) - chronic stable   Hypertension - continue beta-blocker.   Coronary artery disease -chronic stable continue beta-blocker patient does not seem to be on a statin.   Chronic atrial fibrillation (HCC) - continue Xarelto and beta-blocker .   AKI (acute kidney injury) (HCC) - hydrate with IV fluids and Foley catheter for retention.  AKI improved.  Urinary retention - Flomax and Foley on 06/23/2020.  Unable to void.  Hypokalemia and hypomagnesemia with severe dehydration-  adding to the family members he was sick for at least 2 to 3 days before he came to the hospital, severely dehydrated, IV fluids and replace electrolytes.  Hypoalbuminemia - protein supplementation added.       Condition - Extremely Guarded  Family Communication  :   Argentina Ponder - [phone] - 06/23/20 clearly explained life-threatening condition with extremely grave prognosis, updated 06/24/20, 06/25/20  Daughter [phone] on June 23, 2020 clearly explained life-threatening condition with extremely grave prognosis.   Code Status :  Full  Consults  :  None  Procedures  :    TTE -   1. Left ventricular ejection fraction, by estimation, is 60 to 65%. The left ventricle has normal function. The left ventricle has no regional wall motion abnormalities. There is mild left ventricular hypertrophy. Left ventricular diastolic parameters are consistent with Grade I diastolic dysfunction (impaired relaxation).  2. Right ventricular systolic function is moderately reduced. The right ventricular size is normal. There is normal pulmonary artery systolic pressure. The estimated right ventricular systolic pressure is 33.2 mmHg. Abnormal RV, would consider PE.  3. Right atrial size was mildly dilated.  4. The mitral valve is normal  in structure. No evidence of mitral valve regurgitation. No evidence of mitral stenosis.  5. The aortic valve is tricuspid. Aortic valve regurgitation is trivial. Mild aortic valve sclerosis is present, with no evidence of aortic valve stenosis.  6. The inferior vena cava is normal in size with greater than 50% respiratory variability, suggesting right atrial pressure of 3 mmHg.  CT -  1. No acute intracranial abnormality. 2. Age related atrophy and chronic small vessel ischemia. 3. Opacification of the lower right greater than left mastoid air cells likely mastoid effusion, progressed from 2017. Small fluid levels in the left and right side of the sphenoid sinus.  PUD Prophylaxis :   Disposition Plan  :    Status is: Inpatient  Not inpatient appropriate, will call UM team and downgrade to OBS.   Dispo: The patient is from: Home              Anticipated d/c is to: Home              Anticipated d/c date is: > 3 days              Patient currently is not medically stable to d/c.   DVT Prophylaxis  :  Xaralto  Lab Results  Component Value Date   PLT 240 06/26/2020    Diet :  Diet Order            DIET DYS 3 Room service appropriate? No; Fluid consistency: Honey Thick  Diet effective now                  Inpatient Medications  Scheduled Meds: . allopurinol  300 mg Oral QHS  . amLODipine  10 mg Oral Daily  . vitamin C  500 mg Oral Daily  . Chlorhexidine Gluconate Cloth  6 each Topical Daily  . dexamethasone (DECADRON) injection  6 mg Intravenous Q24H  . folic acid  1 mg Oral Daily  . folic acid  1 mg Oral Daily  . lactose free nutrition  237 mL Oral TID WC  . metoprolol tartrate  25 mg Oral BID  . multivitamin with minerals  1 tablet Oral Daily  . nitroGLYCERIN  0.5 inch Topical Q6H  . rivaroxaban  20 mg Oral Q supper  . sodium chloride flush  3 mL Intravenous Q12H  . tamsulosin  0.4 mg Oral Daily  . thiamine  100 mg Intravenous Daily  . zinc sulfate  220 mg Oral Daily    Continuous Infusions: . ampicillin-sulbactam (UNASYN) IV 3 g (06/26/20 0923)  . cefTRIAXone (ROCEPHIN)  IV 2 g (06/26/20 0926)  . lactated ringers    . magnesium sulfate bolus IVPB 2 g (06/26/20 0927)  . remdesivir 100 mg in NS 100 mL 100 mg (06/26/20 0944)   PRN Meds:.acetaminophen, famotidine, guaiFENesin-dextromethorphan, haloperidol lactate, hydrALAZINE, LORazepam **  OR** LORazepam, [DISCONTINUED] ondansetron **OR** ondansetron (ZOFRAN) IV, Resource ThickenUp Clear  Antibiotics  :    Anti-infectives (From admission, onward)   Start     Dose/Rate Route Frequency Ordered Stop   06/25/20 1947  Ampicillin-Sulbactam (UNASYN) 3 g in sodium chloride 0.9 % 100 mL IVPB        3 g 200 mL/hr over 30 Minutes Intravenous Every 6 hours 06/25/20 1419     06/24/20 2200  cefTRIAXone (ROCEPHIN) 2 g in sodium chloride 0.9 % 100 mL IVPB        2 g 200 mL/hr over 30 Minutes Intravenous Every 12 hours 06/24/20 1513     06/24/20 1230  cefTRIAXone (ROCEPHIN) 2 g in sodium chloride 0.9 % 100 mL IVPB  Status:  Discontinued        2 g 200 mL/hr over 30 Minutes Intravenous Every 24 hours 06/24/20 1139 06/24/20 1513   06/23/20 1430  azithromycin (ZITHROMAX) 500 mg in sodium chloride 0.9 % 250 mL IVPB  Status:  Discontinued        500 mg 250 mL/hr over 60 Minutes Intravenous Every 24 hours 06/22/20 1406 06/22/20 1410   06/23/20 1300  Ampicillin-Sulbactam (UNASYN) 3 g in sodium chloride 0.9 % 100 mL IVPB  Status:  Discontinued        3 g 200 mL/hr over 30 Minutes Intravenous Every 8 hours 06/23/20 1131 06/25/20 1419   06/23/20 1000  remdesivir 100 mg in sodium chloride 0.9 % 100 mL IVPB        100 mg 200 mL/hr over 30 Minutes Intravenous Daily 06/22/20 1345 06/27/20 0959   06/23/20 0945  ampicillin (OMNIPEN) 2 g in sodium chloride 0.9 % 100 mL IVPB  Status:  Discontinued        2 g 300 mL/hr over 20 Minutes Intravenous Every 4 hours 06/23/20 0852 06/23/20 1131   06/22/20 1430  azithromycin (ZITHROMAX) 500  mg in sodium chloride 0.9 % 250 mL IVPB  Status:  Discontinued        500 mg 250 mL/hr over 60 Minutes Intravenous Every 24 hours 06/22/20 1410 06/23/20 0902   06/22/20 1400  remdesivir 100 mg in sodium chloride 0.9 % 100 mL IVPB        100 mg 200 mL/hr over 30 Minutes Intravenous Every 30 min 06/22/20 1345 06/22/20 1518   06/22/20 1400  cefTRIAXone (ROCEPHIN) 2 g in sodium chloride 0.9 % 100 mL IVPB  Status:  Discontinued        2 g 200 mL/hr over 30 Minutes Intravenous Every 24 hours 06/22/20 1357 06/23/20 0902   06/22/20 1400  azithromycin (ZITHROMAX) 500 mg in sodium chloride 0.9 % 250 mL IVPB  Status:  Discontinued        500 mg 250 mL/hr over 60 Minutes Intravenous Every 24 hours 06/22/20 1357 06/22/20 1406       Time Spent in minutes  30   Susa Raring M.D on 06/26/2020 at 10:03 AM  To page go to [email or web address] - password [facility]  [facility] -  Office  [phone]   See all Orders from today for further details    Objective:   Vitals:   06/25/20 1513 06/25/20 2059 06/26/20 0025 06/26/20 0455  BP: (!) 183/89 (!) 141/98 (!) 173/97 (!) 155/78  Pulse: 96 100 82 90  Resp: (!) 24 20 (!) 21 15  Temp: 97.9 F (36.6 C) 97.6 F (36.4 C)  (!) 97.4 F (36.3 C)  TempSrc: Axillary Oral  Axillary  SpO2: 96% 96% 97% 91%  Weight:      Height:        Wt Readings from Last 3 Encounters:  06/23/20 111 kg  03/14/20 110.3 kg  09/17/19 106.6 kg     Intake/Output Summary (Last 24 hours) at 06/26/2020 1003 Last data filed at 06/26/2020 0900 Gross per 24 hour  Intake 1367 ml  Output 1325 ml  Net 42 ml     Physical Exam  Awake but severely confused, unable to follow commands, moving all four extremities .AT,PERRAL Supple Neck,No JVD, No cervical lymphadenopathy appriciated.  Symmetrical Chest wall movement, Good air movement bilaterally, CTAB RRR,No Gallops, Rubs or new Murmurs, No Parasternal Heave +ve B.Sounds, Abd Soft, No tenderness, No organomegaly  appriciated, No rebound - guarding or rigidity. No Cyanosis, Clubbing or edema, No new Rash or bruise      Data Review:    CBC Recent Labs  Lab 06/22/20 1143 06/23/20 0140 06/24/20 0650 06/25/20 0133 06/26/20 0320  WBC 17.3* 6.2 11.0* 10.8* 9.9  HGB 13.2 10.3* 11.6* 10.3* 9.5*  HCT 39.5 31.5* 35.3* 30.6* 29.3*  PLT 283 231 264 277 240  MCV 102.6* 103.6* 102.0* 103.0* 103.5*  MCH 34.3* 33.9 33.5 34.7* 33.6  MCHC 33.4 32.7 32.9 33.7 32.4  RDW 14.0 13.9 13.7 13.9 14.1  LYMPHSABS 1.0 0.5* 0.6* 0.4* 0.4*  MONOABS 0.9 0.2 0.4 0.5 0.5  EOSABS 0.1 0.0 0.0 0.0 0.0  BASOSABS 0.1 0.0 0.0 0.0 0.0    Recent Labs  Lab 06/22/20 1143 06/22/20 1143 06/22/20 1144 06/22/20 1405 06/22/20 2235 06/22/20 2236 06/23/20 0140 06/23/20 0626 06/24/20 0650 06/25/20 0133 06/26/20 0320  NA 134*   < >  --   --   --  135 135  --  137 138 140  K 3.2*   < >  --   --   --  3.7 3.6  --  3.4* 3.3* 3.8  CL 95*   < >  --   --   --  99 103  --  101 103 106  CO2 24   < >  --   --   --  24 23  --  23 25 26   GLUCOSE 114*   < >  --   --   --  147* 144*  --  146* 130* 132*  BUN 23   < >  --   --   --  22 22  --  22 19 17   CREATININE 1.34*   < >  --   --   --  1.34* 1.22  --  1.07 1.05 0.93  CALCIUM 8.4*   < >  --   --   --  8.0* 7.9*  --  8.5* 8.6* 8.8*  AST 31  --   --   --   --   --  31  --  39 62* 77*  ALT 20  --   --   --   --   --  21  --  27 41 67*  ALKPHOS 55  --   --   --   --   --  48  --  49 47 46  BILITOT 1.5*  --   --   --   --   --  1.3*  --  0.8 0.6 0.5  ALBUMIN 2.5*  --   --   --   --   --  1.7*  --  2.1* 2.0* 2.0*  MG  --   --   --   --   --  1.0* 1.0*  --  1.4* 1.3* 1.5*  CRP  --   --  11.1*  --   --   --  9.0*  --  3.7* 1.7* 0.9  DDIMER 0.67*  --   --   --   --   --  0.39  --  0.51* 0.47 0.59*  PROCALCITON 0.86  --   --   --   --   --   --  0.52 0.30 0.19 <0.10  LATICACIDVEN 2.4*  --   --  1.7  --   --   --   --   --   --   --   AMMONIA  --   --   --   --  11  --   --   --   --   --    --   BNP  --   --   --   --   --   --  278.3*  --  366.6* 328.9* 489.1*   < > = values in this interval not displayed.    ------------------------------------------------------------------------------------------------------------------ No results for input(s): CHOL, HDL, LDLCALC, TRIG, CHOLHDL, LDLDIRECT in the last 72 hours.  No results found for: HGBA1C ------------------------------------------------------------------------------------------------------------------ No results for input(s): TSH, T4TOTAL, T3FREE, THYROIDAB in the last 72 hours.  Invalid input(s): FREET3  Cardiac Enzymes No results for input(s): CKMB, TROPONINI, MYOGLOBIN in the last 168 hours.  Invalid input(s): CK ------------------------------------------------------------------------------------------------------------------    Component Value Date/Time   BNP 489.1 (H) 06/26/2020 0320    Micro Results Recent Results (from the past 240 hour(s))  Blood Culture (routine x 2)     Status: Abnormal   Collection Time: 06/22/20 11:41 AM   Specimen: Right Antecubital; Blood  Result Value Ref Range Status   Specimen Description   Final    RIGHT ANTECUBITAL Performed at [facility], [address] [place], [place] [address]    Special Requests   Final    BOTTLES DRAWN AEROBIC AND ANAEROBIC Blood Culture adequate volume Performed at [facility], [address] [place], [place] [address]    Culture  Setup Time   Final    IN BOTH AEROBIC AND ANAEROBIC BOTTLES GRAM POSITIVE COCCI Organism ID to follow CRITICAL RESULT CALLED TO, READ BACK BY AND VERIFIED WITH: Lana Fish PharmD 8:25 06/23/20 ([email or web address]) Performed at [facility], [address], [place], [place] [address]    Culture ENTEROCOCCUS FAECALIS (A)  Final   Report Status 06/25/2020 FINAL  Final   Organism ID, Bacteria ENTEROCOCCUS FAECALIS  Final      Susceptibility   Enterococcus faecalis - MIC*    AMPICILLIN <=2  SENSITIVE Sensitive     VANCOMYCIN 2 SENSITIVE Sensitive     GENTAMICIN SYNERGY SENSITIVE Sensitive     * ENTEROCOCCUS FAECALIS  Blood Culture ID Panel (Reflexed)     Status: Abnormal   Collection Time: 06/22/20 11:41 AM  Result Value Ref Range Status   Enterococcus faecalis DETECTED (A) NOT DETECTED Final    Comment: CRITICAL RESULT CALLED TO, READ BACK BY AND VERIFIED WITH: Lana Fish PharmD 8:25 06/23/20 ([email or web address])    Enterococcus Faecium NOT DETECTED NOT DETECTED Final   Listeria monocytogenes NOT DETECTED NOT DETECTED Final   Staphylococcus species NOT DETECTED NOT DETECTED Final   Staphylococcus aureus (BCID) NOT DETECTED NOT DETECTED Final   Staphylococcus  epidermidis NOT DETECTED NOT DETECTED Final   Staphylococcus lugdunensis NOT DETECTED NOT DETECTED Final   Streptococcus species NOT DETECTED NOT DETECTED Final   Streptococcus agalactiae NOT DETECTED NOT DETECTED Final   Streptococcus pneumoniae NOT DETECTED NOT DETECTED Final   Streptococcus pyogenes NOT DETECTED NOT DETECTED Final   A.calcoaceticus-baumannii NOT DETECTED NOT DETECTED Final   Bacteroides fragilis NOT DETECTED NOT DETECTED Final   Enterobacterales NOT DETECTED NOT DETECTED Final   Enterobacter cloacae complex NOT DETECTED NOT DETECTED Final   Escherichia coli NOT DETECTED NOT DETECTED Final   Klebsiella aerogenes NOT DETECTED NOT DETECTED Final   Klebsiella oxytoca NOT DETECTED NOT DETECTED Final   Klebsiella pneumoniae NOT DETECTED NOT DETECTED Final   Proteus species NOT DETECTED NOT DETECTED Final   Salmonella species NOT DETECTED NOT DETECTED Final   Serratia marcescens NOT DETECTED NOT DETECTED Final   Haemophilus influenzae NOT DETECTED NOT DETECTED Final   Neisseria meningitidis NOT DETECTED NOT DETECTED Final   Pseudomonas aeruginosa NOT DETECTED NOT DETECTED Final   Stenotrophomonas maltophilia NOT DETECTED NOT DETECTED Final   Candida albicans NOT DETECTED NOT DETECTED Final   Candida auris NOT  DETECTED NOT DETECTED Final   Candida glabrata NOT DETECTED NOT DETECTED Final   Candida krusei NOT DETECTED NOT DETECTED Final   Candida parapsilosis NOT DETECTED NOT DETECTED Final   Candida tropicalis NOT DETECTED NOT DETECTED Final   Cryptococcus neoformans/gattii NOT DETECTED NOT DETECTED Final   Vancomycin resistance NOT DETECTED NOT DETECTED Final    Comment: Performed at [facility], [address] [place], [place] [address]  Respiratory Panel by RT PCR (Flu A&B, Covid) - Nasopharyngeal Swab     Status: Abnormal   Collection Time: 06/22/20 11:43 AM   Specimen: Nasopharyngeal Swab  Result Value Ref Range Status   SARS Coronavirus 2 by RT PCR POSITIVE (A) NEGATIVE Final    Comment: RESULT CALLED TO, READ BACK BY AND VERIFIED WITH:  COBLE SAM,RN  ON 06/22/2020, [email or web address] (NOTE) SARS-CoV-2 target nucleic acids are DETECTED.  SARS-CoV-2 RNA is generally detectable in upper respiratory specimens  during the acute phase of infection. Positive results are indicative of the presence of the identified virus, but do not rule out bacterial infection or co-infection with other pathogens not detected by the test. Clinical correlation with patient history and other diagnostic information is necessary to determine patient infection status. The expected result is Negative.  Fact Sheet for Patients:  https://www.moore.com/  Fact Sheet for Healthcare Providers: https://www.young.biz/  This test is not yet approved or cleared by the [place] FDA and  has been authorized for detection and/or diagnosis of SARS-CoV-2 by FDA under an Emergency Use Authorization (EUA).  This EUA will remain in effect (meaning this  test can be used) for the duration of  the COVID-19 declaration under Section 564(b)(1) of the Act, 21 U.S.C. section 360bbb-3(b)(1), unless the authorization is terminated or revoked sooner.      Influenza A by PCR  NEGATIVE NEGATIVE Final   Influenza B by PCR NEGATIVE NEGATIVE Final    Comment: (NOTE) The Xpert Xpress SARS-CoV-2/FLU/RSV assay is intended as an aid in  the diagnosis of influenza from Nasopharyngeal swab specimens and  should not be used as a sole basis for treatment. Nasal washings and  aspirates are unacceptable for Xpert Xpress SARS-CoV-2/FLU/RSV  testing.  Fact Sheet for Patients: https://www.moore.com/  Fact Sheet for Healthcare Providers: https://www.young.biz/  This test is not yet approved or cleared by the [place]  [place] FDA and  has been authorized for detection and/or diagnosis of SARS-CoV-2 by  FDA under an Emergency Use Authorization (EUA). This EUA will remain  in effect (meaning this test can be used) for the duration of the  Covid-19 declaration under Section 564(b)(1) of the Act, 21  U.S.C. section 360bbb-3(b)(1), unless the authorization is  terminated or revoked. Performed at [facility], [address] [place], [place] [address]   Blood Culture (routine x 2)     Status: Abnormal   Collection Time: 06/22/20 11:58 AM   Specimen: Left Antecubital; Blood  Result Value Ref Range Status   Specimen Description   Final    LEFT ANTECUBITAL Performed at [facility], [address] [place], [place] [address]    Special Requests   Final    BOTTLES DRAWN AEROBIC AND ANAEROBIC Blood Culture adequate volume Performed at [facility], [address] [place], [place] [address]    Culture  Setup Time   Final    IN BOTH AEROBIC AND ANAEROBIC BOTTLES GRAM POSITIVE COCCI CRITICAL VALUE NOTED.  VALUE IS CONSISTENT WITH PREVIOUSLY REPORTED AND CALLED VALUE.    Culture (A)  Final    ENTEROCOCCUS FAECALIS SUSCEPTIBILITIES PERFORMED ON PREVIOUS CULTURE WITHIN THE LAST 5 DAYS. Performed at [facility], [address], [place], [place] [address]    Report Status 06/25/2020 FINAL  Final  MRSA  PCR Screening     Status: None   Collection Time: 06/23/20  8:08 AM   Specimen: Nasal Mucosa; Nasopharyngeal  Result Value Ref Range Status   MRSA by PCR NEGATIVE NEGATIVE Final    Comment:        The GeneXpert MRSA Assay (FDA approved for NASAL specimens only), is one component of a comprehensive MRSA colonization surveillance program. It is not intended to diagnose MRSA infection nor to guide or monitor treatment for MRSA infections. Performed at [facility], [address] [place], [place] [address]   Urine culture     Status: None   Collection Time: 06/23/20 12:01 PM   Specimen: Urine, Clean Catch  Result Value Ref Range Status   Specimen Description   Final    URINE, CLEAN CATCH Performed at [facility], [address] [place], [place] [address]    Special Requests   Final    NONE Performed at [facility], [address] [place], [place] [address]    Culture   Final    NO GROWTH Performed at [facility], [address] [place] [place], [place] [address]    Report Status 06/24/2020 FINAL  Final  Culture, blood (routine x 2)     Status: None (Preliminary result)   Collection Time: 06/24/20  6:50 AM   Specimen: BLOOD LEFT HAND  Result Value Ref Range Status   Specimen Description BLOOD LEFT HAND  Final   Special Requests   Final    BOTTLES DRAWN AEROBIC AND ANAEROBIC Blood Culture adequate volume   Culture   Final    NO GROWTH 2 DAYS Performed at [facility], [address] [place], [place] [address]    Report Status PENDING  Incomplete  Culture, blood (routine x 2)     Status: None (Preliminary result)   Collection Time: 06/24/20  6:50 AM   Specimen: BLOOD RIGHT HAND  Result Value Ref Range Status   Specimen Description BLOOD RIGHT HAND  Final   Special Requests   Final    BOTTLES DRAWN AEROBIC AND ANAEROBIC Blood Culture adequate volume   Culture   Final    NO GROWTH 2 DAYS Performed at [facility], [address], [place], [place] [address]    Report Status PENDING  Incomplete    Radiology Reports CT HEAD WO CONTRAST  Result Date: 06/22/2020 CLINICAL DATA:  Delirium. EXAM: CT HEAD WITHOUT CONTRAST TECHNIQUE: Contiguous axial images were obtained from the base of the skull through the vertex without intravenous contrast. COMPARISON:  Head CT 05/14/2016 FINDINGS: Brain: No intracranial hemorrhage, mass effect, or midline shift. Age related atrophy. No hydrocephalus. The basilar cisterns are patent. Mild to moderate chronic small vessel ischemia with slight progression from 2017. No evidence of territorial infarct or acute ischemia. No extra-axial or intracranial fluid collection. Vascular: Atherosclerosis of skullbase vasculature without hyperdense vessel or abnormal calcification. Skull: No fracture or focal lesion. Sinuses/Orbits: Opacification of the lower right greater than left mastoid air cells, progressed from 2017. Small fluid levels in the left and right side of sphenoid sinus. No acute orbital abnormality. Bilateral cataract resection. Other: None. IMPRESSION: 1. No acute intracranial abnormality. 2. Age related atrophy and chronic small vessel ischemia. 3. Opacification of the lower right greater than left mastoid air cells likely mastoid effusion, progressed from 2017. Small fluid levels in the left and right side of the sphenoid sinus. Electronically Signed   By: Narda Rutherford M.D.   On: 06/22/2020 22:36   DG Chest Port 1 View  Result Date: 06/23/2020 CLINICAL DATA:  Shortness of breath and COVID. EXAM: PORTABLE CHEST 1 VIEW COMPARISON:  06/22/2020 FINDINGS: 0846 hours. Low lung volumes. Progressive patchy ground-glass and consolidative airspace opacity in the lower lungs. The cardio pericardial silhouette is enlarged. No substantial pleural effusion. The visualized bony structures of the thorax show no acute abnormality. Telemetry leads overlie the chest. IMPRESSION: Progressive bibasilar  airspace disease consistent with multifocal pneumonia. Electronically Signed   By: Kennith Center M.D.   On: 06/23/2020 09:04   DG Chest Port 1 View  Result Date: 06/22/2020 CLINICAL DATA:  COVID, altered mental status. EXAM: PORTABLE CHEST 1 VIEW COMPARISON:  10/19/2014. FINDINGS: Low lung volumes. Subtle airspace opacities in the peripheral lung bases. No visible pleural effusions or pneumothorax. Cardiomediastinal silhouette is within normal limits. No evidence of acute osseous abnormality. IMPRESSION: Subtle airspace opacities in the peripheral lung bases, concerning for multifocal pneumonia and consistent with reported COVID diagnosis. Electronically Signed   By: Feliberto Harts MD   On: 06/22/2020 11:39   DG Swallowing Func-Speech Pathology  Result Date: 06/23/2020 Objective Swallowing Evaluation: Type of Study: MBS-Modified Barium Swallow Study  Patient Details Name: DAELAN GATT MRN: [id number] Date of Birth: October 12, 1940 Today's Date: 06/23/2020 Time: SLP Start Time (ACUTE ONLY): 1417 -SLP Stop Time (ACUTE ONLY): 1437 SLP Time Calculation (min) (ACUTE ONLY): 20 min Past Medical History: Past Medical History: Diagnosis Date . Arthritis  . Atrial fibrillation (HCC) 01/31/2015 . Chronic gout  . Coronary artery disease 12/2007  STATUS POST STENTING OF THE LEFT ANTERIOR DESCENDING ARTERY . COVID-19  . Dyslipidemia  . Gout  . Hiatal hernia  . Hyperlipidemia  . Hypertension  . LBP (low back pain)  . Partial small bowel obstruction (HCC)  . RBBB (right bundle branch block)  . SBO (small bowel obstruction) (HCC)   PARTIAL . Second degree AV block, Mobitz type I  . Urinary tract infection   Escherichia coli urinary  tract infection Past Surgical History: Past Surgical History: Procedure Laterality Date . CARDIAC CATHETERIZATION   . HERNIA REPAIR    INGUINAL . ROTATOR CUFF REPAIR   HPI: 79 yo male presenting to ED with generalized weakness, poor food intake, and confusion. COVID positive. PMH including hiatal  hernia, arthritis, A-fib, HTN, AKI, alcohol abuse, and CAD with s/p stenting of L anterior descending artery. CXR Progressive bibasilar airspace disease consistent with multifocal pneumonia.  No data recorded Assessment / Plan / Recommendation CHL IP CLINICAL IMPRESSIONS 06/23/2020 Clinical Impression Pt exhibits mild oropharyngeal dysphagia with aspiration of nectar consistency, and mild vallecular residual. 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 Pt unable to contain entire bolus in oral cavity and moderate amount nectar thick fell over tongue and was aspirated prior to initiation of laryngeal closure (frank volume) with immediate cough. Timing of closure delayed with thin using chin tuck and slight aspiraiton present. Lingual residue present falling into valleculae post swallow. Chin down posture using straw was effective with nectar however pt displays memory deficit and follow through of strategy is questionable. 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 Recommend honey thick liquids, Dys 3, crush meds. full supervision/assist. SLP will work with pt on chin tuck with nectar for possible upgrade.  SLP Visit Diagnosis Dysphagia, oropharyngeal phase (R13.12) Attention and concentration deficit following -- Frontal lobe and executive function deficit following -- Impact on safety and function Mild aspiration risk;Moderate aspiration risk   CHL IP TREATMENT RECOMMENDATION 06/23/2020 Treatment Recommendations Therapy as outlined in treatment plan below   Prognosis 06/23/2020 Prognosis for Safe Diet Advancement (No Data) Barriers to Reach Goals Cognitive deficits Barriers/Prognosis Comment -- CHL IP DIET RECOMMENDATION 06/23/2020 SLP Diet Recommendations Dysphagia 3 (Mech soft) solids;Honey thick liquids Liquid Administration via Cup Medication Administration Crushed with puree Compensations  Minimize environmental distractions;Slow rate;Small sips/bites Postural Changes Seated upright at 90 degrees;Remain semi-upright after after feeds/meals (Comment)   CHL IP OTHER RECOMMENDATIONS 06/23/2020 Recommended Consults -- Oral Care Recommendations Oral care BID Other Recommendations --   CHL IP FOLLOW UP RECOMMENDATIONS 06/23/2020 Follow up Recommendations (No Data)   CHL IP FREQUENCY AND DURATION 06/23/2020 Speech Therapy Frequency (ACUTE ONLY) min 2x/week Treatment Duration 2 weeks      CHL IP ORAL PHASE 06/23/2020 Oral Phase Impaired Oral - Pudding Teaspoon -- Oral - Pudding Cup -- Oral - Honey Teaspoon -- Oral - Honey Cup Lingual/palatal residue;Piecemeal swallowing Oral - Nectar Teaspoon -- Oral - Nectar Cup Decreased bolus cohesion Oral - Nectar Straw Decreased bolus cohesion Oral - Thin Teaspoon -- Oral - Thin Cup -- Oral - Thin Straw -- Oral - Puree Piecemeal swallowing Oral - Mech Soft -- Oral - Regular -- Oral - Multi-Consistency -- Oral - Pill -- Oral Phase - Comment --  CHL IP PHARYNGEAL PHASE 06/23/2020 Pharyngeal Phase Impaired Pharyngeal- Pudding Teaspoon -- Pharyngeal -- Pharyngeal- Pudding Cup -- Pharyngeal -- Pharyngeal- Honey Teaspoon -- Pharyngeal -- Pharyngeal- Honey Cup Pharyngeal residue - valleculae Pharyngeal -- Pharyngeal- Nectar Teaspoon -- Pharyngeal -- Pharyngeal- Nectar Cup Penetration/Aspiration before swallow Pharyngeal Material enters airway, passes BELOW cords and not ejected out despite cough attempt by patient Pharyngeal- Nectar Straw Penetration/Aspiration during swallow Pharyngeal Material enters airway, remains ABOVE vocal cords then ejected out;Material does not enter airway Pharyngeal- Thin Teaspoon NT Pharyngeal -- Pharyngeal- Thin Cup NT Pharyngeal -- Pharyngeal- Thin Straw Penetration/Aspiration during swallow Pharyngeal Material enters airway, passes BELOW cords  without attempt by patient to eject out (silent aspiration) Pharyngeal- Puree Pharyngeal residue -  valleculae Pharyngeal -- Pharyngeal- Mechanical Soft -- Pharyngeal -- Pharyngeal- Regular WFL Pharyngeal -- Pharyngeal- Multi-consistency -- Pharyngeal -- Pharyngeal- Pill -- Pharyngeal -- Pharyngeal Comment --  CHL IP CERVICAL ESOPHAGEAL PHASE 06/23/2020 Cervical Esophageal Phase WFL Pudding Teaspoon -- Pudding Cup -- Honey Teaspoon -- Honey Cup -- Nectar Teaspoon -- Nectar Cup -- Nectar Straw -- Thin Teaspoon -- Thin Cup -- Thin Straw -- Puree -- Mechanical Soft -- Regular -- Multi-consistency -- Pill -- Cervical Esophageal Comment -- Royce Macadamia 06/23/2020, 4:04 PM Breck Coons [employer] administrator Pager [phone] Office [phone]              ECHOCARDIOGRAM COMPLETE  Result Date: 06/23/2020    ECHOCARDIOGRAM REPORT   Patient Name:   RACHAEL ZAPANTA Date of Exam: 06/23/2020 Medical Rec #:  [id number]      Height:       71.0 in Accession #:    [id number]     Weight:       244.7 lb Date of Birth:  04/04/1941      BSA:          2.297 m Patient Age:    79 years       BP:           170/76 mmHg Patient Gender: M              HR:           71 bpm. Exam Location:  Inpatient Procedure: 2D Echo, Cardiac Doppler and Color Doppler Indications:    R07.9* Chest pain, unspecified. Elevated troponin  History:        Patient has prior history of Echocardiogram examinations, most                 recent 02/24/2019. CAD, Abnormal ECG, Arrythmias:RBBB,                 Signs/Symptoms:Hypotension; Risk Factors:Dyslipidemia. Covid 19                 positive. ETOH.  Sonographer:    Sheralyn Boatman RDCS Referring Phys: [id number] ANASTASSIA DOUTOVA  Sonographer Comments: Patient in Fowlaer's position. IMPRESSIONS  1. Left ventricular ejection fraction, by estimation, is 60 to 65%. The left ventricle has normal function. The left ventricle has no regional wall motion abnormalities. There is mild left ventricular hypertrophy. Left ventricular diastolic parameters are consistent with Grade I diastolic dysfunction  (impaired relaxation).  2. Right ventricular systolic function is moderately reduced. The right ventricular size is normal. There is normal pulmonary artery systolic pressure. The estimated right ventricular systolic pressure is 33.2 mmHg. Abnormal RV, would consider PE.  3. Right atrial size was mildly dilated.  4. The mitral valve is normal in structure. No evidence of mitral valve regurgitation. No evidence of mitral stenosis.  5. The aortic valve is tricuspid. Aortic valve regurgitation is trivial. Mild aortic valve sclerosis is present, with no evidence of aortic valve stenosis.  6. The inferior vena cava is normal in size with greater than 50% respiratory variability, suggesting right atrial pressure of 3 mmHg. FINDINGS  Left Ventricle: Left ventricular ejection fraction, by estimation, is 60 to 65%. The left ventricle has normal function. The left ventricle has no regional wall motion abnormalities. The left ventricular internal cavity size was normal in size. There is  mild left ventricular hypertrophy. Left ventricular diastolic parameters are consistent with  Grade I diastolic dysfunction (impaired relaxation). Right Ventricle: The right ventricular size is normal. No increase in right ventricular wall thickness. Right ventricular systolic function is moderately reduced. There is normal pulmonary artery systolic pressure. The tricuspid regurgitant velocity is 2.75 m/s, and with an assumed right atrial pressure of 3 mmHg, the estimated right ventricular systolic pressure is 33.2 mmHg. Left Atrium: Left atrial size was normal in size. Right Atrium: Right atrial size was mildly dilated. Pericardium: There is no evidence of pericardial effusion. Mitral Valve: The mitral valve is normal in structure. No evidence of mitral valve regurgitation. No evidence of mitral valve stenosis. Tricuspid Valve: The tricuspid valve is normal in structure. Tricuspid valve regurgitation is trivial. Aortic Valve: The aortic valve  is tricuspid. Aortic valve regurgitation is trivial. Mild aortic valve sclerosis is present, with no evidence of aortic valve stenosis. Pulmonic Valve: The pulmonic valve was normal in structure. Pulmonic valve regurgitation is not visualized. Aorta: The aortic root is normal in size and structure. Venous: The inferior vena cava is normal in size with greater than 50% respiratory variability, suggesting right atrial pressure of 3 mmHg. IAS/Shunts: No atrial level shunt detected by color flow Doppler.  LEFT VENTRICLE PLAX 2D LVIDd:         3.80 cm     Diastology LVIDs:         2.60 cm     LV e' medial:    4.35 cm/s LV PW:         1.70 cm     LV E/e' medial:  16.9 LV IVS:        1.50 cm     LV e' lateral:   5.33 cm/s LVOT diam:     2.30 cm     LV E/e' lateral: 13.8 LV SV:         97 LV SV Index:   42 LVOT Area:     4.15 cm  LV Volumes (MOD) LV vol d, MOD A2C: 78.7 ml LV vol d, MOD A4C: 84.2 ml LV vol s, MOD A2C: 17.5 ml LV vol s, MOD A4C: 25.2 ml LV SV MOD A2C:     61.2 ml LV SV MOD A4C:     84.2 ml LV SV MOD BP:      64.3 ml RIGHT VENTRICLE             IVC RV S prime:     18.50 cm/s  IVC diam: 2.20 cm TAPSE (M-mode): 3.2 cm LEFT ATRIUM             Index       RIGHT ATRIUM           Index LA diam:        3.70 cm 1.61 cm/m  RA Area:     18.30 cm LA Vol (A2C):   34.1 ml 14.84 ml/m RA Volume:   53.10 ml  23.11 ml/m LA Vol (A4C):   49.7 ml 21.63 ml/m LA Biplane Vol: 43.7 ml 19.02 ml/m  AORTIC VALVE LVOT Vmax:   124.00 cm/s LVOT Vmean:  89.800 cm/s LVOT VTI:    0.233 m  AORTA Ao Root diam: 3.50 cm MITRAL VALVE               TRICUSPID VALVE MV Area (PHT): 3.21 cm    TR Peak grad:   30.2 mmHg MV Decel Time: 236 msec    TR Vmax:        275.00 cm/s MV E  velocity: 73.30 cm/s MV A velocity: 99.05 cm/s  SHUNTS MV E/A ratio:  0.74        Systemic VTI:  0.23 m                            Systemic Diam: 2.30 cm Marca Ancona MD Electronically signed by Marca Ancona MD Signature Date/Time: 06/23/2020/4:53:51 PM    Final     [place] EKG SITE RITE  Result Date: 06/25/2020 If Site Rite image not attached, placement could not be confirmed due to current cardiac rhythm.

## 2020-06-26 NOTE — Progress Notes (Signed)
ID states ok to place PICC line.  PICC Will not be placed today.  Will reevaluate in am.

## 2020-06-27 ENCOUNTER — Inpatient Hospital Stay (HOSPITAL_COMMUNITY): Payer: Medicare Other

## 2020-06-27 DIAGNOSIS — J1282 Pneumonia due to coronavirus disease 2019: Secondary | ICD-10-CM | POA: Diagnosis not present

## 2020-06-27 DIAGNOSIS — E876 Hypokalemia: Secondary | ICD-10-CM | POA: Diagnosis not present

## 2020-06-27 DIAGNOSIS — E872 Acidosis: Secondary | ICD-10-CM | POA: Diagnosis not present

## 2020-06-27 DIAGNOSIS — U071 COVID-19: Secondary | ICD-10-CM | POA: Diagnosis not present

## 2020-06-27 DIAGNOSIS — G9340 Encephalopathy, unspecified: Secondary | ICD-10-CM | POA: Diagnosis not present

## 2020-06-27 LAB — CBC WITH DIFFERENTIAL/PLATELET
Abs Immature Granulocytes: 0.21 10*3/uL — ABNORMAL HIGH (ref 0.00–0.07)
Basophils Absolute: 0 10*3/uL (ref 0.0–0.1)
Basophils Relative: 0 %
Eosinophils Absolute: 0 10*3/uL (ref 0.0–0.5)
Eosinophils Relative: 0 %
HCT: 29.9 % — ABNORMAL LOW (ref 39.0–52.0)
Hemoglobin: 9.7 g/dL — ABNORMAL LOW (ref 13.0–17.0)
Immature Granulocytes: 2 %
Lymphocytes Relative: 3 %
Lymphs Abs: 0.3 10*3/uL — ABNORMAL LOW (ref 0.7–4.0)
MCH: 33.8 pg (ref 26.0–34.0)
MCHC: 32.4 g/dL (ref 30.0–36.0)
MCV: 104.2 fL — ABNORMAL HIGH (ref 80.0–100.0)
Monocytes Absolute: 0.4 10*3/uL (ref 0.1–1.0)
Monocytes Relative: 4 %
Neutro Abs: 9.3 10*3/uL — ABNORMAL HIGH (ref 1.7–7.7)
Neutrophils Relative %: 91 %
Platelets: 247 10*3/uL (ref 150–400)
RBC: 2.87 MIL/uL — ABNORMAL LOW (ref 4.22–5.81)
RDW: 14.2 % (ref 11.5–15.5)
WBC: 10.2 10*3/uL (ref 4.0–10.5)
nRBC: 0 % (ref 0.0–0.2)

## 2020-06-27 LAB — COMPREHENSIVE METABOLIC PANEL
ALT: 82 U/L — ABNORMAL HIGH (ref 0–44)
AST: 76 U/L — ABNORMAL HIGH (ref 15–41)
Albumin: 2.2 g/dL — ABNORMAL LOW (ref 3.5–5.0)
Alkaline Phosphatase: 46 U/L (ref 38–126)
Anion gap: 11 (ref 5–15)
BUN: 16 mg/dL (ref 8–23)
CO2: 24 mmol/L (ref 22–32)
Calcium: 8.7 mg/dL — ABNORMAL LOW (ref 8.9–10.3)
Chloride: 106 mmol/L (ref 98–111)
Creatinine, Ser: 0.99 mg/dL (ref 0.61–1.24)
GFR, Estimated: >60 mL/min (ref >60)
Glucose, Bld: 109 mg/dL — ABNORMAL HIGH (ref 70–99)
Potassium: 3.5 mmol/L (ref 3.5–5.1)
Sodium: 141 mmol/L (ref 135–145)
Total Bilirubin: 0.8 mg/dL (ref 0.3–1.2)
Total Protein: 4.9 g/dL — ABNORMAL LOW (ref 6.5–8.1)

## 2020-06-27 LAB — C-REACTIVE PROTEIN: CRP: 0.7 mg/dL (ref <1.0)

## 2020-06-27 LAB — MAGNESIUM: Magnesium: 1.5 mg/dL — ABNORMAL LOW (ref 1.7–2.4)

## 2020-06-27 LAB — PROCALCITONIN: Procalcitonin: <0.1 ng/mL

## 2020-06-27 LAB — BRAIN NATRIURETIC PEPTIDE: B Natriuretic Peptide: 834.1 pg/mL — ABNORMAL HIGH (ref 0.0–100.0)

## 2020-06-27 LAB — D-DIMER, QUANTITATIVE: D-Dimer, Quant: 0.61 ug/mL-FEU — ABNORMAL HIGH (ref 0.00–0.50)

## 2020-06-27 MED ORDER — MAGNESIUM SULFATE IN D5W 1-5 GM/100ML-% IV SOLN
1.0000 g | Freq: Once | INTRAVENOUS | Status: AC
  Administered 2020-06-27: 1 g via INTRAVENOUS
  Filled 2020-06-27: qty 100

## 2020-06-27 MED ORDER — ENOXAPARIN SODIUM 120 MG/0.8ML ~~LOC~~ SOLN
110.0000 mg | Freq: Two times a day (BID) | SUBCUTANEOUS | Status: DC
  Administered 2020-06-27: 110 mg via SUBCUTANEOUS
  Filled 2020-06-27 (×2): qty 0.74

## 2020-06-27 MED ORDER — LORAZEPAM 2 MG/ML IJ SOLN
2.0000 mg | Freq: Once | INTRAMUSCULAR | Status: AC
  Administered 2020-06-27: 2 mg via INTRAVENOUS
  Filled 2020-06-27: qty 1

## 2020-06-27 MED ORDER — CHLORHEXIDINE GLUCONATE 0.12 % MT SOLN
15.0000 mL | Freq: Two times a day (BID) | OROMUCOSAL | Status: DC
  Administered 2020-06-27 – 2020-07-01 (×7): 15 mL via OROMUCOSAL
  Filled 2020-06-27 (×7): qty 15

## 2020-06-27 MED ORDER — ENOXAPARIN SODIUM 120 MG/0.8ML ~~LOC~~ SOLN
110.0000 mg | Freq: Once | SUBCUTANEOUS | Status: AC
  Administered 2020-06-27: 110 mg via SUBCUTANEOUS
  Filled 2020-06-27: qty 0.74

## 2020-06-27 MED ORDER — MAGNESIUM SULFATE 2 GM/50ML IV SOLN
2.0000 g | Freq: Once | INTRAVENOUS | Status: AC
  Administered 2020-06-27: 2 g via INTRAVENOUS
  Filled 2020-06-27: qty 50

## 2020-06-27 MED ORDER — ORAL CARE MOUTH RINSE
15.0000 mL | Freq: Two times a day (BID) | OROMUCOSAL | Status: DC
  Administered 2020-06-28 – 2020-07-01 (×7): 15 mL via OROMUCOSAL

## 2020-06-27 MED ORDER — THIAMINE HCL 100 MG PO TABS
100.0000 mg | ORAL_TABLET | Freq: Every day | ORAL | Status: DC
  Filled 2020-06-27: qty 1

## 2020-06-27 MED ORDER — ACETAMINOPHEN 650 MG RE SUPP
650.0000 mg | Freq: Four times a day (QID) | RECTAL | Status: DC | PRN
  Administered 2020-06-27 – 2020-06-28 (×2): 650 mg via RECTAL
  Filled 2020-06-27 (×2): qty 1

## 2020-06-27 MED ORDER — WHITE PETROLATUM EX OINT
TOPICAL_OINTMENT | CUTANEOUS | Status: DC | PRN
  Administered 2020-07-08: 0.2 via TOPICAL
  Filled 2020-06-27 (×2): qty 28.35

## 2020-06-27 MED ORDER — MAGIC MOUTHWASH W/LIDOCAINE
2.0000 mL | Freq: Three times a day (TID) | ORAL | Status: DC | PRN
  Administered 2020-06-27 – 2020-06-28 (×3): 2 mL via ORAL
  Filled 2020-06-27 (×4): qty 5

## 2020-06-27 MED ORDER — KETOROLAC TROMETHAMINE 30 MG/ML IJ SOLN
15.0000 mg | Freq: Once | INTRAMUSCULAR | Status: AC
  Administered 2020-06-27: 15 mg via INTRAVENOUS
  Filled 2020-06-27: qty 1

## 2020-06-27 MED ORDER — THIAMINE HCL 100 MG/ML IJ SOLN
100.0000 mg | Freq: Every day | INTRAMUSCULAR | Status: DC
  Administered 2020-06-27 – 2020-07-04 (×8): 100 mg via INTRAVENOUS
  Filled 2020-06-27 (×7): qty 2

## 2020-06-27 MED ORDER — METOPROLOL TARTRATE 5 MG/5ML IV SOLN
5.0000 mg | Freq: Three times a day (TID) | INTRAVENOUS | Status: DC | PRN
  Administered 2020-06-27 – 2020-06-28 (×3): 5 mg via INTRAVENOUS
  Filled 2020-06-27 (×3): qty 5

## 2020-06-27 MED ORDER — SODIUM CHLORIDE 0.9 % IV SOLN
2.0000 g | INTRAVENOUS | Status: DC
  Administered 2020-06-27 – 2020-06-28 (×5): 2 g via INTRAVENOUS
  Filled 2020-06-27 (×12): qty 2000

## 2020-06-27 MED ORDER — PANTOPRAZOLE SODIUM 40 MG IV SOLR
40.0000 mg | Freq: Two times a day (BID) | INTRAVENOUS | Status: DC
  Administered 2020-06-27 (×2): 40 mg via INTRAVENOUS
  Filled 2020-06-27: qty 40

## 2020-06-27 MED ORDER — QUETIAPINE FUMARATE 50 MG PO TABS: 50.0000 mg | ORAL_TABLET | Freq: Every day | ORAL | Status: DC

## 2020-06-27 MED ORDER — POTASSIUM CHLORIDE 2 MEQ/ML IV SOLN
INTRAVENOUS | Status: DC
  Filled 2020-06-27 (×2): qty 1000

## 2020-06-27 NOTE — Progress Notes (Signed)
OT Cancellation Note  Patient Details Name: Gregory Wheeler MRN: [id number] DOB: 1940/11/05   Cancelled Treatment:    Reason Eval/Treat Not Completed: Other (comment) (Pt with increased confusion and agitation; pulling at lines, eye closed, and moaning. Son at bedside. Discussed hold today and will return as schedule allows tomorrow.)  Rachelanne Whidby M Madix Blowe Taygen Acklin MSOT, OTR/L Acute Rehab Pager: [phone] Office: [phone] 06/27/2020, 5:21 PM

## 2020-06-27 NOTE — Progress Notes (Signed)
Patient restless in bed, eyes closed, yelling, constantly moving legs, pulling at foley with legs. Son has to leave but requesting that patient has a Comptroller. Son states that he cannot leave his father like this. This RN paged Chotiner, MD asking for suppository/IV pain medication since patient not taking PO. Patient still has soft wrist restraints in place for patients safety. Patient constantly pulling at restraints, not redirectable, not following any commands.This RN assigned NT for 1 on 1 supervision for patient safety and per son's request. Will continue to monitor.

## 2020-06-27 NOTE — Progress Notes (Signed)
Patient very anxious, restless, moving all over the bed, pulling at catheter, putting leg over the siderail. Patient has blood in his mouth and on his lip. This RN cannot tell if this came from his lip, tongue, or both. Sitter having to continuously keep patient in the bed and prevent pulling of his catheter. This RN gave Haldol IV per PRN orders at 0052. This had no effect on patient. Ativan 2mg  given IV per PRN orders at 0109. Patient remains restless and agitated. Chotiner, MD notified and states to give 2 mg additional Ativan. NT remains in room with patient. Will continue to monitor.

## 2020-06-27 NOTE — Progress Notes (Signed)
PT Cancellation Note  Patient Details Name: Gregory Wheeler MRN: [id number] DOB: 06-Aug-1941   Cancelled Treatment:    Reason Eval/Treat Not Completed: Other (comment) Pt has been very confused, agitated, pulling at lines, moaning today.  Son was with pt.  Discussed would hold therapy today and f/u as able. Anise Salvo, PT Acute Rehab Services Pager [phone] [facility] [phone]    Rayetta Humphrey 06/27/2020, 3:34 PM

## 2020-06-27 NOTE — Progress Notes (Signed)
Gregory Wheeler for Infectious Disease   Reason for visit: Follow up on Enterococcal bacteremia  Interval History: repeat blood cultures remain ngtd. picc line now appropriately placed  Physical Exam: Constitutional:  Vitals:   06/27/20 0421 06/27/20 0426  BP:    Pulse: (!) 53 67  Resp: 20 18  Temp:    SpO2: (!) 85% 91%   Impression: bacteremia and possible endocarditis.    Plan: 1.  Ampicillin + ceftriaxone for 6 weeks through 12/9.  Will do a repeat TTE after discharge to reevaluate since TEE not possible at this time. OPAT placed.   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    Labs weekly while on IV antibiotics: _x_ CBC with differential __ BMP _x_ CMP __ CRP __ ESR __ Vancomycin trough __ CK  _x_ Please pull PIC at completion of IV antibiotics __ Please leave PIC in place until doctor has seen patient or been notified  Fax weekly labs to [phone]  Clinic Follow Up Appt: 12/2  @ 11:15 with Dr. Linus Salmons

## 2020-06-27 NOTE — Progress Notes (Signed)
Patient remains restless and agitated. Patient repeatedly ends up sideways in the bed. NT sitter constantly having to prevent patient from pulling at catheter. Chotiner, MD notified. Soft wrist restraints applied at this time. Lesa, NT remains in room with patient. When this RN spoke to son last night, he stated that was the last time he would call for the night, and he would call in the morning, so staff will notify son at that time that patient is now in restraints. Will continue to monitor.

## 2020-06-27 NOTE — Progress Notes (Signed)
Peripherally Inserted Central Catheter Placement  The IV Nurse has discussed with the patient and/or persons authorized to consent for the patient, the purpose of this procedure and the potential benefits and risks involved with this procedure.  The benefits include less needle sticks, lab draws from the catheter, and the patient may be discharged home with the catheter. Risks include, but not limited to, infection, bleeding, blood clot (thrombus formation), and puncture of an artery; nerve damage and irregular heartbeat and possibility to perform a PICC exchange if needed/ordered by physician.  Alternatives to this procedure were also discussed.  Bard Power PICC patient education guide, fact sheet on infection prevention and patient information card has been provided to patient /or left at bedside.    PICC Placement Documentation  PICC Single Lumen 06/27/20 PICC Left Brachial 44 cm 1 cm (Active)  Indication for Insertion or Continuance of Line Home intravenous therapies (PICC only) 06/27/20 0900  Exposed Catheter (cm) 1 cm 06/27/20 0900  Site Assessment Clean;Dry;Intact 06/27/20 0900  Line Status Flushed;Blood return noted 06/27/20 0900  Dressing Type Transparent 06/27/20 0900  Dressing Status Clean;Dry;Intact 06/27/20 0900  Dressing Change Due 07/04/20 06/27/20 0900       Stacie Glaze Horton 06/27/2020, 9:06 AM

## 2020-06-27 NOTE — Progress Notes (Addendum)
PROGRESS NOTE                                                                                                                                                                                                             Patient Demographics:    Gregory Wheeler, is a 79 y.o. male, DOB - 19-Mar-1941, ZOX:[id number]  Outpatient Primary MD for the patient is Jacqualine Mau, NP    LOS - 5  Admit date - 06/22/2020    Chief Complaint  Patient presents with  . Covid pos  and Not eating or drinking       Brief Narrative (HPI from H&P)  - Gregory Wheeler is a 79 y.o. male with medical history significant of Spondylosis, gout, GERD, HTn, p A.fib, iron defficiency anemia, heavy alcohol abuse, CAD, HLD, right bundle branch block, history of small bowel obstruction, history of second-degree AV block Mobitz type I who has been sick for around 2 to 3 weeks before seeking help presented to the ER with shortness of breath and weakness.  Was diagnosed with COVID-19 pneumonia along with possible aspiration pneumonia, also had evidence of severe dehydration and AKI and was admitted to the hospital.   Subjective:   Patient in bed, severely confused agitated unable to answer questions or follow commands.   Assessment  & Plan :     1. Acute Hypoxic Resp. Failure due to Acute Covid 19 Viral Pneumonitis during the ongoing 2020 Covid 19 Pandemic -there is also evidence of possible overlying aspiration pneumonia causing bacterial infection.  He is vaccinated and likely has breakthrough infection, he has finished his steroid and Remdesivir course, speech therapy following, remains very high risk for aspiration due to his encephalopathy, on Unasyn as well for the same.  Overall quite tenuous due to his advanced age and multiple other medical issues.  Encouraged the patient to sit up in chair in the daytime use I-S and flutter valve for pulmonary toiletry and  then prone in bed when at night.  Will advance activity and titrate down oxygen as possible.    SpO2: 91 % O2 Flow Rate (L/min): 2 L/min  Recent Labs  Lab 06/22/20 1143 06/22/20 1143 06/22/20 1144 06/22/20 1405 06/23/20 0140 06/23/20 0626 06/24/20 0650 06/25/20 0133 06/26/20 0320 06/27/20 0420  WBC 17.3*   < >  --   --  6.2  --  11.0* 10.8* 9.9 10.2  HGB 13.2   < >  --   --  10.3*  --  11.6* 10.3* 9.5* 9.7*  HCT 39.5   < >  --   --  31.5*  --  35.3* 30.6* 29.3* 29.9*  PLT 283   < >  --   --  231  --  264 277 240 247  CRP  --    < >   < >  --  9.0*  --  3.7* 1.7* 0.9 0.7  BNP  --   --   --   --  278.3*  --  366.6* 328.9* 489.1* 834.1*  DDIMER 0.67*   < >  --   --  0.39  --  0.51* 0.47 0.59* 0.61*  PROCALCITON 0.86   < >  --   --   --  0.52 0.30 0.19 <0.10 <0.10  AST 31   < >  --   --  31  --  39 62* 77* 76*  ALT 20   < >  --   --  21  --  27 41 67* 82*  ALKPHOS 55   < >  --   --  48  --  49 47 46 46  BILITOT 1.5*   < >  --   --  1.3*  --  0.8 0.6 0.5 0.8  ALBUMIN 2.5*   < >  --   --  1.7*  --  2.1* 2.0* 2.0* 2.2*  LATICACIDVEN 2.4*  --   --  1.7  --   --   --   --   --   --   SARSCOV2NAA POSITIVE*  --   --   --   --   --   --   --   --   --    < > = values in this interval not displayed.     Aspiration pneumonia-coughing with eating.  Along with metabolic encephalopathy, high risk for aspiration, dysphagia 3 diet speech to evaluate. Unasyn.  Enterococcus bacteremia.  ID following.  No clear source, poor candidate for TEE due to COVID-19.  Per ID prolonged IV antibiotics for now.   Currently on Rocephin Unasyn finally should be on Rocephin/ampicillin, placed PICC line on 06/27/2020, ID will specify stop date.  Severe metabolic encephalopathy.  Supportive care, minimize benzodiazepines and narcotics, now in full-blown DTs.  Will allow son to visit on 06/27/2020 to see if we can calm him down.  Enterococcus bacteremia.  ID following.  No clear source, poor candidate for TEE.  Per  ID prolonged IV antibiotics for now.   Currently on Rocephin Unasyn finally should be on Rocephin/ampicillin, will get PICC line.  Severe metabolic encephalopathy.  Supportive care, minimize benzodiazepines and narcotics, now in full-blown DTs.  Becoming extremely sedated, now at risk for aspiration, unable to take oral medications or cooperate in treatment and therapy, hitting at staff members, has to be placed in restraints, extremely poor prognosis chances of aspiration very high, will try family bedside to reduce agitation.  Risks benefits explained to the family members and they are aware of it and eager to help.   Debility will have PT OT evaluate prior to discharge other plan as per orders.  Family wants to take him home with home health PT and RN.  RBBB (right bundle branch block) - chronic stable   Hypertension - continue beta-blocker.   Coronary artery disease -chronic  stable continue beta-blocker patient does not seem to be on a statin.   Chronic atrial fibrillation (HCC) - continue Xarelto and beta-blocker .   AKI (acute kidney injury) (HCC) - hydrate with IV fluids and Foley catheter for retention.  AKI improved.  Urinary retention - Flomax and Foley on 06/23/2020.  Unable to void.  Hypokalemia and hypomagnesemia with severe dehydration-  adding to the family members he was sick for at least 2 to 3 days before he came to the hospital, severely dehydrated, IV fluids and replace electrolytes.  Hypoalbuminemia - protein supplementation added.      Condition - Extremely Guarded COVID-19 pneumonia, severe DTs with likely ongoing aspiration, enterococcal bacteremia.  Family Communication  :   Argentina PonderSon [name] - [phone]- [phone] - 06/23/20 clearly explained life-threatening condition with extremely grave prognosis, updated 06/24/20, 06/25/20, 06/27/2020.  Daughter [phone] on June 23, 2020 clearly explained life-threatening condition with extremely grave prognosis.   Code  Status :  Full  Consults  :  None  Procedures  :    TTE -   1. Left ventricular ejection fraction, by estimation, is 60 to 65%. The left ventricle has normal function. The left ventricle has no regional wall motion abnormalities. There is mild left ventricular hypertrophy. Left ventricular diastolic parameters are consistent with Grade I diastolic dysfunction (impaired relaxation).  2. Right ventricular systolic function is moderately reduced. The right ventricular size is normal. There is normal pulmonary artery systolic pressure. The estimated right ventricular systolic pressure is 33.2 mmHg. Abnormal RV, would consider PE.  3. Right atrial size was mildly dilated.  4. The mitral valve is normal in structure. No evidence of mitral valve regurgitation. No evidence of mitral stenosis.  5. The aortic valve is tricuspid. Aortic valve regurgitation is trivial. Mild aortic valve sclerosis is present, with no evidence of aortic valve stenosis.  6. The inferior vena cava is normal in size with greater than 50% respiratory variability, suggesting right atrial pressure of 3 mmHg.  CT -  1. No acute intracranial abnormality. 2. Age related atrophy and chronic small vessel ischemia. 3. Opacification of the lower right greater than left mastoid air cells likely mastoid effusion, progressed from 2017. Small fluid levels in the left and right side of the sphenoid sinus.  PUD Prophylaxis :   Disposition Plan  :    Status is: Inpatient  Not inpatient appropriate, will call UM team and downgrade to OBS.   Dispo: The patient is from: Home              Anticipated d/c is to: Home              Anticipated d/c date is: > 3 days              Patient currently is not medically stable to d/c.   DVT Prophylaxis  :  Xaralto  Lab Results  Component Value Date   PLT 247 06/27/2020    Diet :  Diet Order            DIET DYS 3 Room service appropriate? No; Fluid consistency: Honey Thick  Diet effective now                    Inpatient Medications  Scheduled Meds: . allopurinol  300 mg Oral QHS  . amLODipine  10 mg Oral Daily  . chlorhexidine  15 mL Mouth Rinse BID  . Chlorhexidine Gluconate Cloth  6 each Topical Daily  . folic acid  1 mg Oral Daily  . lactose free nutrition  237 mL Oral TID WC  . mouth rinse  15 mL Mouth Rinse q12n4p  . metoprolol tartrate  25 mg Oral BID  . multivitamin with minerals  1 tablet Oral Daily  . nitroGLYCERIN  0.5 inch Topical Q6H  . pantoprazole (PROTONIX) IV  40 mg Intravenous Q12H  . sodium chloride flush  3 mL Intravenous Q12H  . tamsulosin  0.4 mg Oral Daily  . thiamine injection  100 mg Intravenous Daily  . zinc sulfate  220 mg Oral Daily   Continuous Infusions: . ampicillin-sulbactam (UNASYN) IV 3 g (06/27/20 1610)  . cefTRIAXone (ROCEPHIN)  IV 2 g (06/27/20 1042)  . dextrose 5 % lactated ringers with kcl    . magnesium sulfate bolus IVPB     PRN Meds:.acetaminophen, guaiFENesin-dextromethorphan, haloperidol lactate, hydrALAZINE, metoprolol tartrate, [DISCONTINUED] ondansetron **OR** ondansetron (ZOFRAN) IV, Resource ThickenUp Clear  Antibiotics  :    Anti-infectives (From admission, onward)   Start     Dose/Rate Route Frequency Ordered Stop   06/25/20 1947  Ampicillin-Sulbactam (UNASYN) 3 g in sodium chloride 0.9 % 100 mL IVPB        3 g 200 mL/hr over 30 Minutes Intravenous Every 6 hours 06/25/20 1419     06/24/20 2200  cefTRIAXone (ROCEPHIN) 2 g in sodium chloride 0.9 % 100 mL IVPB        2 g 200 mL/hr over 30 Minutes Intravenous Every 12 hours 06/24/20 1513     06/24/20 1230  cefTRIAXone (ROCEPHIN) 2 g in sodium chloride 0.9 % 100 mL IVPB  Status:  Discontinued        2 g 200 mL/hr over 30 Minutes Intravenous Every 24 hours 06/24/20 1139 06/24/20 1513   06/23/20 1430  azithromycin (ZITHROMAX) 500 mg in sodium chloride 0.9 % 250 mL IVPB  Status:  Discontinued        500 mg 250 mL/hr over 60 Minutes Intravenous Every 24 hours 06/22/20  1406 06/22/20 1410   06/23/20 1300  Ampicillin-Sulbactam (UNASYN) 3 g in sodium chloride 0.9 % 100 mL IVPB  Status:  Discontinued        3 g 200 mL/hr over 30 Minutes Intravenous Every 8 hours 06/23/20 1131 06/25/20 1419   06/23/20 1000  remdesivir 100 mg in sodium chloride 0.9 % 100 mL IVPB        100 mg 200 mL/hr over 30 Minutes Intravenous Daily 06/22/20 1345 06/26/20 1200   06/23/20 0945  ampicillin (OMNIPEN) 2 g in sodium chloride 0.9 % 100 mL IVPB  Status:  Discontinued        2 g 300 mL/hr over 20 Minutes Intravenous Every 4 hours 06/23/20 0852 06/23/20 1131   06/22/20 1430  azithromycin (ZITHROMAX) 500 mg in sodium chloride 0.9 % 250 mL IVPB  Status:  Discontinued        500 mg 250 mL/hr over 60 Minutes Intravenous Every 24 hours 06/22/20 1410 06/23/20 0902   06/22/20 1400  remdesivir 100 mg in sodium chloride 0.9 % 100 mL IVPB        100 mg 200 mL/hr over 30 Minutes Intravenous Every 30 min 06/22/20 1345 06/22/20 1518   06/22/20 1400  cefTRIAXone (ROCEPHIN) 2 g in sodium chloride 0.9 % 100 mL IVPB  Status:  Discontinued        2 g 200 mL/hr over 30 Minutes Intravenous Every 24 hours 06/22/20 1357 06/23/20 0902   06/22/20 1400  azithromycin (ZITHROMAX) 500  mg in sodium chloride 0.9 % 250 mL IVPB  Status:  Discontinued        500 mg 250 mL/hr over 60 Minutes Intravenous Every 24 hours 06/22/20 1357 06/22/20 1406       Time Spent in minutes  30   Susa Raring M.D on 06/27/2020 at 11:14 AM  To page go to [email or web address] - password [facility]  [facility] -  Office  [phone]   See all Orders from today for further details    Objective:   Vitals:   06/26/20 2310 06/27/20 0414 06/27/20 0421 06/27/20 0426  BP:  140/84    Pulse: (!) 55 62 (!) 53 67  Resp: (!) 25 (!) 21 20 18   Temp:  98.2 F (36.8 C)    TempSrc:  Axillary    SpO2: 92% 91% (!) 85% 91%  Weight:      Height:        Wt Readings from Last 3 Encounters:  06/23/20 111 kg  03/14/20 110.3 kg  09/17/19  106.6 kg     Intake/Output Summary (Last 24 hours) at 06/27/2020 1114 Last data filed at 06/27/2020 0900 Gross per 24 hour  Intake 1130.54 ml  Output 1575 ml  Net -444.46 ml     Physical Exam  Somnolent and severely confused, wearing mittens and under arm restraints, moves all 4 extremities [place].AT,PERRAL Supple Neck,No JVD, No cervical lymphadenopathy appriciated.  Symmetrical Chest wall movement, Good air movement bilaterally, CTAB RRR,No Gallops, Rubs or new Murmurs, No Parasternal Heave +ve B.Sounds, Abd Soft, No tenderness, No organomegaly appriciated, No rebound - guarding or rigidity. No Cyanosis, Clubbing or edema, No new Rash or bruise     Data Review:    CBC Recent Labs  Lab 06/23/20 0140 06/24/20 0650 06/25/20 0133 06/26/20 0320 06/27/20 0420  WBC 6.2 11.0* 10.8* 9.9 10.2  HGB 10.3* 11.6* 10.3* 9.5* 9.7*  HCT 31.5* 35.3* 30.6* 29.3* 29.9*  PLT 231 264 277 240 247  MCV 103.6* 102.0* 103.0* 103.5* 104.2*  MCH 33.9 33.5 34.7* 33.6 33.8  MCHC 32.7 32.9 33.7 32.4 32.4  RDW 13.9 13.7 13.9 14.1 14.2  LYMPHSABS 0.5* 0.6* 0.4* 0.4* 0.3*  MONOABS 0.2 0.4 0.5 0.5 0.4  EOSABS 0.0 0.0 0.0 0.0 0.0  BASOSABS 0.0 0.0 0.0 0.0 0.0    Recent Labs  Lab  0000 06/22/20 1143 06/22/20 1143 06/22/20 1144 06/22/20 1405 06/22/20 2235 06/22/20 2236 06/23/20 0140 06/23/20 0626 06/24/20 0650 06/25/20 0133 06/26/20 0320 06/27/20 0420  NA  --  134*  --   --   --   --    < > 135  --  137 138 140 141  K  --  3.2*  --   --   --   --    < > 3.6  --  3.4* 3.3* 3.8 3.5  CL  --  95*  --   --   --   --    < > 103  --  101 103 106 106  CO2  --  24  --   --   --   --    < > 23  --  23 25 26 24   GLUCOSE  --  114*  --   --   --   --    < > 144*  --  146* 130* 132* 109*  BUN  --  23  --   --   --   --    < > 22  --  22 19 17 16   CREATININE  --  1.34*  --   --   --   --    < > 1.22  --  1.07 1.05 0.93 0.99  CALCIUM  --  8.4*  --   --   --   --    < > 7.9*  --  8.5* 8.6* 8.8* 8.7*    AST   < > 31  --   --   --   --   --  31  --  39 62* 77* 76*  ALT   < > 20  --   --   --   --   --  21  --  27 41 67* 82*  ALKPHOS   < > 55  --   --   --   --   --  48  --  49 47 46 46  BILITOT   < > 1.5*  --   --   --   --   --  1.3*  --  0.8 0.6 0.5 0.8  ALBUMIN   < > 2.5*  --   --   --   --   --  1.7*  --  2.1* 2.0* 2.0* 2.2*  MG  --   --   --   --   --   --    < > 1.0*  --  1.4* 1.3* 1.5* 1.5*  CRP   < >  --   --    < >  --   --   --  9.0*  --  3.7* 1.7* 0.9 0.7  DDIMER   < > 0.67*  --   --   --   --   --  0.39  --  0.51* 0.47 0.59* 0.61*  PROCALCITON  --  0.86   < >  --   --   --   --   --  0.52 0.30 0.19 <0.10 <0.10  LATICACIDVEN  --  2.4*  --   --  1.7  --   --   --   --   --   --   --   --   AMMONIA  --   --   --   --   --  11  --   --   --   --   --   --   --   BNP  --   --   --   --   --   --   --  278.3*  --  366.6* 328.9* 489.1* 834.1*   < > = values in this interval not displayed.    ------------------------------------------------------------------------------------------------------------------ No results for input(s): CHOL, HDL, LDLCALC, TRIG, CHOLHDL, LDLDIRECT in the last 72 hours.  No results found for: HGBA1C ------------------------------------------------------------------------------------------------------------------ No results for input(s): TSH, T4TOTAL, T3FREE, THYROIDAB in the last 72 hours.  Invalid input(s): FREET3  Cardiac Enzymes No results for input(s): CKMB, TROPONINI, MYOGLOBIN in the last 168 hours.  Invalid input(s): CK ------------------------------------------------------------------------------------------------------------------    Component Value Date/Time   BNP 834.1 (H) 06/27/2020 0420    Micro Results Recent Results (from the past 240 hour(s))  Blood Culture (routine x 2)     Status: Abnormal   Collection Time: 06/22/20 11:41 AM   Specimen: Right Antecubital; Blood  Result Value Ref Range Status   Specimen Description   Final     RIGHT ANTECUBITAL Performed at [facility], [address] [place], [place] [address]    Special Requests   Final    BOTTLES DRAWN AEROBIC AND ANAEROBIC Blood Culture adequate volume Performed at [facility], [address] [place], [place] [address]    Culture  Setup Time   Final    IN BOTH AEROBIC AND ANAEROBIC BOTTLES GRAM POSITIVE COCCI Organism ID to follow CRITICAL RESULT CALLED TO, READ BACK BY AND VERIFIED WITH: Lana Fish PharmD 8:25 06/23/20 ([email or web address]) Performed at [facility], [address] [place], [place] [address]    Culture ENTEROCOCCUS FAECALIS (A)  Final   Report Status 06/25/2020 FINAL  Final   Organism ID, Bacteria ENTEROCOCCUS FAECALIS  Final      Susceptibility   Enterococcus faecalis - MIC*    AMPICILLIN <=2 SENSITIVE Sensitive     VANCOMYCIN 2 SENSITIVE Sensitive     GENTAMICIN SYNERGY SENSITIVE Sensitive     * ENTEROCOCCUS FAECALIS  Blood Culture ID Panel (Reflexed)     Status: Abnormal   Collection Time: 06/22/20 11:41 AM  Result Value Ref Range Status   Enterococcus faecalis DETECTED (A) NOT DETECTED Final    Comment: CRITICAL RESULT CALLED TO, READ BACK BY AND VERIFIED WITH: Lana Fish PharmD 8:25 06/23/20 ([email or web address])    Enterococcus Faecium NOT DETECTED NOT DETECTED Final   Listeria monocytogenes NOT DETECTED NOT DETECTED Final   Staphylococcus species NOT DETECTED NOT DETECTED Final   Staphylococcus aureus (BCID) NOT DETECTED NOT DETECTED Final   Staphylococcus epidermidis NOT DETECTED NOT DETECTED Final   Staphylococcus lugdunensis NOT DETECTED NOT DETECTED Final   Streptococcus species NOT DETECTED NOT DETECTED Final   Streptococcus agalactiae NOT DETECTED NOT DETECTED Final   Streptococcus pneumoniae NOT DETECTED NOT DETECTED Final   Streptococcus pyogenes NOT DETECTED NOT DETECTED Final   A.calcoaceticus-baumannii NOT DETECTED NOT DETECTED Final   Bacteroides fragilis NOT DETECTED NOT DETECTED Final    Enterobacterales NOT DETECTED NOT DETECTED Final   Enterobacter cloacae complex NOT DETECTED NOT DETECTED Final   Escherichia coli NOT DETECTED NOT DETECTED Final   Klebsiella aerogenes NOT DETECTED NOT DETECTED Final   Klebsiella oxytoca NOT DETECTED NOT DETECTED Final   Klebsiella pneumoniae NOT DETECTED NOT DETECTED Final   Proteus species NOT DETECTED NOT DETECTED Final   Salmonella species NOT DETECTED NOT DETECTED Final   Serratia marcescens NOT DETECTED NOT DETECTED Final   Haemophilus influenzae NOT DETECTED NOT DETECTED Final   Neisseria meningitidis NOT DETECTED NOT DETECTED Final   Pseudomonas aeruginosa NOT DETECTED NOT DETECTED Final   Stenotrophomonas maltophilia NOT DETECTED NOT DETECTED Final   Candida albicans NOT DETECTED NOT DETECTED Final   Candida auris NOT DETECTED NOT DETECTED Final   Candida glabrata NOT DETECTED NOT DETECTED Final   Candida krusei NOT DETECTED NOT DETECTED Final   Candida parapsilosis NOT DETECTED NOT DETECTED Final   Candida tropicalis NOT DETECTED NOT DETECTED Final   Cryptococcus neoformans/gattii NOT DETECTED NOT DETECTED Final   Vancomycin resistance NOT DETECTED NOT DETECTED Final    Comment: Performed at [facility], [address], [place], [place] [address]  Respiratory Panel by RT PCR (Flu A&B, Covid) - Nasopharyngeal Swab     Status: Abnormal   Collection Time: 06/22/20 11:43 AM   Specimen: Nasopharyngeal Swab  Result Value Ref Range Status   SARS Coronavirus 2 by RT PCR POSITIVE (A) NEGATIVE Final    Comment: RESULT CALLED TO, READ BACK BY AND VERIFIED WITH:  COBLE SAM,RN @1323  ON 06/22/2020, [email or web address] (  NOTE) SARS-CoV-2 target nucleic acids are DETECTED.  SARS-CoV-2 RNA is generally detectable in upper respiratory specimens  during the acute phase of infection. Positive results are indicative of the presence of the identified virus, but do not rule out bacterial infection or co-infection with other pathogens  not detected by the test. Clinical correlation with patient history and other diagnostic information is necessary to determine patient infection status. The expected result is Negative.  Fact Sheet for Patients:  https://www.moore.com/  Fact Sheet for Healthcare Providers: https://www.young.biz/  This test is not yet approved or cleared by the [place] FDA and  has been authorized for detection and/or diagnosis of SARS-CoV-2 by FDA under an Emergency Use Authorization (EUA).  This EUA will remain in effect (meaning this  test can be used) for the duration of  the COVID-19 declaration under Section 564(b)(1) of the Act, 21 U.S.C. section 360bbb-3(b)(1), unless the authorization is terminated or revoked sooner.      Influenza A by PCR NEGATIVE NEGATIVE Final   Influenza B by PCR NEGATIVE NEGATIVE Final    Comment: (NOTE) The Xpert Xpress SARS-CoV-2/FLU/RSV assay is intended as an aid in  the diagnosis of influenza from Nasopharyngeal swab specimens and  should not be used as a sole basis for treatment. Nasal washings and  aspirates are unacceptable for Xpert Xpress SARS-CoV-2/FLU/RSV  testing.  Fact Sheet for Patients: https://www.moore.com/  Fact Sheet for Healthcare Providers: https://www.young.biz/  This test is not yet approved or cleared by the [place] FDA and  has been authorized for detection and/or diagnosis of SARS-CoV-2 by  FDA under an Emergency Use Authorization (EUA). This EUA will remain  in effect (meaning this test can be used) for the duration of the  Covid-19 declaration under Section 564(b)(1) of the Act, 21  U.S.C. section 360bbb-3(b)(1), unless the authorization is  terminated or revoked. Performed at [facility], [address] [place], [place] [address]   Blood Culture (routine x 2)     Status: Abnormal   Collection Time: 06/22/20 11:58 AM    Specimen: Left Antecubital; Blood  Result Value Ref Range Status   Specimen Description   Final    LEFT ANTECUBITAL Performed at [facility], [address] [place], [place] [address]    Special Requests   Final    BOTTLES DRAWN AEROBIC AND ANAEROBIC Blood Culture adequate volume Performed at [facility], [address] [place], [place] [address]    Culture  Setup Time   Final    IN BOTH AEROBIC AND ANAEROBIC BOTTLES GRAM POSITIVE COCCI CRITICAL VALUE NOTED.  VALUE IS CONSISTENT WITH PREVIOUSLY REPORTED AND CALLED VALUE.    Culture (A)  Final    ENTEROCOCCUS FAECALIS SUSCEPTIBILITIES PERFORMED ON PREVIOUS CULTURE WITHIN THE LAST 5 DAYS. Performed at [facility], [address] [place], [place] [address]    Report Status 06/25/2020 FINAL  Final  MRSA PCR Screening     Status: None   Collection Time: 06/23/20  8:08 AM   Specimen: Nasal Mucosa; Nasopharyngeal  Result Value Ref Range Status   MRSA by PCR NEGATIVE NEGATIVE Final    Comment:        The GeneXpert MRSA Assay (FDA approved for NASAL specimens only), is one component of a comprehensive MRSA colonization surveillance program. It is not intended to diagnose MRSA infection nor to guide or monitor treatment for MRSA infections. Performed at [facility], [address] [place], [place] [address]   Urine culture     Status: None   Collection Time: 06/23/20 12:01 PM   Specimen: Urine, Clean Catch  Result Value Ref Range Status   Specimen Description   Final    URINE, CLEAN CATCH Performed at [facility], [address] [place], [place] [address]    Special Requests   Final    NONE Performed at [facility], [address] [place], [place] [address]    Culture   Final    NO GROWTH Performed at [facility], [address] [place], [place] [address]    Report Status 06/24/2020 FINAL  Final  Culture, blood (routine x 2)     Status: None  (Preliminary result)   Collection Time: 06/24/20  6:50 AM   Specimen: BLOOD LEFT HAND  Result Value Ref Range Status   Specimen Description BLOOD LEFT HAND  Final   Special Requests   Final    BOTTLES DRAWN AEROBIC AND ANAEROBIC Blood Culture adequate volume   Culture   Final    NO GROWTH 3 DAYS Performed at [facility], [address] [place], [place] [address]    Report Status PENDING  Incomplete  Culture, blood (routine x 2)     Status: None (Preliminary result)   Collection Time: 06/24/20  6:50 AM   Specimen: BLOOD RIGHT HAND  Result Value Ref Range Status   Specimen Description BLOOD RIGHT HAND  Final   Special Requests   Final    BOTTLES DRAWN AEROBIC AND ANAEROBIC Blood Culture adequate volume   Culture   Final    NO GROWTH 3 DAYS Performed at [facility], [address], [place], [place] [address]    Report Status PENDING  Incomplete    Radiology Reports CT HEAD WO CONTRAST  Result Date: 06/22/2020 CLINICAL DATA:  Delirium. EXAM: CT HEAD WITHOUT CONTRAST TECHNIQUE: Contiguous axial images were obtained from the base of the skull through the vertex without intravenous contrast. COMPARISON:  Head CT 05/14/2016 FINDINGS: Brain: No intracranial hemorrhage, mass effect, or midline shift. Age related atrophy. No hydrocephalus. The basilar cisterns are patent. Mild to moderate chronic small vessel ischemia with slight progression from 2017. No evidence of territorial infarct or acute ischemia. No extra-axial or intracranial fluid collection. Vascular: Atherosclerosis of skullbase vasculature without hyperdense vessel or abnormal calcification. Skull: No fracture or focal lesion. Sinuses/Orbits: Opacification of the lower right greater than left mastoid air cells, progressed from 2017. Small fluid levels in the left and right side of sphenoid sinus. No acute orbital abnormality. Bilateral cataract resection. Other: None. IMPRESSION: 1. No acute intracranial abnormality.  2. Age related atrophy and chronic small vessel ischemia. 3. Opacification of the lower right greater than left mastoid air cells likely mastoid effusion, progressed from 2017. Small fluid levels in the left and right side of the sphenoid sinus. Electronically Signed   By: Narda Rutherford M.D.   On: 06/22/2020 22:36   DG CHEST PORT 1 VIEW  Result Date: 06/27/2020 CLINICAL DATA:  PICC in place EXAM: PORTABLE CHEST 1 VIEW COMPARISON:  06/27/2020 FINDINGS: Left arm PICC terminates in the mid SVC. Subpleural patchy opacities in the lungs bilaterally, left lower lobe predominant, compatible with pneumonia in this patient with known COVID. No pleural effusion or pneumothorax. The heart is top-normal in size. IMPRESSION: Left arm PICC terminates in the mid SVC. Multifocal pneumonia in this patient with known COVID.  Electronically Signed   By: Charline Bills M.D.   On: 06/27/2020 09:35   DG Chest Port 1 View  Result Date: 06/27/2020 CLINICAL DATA:  Shortness of breath, COVID-19 positive. EXAM: PORTABLE CHEST 1 VIEW COMPARISON:  June 23, 2020. FINDINGS: Stable cardiomegaly. Stable multiple airspace opacities are noted bilaterally consistent with multifocal pneumonia. No pneumothorax or pleural effusion is noted. Bony thorax is unremarkable. IMPRESSION: Stable bilateral multifocal pneumonia. Electronically Signed   By: Lupita Raider M.D.   On: 06/27/2020 08:07   DG Chest Port 1 View  Result Date: 06/23/2020 CLINICAL DATA:  Shortness of breath and COVID. EXAM: PORTABLE CHEST 1 VIEW COMPARISON:  06/22/2020 FINDINGS: 0846 hours. Low lung volumes. Progressive patchy ground-glass and consolidative airspace opacity in the lower lungs. The cardio pericardial silhouette is enlarged. No substantial pleural effusion. The visualized bony structures of the thorax show no acute abnormality. Telemetry leads overlie the chest. IMPRESSION: Progressive bibasilar airspace disease consistent with multifocal pneumonia.  Electronically Signed   By: Kennith Center M.D.   On: 06/23/2020 09:04   DG Chest Port 1 View  Result Date: 06/22/2020 CLINICAL DATA:  COVID, altered mental status. EXAM: PORTABLE CHEST 1 VIEW COMPARISON:  10/19/2014. FINDINGS: Low lung volumes. Subtle airspace opacities in the peripheral lung bases. No visible pleural effusions or pneumothorax. Cardiomediastinal silhouette is within normal limits. No evidence of acute osseous abnormality. IMPRESSION: Subtle airspace opacities in the peripheral lung bases, concerning for multifocal pneumonia and consistent with reported COVID diagnosis. Electronically Signed   By: Feliberto Harts MD   On: 06/22/2020 11:39   DG Swallowing Func-Speech Pathology  Result Date: 06/23/2020 Objective Swallowing Evaluation: Type of Study: MBS-Modified Barium Swallow Study  Patient Details Name: Gregory Wheeler MRN: [id number] Date of Birth: 1940-11-21 Today's Date: 06/23/2020 Time: SLP Start Time (ACUTE ONLY): 1417 -SLP Stop Time (ACUTE ONLY): 1437 SLP Time Calculation (min) (ACUTE ONLY): 20 min Past Medical History: Past Medical History: Diagnosis Date . Arthritis  . Atrial fibrillation (HCC) 01/31/2015 . Chronic gout  . Coronary artery disease 12/2007  STATUS POST STENTING OF THE LEFT ANTERIOR DESCENDING ARTERY . COVID-19  . Dyslipidemia  . Gout  . Hiatal hernia  . Hyperlipidemia  . Hypertension  . LBP (low back pain)  . Partial small bowel obstruction (HCC)  . RBBB (right bundle branch block)  . SBO (small bowel obstruction) (HCC)   PARTIAL . Second degree AV block, Mobitz type I  . Urinary tract infection   Escherichia coli urinary tract infection Past Surgical History: Past Surgical History: Procedure Laterality Date . CARDIAC CATHETERIZATION   . HERNIA REPAIR    INGUINAL . ROTATOR CUFF REPAIR   HPI: 79 yo male presenting to ED with generalized weakness, poor food intake, and confusion. COVID positive. PMH including hiatal hernia, arthritis, A-fib, HTN, AKI, alcohol abuse, and CAD  with s/p stenting of L anterior descending artery. CXR Progressive bibasilar airspace disease consistent with multifocal pneumonia.  No data recorded Assessment / Plan / Recommendation CHL IP CLINICAL IMPRESSIONS 06/23/2020 Clinical Impression Pt exhibits mild oropharyngeal dysphagia with aspiration of nectar consistency, and mild vallecular residual. 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 Pt unable to contain entire bolus in oral cavity and moderate amount nectar thick fell over tongue and was aspirated prior to initiation of laryngeal closure (frank volume) with immediate cough. Timing of closure delayed with thin using chin tuck and slight aspiraiton present. Lingual residue  present falling into valleculae post swallow. Chin down posture using straw was effective with nectar however pt displays memory deficit and follow through of strategy is questionable. 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 Recommend honey thick liquids, Dys 3, crush meds. full supervision/assist. SLP will work with pt on chin tuck with nectar for possible upgrade.  SLP Visit Diagnosis Dysphagia, oropharyngeal phase (R13.12) Attention and concentration deficit following -- Frontal lobe and executive function deficit following -- Impact on safety and function Mild aspiration risk;Moderate aspiration risk   CHL IP TREATMENT RECOMMENDATION 06/23/2020 Treatment Recommendations Therapy as outlined in treatment plan below   Prognosis 06/23/2020 Prognosis for Safe Diet Advancement (No Data) Barriers to Reach Goals Cognitive deficits Barriers/Prognosis Comment -- CHL IP DIET RECOMMENDATION 06/23/2020 SLP Diet Recommendations Dysphagia 3 (Mech soft) solids;Honey thick liquids Liquid Administration via Cup Medication Administration Crushed with puree Compensations Minimize environmental distractions;Slow rate;Small  sips/bites Postural Changes Seated upright at 90 degrees;Remain semi-upright after after feeds/meals (Comment)   CHL IP OTHER RECOMMENDATIONS 06/23/2020 Recommended Consults -- Oral Care Recommendations Oral care BID Other Recommendations --   CHL IP FOLLOW UP RECOMMENDATIONS 06/23/2020 Follow up Recommendations (No Data)   CHL IP FREQUENCY AND DURATION 06/23/2020 Speech Therapy Frequency (ACUTE ONLY) min 2x/week Treatment Duration 2 weeks      CHL IP ORAL PHASE 06/23/2020 Oral Phase Impaired Oral - Pudding Teaspoon -- Oral - Pudding Cup -- Oral - Honey Teaspoon -- Oral - Honey Cup Lingual/palatal residue;Piecemeal swallowing Oral - Nectar Teaspoon -- Oral - Nectar Cup Decreased bolus cohesion Oral - Nectar Straw Decreased bolus cohesion Oral - Thin Teaspoon -- Oral - Thin Cup -- Oral - Thin Straw -- Oral - Puree Piecemeal swallowing Oral - Mech Soft -- Oral - Regular -- Oral - Multi-Consistency -- Oral - Pill -- Oral Phase - Comment --  CHL IP PHARYNGEAL PHASE 06/23/2020 Pharyngeal Phase Impaired Pharyngeal- Pudding Teaspoon -- Pharyngeal -- Pharyngeal- Pudding Cup -- Pharyngeal -- Pharyngeal- Honey Teaspoon -- Pharyngeal -- Pharyngeal- Honey Cup Pharyngeal residue - valleculae Pharyngeal -- Pharyngeal- Nectar Teaspoon -- Pharyngeal -- Pharyngeal- Nectar Cup Penetration/Aspiration before swallow Pharyngeal Material enters airway, passes BELOW cords and not ejected out despite cough attempt by patient Pharyngeal- Nectar Straw Penetration/Aspiration during swallow Pharyngeal Material enters airway, remains ABOVE vocal cords then ejected out;Material does not enter airway Pharyngeal- Thin Teaspoon NT Pharyngeal -- Pharyngeal- Thin Cup NT Pharyngeal -- Pharyngeal- Thin Straw Penetration/Aspiration during swallow Pharyngeal Material enters airway, passes BELOW cords without attempt by patient to eject out (silent aspiration) Pharyngeal- Puree Pharyngeal residue - valleculae Pharyngeal -- Pharyngeal- Mechanical Soft --  Pharyngeal -- Pharyngeal- Regular WFL Pharyngeal -- Pharyngeal- Multi-consistency -- Pharyngeal -- Pharyngeal- Pill -- Pharyngeal -- Pharyngeal Comment --  CHL IP CERVICAL ESOPHAGEAL PHASE 06/23/2020 Cervical Esophageal Phase WFL Pudding Teaspoon -- Pudding Cup -- Honey Teaspoon -- Honey Cup -- Nectar Teaspoon -- Nectar Cup -- Nectar Straw -- Thin Teaspoon -- Thin Cup -- Thin Straw -- Puree -- Mechanical Soft -- Regular -- Multi-consistency -- Pill -- Cervical Esophageal Comment -- Royce Macadamia 06/23/2020, 4:04 PM Breck Coons Lonell Face.Ed Sports administrator Pager [phone] Office [phone]              ECHOCARDIOGRAM COMPLETE  Result Date: 06/23/2020    ECHOCARDIOGRAM REPORT   Patient Name:   Gregory Wheeler Date of Exam: 06/23/2020 Medical Rec #:  [id number]      Height:       71.0 in  Accession #:    [id number]     Weight:       244.7 lb Date of Birth:  11-26-1940      BSA:          2.297 m Patient Age:    79 years       BP:           170/76 mmHg Patient Gender: M              HR:           71 bpm. Exam Location:  Inpatient Procedure: 2D Echo, Cardiac Doppler and Color Doppler Indications:    R07.9* Chest pain, unspecified. Elevated troponin  History:        Patient has prior history of Echocardiogram examinations, most                 recent 02/24/2019. CAD, Abnormal ECG, Arrythmias:RBBB,                 Signs/Symptoms:Hypotension; Risk Factors:Dyslipidemia. Covid 19                 positive. ETOH.  Sonographer:    Sheralyn Boatman RDCS Referring Phys: [id number] ANASTASSIA DOUTOVA  Sonographer Comments: Patient in Fowlaer's position. IMPRESSIONS  1. Left ventricular ejection fraction, by estimation, is 60 to 65%. The left ventricle has normal function. The left ventricle has no regional wall motion abnormalities. There is mild left ventricular hypertrophy. Left ventricular diastolic parameters are consistent with Grade I diastolic dysfunction (impaired relaxation).  2. Right ventricular systolic  function is moderately reduced. The right ventricular size is normal. There is normal pulmonary artery systolic pressure. The estimated right ventricular systolic pressure is 33.2 mmHg. Abnormal RV, would consider PE.  3. Right atrial size was mildly dilated.  4. The mitral valve is normal in structure. No evidence of mitral valve regurgitation. No evidence of mitral stenosis.  5. The aortic valve is tricuspid. Aortic valve regurgitation is trivial. Mild aortic valve sclerosis is present, with no evidence of aortic valve stenosis.  6. The inferior vena cava is normal in size with greater than 50% respiratory variability, suggesting right atrial pressure of 3 mmHg. FINDINGS  Left Ventricle: Left ventricular ejection fraction, by estimation, is 60 to 65%. The left ventricle has normal function. The left ventricle has no regional wall motion abnormalities. The left ventricular internal cavity size was normal in size. There is  mild left ventricular hypertrophy. Left ventricular diastolic parameters are consistent with Grade I diastolic dysfunction (impaired relaxation). Right Ventricle: The right ventricular size is normal. No increase in right ventricular wall thickness. Right ventricular systolic function is moderately reduced. There is normal pulmonary artery systolic pressure. The tricuspid regurgitant velocity is 2.75 m/s, and with an assumed right atrial pressure of 3 mmHg, the estimated right ventricular systolic pressure is 33.2 mmHg. Left Atrium: Left atrial size was normal in size. Right Atrium: Right atrial size was mildly dilated. Pericardium: There is no evidence of pericardial effusion. Mitral Valve: The mitral valve is normal in structure. No evidence of mitral valve regurgitation. No evidence of mitral valve stenosis. Tricuspid Valve: The tricuspid valve is normal in structure. Tricuspid valve regurgitation is trivial. Aortic Valve: The aortic valve is tricuspid. Aortic valve regurgitation is trivial.  Mild aortic valve sclerosis is present, with no evidence of aortic valve stenosis. Pulmonic Valve: The pulmonic valve was normal in structure. Pulmonic valve regurgitation is not visualized. Aorta: The aortic root is normal in size and  structure. Venous: The inferior vena cava is normal in size with greater than 50% respiratory variability, suggesting right atrial pressure of 3 mmHg. IAS/Shunts: No atrial level shunt detected by color flow Doppler.  LEFT VENTRICLE PLAX 2D LVIDd:         3.80 cm     Diastology LVIDs:         2.60 cm     LV e' medial:    4.35 cm/s LV PW:         1.70 cm     LV E/e' medial:  16.9 LV IVS:        1.50 cm     LV e' lateral:   5.33 cm/s LVOT diam:     2.30 cm     LV E/e' lateral: 13.8 LV SV:         97 LV SV Index:   42 LVOT Area:     4.15 cm  LV Volumes (MOD) LV vol d, MOD A2C: 78.7 ml LV vol d, MOD A4C: 84.2 ml LV vol s, MOD A2C: 17.5 ml LV vol s, MOD A4C: 25.2 ml LV SV MOD A2C:     61.2 ml LV SV MOD A4C:     84.2 ml LV SV MOD BP:      64.3 ml RIGHT VENTRICLE             IVC RV S prime:     18.50 cm/s  IVC diam: 2.20 cm TAPSE (M-mode): 3.2 cm LEFT ATRIUM             Index       RIGHT ATRIUM           Index LA diam:        3.70 cm 1.61 cm/m  RA Area:     18.30 cm LA Vol (A2C):   34.1 ml 14.84 ml/m RA Volume:   53.10 ml  23.11 ml/m LA Vol (A4C):   49.7 ml 21.63 ml/m LA Biplane Vol: 43.7 ml 19.02 ml/m  AORTIC VALVE LVOT Vmax:   124.00 cm/s LVOT Vmean:  89.800 cm/s LVOT VTI:    0.233 m  AORTA Ao Root diam: 3.50 cm MITRAL VALVE               TRICUSPID VALVE MV Area (PHT): 3.21 cm    TR Peak grad:   30.2 mmHg MV Decel Time: 236 msec    TR Vmax:        275.00 cm/s MV E velocity: 73.30 cm/s MV A velocity: 99.05 cm/s  SHUNTS MV E/A ratio:  0.74        Systemic VTI:  0.23 m                            Systemic Diam: 2.30 cm Marca Ancona MD Electronically signed by Marca Ancona MD Signature Date/Time: 06/23/2020/4:53:51 PM    Final    [place] EKG SITE RITE  Result Date: 06/25/2020 If Site  Rite image not attached, placement could not be confirmed due to current cardiac rhythm.

## 2020-06-27 NOTE — Progress Notes (Signed)
PHARMACY CONSULT NOTE FOR:  OUTPATIENT  PARENTERAL ANTIBIOTIC THERAPY (OPAT)  Indication: Endocarditis  Regimen: Ceftriaxone 2g IV q12h, Ampicillin 12g daily as a continuous infusion End date: 08/04/2020   IV antibiotic discharge orders are pended. To discharging provider:  please sign these orders via discharge navigator,  Select New Orders & click on the button choice - Manage This Unsigned Work.     Thank you for allowing pharmacy to be a part of this patient's care.  Jeannetta Nap 06/27/2020, 12:17 PM

## 2020-06-27 NOTE — Progress Notes (Signed)
Patient has been confused, anxious, restless. Tylenol given PO to see if that would help. Son states that patient takes Tylenol daily at home. Patient cooperative with assessment and taking meds crushed in pudding. Lesa, NT in room with patient. NT states that patient was more cooperative before RN in room. NT having to repeatedly keep patient from pulling at foley. Patient in bed at this time with lights off for calming effect. Per NT, patient is in and out of sleep with intermittent restlessness. Will continue to monitor.

## 2020-06-28 ENCOUNTER — Inpatient Hospital Stay (HOSPITAL_COMMUNITY): Payer: Medicare Other

## 2020-06-28 DIAGNOSIS — K121 Other forms of stomatitis: Secondary | ICD-10-CM

## 2020-06-28 DIAGNOSIS — U071 COVID-19: Secondary | ICD-10-CM

## 2020-06-28 DIAGNOSIS — J1282 Pneumonia due to coronavirus disease 2019: Secondary | ICD-10-CM | POA: Diagnosis not present

## 2020-06-28 DIAGNOSIS — E872 Acidosis: Secondary | ICD-10-CM | POA: Diagnosis not present

## 2020-06-28 DIAGNOSIS — G9340 Encephalopathy, unspecified: Secondary | ICD-10-CM | POA: Diagnosis not present

## 2020-06-28 DIAGNOSIS — E876 Hypokalemia: Secondary | ICD-10-CM | POA: Diagnosis not present

## 2020-06-28 LAB — CBC WITH DIFFERENTIAL/PLATELET
Abs Immature Granulocytes: 0.2 10*3/uL — ABNORMAL HIGH (ref 0.00–0.07)
Abs Immature Granulocytes: 0.2 10*3/uL — ABNORMAL HIGH (ref 0.00–0.07)
Abs Immature Granulocytes: 0.16 10*3/uL — ABNORMAL HIGH (ref 0.00–0.07)
Basophils Absolute: 0 10*3/uL (ref 0.0–0.1)
Basophils Absolute: 0 10*3/uL (ref 0.0–0.1)
Basophils Absolute: 0 10*3/uL (ref 0.0–0.1)
Basophils Relative: 0 %
Basophils Relative: 0 %
Basophils Relative: 0 %
Eosinophils Absolute: 0 10*3/uL (ref 0.0–0.5)
Eosinophils Absolute: 0.1 10*3/uL (ref 0.0–0.5)
Eosinophils Absolute: 0.1 10*3/uL (ref 0.0–0.5)
Eosinophils Relative: 0 %
Eosinophils Relative: 1 %
Eosinophils Relative: 1 %
HCT: 31 % — ABNORMAL LOW (ref 39.0–52.0)
HCT: 29.1 % — ABNORMAL LOW (ref 39.0–52.0)
HCT: 26.3 % — ABNORMAL LOW (ref 39.0–52.0)
Hemoglobin: 9.8 g/dL — ABNORMAL LOW (ref 13.0–17.0)
Hemoglobin: 9.3 g/dL — ABNORMAL LOW (ref 13.0–17.0)
Hemoglobin: 8.5 g/dL — ABNORMAL LOW (ref 13.0–17.0)
Immature Granulocytes: 2 %
Immature Granulocytes: 2 %
Immature Granulocytes: 1 %
Lymphocytes Relative: 8 %
Lymphocytes Relative: 7 %
Lymphocytes Relative: 6 %
Lymphs Abs: 1 10*3/uL (ref 0.7–4.0)
Lymphs Abs: 0.7 10*3/uL (ref 0.7–4.0)
Lymphs Abs: 0.7 10*3/uL (ref 0.7–4.0)
MCH: 33.6 pg (ref 26.0–34.0)
MCH: 33.9 pg (ref 26.0–34.0)
MCH: 35 pg — ABNORMAL HIGH (ref 26.0–34.0)
MCHC: 31.6 g/dL (ref 30.0–36.0)
MCHC: 32 g/dL (ref 30.0–36.0)
MCHC: 32.3 g/dL (ref 30.0–36.0)
MCV: 106.2 fL — ABNORMAL HIGH (ref 80.0–100.0)
MCV: 106.2 fL — ABNORMAL HIGH (ref 80.0–100.0)
MCV: 108.2 fL — ABNORMAL HIGH (ref 80.0–100.0)
Monocytes Absolute: 0.9 10*3/uL (ref 0.1–1.0)
Monocytes Absolute: 0.8 10*3/uL (ref 0.1–1.0)
Monocytes Absolute: 0.9 10*3/uL (ref 0.1–1.0)
Monocytes Relative: 7 %
Monocytes Relative: 7 %
Monocytes Relative: 8 %
Neutro Abs: 10.8 10*3/uL — ABNORMAL HIGH (ref 1.7–7.7)
Neutro Abs: 9 10*3/uL — ABNORMAL HIGH (ref 1.7–7.7)
Neutro Abs: 9.7 10*3/uL — ABNORMAL HIGH (ref 1.7–7.7)
Neutrophils Relative %: 83 %
Neutrophils Relative %: 83 %
Neutrophils Relative %: 84 %
Platelets: 253 10*3/uL (ref 150–400)
Platelets: 221 10*3/uL (ref 150–400)
Platelets: 222 10*3/uL (ref 150–400)
RBC: 2.92 MIL/uL — ABNORMAL LOW (ref 4.22–5.81)
RBC: 2.74 MIL/uL — ABNORMAL LOW (ref 4.22–5.81)
RBC: 2.43 MIL/uL — ABNORMAL LOW (ref 4.22–5.81)
RDW: 14.3 % (ref 11.5–15.5)
RDW: 14.4 % (ref 11.5–15.5)
RDW: 14.3 % (ref 11.5–15.5)
WBC: 12.8 10*3/uL — ABNORMAL HIGH (ref 4.0–10.5)
WBC: 10.7 10*3/uL — ABNORMAL HIGH (ref 4.0–10.5)
WBC: 11.5 10*3/uL — ABNORMAL HIGH (ref 4.0–10.5)
nRBC: 0 % (ref 0.0–0.2)
nRBC: 0 % (ref 0.0–0.2)
nRBC: 0 % (ref 0.0–0.2)

## 2020-06-28 LAB — COMPREHENSIVE METABOLIC PANEL
ALT: 75 U/L — ABNORMAL HIGH (ref 0–44)
AST: 57 U/L — ABNORMAL HIGH (ref 15–41)
Albumin: 2.3 g/dL — ABNORMAL LOW (ref 3.5–5.0)
Alkaline Phosphatase: 53 U/L (ref 38–126)
Anion gap: 11 (ref 5–15)
BUN: 18 mg/dL (ref 8–23)
CO2: 26 mmol/L (ref 22–32)
Calcium: 9 mg/dL (ref 8.9–10.3)
Chloride: 107 mmol/L (ref 98–111)
Creatinine, Ser: 1.12 mg/dL (ref 0.61–1.24)
GFR, Estimated: >60 mL/min (ref >60)
Glucose, Bld: 94 mg/dL (ref 70–99)
Potassium: 4.1 mmol/L (ref 3.5–5.1)
Sodium: 144 mmol/L (ref 135–145)
Total Bilirubin: 0.8 mg/dL (ref 0.3–1.2)
Total Protein: 5.1 g/dL — ABNORMAL LOW (ref 6.5–8.1)

## 2020-06-28 LAB — BASIC METABOLIC PANEL
Anion gap: 8 (ref 5–15)
BUN: 16 mg/dL (ref 8–23)
CO2: 27 mmol/L (ref 22–32)
Calcium: 8.5 mg/dL — ABNORMAL LOW (ref 8.9–10.3)
Chloride: 107 mmol/L (ref 98–111)
Creatinine, Ser: 1.14 mg/dL (ref 0.61–1.24)
GFR, Estimated: >60 mL/min (ref >60)
Glucose, Bld: 89 mg/dL (ref 70–99)
Potassium: 3.9 mmol/L (ref 3.5–5.1)
Sodium: 142 mmol/L (ref 135–145)

## 2020-06-28 LAB — ABO/RH: ABO/RH(D): AB POS

## 2020-06-28 LAB — PROCALCITONIN: Procalcitonin: <0.1 ng/mL

## 2020-06-28 LAB — D-DIMER, QUANTITATIVE: D-Dimer, Quant: 0.84 ug/mL-FEU — ABNORMAL HIGH (ref 0.00–0.50)

## 2020-06-28 LAB — C-REACTIVE PROTEIN: CRP: 0.6 mg/dL (ref <1.0)

## 2020-06-28 LAB — MAGNESIUM: Magnesium: 1.8 mg/dL (ref 1.7–2.4)

## 2020-06-28 LAB — BRAIN NATRIURETIC PEPTIDE: B Natriuretic Peptide: 1176.3 pg/mL — ABNORMAL HIGH (ref 0.0–100.0)

## 2020-06-28 MED ORDER — DEXTROSE 5 % IV SOLN
900.0000 mg | Freq: Three times a day (TID) | INTRAVENOUS | Status: DC
  Administered 2020-06-28: 900 mg via INTRAVENOUS
  Filled 2020-06-28 (×3): qty 18

## 2020-06-28 MED ORDER — FLUCONAZOLE 100MG IVPB
100.0000 mg | INTRAVENOUS | Status: DC
  Administered 2020-06-28 – 2020-06-30 (×3): 100 mg via INTRAVENOUS
  Filled 2020-06-28 (×6): qty 50

## 2020-06-28 MED ORDER — PANTOPRAZOLE SODIUM 40 MG IV SOLR: 40.0000 mg | Freq: Two times a day (BID) | INTRAVENOUS | Status: DC

## 2020-06-28 MED ORDER — MAGNESIUM SULFATE 2 GM/50ML IV SOLN
2.0000 g | Freq: Once | INTRAVENOUS | Status: AC
  Administered 2020-06-28: 2 g via INTRAVENOUS
  Filled 2020-06-28: qty 50

## 2020-06-28 MED ORDER — VANCOMYCIN HCL 1500 MG/300ML IV SOLN
1500.0000 mg | Freq: Once | INTRAVENOUS | Status: AC
  Administered 2020-06-28: 1500 mg via INTRAVENOUS
  Filled 2020-06-28: qty 300

## 2020-06-28 MED ORDER — LORAZEPAM 2 MG/ML IJ SOLN
4.0000 mg | Freq: Once | INTRAMUSCULAR | Status: AC
  Administered 2020-06-28: 4 mg via INTRAVENOUS
  Filled 2020-06-28: qty 2

## 2020-06-28 MED ORDER — LORAZEPAM 2 MG/ML IJ SOLN
2.0000 mg | Freq: Four times a day (QID) | INTRAMUSCULAR | Status: DC | PRN
  Administered 2020-06-28: 2 mg via INTRAVENOUS
  Filled 2020-06-28: qty 1

## 2020-06-28 MED ORDER — MORPHINE SULFATE (PF) 2 MG/ML IV SOLN
2.0000 mg | Freq: Once | INTRAVENOUS | Status: AC
  Administered 2020-06-28: 2 mg via INTRAVENOUS
  Filled 2020-06-28: qty 1

## 2020-06-28 MED ORDER — SODIUM CHLORIDE 0.9 % IV SOLN
8.0000 mg/h | INTRAVENOUS | Status: DC
  Administered 2020-06-28 (×2): 8 mg/h via INTRAVENOUS
  Filled 2020-06-28 (×2): qty 80

## 2020-06-28 MED ORDER — POTASSIUM CHLORIDE 2 MEQ/ML IV SOLN
INTRAVENOUS | Status: DC
  Filled 2020-06-28: qty 1000

## 2020-06-28 MED ORDER — LORAZEPAM 2 MG/ML IJ SOLN
2.0000 mg | Freq: Once | INTRAMUSCULAR | Status: AC
  Administered 2020-06-28: 2 mg via INTRAVENOUS
  Filled 2020-06-28: qty 1

## 2020-06-28 MED ORDER — PANTOPRAZOLE SODIUM 40 MG IV SOLR: 40.0000 mg | Freq: Once | INTRAVENOUS | Status: DC

## 2020-06-28 MED ORDER — CLONIDINE HCL 0.2 MG/24HR TD PTWK
0.2000 mg | MEDICATED_PATCH | TRANSDERMAL | Status: DC
  Filled 2020-06-28: qty 1

## 2020-06-28 MED ORDER — SODIUM CHLORIDE 0.9 % IV SOLN
80.0000 mg | Freq: Once | INTRAVENOUS | Status: AC
  Administered 2020-06-28: 80 mg via INTRAVENOUS
  Filled 2020-06-28: qty 80

## 2020-06-28 MED ORDER — DEXTROSE 5 % IV SOLN
110.0000 mg | Freq: Three times a day (TID) | INTRAVENOUS | Status: DC
  Filled 2020-06-28 (×3): qty 2.2

## 2020-06-28 MED ORDER — MORPHINE SULFATE (PF) 2 MG/ML IV SOLN
2.0000 mg | INTRAVENOUS | Status: DC | PRN
  Administered 2020-06-28: 2 mg via INTRAVENOUS
  Filled 2020-06-28: qty 1

## 2020-06-28 MED ORDER — SODIUM CHLORIDE 0.9 % IV SOLN
3.0000 g | Freq: Three times a day (TID) | INTRAVENOUS | Status: DC
  Administered 2020-06-28 – 2020-06-29 (×2): 3 g via INTRAVENOUS
  Filled 2020-06-28 (×2): qty 8
  Filled 2020-06-28: qty 3
  Filled 2020-06-28: qty 8

## 2020-06-28 MED ORDER — MORPHINE SULFATE (PF) 2 MG/ML IV SOLN
1.0000 mg | INTRAVENOUS | Status: DC | PRN
  Administered 2020-06-28: 1 mg via INTRAVENOUS
  Filled 2020-06-28: qty 1

## 2020-06-28 MED ORDER — VANCOMYCIN HCL 750 MG/150ML IV SOLN
750.0000 mg | Freq: Two times a day (BID) | INTRAVENOUS | Status: DC
  Filled 2020-06-28: qty 150

## 2020-06-28 NOTE — Progress Notes (Signed)
Pharmacy Antibiotic Note  Gregory Wheeler is a 79 y.o. male admitted on 06/22/2020 with COVID and enterococcal bacteremia now with ? Reaction to ampicillin/Rocephin and pharyngitis.  Switching antibiotics to Vancomycin and adding acyclovir for possible Herpes  Plan: Vancomycin 1500 mg iv x 1 then 750 mg iv Q 12  Acyclovir 900 mg iv Q 8 hours   Height: 5\' 11"  (180.3 cm) Weight: 111 kg (244 lb 11.4 oz) IBW/kg (Calculated) : 75.3  Temp (24hrs), Avg:98.5 F (36.9 C), Min:97.5 F (36.4 C), Max:99.1 F (37.3 C)  Recent Labs  Lab 06/22/20 1143 06/22/20 1405 06/22/20 2236 06/24/20 0650 06/24/20 0650 06/25/20 0133 06/26/20 0320 06/27/20 0420 06/28/20 0820 06/28/20 1125  WBC 17.3*  --    < > 11.0*   < > 10.8* 9.9 10.2 12.8* 10.7*  CREATININE 1.34*  --    < > 1.07  --  1.05 0.93 0.99 1.12  --   LATICACIDVEN 2.4* 1.7  --   --   --   --   --   --   --   --    < > = values in this interval not displayed.    Estimated Creatinine Clearance: 68.9 mL/min (by C-G formula based on SCr of 1.12 mg/dL).    Allergies  Allergen Reactions   Lorazepam Other (See Comments)    Extreme delirium     Microbiology results: 10/27 BCx: 4/4 GPC, BCID vanc sensitive enterococcus faecalis 10/28 MRSA PCR: neg 10//29 blood>>NG  Thank you 08-10-1985, PharmD 06/28/2020 2:44 PM

## 2020-06-28 NOTE — Progress Notes (Signed)
SLP Cancellation Note  Patient Details Name: Gregory Wheeler MRN: [id number] DOB: 1941-02-05   Cancelled treatment:        Notes read and RN reported but has been yelling moaning and not following commands. MD notes state he has not been eating- was made NPO this am. Will follow up for status/plan.   Royce Macadamia 06/28/2020, 2:27 PM   Breck Coons Lonell Face.Ed Nurse, children's [phone] Office [phone]

## 2020-06-28 NOTE — Progress Notes (Signed)
   06/27/20 2304  Provider Notification  Provider Name/Title B. Chotiner, MD  Date Provider Notified 06/27/20  Time Provider Notified 2304  Notification Type Page  Notification Reason Other (Comment) (CIWA 28, pt confused and moaning uncontrollably)  Response See new orders  Date of Provider Response 06/27/20  Time of Provider Response 2304   Updated MD that pt's CIWA is 28 and is uncontrollably moaning. Notifed MD that IV haldol was given and rectal tylenol given with no improvement. MD also notified that there has been no ammonia level drawn. Orders for IV ativan placed. Ativan given, however not much improvement. Pt continues to be distraught and CIWA is 23. Will update provider and cont to monitor.

## 2020-06-28 NOTE — Progress Notes (Signed)
OT Cancellation Note  Patient Details Name: Gregory Wheeler MRN: [id number] DOB: November 17, 1940   Cancelled Treatment:    Reason Eval/Treat Not Completed: Other (comment) (Increased confusion and restlessness. Moaning. Will return as schedule allows.)  [name] Keny Donald Adaley Kiene MSOT, OTR/L Acute Rehab Pager: [phone] Office: [phone]  06/28/2020, 3:40 PM

## 2020-06-28 NOTE — Significant Event (Addendum)
Rapid Response Event Note   Reason for Call :  Asked to see pt as second set of eyes. Pt here with COVID with PNA.  He developed a GIB this morning for which GI has been consulted. Protonix gtt started @ 1008 this morning per GI MD order. Lovenox has been d/c'd. Pt had another episode of bleeding late this afternoon. A Nuc med blood loss study has been ordered.  CBC's are ordered q6h x 3. Pt is also withdrawing from ETOH of which he is getting ativan IV.  Initial Focused Assessment:  Pt laying in bed with eyes closed, moaning, agitated. He is moving all extremities equally but will not speak(besides a moan) or follow commands. Pupils 2, equal, and brisk. Lungs diminished t/o. Pt is using his abdominal muscles to breath but does not appear to be in any respiratory distress. His ABD is large, soft. Pt had moderate BRBPR with clots on my assessment. He is continuously trickling BRB out of rectum. Staff have been unable to obtain accurate BP readings d/t pt's agitation. I was able to doppler one on his R F/A-110/70, HR-60-80 SR (this was in the 100s prior to my arrival but RN gave pt's PRN dose of 5mg  metoprolol), T-98.7, RR-22, SpO2-97-99% on NRB (pt is mouthbreathing).    Interventions:  CBC drawn from PICC line-Hgb drop 9.5>8.3 in 8 hours.  Manual BP done: 100/70 NM blood loss study(already ordered) if pt able to tolerate  Plan of Care:  Please notify MD of drop in hgb. Continue serial CBCs and monitor bleeding. Continue protonix gtt per GI. Nuc med blood loss study if pt will tolerate(I do not think he will be able to hold still enough at this time to do this test). Please take manual BPs if monitor unable to obtain BP accurately. Continue to monitor pt closely. Please call RRT if further assistance needed.    Event Summary:   MD Notified: RN to notify MD.  Call [phone] Arrival Time:2010 End Time:2100  IBBC:[id number], RN

## 2020-06-28 NOTE — Progress Notes (Signed)
   06/28/20 0909  Assess: MEWS Score  Temp 98.2 F (36.8 C)  BP (!) 227/178  Pulse Rate (!) 102  ECG Heart Rate (!) 103  Resp (!) 22  SpO2 92 %  O2 Device Nasal Cannula  Assess: MEWS Score  MEWS Temp 0  MEWS Systolic 2  MEWS Pulse 1  MEWS RR 1  MEWS LOC 0  MEWS Score 4  MEWS Score Color Red  Assess: if the MEWS score is Yellow or Red  Were vital signs taken at a resting state? Yes  Focused Assessment Change from prior assessment (see assessment flowsheet) (blood noted from rectum. Dr. Thedore Mins notified. he is putting i)  Early Detection of Sepsis Score *See Row Information* High  MEWS guidelines implemented *See Row Information* Yes  Treat  MEWS Interventions Administered scheduled meds/treatments;Administered prn meds/treatments;Escalated (See documentation below)  Pain Scale PAINAD  Faces Pain Scale 4  Pain Type Other (Comment)  Breathing 1  Negative Vocalization 2  Facial Expression 1  Body Language 2  Consolability 2  PAINAD Score 8  Interventions Other (comment);Cold pack  Take Vital Signs  Increase Vital Sign Frequency  Red: Q 1hr X 4 then Q 4hr X 4, if remains red, continue Q 4hrs  Escalate  MEWS: Escalate Red: discuss with charge nurse/RN and provider, consider discussing with RRT  Notify: Charge Nurse/RN  Name of Charge Nurse/RN Notified Erick Alley  Date Charge Nurse/RN Notified 06/28/20  Time Charge Nurse/RN Notified 3570  Notify: Provider  Provider Name/Title Prashant k. Thedore Mins  Date Provider Notified 06/28/20  Time Provider Notified 0911  Notification Type Face-to-face  Notification Reason Change in status  Response See new orders  Date of Provider Response 06/28/20  Time of Provider Response 0920  Document  Patient Outcome Not stable and remains on department  Progress note created (see row info) Yes

## 2020-06-28 NOTE — Progress Notes (Signed)
PT Cancellation Note  Patient Details Name: Gregory Wheeler MRN: [id number] DOB: March 29, 1941   Cancelled Treatment:    Reason Eval/Treat Not Completed: Medical issues which prohibited therapy  Continued increased confusion.    Jerolyn Center, PT Pager [phone]  Zena Amos 06/28/2020, 2:22 PM

## 2020-06-28 NOTE — Consult Note (Signed)
Reason for Consult: URGENT Consult Referring Physician: Alwaleed Obeso is an 79 y.o. male.  HPI: Patient was admitted to the hospital for management of COVID pneumonia approximately 10 days ago.  I was called this morning and asked to see the patient urgently regarding oral cavity findings suggestive of stomatitis.  This was noticed yesterday when patient's dentures were removed.  During my conversation with Dr. Thedore Mins, I asked him whether he thought this may represent Trudie Buckler syndrome.  This is not something I typically see or treat.  He said this is not at all consistent with that diagnosis.  His primary concern was acute herpetic infection.  Infectious disease and oral medicine are not available.  I was able to come evaluate the patient within 20 minutes of the consult request.  I examined the patient with the help of 2 nurses and a sitter were all in the room.  They nicely warned me about the patient's tendency to bite anytime someone gets near his mouth.  Past Medical History:  Diagnosis Date  . Arthritis   . Atrial fibrillation (HCC) 01/31/2015  . Chronic gout   . Coronary artery disease 12/2007   STATUS POST STENTING OF THE LEFT ANTERIOR DESCENDING ARTERY  . COVID-19   . Dyslipidemia   . Gout   . Hiatal hernia   . Hyperlipidemia   . Hypertension   . LBP (low back pain)   . Partial small bowel obstruction (HCC)   . RBBB (right bundle branch block)   . SBO (small bowel obstruction) (HCC)    PARTIAL  . Second degree AV block, Mobitz type I   . Urinary tract infection    Escherichia coli urinary tract infection    Past Surgical History:  Procedure Laterality Date  . CARDIAC CATHETERIZATION    . HERNIA REPAIR     INGUINAL  . ROTATOR CUFF REPAIR      Family History  Problem Relation Age of Onset  . Emphysema Mother 109  . Heart attack Father 45    Social History:  reports that he quit smoking about 29 years ago. He has never used smokeless tobacco. He reports  current alcohol use. He reports that he does not use drugs.  Allergies:  Allergies  Allergen Reactions  . Lorazepam Other (See Comments)    Extreme delirium     Medications: I have reviewed the patient's current medications.   Review of Systems  Cannot obtain Blood pressure (!) 142/87, pulse 90, temperature 98.2 F (36.8 C), temperature source Axillary, resp. rate (!) 22, height 5\' 11"  (1.803 m), weight 111 kg, SpO2 92 %. Physical Exam Patient is confused, combative, and restrained.  He repeatedly attempts to bite my hands at any point I approaches face to examine him.  This obviously limited my ability to complete a full ENT examination.  The primary concern centered around his oral cavity and so we spent our time focused in that area.  The mucosa of the oral cavity was diffusely involved with an erythematous process without focal ulcerations.  There were dried mucus plaques covering the mucosa.  The lesions/condition of the mucosa was diffuse and there was no pushing border as one might see with an invasive fungal infection nor were there punctate ulcerations suggestive of a viral process.  The process does not seem to involve the corners of the mouth as is often seen with candidal infection.  I will either call me Amina Ahmed, looking  I was able to  examine his nasal mucosa and saw no evidence of similar lesions or involvement of the nasal mucosa.  Specifically, there was no evidence of fungal disease.   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  That is typically caused by combination of a fungal infection and bacterial overgrowth.  I do not see any evidence of frank  necrosis to suggest invasive fungal infection.  If a pushing border of necrotic tissue were seen, biopsy would be appropriate.  This would require a trip to the operating room for a general anesthetic, something that I do not think is warranted at this time.  I cannot perform an oral cavity biopsy at the bedside safely and would not recommend a general anesthetic at this point.  The most ideal practitioner to see this patient is an oral medicine physician.  In the absence of an oral medicine physician here at the hospital, I would recommend that Dr. Thedore Mins consider the following:  1.  Keep the dentures out of his mouth  2.  Consider an antifungal course of therapy  3.  I do not know if this represents an acute viral process such as herpetic stomatitis.  In my experience, this does not look like that diagnosis but I would defer to my infectious disease colleagues.  Thus, I would recommend an infectious disease consultation.  4.  If possible, an antibacterial rinse such as Peridex could be added to the antifungal regimen.  I am not sure this patient is going to cooperate with that therapy.  5.  Please call me immediately if this problem develops into a more necrotic process possibly indicative of an invasive fungal infection.  It does not have any characteristics suggestive of that diagnosis on my exam today.    Rejeana Brock 06/28/2020, 3:41 PM

## 2020-06-28 NOTE — Progress Notes (Addendum)
PROGRESS NOTE                                                                                                                                                                                                             Patient Demographics:    Gregory Wheeler, is a 79 y.o. male, DOB - 10-Nov-1940, ZOX:[id number]  Outpatient Primary MD for the patient is Jacqualine Mau, NP    LOS - 6  Admit date - 06/22/2020    Chief Complaint  Patient presents with  . Covid pos  and Not eating or drinking       Brief Narrative (HPI from H&P)  - Gregory Wheeler is a 79 y.o. male with medical history significant of Spondylosis, gout, GERD, HTn, p A.fib, iron defficiency anemia, heavy alcohol abuse, CAD, HLD, right bundle branch block, history of small bowel obstruction, history of second-degree AV block Mobitz type I who has been sick for around 2 to 3 weeks before seeking help presented to the ER with shortness of breath and weakness.  Was diagnosed with COVID-19 pneumonia along with possible aspiration pneumonia, also had evidence of severe dehydration and AKI and was admitted to the hospital.  In the Hospital he has gone into severe DTs along with now melanotic stools on 06/28/2020. Continues to have severe encephalopathy due to DTs.  Also developing oral mucositis which most likely is due to his soft denture plate being stuck to the soft palate for several days however am switching antibiotics to vancomycin, IV acyclovir to cover for any possibility of herpes pharyngitis.  ENT will be consulted for the same.   Subjective:   In bed he is somnolent, unable to answer any questions or follow any commands, extremely agitated.   Assessment  & Plan :     1. Acute Hypoxic Resp. Failure due to Acute Covid 19 Viral Pneumonitis during the ongoing 2020 Covid 19 Pandemic -there is also evidence of possible overlying aspiration pneumonia causing bacterial  infection.  He is vaccinated and likely has breakthrough infection, he has finished his steroid and Remdesivir course, speech therapy following, remains very high risk for aspiration due to his encephalopathy, on Unasyn as well for the same.  Overall quite tenuous due to his advanced age and multiple other medical issues.  Encouraged the patient to sit up in chair  in the daytime use I-S and flutter valve for pulmonary toiletry and then prone in bed when at night.  Will advance activity and titrate down oxygen as possible.    SpO2: 99 % O2 Flow Rate (L/min): 3 L/min  Recent Labs  Lab 06/22/20 1143 06/22/20 1143 06/22/20 1144 06/22/20 1405 06/23/20 0140 06/23/20 0626 06/24/20 0650 06/25/20 0133 06/26/20 0320 06/27/20 0420  WBC 17.3*   < >  --   --  6.2  --  11.0* 10.8* 9.9 10.2  HGB 13.2   < >  --   --  10.3*  --  11.6* 10.3* 9.5* 9.7*  HCT 39.5   < >  --   --  31.5*  --  35.3* 30.6* 29.3* 29.9*  PLT 283   < >  --   --  231  --  264 277 240 247  CRP  --    < >   < >  --  9.0*  --  3.7* 1.7* 0.9 0.7  BNP  --   --   --   --  278.3*  --  366.6* 328.9* 489.1* 834.1*  DDIMER 0.67*   < >  --   --  0.39  --  0.51* 0.47 0.59* 0.61*  PROCALCITON 0.86   < >  --   --   --  0.52 0.30 0.19 <0.10 <0.10  AST 31   < >  --   --  31  --  39 62* 77* 76*  ALT 20   < >  --   --  21  --  27 41 67* 82*  ALKPHOS 55   < >  --   --  48  --  49 47 46 46  BILITOT 1.5*   < >  --   --  1.3*  --  0.8 0.6 0.5 0.8  ALBUMIN 2.5*   < >  --   --  1.7*  --  2.1* 2.0* 2.0* 2.2*  LATICACIDVEN 2.4*  --   --  1.7  --   --   --   --   --   --   SARSCOV2NAA POSITIVE*  --   --   --   --   --   --   --   --   --    < > = values in this interval not displayed.     Aspiration pneumonia-coughing with eating.  Along with metabolic encephalopathy, high risk for aspiration, dysphagia 3 diet speech to evaluate. Unasyn.  Enterococcus bacteremia.  ID following.  No clear source, poor candidate for TEE due to COVID-19.  Per ID  prolonged IV antibiotics for now.   Currently on Rocephin Unasyn finally should be on Rocephin/ampicillin, placed PICC line on 06/27/2020, ID will specify stop date.  Severe metabolic encephalopathy.  Supportive care, minimize benzodiazepines and narcotics, now in full-blown DTs.  Will allow son to visit on 06/27/2020 to see if we can calm him down.  Enterococcus bacteremia.  ID following.  No clear source, poor candidate for TEE.  Per ID prolonged IV antibiotics for now.   Currently on Rocephin Unasyn finally should be on Rocephin/ampicillin >> Vanco on 06/28/20, will get PICC line. DW ID Dr Luciana Axe 06/28/20  Severe metabolic encephalopathy.  Supportive care, minimize benzodiazepines and narcotics, now in full-blown DTs.  Becoming extremely sedated, now at risk for aspiration, unable to take oral medications or cooperate in treatment and therapy, hitting at staff members, has  to be placed in restraints, extremely poor prognosis chances of aspiration very high, will try family bedside to reduce agitation.  Risks benefits explained to the family members and they are aware of it and eager to help, son has already gotten the infection and vaccine and is aware that he can get Covid infection by spending time with his family, fully aware of it assumes all responsibility.  Melena - 06/28/20 - Protonix IV drip, type scree, NPO, follow CBC,  [facility] called. Hold Lovenox.   6.55pm another bloody BM, check Tagged PRBC scan, trend CBC, GI informed by RN.   Soft palate mucositis with raw skin.  He had upper plate of his dentures stuck to this area for a while, likely skin is raw from that however he has some blood under his tongue as well, doubt this is Trudie Buckler syndrome, will like to rule out herpes, will request ENT to evaluate, will switch antibiotics from Rocephin and ampicillin to Vanco & trial of IV acyclovir, further per ENT, already on medical mouthwash and Vaseline to keep the area moist.  ENT notes  noted - likely denture stomatitis, IV Diflucan, added Unasyn to Vanco, DC Acylovir.   Debility will have PT OT evaluate prior to discharge other plan as per orders.  Family wants to take him home with home health PT and RN.  RBBB (right bundle branch block) - chronic stable   Hypertension - continue beta-blocker.   Coronary artery disease -chronic stable continue beta-blocker patient does not seem to be on a statin.   Chronic atrial fibrillation (HCC) -on beta-blocker and Lovenox instead of Xarelto due to DTs, Lovenox will be held on 06/28/2020 due to melena.   AKI (acute kidney injury) (HCC) - hydrate with IV fluids and Foley catheter for retention.  AKI improved.  Urinary retention - Flomax and Foley on 06/23/2020.  Unable to void.  Hypokalemia and hypomagnesemia with severe dehydration-  adding to the family members he was sick for at least 2 to 3 days before he came to the hospital, severely dehydrated, IV fluids and replace electrolytes.  Hypoalbuminemia - protein supplementation added.        Condition - Extremely Guarded COVID-19 pneumonia, severe DTs with likely ongoing aspiration, enterococcal bacteremia, now GI Bleed.  Family Communication  :   Argentina Ponder - [phone] - 06/23/20 clearly explained life-threatening condition with extremely grave prognosis, updated 06/24/20, 06/25/20, 06/27/2020. 06/28/2020, updated on GI bleed and poor prognosis.  Daughter [phone] on June 23, 2020 clearly explained life-threatening condition with extremely grave prognosis.   Code Status :  Full  Consults  :  GI  Procedures  :    TTE -   1. Left ventricular ejection fraction, by estimation, is 60 to 65%. The left ventricle has normal function. The left ventricle has no regional wall motion abnormalities. There is mild left ventricular hypertrophy. Left ventricular diastolic parameters are consistent with Grade I diastolic dysfunction (impaired relaxation).  2. Right  ventricular systolic function is moderately reduced. The right ventricular size is normal. There is normal pulmonary artery systolic pressure. The estimated right ventricular systolic pressure is 33.2 mmHg. Abnormal RV, would consider PE.  3. Right atrial size was mildly dilated.  4. The mitral valve is normal in structure. No evidence of mitral valve regurgitation. No evidence of mitral stenosis.  5. The aortic valve is tricuspid. Aortic valve regurgitation is trivial. Mild aortic valve sclerosis is present, with no evidence of aortic valve stenosis.  6. The inferior  vena cava is normal in size with greater than 50% respiratory variability, suggesting right atrial pressure of 3 mmHg.  CT -  1. No acute intracranial abnormality. 2. Age related atrophy and chronic small vessel ischemia. 3. Opacification of the lower right greater than left mastoid air cells likely mastoid effusion, progressed from 2017. Small fluid levels in the left and right side of the sphenoid sinus.  PUD Prophylaxis :   Disposition Plan  :    Status is: Inpatient  Not inpatient appropriate, will call [facility] team and downgrade to OBS.   Dispo: The patient is from: Home              Anticipated d/c is to: Home              Anticipated d/c date is: > 3 days              Patient currently is not medically stable to d/c.   DVT Prophylaxis  :  Xaralto, Lovenox, SCDs now.  Lab Results  Component Value Date   PLT 247 06/27/2020    Diet :  Diet Order            Diet NPO time specified Except for: Sips with Meds  Diet effective now                  Inpatient Medications  Scheduled Meds: . allopurinol  300 mg Oral QHS  . amLODipine  10 mg Oral Daily  . chlorhexidine  15 mL Mouth Rinse BID  . Chlorhexidine Gluconate Cloth  6 each Topical Daily  . enoxaparin (LOVENOX) injection  110 mg Subcutaneous Q12H  . folic acid  1 mg Oral Daily  . lactose free nutrition  237 mL Oral TID WC  . mouth rinse  15 mL Mouth Rinse  [id number]  . metoprolol tartrate  25 mg Oral BID  . multivitamin with minerals  1 tablet Oral Daily  . nitroGLYCERIN  0.5 inch Topical Q6H  . [START ON 07/01/2020] pantoprazole  40 mg Intravenous Q12H  . pantoprazole (PROTONIX) IV  40 mg Intravenous Once  . QUEtiapine  50 mg Oral QHS  . sodium chloride flush  3 mL Intravenous Q12H  . tamsulosin  0.4 mg Oral Daily  . thiamine injection  100 mg Intravenous Daily  . zinc sulfate  220 mg Oral Daily   Continuous Infusions: . ampicillin (OMNIPEN) IV 2 g (06/28/20 0445)  . cefTRIAXone (ROCEPHIN)  IV 2 g (06/27/20 2152)  . dextrose 5 % lactated ringers with kcl 75 mL/hr at 06/28/20 0109  . pantoprozole (PROTONIX) infusion    . pantoprazole (PROTONIX) IVPB     PRN Meds:.acetaminophen, acetaminophen, guaiFENesin-dextromethorphan, haloperidol lactate, hydrALAZINE, magic mouthwash w/lidocaine, metoprolol tartrate, [DISCONTINUED] ondansetron **OR** ondansetron (ZOFRAN) IV, Resource ThickenUp Clear, white petrolatum  Antibiotics  :    Anti-infectives (From admission, onward)   Start     Dose/Rate Route Frequency Ordered Stop   06/27/20 1400  ampicillin (OMNIPEN) 2 g in sodium chloride 0.9 % 100 mL IVPB        2 g 300 mL/hr over 20 Minutes Intravenous Every 4 hours 06/27/20 1216     06/25/20 1947  Ampicillin-Sulbactam (UNASYN) 3 g in sodium chloride 0.9 % 100 mL IVPB  Status:  Discontinued        3 g 200 mL/hr over 30 Minutes Intravenous Every 6 hours 06/25/20 1419 06/27/20 1218   06/24/20 2200  cefTRIAXone (ROCEPHIN) 2 g in sodium chloride 0.9 %  100 mL IVPB        2 g 200 mL/hr over 30 Minutes Intravenous Every 12 hours 06/24/20 1513     06/24/20 1230  cefTRIAXone (ROCEPHIN) 2 g in sodium chloride 0.9 % 100 mL IVPB  Status:  Discontinued        2 g 200 mL/hr over 30 Minutes Intravenous Every 24 hours 06/24/20 1139 06/24/20 1513   06/23/20 1430  azithromycin (ZITHROMAX) 500 mg in sodium chloride 0.9 % 250 mL IVPB  Status:  Discontinued        500  mg 250 mL/hr over 60 Minutes Intravenous Every 24 hours 06/22/20 1406 06/22/20 1410   06/23/20 1300  Ampicillin-Sulbactam (UNASYN) 3 g in sodium chloride 0.9 % 100 mL IVPB  Status:  Discontinued        3 g 200 mL/hr over 30 Minutes Intravenous Every 8 hours 06/23/20 1131 06/25/20 1419   06/23/20 1000  remdesivir 100 mg in sodium chloride 0.9 % 100 mL IVPB        100 mg 200 mL/hr over 30 Minutes Intravenous Daily 06/22/20 1345 06/26/20 1200   06/23/20 0945  ampicillin (OMNIPEN) 2 g in sodium chloride 0.9 % 100 mL IVPB  Status:  Discontinued        2 g 300 mL/hr over 20 Minutes Intravenous Every 4 hours 06/23/20 0852 06/23/20 1131   06/22/20 1430  azithromycin (ZITHROMAX) 500 mg in sodium chloride 0.9 % 250 mL IVPB  Status:  Discontinued        500 mg 250 mL/hr over 60 Minutes Intravenous Every 24 hours 06/22/20 1410 06/23/20 0902   06/22/20 1400  remdesivir 100 mg in sodium chloride 0.9 % 100 mL IVPB        100 mg 200 mL/hr over 30 Minutes Intravenous Every 30 min 06/22/20 1345 06/22/20 1518   06/22/20 1400  cefTRIAXone (ROCEPHIN) 2 g in sodium chloride 0.9 % 100 mL IVPB  Status:  Discontinued        2 g 200 mL/hr over 30 Minutes Intravenous Every 24 hours 06/22/20 1357 06/23/20 0902   06/22/20 1400  azithromycin (ZITHROMAX) 500 mg in sodium chloride 0.9 % 250 mL IVPB  Status:  Discontinued        500 mg 250 mL/hr over 60 Minutes Intravenous Every 24 hours 06/22/20 1357 06/22/20 1406       Time Spent in minutes  30   Susa Raring M.D on 06/28/2020 at 8:42 AM  To page go to [email or web address] - password [facility]  [facility] -  Office  [phone]   See all Orders from today for further details    Objective:   Vitals:   06/28/20 0410 06/28/20 0438 06/28/20 0444 06/28/20 0544  BP: (!) 139/109  (!) 139/106 (!) 123/104  Pulse: 97  97 64  Resp: (!) 25   18  Temp: 98.9 F (37.2 C)   98.8 F (37.1 C)  TempSrc: Axillary   Axillary  SpO2: (!) 86% 91%  99%  Weight:        Height:        Wt Readings from Last 3 Encounters:  06/23/20 111 kg  03/14/20 110.3 kg  09/17/19 106.6 kg     Intake/Output Summary (Last 24 hours) at 06/28/2020 0842 Last data filed at 06/28/2020 8295 Gross per 24 hour  Intake 475 ml  Output 800 ml  Net -325 ml     Physical Exam  Somnolent and severely confused, wearing mittens and under arm restraints,  moves all 4 extremities Blair.AT,PERRAL Supple Neck,No JVD, No cervical lymphadenopathy appriciated.  Symmetrical Chest wall movement, Good air movement bilaterally, CTAB RRR,No Gallops, Rubs or new Murmurs, No Parasternal Heave +ve B.Sounds, Abd Soft, No tenderness, No organomegaly appriciated, No rebound - guarding or rigidity. No Cyanosis, Clubbing or edema, No new Rash or bruise      Data Review:    CBC Recent Labs  Lab 06/23/20 0140 06/24/20 0650 06/25/20 0133 06/26/20 0320 06/27/20 0420  WBC 6.2 11.0* 10.8* 9.9 10.2  HGB 10.3* 11.6* 10.3* 9.5* 9.7*  HCT 31.5* 35.3* 30.6* 29.3* 29.9*  PLT 231 264 277 240 247  MCV 103.6* 102.0* 103.0* 103.5* 104.2*  MCH 33.9 33.5 34.7* 33.6 33.8  MCHC 32.7 32.9 33.7 32.4 32.4  RDW 13.9 13.7 13.9 14.1 14.2  LYMPHSABS 0.5* 0.6* 0.4* 0.4* 0.3*  MONOABS 0.2 0.4 0.5 0.5 0.4  EOSABS 0.0 0.0 0.0 0.0 0.0  BASOSABS 0.0 0.0 0.0 0.0 0.0    Recent Labs  Lab  0000 06/22/20 1143 06/22/20 1143 06/22/20 1144 06/22/20 1405 06/22/20 2235 06/22/20 2236 06/23/20 0140 06/23/20 0626 06/24/20 0650 06/25/20 0133 06/26/20 0320 06/27/20 0420  NA  --  134*  --   --   --   --    < > 135  --  137 138 140 141  K  --  3.2*  --   --   --   --    < > 3.6  --  3.4* 3.3* 3.8 3.5  CL  --  95*  --   --   --   --    < > 103  --  101 103 106 106  CO2  --  24  --   --   --   --    < > 23  --  23 25 26 24   GLUCOSE  --  114*  --   --   --   --    < > 144*  --  146* 130* 132* 109*  BUN  --  23  --   --   --   --    < > 22  --  22 19 17 16   CREATININE  --  1.34*  --   --   --   --    < > 1.22  --   1.07 1.05 0.93 0.99  CALCIUM  --  8.4*  --   --   --   --    < > 7.9*  --  8.5* 8.6* 8.8* 8.7*  AST   < > 31  --   --   --   --   --  31  --  39 62* 77* 76*  ALT   < > 20  --   --   --   --   --  21  --  27 41 67* 82*  ALKPHOS   < > 55  --   --   --   --   --  48  --  49 47 46 46  BILITOT   < > 1.5*  --   --   --   --   --  1.3*  --  0.8 0.6 0.5 0.8  ALBUMIN   < > 2.5*  --   --   --   --   --  1.7*  --  2.1* 2.0* 2.0* 2.2*  MG  --   --   --   --   --   --    < >  1.0*  --  1.4* 1.3* 1.5* 1.5*  CRP   < >  --   --    < >  --   --   --  9.0*  --  3.7* 1.7* 0.9 0.7  DDIMER   < > 0.67*  --   --   --   --   --  0.39  --  0.51* 0.47 0.59* 0.61*  PROCALCITON  --  0.86   < >  --   --   --   --   --  0.52 0.30 0.19 <0.10 <0.10  LATICACIDVEN  --  2.4*  --   --  1.7  --   --   --   --   --   --   --   --   AMMONIA  --   --   --   --   --  11  --   --   --   --   --   --   --   BNP  --   --   --   --   --   --   --  278.3*  --  366.6* 328.9* 489.1* 834.1*   < > = values in this interval not displayed.    ------------------------------------------------------------------------------------------------------------------ No results for input(s): CHOL, HDL, LDLCALC, TRIG, CHOLHDL, LDLDIRECT in the last 72 hours.  No results found for: HGBA1C ------------------------------------------------------------------------------------------------------------------ No results for input(s): TSH, T4TOTAL, T3FREE, THYROIDAB in the last 72 hours.  Invalid input(s): FREET3  Cardiac Enzymes No results for input(s): CKMB, TROPONINI, MYOGLOBIN in the last 168 hours.  Invalid input(s): CK ------------------------------------------------------------------------------------------------------------------    Component Value Date/Time   BNP 834.1 (H) 06/27/2020 0420    Micro Results Recent Results (from the past 240 hour(s))  Blood Culture (routine x 2)     Status: Abnormal   Collection Time: 06/22/20 11:41 AM    Specimen: Right Antecubital; Blood  Result Value Ref Range Status   Specimen Description   Final    RIGHT ANTECUBITAL Performed at [facility], [address] [place], [place] [address]    Special Requests   Final    BOTTLES DRAWN AEROBIC AND ANAEROBIC Blood Culture adequate volume Performed at [facility], [address] [place], [place] [address]    Culture  Setup Time   Final    IN BOTH AEROBIC AND ANAEROBIC BOTTLES GRAM POSITIVE COCCI Organism ID to follow CRITICAL RESULT CALLED TO, READ BACK BY AND VERIFIED WITH: Lana Fish PharmD 8:25 06/23/20 ([email or web address]) Performed at [facility], [address], [place], [place] [address]    Culture ENTEROCOCCUS FAECALIS (A)  Final   Report Status 06/25/2020 FINAL  Final   Organism ID, Bacteria ENTEROCOCCUS FAECALIS  Final      Susceptibility   Enterococcus faecalis - MIC*    AMPICILLIN <=2 SENSITIVE Sensitive     VANCOMYCIN 2 SENSITIVE Sensitive     GENTAMICIN SYNERGY SENSITIVE Sensitive     * ENTEROCOCCUS FAECALIS  Blood Culture ID Panel (Reflexed)     Status: Abnormal   Collection Time: 06/22/20 11:41 AM  Result Value Ref Range Status   Enterococcus faecalis DETECTED (A) NOT DETECTED Final    Comment: CRITICAL RESULT CALLED TO, READ BACK BY AND VERIFIED WITH: Lana Fish PharmD 8:25 06/23/20 ([email or web address])    Enterococcus Faecium NOT DETECTED NOT DETECTED Final   Listeria monocytogenes NOT DETECTED NOT DETECTED Final   Staphylococcus species NOT  DETECTED NOT DETECTED Final   Staphylococcus aureus (BCID) NOT DETECTED NOT DETECTED Final   Staphylococcus epidermidis NOT DETECTED NOT DETECTED Final   Staphylococcus lugdunensis NOT DETECTED NOT DETECTED Final   Streptococcus species NOT DETECTED NOT DETECTED Final   Streptococcus agalactiae NOT DETECTED NOT DETECTED Final   Streptococcus pneumoniae NOT DETECTED NOT DETECTED Final   Streptococcus pyogenes NOT DETECTED NOT DETECTED Final   A.calcoaceticus-baumannii  NOT DETECTED NOT DETECTED Final   Bacteroides fragilis NOT DETECTED NOT DETECTED Final   Enterobacterales NOT DETECTED NOT DETECTED Final   Enterobacter cloacae complex NOT DETECTED NOT DETECTED Final   Escherichia coli NOT DETECTED NOT DETECTED Final   Klebsiella aerogenes NOT DETECTED NOT DETECTED Final   Klebsiella oxytoca NOT DETECTED NOT DETECTED Final   Klebsiella pneumoniae NOT DETECTED NOT DETECTED Final   Proteus species NOT DETECTED NOT DETECTED Final   Salmonella species NOT DETECTED NOT DETECTED Final   Serratia marcescens NOT DETECTED NOT DETECTED Final   Haemophilus influenzae NOT DETECTED NOT DETECTED Final   Neisseria meningitidis NOT DETECTED NOT DETECTED Final   Pseudomonas aeruginosa NOT DETECTED NOT DETECTED Final   Stenotrophomonas maltophilia NOT DETECTED NOT DETECTED Final   Candida albicans NOT DETECTED NOT DETECTED Final   Candida auris NOT DETECTED NOT DETECTED Final   Candida glabrata NOT DETECTED NOT DETECTED Final   Candida krusei NOT DETECTED NOT DETECTED Final   Candida parapsilosis NOT DETECTED NOT DETECTED Final   Candida tropicalis NOT DETECTED NOT DETECTED Final   Cryptococcus neoformans/gattii NOT DETECTED NOT DETECTED Final   Vancomycin resistance NOT DETECTED NOT DETECTED Final    Comment: Performed at [facility], [address], [place], [place] [address]  Respiratory Panel by RT PCR (Flu A&B, Covid) - Nasopharyngeal Swab     Status: Abnormal   Collection Time: 06/22/20 11:43 AM   Specimen: Nasopharyngeal Swab  Result Value Ref Range Status   SARS Coronavirus 2 by RT PCR POSITIVE (A) NEGATIVE Final    Comment: RESULT CALLED TO, READ BACK BY AND VERIFIED WITH:  COBLE SAM,RN @1323  ON 06/22/2020, [email or web address] (NOTE) SARS-CoV-2 target nucleic acids are DETECTED.  SARS-CoV-2 RNA is generally detectable in upper respiratory specimens  during the acute phase of infection. Positive results are indicative of the presence of the identified  virus, but do not rule out bacterial infection or co-infection with other pathogens not detected by the test. Clinical correlation with patient history and other diagnostic information is necessary to determine patient infection status. The expected result is Negative.  Fact Sheet for Patients:  06/24/2020  Fact Sheet for Healthcare Providers: https://www.moore.com/  This test is not yet approved or cleared by the https://www.young.biz/ FDA and  has been authorized for detection and/or diagnosis of SARS-CoV-2 by FDA under an Emergency Use Authorization (EUA).  This EUA will remain in effect (meaning this  test can be used) for the duration of  the COVID-19 declaration under Section 564(b)(1) of the Act, 21 U.S.C. section 360bbb-3(b)(1), unless the authorization is terminated or revoked sooner.      Influenza A by PCR NEGATIVE NEGATIVE Final   Influenza B by PCR NEGATIVE NEGATIVE Final    Comment: (NOTE) The Xpert Xpress SARS-CoV-2/FLU/RSV assay is intended as an aid in  the diagnosis of influenza from Nasopharyngeal swab specimens and  should not be used as a sole basis for treatment. Nasal washings and  aspirates are unacceptable for Xpert Xpress SARS-CoV-2/FLU/RSV  testing.  Fact Sheet for Patients: [place]  Fact  Sheet for Healthcare Providers: https://www.young.biz/  This test is not yet approved or cleared by the [place] and  has been authorized for detection and/or diagnosis of SARS-CoV-2 by  FDA under an Emergency Use Authorization (EUA). This EUA will remain  in effect (meaning this test can be used) for the duration of the  Covid-19 declaration under Section 564(b)(1) of the Act, 21  U.S.C. section 360bbb-3(b)(1), unless the authorization is  terminated or revoked. Performed at [facility], [address] [place], [place] [address]   Blood Culture  (routine x 2)     Status: Abnormal   Collection Time: 06/22/20 11:58 AM   Specimen: Left Antecubital; Blood  Result Value Ref Range Status   Specimen Description   Final    LEFT ANTECUBITAL Performed at [facility], [address] [place], [place] [address]    Special Requests   Final    BOTTLES DRAWN AEROBIC AND ANAEROBIC Blood Culture adequate volume Performed at [facility], [address] [place], [place] [address]    Culture  Setup Time   Final    IN BOTH AEROBIC AND ANAEROBIC BOTTLES GRAM POSITIVE COCCI CRITICAL VALUE NOTED.  VALUE IS CONSISTENT WITH PREVIOUSLY REPORTED AND CALLED VALUE.    Culture (A)  Final    ENTEROCOCCUS FAECALIS SUSCEPTIBILITIES PERFORMED ON PREVIOUS CULTURE WITHIN THE LAST 5 DAYS. Performed at [facility], [address] [place], [place] [address]    Report Status 06/25/2020 FINAL  Final  MRSA PCR Screening     Status: None   Collection Time: 06/23/20  8:08 AM   Specimen: Nasal Mucosa; Nasopharyngeal  Result Value Ref Range Status   MRSA by PCR NEGATIVE NEGATIVE Final    Comment:        The GeneXpert MRSA Assay (FDA approved for NASAL specimens only), is one component of a comprehensive MRSA colonization surveillance program. It is not intended to diagnose MRSA infection nor to guide or monitor treatment for MRSA infections. Performed at [facility], [address], [place], [place] [address]   Urine culture     Status: None   Collection Time: 06/23/20 12:01 PM   Specimen: Urine, Clean Catch  Result Value Ref Range Status   Specimen Description   Final    URINE, CLEAN CATCH Performed at [facility], [address] [place], [place] [address]    Special Requests   Final    NONE Performed at [facility], [address] [place], [place] [address]    Culture   Final    NO GROWTH Performed at [facility], [address] [place], [place] [address]    Report Status  06/24/2020 FINAL  Final  Culture, blood (routine x 2)     Status: None (Preliminary result)   Collection Time: 06/24/20  6:50 AM   Specimen: BLOOD LEFT HAND  Result Value Ref Range Status   Specimen Description BLOOD LEFT HAND  Final   Special Requests   Final    BOTTLES DRAWN AEROBIC AND ANAEROBIC Blood Culture adequate volume   Culture   Final    NO GROWTH 4 DAYS Performed at [facility], [address], [place], [place] [address]    Report Status PENDING  Incomplete  Culture, blood (routine x 2)     Status: None (Preliminary result)   Collection Time: 06/24/20  6:50 AM  Specimen: BLOOD RIGHT HAND  Result Value Ref Range Status   Specimen Description BLOOD RIGHT HAND  Final   Special Requests   Final    BOTTLES DRAWN AEROBIC AND ANAEROBIC Blood Culture adequate volume   Culture   Final    NO GROWTH 4 DAYS Performed at [facility], [address], [place], [place] [address]    Report Status PENDING  Incomplete    Radiology Reports CT HEAD WO CONTRAST  Result Date: 06/22/2020 CLINICAL DATA:  Delirium. EXAM: CT HEAD WITHOUT CONTRAST TECHNIQUE: Contiguous axial images were obtained from the base of the skull through the vertex without intravenous contrast. COMPARISON:  Head CT 05/14/2016 FINDINGS: Brain: No intracranial hemorrhage, mass effect, or midline shift. Age related atrophy. No hydrocephalus. The basilar cisterns are patent. Mild to moderate chronic small vessel ischemia with slight progression from 2017. No evidence of territorial infarct or acute ischemia. No extra-axial or intracranial fluid collection. Vascular: Atherosclerosis of skullbase vasculature without hyperdense vessel or abnormal calcification. Skull: No fracture or focal lesion. Sinuses/Orbits: Opacification of the lower right greater than left mastoid air cells, progressed from 2017. Small fluid levels in the left and right side of sphenoid sinus. No acute orbital abnormality. Bilateral cataract  resection. Other: None. IMPRESSION: 1. No acute intracranial abnormality. 2. Age related atrophy and chronic small vessel ischemia. 3. Opacification of the lower right greater than left mastoid air cells likely mastoid effusion, progressed from 2017. Small fluid levels in the left and right side of the sphenoid sinus. Electronically Signed   By: Narda Rutherford M.D.   On: 06/22/2020 22:36   DG CHEST PORT 1 VIEW  Result Date: 06/27/2020 CLINICAL DATA:  PICC in place EXAM: PORTABLE CHEST 1 VIEW COMPARISON:  06/27/2020 FINDINGS: Left arm PICC terminates in the mid SVC. Subpleural patchy opacities in the lungs bilaterally, left lower lobe predominant, compatible with pneumonia in this patient with known COVID. No pleural effusion or pneumothorax. The heart is top-normal in size. IMPRESSION: Left arm PICC terminates in the mid SVC. Multifocal pneumonia in this patient with known COVID. Electronically Signed   By: Charline Bills M.D.   On: 06/27/2020 09:35   DG Chest Port 1 View  Result Date: 06/27/2020 CLINICAL DATA:  Shortness of breath, COVID-19 positive. EXAM: PORTABLE CHEST 1 VIEW COMPARISON:  June 23, 2020. FINDINGS: Stable cardiomegaly. Stable multiple airspace opacities are noted bilaterally consistent with multifocal pneumonia. No pneumothorax or pleural effusion is noted. Bony thorax is unremarkable. IMPRESSION: Stable bilateral multifocal pneumonia. Electronically Signed   By: Lupita Raider M.D.   On: 06/27/2020 08:07   DG Chest Port 1 View  Result Date: 06/23/2020 CLINICAL DATA:  Shortness of breath and COVID. EXAM: PORTABLE CHEST 1 VIEW COMPARISON:  06/22/2020 FINDINGS: 0846 hours. Low lung volumes. Progressive patchy ground-glass and consolidative airspace opacity in the lower lungs. The cardio pericardial silhouette is enlarged. No substantial pleural effusion. The visualized bony structures of the thorax show no acute abnormality. Telemetry leads overlie the chest. IMPRESSION:  Progressive bibasilar airspace disease consistent with multifocal pneumonia. Electronically Signed   By: Kennith Center M.D.   On: 06/23/2020 09:04   DG Chest Port 1 View  Result Date: 06/22/2020 CLINICAL DATA:  COVID, altered mental status. EXAM: PORTABLE CHEST 1 VIEW COMPARISON:  10/19/2014. FINDINGS: Low lung volumes. Subtle airspace opacities in the peripheral lung bases. No visible pleural effusions or pneumothorax. Cardiomediastinal silhouette is within normal limits. No evidence of acute osseous abnormality. IMPRESSION: Subtle airspace  opacities in the peripheral lung bases, concerning for multifocal pneumonia and consistent with reported COVID diagnosis. Electronically Signed   By: Feliberto Harts MD   On: 06/22/2020 11:39   DG Swallowing Func-Speech Pathology  Result Date: 06/23/2020 Objective Swallowing Evaluation: Type of Study: MBS-Modified Barium Swallow Study  Patient Details Name: EBIN PALAZZI MRN: [id number] Date of Birth: 1941/08/14 Today's Date: 06/23/2020 Time: SLP Start Time (ACUTE ONLY): 1417 -SLP Stop Time (ACUTE ONLY): 1437 SLP Time Calculation (min) (ACUTE ONLY): 20 min Past Medical History: Past Medical History: Diagnosis Date . Arthritis  . Atrial fibrillation (HCC) 01/31/2015 . Chronic gout  . Coronary artery disease 12/2007  STATUS POST STENTING OF THE LEFT ANTERIOR DESCENDING ARTERY . COVID-19  . Dyslipidemia  . Gout  . Hiatal hernia  . Hyperlipidemia  . Hypertension  . LBP (low back pain)  . Partial small bowel obstruction (HCC)  . RBBB (right bundle branch block)  . SBO (small bowel obstruction) (HCC)   PARTIAL . Second degree AV block, Mobitz type I  . Urinary tract infection   Escherichia coli urinary tract infection Past Surgical History: Past Surgical History: Procedure Laterality Date . CARDIAC CATHETERIZATION   . HERNIA REPAIR    INGUINAL . ROTATOR CUFF REPAIR   HPI: 79 yo male presenting to ED with generalized weakness, poor food intake, and confusion. COVID positive.  PMH including hiatal hernia, arthritis, A-fib, HTN, AKI, alcohol abuse, and CAD with s/p stenting of L anterior descending artery. CXR Progressive bibasilar airspace disease consistent with multifocal pneumonia.  No data recorded Assessment / Plan / Recommendation CHL IP CLINICAL IMPRESSIONS 06/23/2020 Clinical Impression Pt exhibits mild oropharyngeal dysphagia with aspiration of nectar consistency, and mild vallecular residual. 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 Pt unable to contain entire bolus in oral cavity and moderate amount nectar thick fell over tongue and was aspirated prior to initiation of laryngeal closure (frank volume) with immediate cough. Timing of closure delayed with thin using chin tuck and slight aspiraiton present. Lingual residue present falling into valleculae post swallow. Chin down posture using straw was effective with nectar however pt displays memory deficit and follow through of strategy is questionable. 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 Recommend honey thick liquids, Dys 3, crush meds. full supervision/assist. SLP will work with pt on chin tuck with nectar for possible upgrade.  SLP Visit Diagnosis Dysphagia, oropharyngeal phase (R13.12) Attention and concentration deficit following -- Frontal lobe and executive function deficit following -- Impact on safety and function Mild aspiration risk;Moderate aspiration risk   CHL IP TREATMENT RECOMMENDATION 06/23/2020 Treatment Recommendations Therapy as outlined in treatment plan below   Prognosis 06/23/2020 Prognosis for Safe Diet Advancement (No Data) Barriers to Reach Goals Cognitive deficits Barriers/Prognosis Comment -- CHL IP DIET RECOMMENDATION 06/23/2020 SLP Diet Recommendations Dysphagia 3 (Mech soft) solids;Honey thick liquids Liquid Administration via Cup Medication Administration Crushed with  puree Compensations Minimize environmental distractions;Slow rate;Small sips/bites Postural Changes Seated upright at 90 degrees;Remain semi-upright after after feeds/meals (Comment)   CHL IP OTHER RECOMMENDATIONS 06/23/2020 Recommended Consults -- Oral Care Recommendations Oral care BID Other Recommendations --   CHL IP FOLLOW UP RECOMMENDATIONS 06/23/2020 Follow up Recommendations (No Data)   CHL IP FREQUENCY AND DURATION 06/23/2020 Speech Therapy Frequency (ACUTE ONLY) min 2x/week Treatment Duration 2 weeks      CHL IP ORAL PHASE 06/23/2020 Oral Phase Impaired Oral - Pudding Teaspoon --  Oral - Pudding Cup -- Oral - Honey Teaspoon -- Oral - Honey Cup Lingual/palatal residue;Piecemeal swallowing Oral - Nectar Teaspoon -- Oral - Nectar Cup Decreased bolus cohesion Oral - Nectar Straw Decreased bolus cohesion Oral - Thin Teaspoon -- Oral - Thin Cup -- Oral - Thin Straw -- Oral - Puree Piecemeal swallowing Oral - Mech Soft -- Oral - Regular -- Oral - Multi-Consistency -- Oral - Pill -- Oral Phase - Comment --  CHL IP PHARYNGEAL PHASE 06/23/2020 Pharyngeal Phase Impaired Pharyngeal- Pudding Teaspoon -- Pharyngeal -- Pharyngeal- Pudding Cup -- Pharyngeal -- Pharyngeal- Honey Teaspoon -- Pharyngeal -- Pharyngeal- Honey Cup Pharyngeal residue - valleculae Pharyngeal -- Pharyngeal- Nectar Teaspoon -- Pharyngeal -- Pharyngeal- Nectar Cup Penetration/Aspiration before swallow Pharyngeal Material enters airway, passes BELOW cords and not ejected out despite cough attempt by patient Pharyngeal- Nectar Straw Penetration/Aspiration during swallow Pharyngeal Material enters airway, remains ABOVE vocal cords then ejected out;Material does not enter airway Pharyngeal- Thin Teaspoon NT Pharyngeal -- Pharyngeal- Thin Cup NT Pharyngeal -- Pharyngeal- Thin Straw Penetration/Aspiration during swallow Pharyngeal Material enters airway, passes BELOW cords without attempt by patient to eject out (silent aspiration) Pharyngeal- Puree  Pharyngeal residue - valleculae Pharyngeal -- Pharyngeal- Mechanical Soft -- Pharyngeal -- Pharyngeal- Regular WFL Pharyngeal -- Pharyngeal- Multi-consistency -- Pharyngeal -- Pharyngeal- Pill -- Pharyngeal -- Pharyngeal Comment --  CHL IP CERVICAL ESOPHAGEAL PHASE 06/23/2020 Cervical Esophageal Phase WFL Pudding Teaspoon -- Pudding Cup -- Honey Teaspoon -- Honey Cup -- Nectar Teaspoon -- Nectar Cup -- Nectar Straw -- Thin Teaspoon -- Thin Cup -- Thin Straw -- Puree -- Mechanical Soft -- Regular -- Multi-consistency -- Pill -- Cervical Esophageal Comment -- Royce MacadamiaLitaker, Lisa Willis 06/23/2020, 4:04 PM Breck CoonsLisa Willis Lonell FaceLitaker M.Ed Sports administratorCCC-SLP Speech-Language Pathologist Pager [phone] Office [phone]              ECHOCARDIOGRAM COMPLETE  Result Date: 06/23/2020    ECHOCARDIOGRAM REPORT   Patient Name:   Gregory GlazierJACK S Wheeler Date of Exam: 06/23/2020 Medical Rec #:  [id number]      Height:       71.0 in Accession #:    [id number]     Weight:       244.7 lb Date of Birth:  1940-12-30      BSA:          2.297 m Patient Age:    79 years       BP:           170/76 mmHg Patient Gender: M              HR:           71 bpm. Exam Location:  Inpatient Procedure: 2D Echo, Cardiac Doppler and Color Doppler Indications:    R07.9* Chest pain, unspecified. Elevated troponin  History:        Patient has prior history of Echocardiogram examinations, most                 recent 02/24/2019. CAD, Abnormal ECG, Arrythmias:RBBB,                 Signs/Symptoms:Hypotension; Risk Factors:Dyslipidemia. Covid 19                 positive. ETOH.  Sonographer:    Sheralyn Boatmanina West RDCS Referring Phys: [id number] ANASTASSIA DOUTOVA  Sonographer Comments: Patient in Fowlaer's position. IMPRESSIONS  1. Left ventricular ejection fraction, by estimation, is 60 to 65%. The left ventricle has normal function. The left ventricle has no regional  wall motion abnormalities. There is mild left ventricular hypertrophy. Left ventricular diastolic parameters are consistent with Grade I  diastolic dysfunction (impaired relaxation).  2. Right ventricular systolic function is moderately reduced. The right ventricular size is normal. There is normal pulmonary artery systolic pressure. The estimated right ventricular systolic pressure is 33.2 mmHg. Abnormal RV, would consider PE.  3. Right atrial size was mildly dilated.  4. The mitral valve is normal in structure. No evidence of mitral valve regurgitation. No evidence of mitral stenosis.  5. The aortic valve is tricuspid. Aortic valve regurgitation is trivial. Mild aortic valve sclerosis is present, with no evidence of aortic valve stenosis.  6. The inferior vena cava is normal in size with greater than 50% respiratory variability, suggesting right atrial pressure of 3 mmHg. FINDINGS  Left Ventricle: Left ventricular ejection fraction, by estimation, is 60 to 65%. The left ventricle has normal function. The left ventricle has no regional wall motion abnormalities. The left ventricular internal cavity size was normal in size. There is  mild left ventricular hypertrophy. Left ventricular diastolic parameters are consistent with Grade I diastolic dysfunction (impaired relaxation). Right Ventricle: The right ventricular size is normal. No increase in right ventricular wall thickness. Right ventricular systolic function is moderately reduced. There is normal pulmonary artery systolic pressure. The tricuspid regurgitant velocity is 2.75 m/s, and with an assumed right atrial pressure of 3 mmHg, the estimated right ventricular systolic pressure is 33.2 mmHg. Left Atrium: Left atrial size was normal in size. Right Atrium: Right atrial size was mildly dilated. Pericardium: There is no evidence of pericardial effusion. Mitral Valve: The mitral valve is normal in structure. No evidence of mitral valve regurgitation. No evidence of mitral valve stenosis. Tricuspid Valve: The tricuspid valve is normal in structure. Tricuspid valve regurgitation is trivial. Aortic  Valve: The aortic valve is tricuspid. Aortic valve regurgitation is trivial. Mild aortic valve sclerosis is present, with no evidence of aortic valve stenosis. Pulmonic Valve: The pulmonic valve was normal in structure. Pulmonic valve regurgitation is not visualized. Aorta: The aortic root is normal in size and structure. Venous: The inferior vena cava is normal in size with greater than 50% respiratory variability, suggesting right atrial pressure of 3 mmHg. IAS/Shunts: No atrial level shunt detected by color flow Doppler.  LEFT VENTRICLE PLAX 2D LVIDd:         3.80 cm     Diastology LVIDs:         2.60 cm     LV e' medial:    4.35 cm/s LV PW:         1.70 cm     LV E/e' medial:  16.9 LV IVS:        1.50 cm     LV e' lateral:   5.33 cm/s LVOT diam:     2.30 cm     LV E/e' lateral: 13.8 LV SV:         97 LV SV Index:   42 LVOT Area:     4.15 cm  LV Volumes (MOD) LV vol d, MOD A2C: 78.7 ml LV vol d, MOD A4C: 84.2 ml LV vol s, MOD A2C: 17.5 ml LV vol s, MOD A4C: 25.2 ml LV SV MOD A2C:     61.2 ml LV SV MOD A4C:     84.2 ml LV SV MOD BP:      64.3 ml RIGHT VENTRICLE             IVC RV S  prime:     18.50 cm/s  IVC diam: 2.20 cm TAPSE (M-mode): 3.2 cm LEFT ATRIUM             Index       RIGHT ATRIUM           Index LA diam:        3.70 cm 1.61 cm/m  RA Area:     18.30 cm LA Vol (A2C):   34.1 ml 14.84 ml/m RA Volume:   53.10 ml  23.11 ml/m LA Vol (A4C):   49.7 ml 21.63 ml/m LA Biplane Vol: 43.7 ml 19.02 ml/m  AORTIC VALVE LVOT Vmax:   124.00 cm/s LVOT Vmean:  89.800 cm/s LVOT VTI:    0.233 m  AORTA Ao Root diam: 3.50 cm MITRAL VALVE               TRICUSPID VALVE MV Area (PHT): 3.21 cm    TR Peak grad:   30.2 mmHg MV Decel Time: 236 msec    TR Vmax:        275.00 cm/s MV E velocity: 73.30 cm/s MV A velocity: 99.05 cm/s  SHUNTS MV E/A ratio:  0.74        Systemic VTI:  0.23 m                            Systemic Diam: 2.30 cm Marca Ancona MD Electronically signed by Marca Ancona MD Signature Date/Time:  06/23/2020/4:53:51 PM    Final    [place] EKG SITE RITE  Result Date: 06/25/2020 If Site Rite image not attached, placement could not be confirmed due to current cardiac rhythm.

## 2020-06-28 NOTE — Consult Note (Addendum)
Referring Provider: Dr. Thedore MinsSingh Primary Care Physician:  Jacqualine MauEdelen, Kevin B, NP Primary Gastroenterologist:  Gentry FitzUnassigned  Reason for Consultation:  Anemia, rectal bleeding  HPI: Gregory Wheeler is a 79 y.o. male with history of A fib, IDA, SBO, and recent Covid-19 infection complicated by acute hypoxic respiratory failure and bacterial pneumonia and bacteremia presenting for anemia and rectal bleeding.  Due to altered mental status, patient unable to provide any subjective data.  Per RN, patient had a "brick red" bowel movement with dark clots.  Initially, reported as melena, though sounds more like hematochezia.  She states he has had one episode thus far.  Per chart review, patient has a history of chronic iron deficiency anemia.  Patient was referred to GI but it does not appear that he ever followed up.  Hemoglobin 9.8 today, stable over the last 2 days, though decreased from 13.2 as of 06/22/2020.  Past Medical History:  Diagnosis Date  . Arthritis   . Atrial fibrillation (HCC) 01/31/2015  . Chronic gout   . Coronary artery disease 12/2007   STATUS POST STENTING OF THE LEFT ANTERIOR DESCENDING ARTERY  . COVID-19   . Dyslipidemia   . Gout   . Hiatal hernia   . Hyperlipidemia   . Hypertension   . LBP (low back pain)   . Partial small bowel obstruction (HCC)   . RBBB (right bundle branch block)   . SBO (small bowel obstruction) (HCC)    PARTIAL  . Second degree AV block, Mobitz type I   . Urinary tract infection    Escherichia coli urinary tract infection    Past Surgical History:  Procedure Laterality Date  . CARDIAC CATHETERIZATION    . HERNIA REPAIR     INGUINAL  . ROTATOR CUFF REPAIR      Prior to Admission medications   Medication Sig Start Date End Date Taking? Authorizing Provider  acetaminophen (TYLENOL) 325 MG tablet Take 650 mg by mouth 2 (two) times daily.   Yes [provider]  allopurinol (ZYLOPRIM) 300 MG tablet Take 1 tablet (300 mg total) by mouth  daily. Patient taking differently: Take 300 mg by mouth at bedtime.  12/01/18  Yes [place], Peter M, MD  ALPRAZolam Prudy Feeler(XANAX) 0.5 MG tablet Take 0.5-1 mg by mouth at bedtime.   Yes [provider]  ascorbic acid (VITAMIN C) 1000 MG tablet Take 1,000 mg by mouth daily.   Yes [provider]  famotidine (PEPCID) 40 MG tablet Take 40 mg by mouth daily as needed for heartburn or indigestion.   Yes [provider]  folic acid (FOLVITE) 1 MG tablet Take 2 mg by mouth daily.  01/21/19  Yes [provider]  metoprolol succinate (TOPROL-XL) 50 MG 24 hr tablet TAKE 1 TABLET DAILY. TAKE WITH OR IMMEDIATELY FOLLOWING A MEAL. Patient taking differently: Take 50 mg by mouth daily. TAKE WITH OR IMMEDIATELY FOLLOWING A MEAL. 09/16/19  Yes [place], Peter M, MD  rivaroxaban (XARELTO) 20 MG TABS tablet Take 1 tablet (20 mg total) by mouth daily with supper. 03/14/20  Yes [place], Peter M, MD  zinc gluconate 50 MG tablet Take 50 mg by mouth daily.   Yes [provider]  ALPRAZolam (XANAX) 0.25 MG tablet Take 1 tablet (0.25 mg total) by mouth 3 (three) times daily as needed for anxiety. 07/03/18   [place], Peter M, MD  iron polysaccharides (NIFEREX) 150 MG capsule Take 1 capsule (150 mg total) by mouth daily. 09/17/19   [place], Peter M,  MD    Scheduled Meds: . allopurinol  300 mg Oral QHS  . amLODipine  10 mg Oral Daily  . chlorhexidine  15 mL Mouth Rinse BID  . Chlorhexidine Gluconate Cloth  6 each Topical Daily  . folic acid  1 mg Oral Daily  . lactose free nutrition  237 mL Oral TID WC  . mouth rinse  15 mL Mouth Rinse q12n4p  . metoprolol tartrate  25 mg Oral BID  . multivitamin with minerals  1 tablet Oral Daily  . nitroGLYCERIN  0.5 inch Topical Q6H  . [START ON 07/01/2020] pantoprazole  40 mg Intravenous Q12H  . QUEtiapine  50 mg Oral QHS  . sodium chloride flush  3 mL Intravenous Q12H  . tamsulosin  0.4 mg Oral Daily  . thiamine injection  100 mg Intravenous Daily  .  zinc sulfate  220 mg Oral Daily   Continuous Infusions: . ampicillin (OMNIPEN) IV 2 g (06/28/20 0445)  . cefTRIAXone (ROCEPHIN)  IV 2 g (06/27/20 2152)  . dextrose 5 % lactated ringers with kcl    . magnesium sulfate bolus IVPB 2 g (06/28/20 1013)  . pantoprozole (PROTONIX) infusion 8 mg/hr (06/28/20 1008)   PRN Meds:.acetaminophen, acetaminophen, guaiFENesin-dextromethorphan, haloperidol lactate, hydrALAZINE, magic mouthwash w/lidocaine, metoprolol tartrate, morphine injection, [DISCONTINUED] ondansetron **OR** ondansetron (ZOFRAN) IV, Resource ThickenUp Clear, white petrolatum  Allergies as of 06/22/2020  . (No Known Allergies)    Family History  Problem Relation Age of Onset  . Emphysema Mother 54  . Heart attack Father 36    Social History   Socioeconomic History  . Marital status: Married    Spouse name: Not on file  . Number of children: 3  . Years of education: Not on file  . Highest education level: Not on file  Occupational History  . Occupation: used Quarry manager  Tobacco Use  . Smoking status: Former Smoker    Quit date: 08/27/1990    Years since quitting: 29.8  . Smokeless tobacco: Never Used  Vaping Use  . Vaping Use: Never used  Substance and Sexual Activity  . Alcohol use: Yes    Comment: near daily  . Drug use: No  . Sexual activity: Not on file  Other Topics Concern  . Not on file  Social History Narrative  . Not on file   Social Determinants of Health   Financial Resource Strain:   . Difficulty of Paying Living Expenses: Not on file  Food Insecurity:   . Worried About Programme researcher, broadcasting/film/video in the Last Year: Not on file  . Ran Out of Food in the Last Year: Not on file  Transportation Needs:   . Lack of Transportation (Medical): Not on file  . Lack of Transportation (Non-Medical): Not on file  Physical Activity:   . Days of Exercise per Week: Not on file  . Minutes of Exercise per Session: Not on file  Stress:   . Feeling of Stress : Not on file   Social Connections:   . Frequency of Communication with Friends and Family: Not on file  . Frequency of Social Gatherings with Friends and Family: Not on file  . Attends Religious Services: Not on file  . Active Member of Clubs or Organizations: Not on file  . Attends Banker Meetings: Not on file  . Marital Status: Not on file  Intimate Partner Violence:   . Fear of Current or Ex-Partner: Not on file  . Emotionally Abused: Not on file  .  Physically Abused: Not on file  . Sexually Abused: Not on file    Review of Systems: Unable to obtain due to encephalopathy  Physical Exam: Vital signs: Vitals:   06/28/20 0909 06/28/20 1011  BP: (!) 227/178 (!) 163/107  Pulse: (!) 102 94  Resp: (!) 22 (!) 27  Temp: 98.2 F (36.8 C) 98.4 F (36.9 C)  SpO2: 92% 93%   Last BM Date: 06/25/20 Physical Exam Vitals reviewed.  Constitutional:      Appearance: He is overweight. He is ill-appearing.     Interventions: He is restrained. Nasal cannula in place.  HENT:     Head: Normocephalic and atraumatic.     Nose: Nose normal.     Mouth/Throat:     Mouth: Mucous membranes are moist.     Pharynx: Oropharynx is clear.  Eyes:     General: No scleral icterus.    Extraocular Movements: Extraocular movements intact.     Conjunctiva/sclera: Conjunctivae normal.  Cardiovascular:     Rate and Rhythm: Normal rate and regular rhythm.     Pulses: Normal pulses.     Heart sounds: Normal heart sounds.  Pulmonary:     Effort: Tachypnea present.     Breath sounds: Normal breath sounds.  Abdominal:     General: Bowel sounds are normal. There is no distension.     Palpations: Abdomen is soft. There is no mass.     Tenderness: There is no abdominal tenderness. There is no guarding or rebound.     Hernia: No hernia is present.  Musculoskeletal:        General: No swelling or tenderness.     Cervical back: Normal range of motion and neck supple.  Skin:    General: Skin is warm and dry.   Neurological:     Mental Status: He is lethargic and confused.     GI:  Lab Results: Recent Labs    06/26/20 0320 06/27/20 0420 06/28/20 0820  WBC 9.9 10.2 12.8*  HGB 9.5* 9.7* 9.8*  HCT 29.3* 29.9* 31.0*  PLT 240 247 253   BMET Recent Labs    06/26/20 0320 06/27/20 0420 06/28/20 0820  NA 140 141 144  K 3.8 3.5 4.1  CL 106 106 107  CO2 26 24 26   GLUCOSE 132* 109* 94  BUN 17 16 18   CREATININE 0.93 0.99 1.12  CALCIUM 8.8* 8.7* 9.0   LFT Recent Labs    06/28/20 0820  PROT 5.1*  ALBUMIN 2.3*  AST 57*  ALT 75*  ALKPHOS 53  BILITOT 0.8   PT/INR No results for input(s): LABPROT, INR in the last 72 hours.   Studies/Results: DG CHEST PORT 1 VIEW  Result Date: 06/27/2020 CLINICAL DATA:  PICC in place EXAM: PORTABLE CHEST 1 VIEW COMPARISON:  06/27/2020 FINDINGS: Left arm PICC terminates in the mid SVC. Subpleural patchy opacities in the lungs bilaterally, left lower lobe predominant, compatible with pneumonia in this patient with known COVID. No pleural effusion or pneumothorax. The heart is top-normal in size. IMPRESSION: Left arm PICC terminates in the mid SVC. Multifocal pneumonia in this patient with known COVID. Electronically Signed   By: 13/08/2019 M.D.   On: 06/27/2020 09:35   DG Chest Port 1 View  Result Date: 06/27/2020 CLINICAL DATA:  Shortness of breath, COVID-19 positive. EXAM: PORTABLE CHEST 1 VIEW COMPARISON:  June 23, 2020. FINDINGS: Stable cardiomegaly. Stable multiple airspace opacities are noted bilaterally consistent with multifocal pneumonia. No pneumothorax or pleural effusion is  noted. Bony thorax is unremarkable. IMPRESSION: Stable bilateral multifocal pneumonia. Electronically Signed   By: Lupita Raider M.D.   On: 06/27/2020 08:07   DG Abd Portable 1V  Result Date: 06/28/2020 CLINICAL DATA:  Nausea, vomiting blood, COVID-19, atrial fibrillation, coronary artery disease, hypertension, former smoker EXAM: PORTABLE ABDOMEN - 1 VIEW  COMPARISON:  Portable exam 1013 hours compared to CT abdomen and pelvis of 06/20/2018 FINDINGS: Scattered retained contrast in colonic diverticula and rectum. Nonobstructive bowel gas pattern. No bowel dilatation or bowel wall thickening. Bones demineralized with degenerative changes lumbar spine. IMPRESSION: Colonic diverticulosis. Electronically Signed   By: Ulyses Southward M.D.   On: 06/28/2020 10:25    Impression: Anemia, rectal bleeding x 1.  Initially reported as melena, but per RN, sounds like hematochezia.  Possibly diverticular bleeding vs AVMs vs polyps/mass.  Known diverticulosis per imaging. -Hgb 9.8, stable over the last 2 days, though decreased from 13.2 on admission  COVID-19, bacterial PNA, bacteremia  Encephalopathy  Plan: Continue Protonix drip for now, until it is confirmed that patient is having hematochezia and not melena.  Barring life-threatening bleeding, patient is not a candidate for endoscopic/colonoscopic intervention due to hypoxic respiratory failure, encephalopathy, and additional comorbidities.    If destabilizing bleeding (hematochezia) occurs, consider CT-A.  Due to encephalopathy, do not think the patient would be a candidate for nuclear bleeding scan.  Continue to hold Lovenox.  Continue to monitor H&H with transfusion as needed to maintain hemoglobin greater than 7.  [facility] will follow.   LOS: 6 days   Edrick Kins  PA-C 06/28/2020, 10:58 AM  Contact #  [phone]

## 2020-06-28 NOTE — Progress Notes (Signed)
   06/28/20 0544  Vitals  Temp 98.8 F (37.1 C)  Temp Source Axillary  BP (!) 123/104  MAP (mmHg) 112  BP Location Right Wrist  BP Method Automatic  Patient Position (if appropriate) Lying  Pulse Rate 64  Pulse Rate Source Monitor  ECG Heart Rate 64  Resp 18  Level of Consciousness  Level of Consciousness Alert  MEWS COLOR  MEWS Score Color Green  Oxygen Therapy  SpO2 99 %  O2 Device Nasal Cannula  O2 Flow Rate (L/min) 3 L/min  MEWS Score  MEWS Temp 0  MEWS Systolic 0  MEWS Pulse 0  MEWS RR 0  MEWS LOC 0  MEWS Score 0   Pt is currently lying in bed with no s/s of distress, CIWA is currently 8 down from 23 after an additional dose of ativan. In contact continuously with on call MD Chotiner throughout the night. The last dose of ativan eventually decreased the agitation and restlessness of the pt. Updated charge RN Joni Reining and will endorse to oncoming dayshift RN.

## 2020-06-29 ENCOUNTER — Inpatient Hospital Stay (HOSPITAL_COMMUNITY): Payer: Medicare Other

## 2020-06-29 ENCOUNTER — Inpatient Hospital Stay: Payer: Self-pay

## 2020-06-29 DIAGNOSIS — J9600 Acute respiratory failure, unspecified whether with hypoxia or hypercapnia: Secondary | ICD-10-CM

## 2020-06-29 DIAGNOSIS — U071 COVID-19: Secondary | ICD-10-CM | POA: Diagnosis not present

## 2020-06-29 DIAGNOSIS — J1282 Pneumonia due to coronavirus disease 2019: Secondary | ICD-10-CM | POA: Diagnosis not present

## 2020-06-29 LAB — POCT I-STAT 7, (LYTES, BLD GAS, ICA,H+H)
Acid-base deficit: 1 mmol/L (ref 0.0–2.0)
Acid-base deficit: 1 mmol/L (ref 0.0–2.0)
Bicarbonate: 29.2 mmol/L — ABNORMAL HIGH (ref 20.0–28.0)
Bicarbonate: 21.8 mmol/L (ref 20.0–28.0)
Calcium, Ion: 1.34 mmol/L (ref 1.15–1.40)
Calcium, Ion: 1.28 mmol/L (ref 1.15–1.40)
HCT: 27 % — ABNORMAL LOW (ref 39.0–52.0)
HCT: 26 % — ABNORMAL LOW (ref 39.0–52.0)
Hemoglobin: 9.2 g/dL — ABNORMAL LOW (ref 13.0–17.0)
Hemoglobin: 8.8 g/dL — ABNORMAL LOW (ref 13.0–17.0)
O2 Saturation: 100 %
O2 Saturation: 94 %
Patient temperature: 98.7
Patient temperature: 98
Potassium: 4.8 mmol/L (ref 3.5–5.1)
Potassium: 4.7 mmol/L (ref 3.5–5.1)
Sodium: 144 mmol/L (ref 135–145)
Sodium: 144 mmol/L (ref 135–145)
TCO2: 32 mmol/L (ref 22–32)
TCO2: 23 mmol/L (ref 22–32)
pCO2 arterial: 89.7 mmHg (ref 32.0–48.0)
pCO2 arterial: 26.6 mmHg — ABNORMAL LOW (ref 32.0–48.0)
pH, Arterial: 7.122 — CL (ref 7.350–7.450)
pH, Arterial: 7.52 — ABNORMAL HIGH (ref 7.350–7.450)
pO2, Arterial: 401 mmHg — ABNORMAL HIGH (ref 83.0–108.0)
pO2, Arterial: 60 mmHg — ABNORMAL LOW (ref 83.0–108.0)

## 2020-06-29 LAB — GLUCOSE, CAPILLARY
Glucose-Capillary: 109 mg/dL — ABNORMAL HIGH (ref 70–99)
Glucose-Capillary: 113 mg/dL — ABNORMAL HIGH (ref 70–99)
Glucose-Capillary: 66 mg/dL — ABNORMAL LOW (ref 70–99)
Glucose-Capillary: 69 mg/dL — ABNORMAL LOW (ref 70–99)
Glucose-Capillary: 70 mg/dL (ref 70–99)
Glucose-Capillary: 87 mg/dL (ref 70–99)
Glucose-Capillary: 89 mg/dL (ref 70–99)
Glucose-Capillary: 89 mg/dL (ref 70–99)

## 2020-06-29 LAB — COMPREHENSIVE METABOLIC PANEL
ALT: 66 U/L — ABNORMAL HIGH (ref 0–44)
AST: 36 U/L (ref 15–41)
Albumin: 2.3 g/dL — ABNORMAL LOW (ref 3.5–5.0)
Alkaline Phosphatase: 55 U/L (ref 38–126)
Anion gap: 5 (ref 5–15)
BUN: 14 mg/dL (ref 8–23)
CO2: 30 mmol/L (ref 22–32)
Calcium: 8.8 mg/dL — ABNORMAL LOW (ref 8.9–10.3)
Chloride: 110 mmol/L (ref 98–111)
Creatinine, Ser: 1.13 mg/dL (ref 0.61–1.24)
GFR, Estimated: >60 mL/min (ref >60)
Glucose, Bld: 127 mg/dL — ABNORMAL HIGH (ref 70–99)
Potassium: 4.5 mmol/L (ref 3.5–5.1)
Sodium: 145 mmol/L (ref 135–145)
Total Bilirubin: 0.5 mg/dL (ref 0.3–1.2)
Total Protein: 5.1 g/dL — ABNORMAL LOW (ref 6.5–8.1)

## 2020-06-29 LAB — CBC WITH DIFFERENTIAL/PLATELET
Abs Immature Granulocytes: 0.36 10*3/uL — ABNORMAL HIGH (ref 0.00–0.07)
Basophils Absolute: 0 10*3/uL (ref 0.0–0.1)
Basophils Relative: 0 %
Eosinophils Absolute: 0.2 10*3/uL (ref 0.0–0.5)
Eosinophils Relative: 1 %
HCT: 30.9 % — ABNORMAL LOW (ref 39.0–52.0)
Hemoglobin: 9.6 g/dL — ABNORMAL LOW (ref 13.0–17.0)
Immature Granulocytes: 2 %
Lymphocytes Relative: 11 %
Lymphs Abs: 2 10*3/uL (ref 0.7–4.0)
MCH: 34.8 pg — ABNORMAL HIGH (ref 26.0–34.0)
MCHC: 31.1 g/dL (ref 30.0–36.0)
MCV: 112 fL — ABNORMAL HIGH (ref 80.0–100.0)
Monocytes Absolute: 1.2 10*3/uL — ABNORMAL HIGH (ref 0.1–1.0)
Monocytes Relative: 7 %
Neutro Abs: 14.8 10*3/uL — ABNORMAL HIGH (ref 1.7–7.7)
Neutrophils Relative %: 79 %
Platelets: 260 10*3/uL (ref 150–400)
RBC: 2.76 MIL/uL — ABNORMAL LOW (ref 4.22–5.81)
RDW: 14.6 % (ref 11.5–15.5)
WBC: 18.6 10*3/uL — ABNORMAL HIGH (ref 4.0–10.5)
nRBC: 0.1 % (ref 0.0–0.2)

## 2020-06-29 LAB — BASIC METABOLIC PANEL
Anion gap: 9 (ref 5–15)
BUN: 16 mg/dL (ref 8–23)
CO2: 26 mmol/L (ref 22–32)
Calcium: 8.8 mg/dL — ABNORMAL LOW (ref 8.9–10.3)
Chloride: 109 mmol/L (ref 98–111)
Creatinine, Ser: 1.29 mg/dL — ABNORMAL HIGH (ref 0.61–1.24)
GFR, Estimated: 57 mL/min — ABNORMAL LOW (ref >60)
Glucose, Bld: 125 mg/dL — ABNORMAL HIGH (ref 70–99)
Potassium: 4.9 mmol/L (ref 3.5–5.1)
Sodium: 144 mmol/L (ref 135–145)

## 2020-06-29 LAB — CULTURE, BLOOD (ROUTINE X 2)
Culture: NO GROWTH
Culture: NO GROWTH
Special Requests: ADEQUATE
Special Requests: ADEQUATE

## 2020-06-29 LAB — CBC
HCT: 31.1 % — ABNORMAL LOW (ref 39.0–52.0)
Hemoglobin: 9.8 g/dL — ABNORMAL LOW (ref 13.0–17.0)
MCH: 34.6 pg — ABNORMAL HIGH (ref 26.0–34.0)
MCHC: 31.5 g/dL (ref 30.0–36.0)
MCV: 109.9 fL — ABNORMAL HIGH (ref 80.0–100.0)
Platelets: 287 10*3/uL (ref 150–400)
RBC: 2.83 MIL/uL — ABNORMAL LOW (ref 4.22–5.81)
RDW: 14.3 % (ref 11.5–15.5)
WBC: 20.2 10*3/uL — ABNORMAL HIGH (ref 4.0–10.5)
nRBC: 0.1 % (ref 0.0–0.2)

## 2020-06-29 LAB — HEMOGLOBIN A1C
Hgb A1c MFr Bld: 5.9 % — ABNORMAL HIGH (ref 4.8–5.6)
Mean Plasma Glucose: 122.63 mg/dL

## 2020-06-29 LAB — PHOSPHORUS: Phosphorus: 3.2 mg/dL (ref 2.5–4.6)

## 2020-06-29 LAB — HEMOGLOBIN AND HEMATOCRIT, BLOOD
HCT: 25.5 % — ABNORMAL LOW (ref 39.0–52.0)
Hemoglobin: 8 g/dL — ABNORMAL LOW (ref 13.0–17.0)

## 2020-06-29 LAB — MAGNESIUM
Magnesium: 1.8 mg/dL (ref 1.7–2.4)
Magnesium: 1.8 mg/dL (ref 1.7–2.4)

## 2020-06-29 LAB — TRIGLYCERIDES: Triglycerides: 120 mg/dL (ref <150)

## 2020-06-29 LAB — BRAIN NATRIURETIC PEPTIDE: B Natriuretic Peptide: 466.6 pg/mL — ABNORMAL HIGH (ref 0.0–100.0)

## 2020-06-29 LAB — PROCALCITONIN: Procalcitonin: <0.1 ng/mL

## 2020-06-29 LAB — C-REACTIVE PROTEIN: CRP: 0.9 mg/dL (ref <1.0)

## 2020-06-29 LAB — D-DIMER, QUANTITATIVE: D-Dimer, Quant: 0.67 ug/mL-FEU — ABNORMAL HIGH (ref 0.00–0.50)

## 2020-06-29 MED ORDER — NALOXONE HCL 0.4 MG/ML IJ SOLN
INTRAMUSCULAR | Status: AC
  Filled 2020-06-29: qty 1

## 2020-06-29 MED ORDER — DEXTROSE 50 % IV SOLN
INTRAVENOUS | Status: AC
  Administered 2020-06-29: 25 g via INTRAVENOUS
  Filled 2020-06-29: qty 50

## 2020-06-29 MED ORDER — SODIUM CHLORIDE 0.9 % IV SOLN
3.0000 g | Freq: Four times a day (QID) | INTRAVENOUS | Status: DC
  Administered 2020-06-29 – 2020-07-01 (×8): 3 g via INTRAVENOUS
  Filled 2020-06-29 (×8): qty 8

## 2020-06-29 MED ORDER — FENTANYL BOLUS VIA INFUSION
100.0000 ug | INTRAVENOUS | Status: DC | PRN
  Administered 2020-07-01 – 2020-07-02 (×2): 100 ug via INTRAVENOUS
  Filled 2020-06-29: qty 100

## 2020-06-29 MED ORDER — PROSOURCE TF PO LIQD
45.0000 mL | Freq: Four times a day (QID) | ORAL | Status: DC
  Administered 2020-06-29 – 2020-07-05 (×22): 45 mL
  Filled 2020-06-29 (×20): qty 45

## 2020-06-29 MED ORDER — MAGNESIUM SULFATE 2 GM/50ML IV SOLN
2.0000 g | Freq: Once | INTRAVENOUS | Status: AC
  Administered 2020-06-29: 2 g via INTRAVENOUS
  Filled 2020-06-29: qty 50

## 2020-06-29 MED ORDER — DEXTROSE 50 % IV SOLN: 25.0000 g | Freq: Once | INTRAVENOUS | Status: AC

## 2020-06-29 MED ORDER — FOLIC ACID 1 MG PO TABS
1.0000 mg | ORAL_TABLET | Freq: Every day | ORAL | Status: DC
  Administered 2020-06-29 – 2020-07-12 (×14): 1 mg
  Filled 2020-06-29 (×14): qty 1

## 2020-06-29 MED ORDER — ALBUMIN HUMAN 25 % IV SOLN
25.0000 g | Freq: Four times a day (QID) | INTRAVENOUS | Status: AC
  Administered 2020-06-29 (×4): 25 g via INTRAVENOUS
  Filled 2020-06-29 (×4): qty 100

## 2020-06-29 MED ORDER — MIDAZOLAM HCL 2 MG/2ML IJ SOLN: 5.0000 mg | INTRAMUSCULAR | Status: DC | PRN

## 2020-06-29 MED ORDER — FENTANYL CITRATE (PF) 100 MCG/2ML IJ SOLN
25.0000 ug | Freq: Once | INTRAMUSCULAR | Status: AC
  Administered 2020-06-29: 25 ug via INTRAVENOUS

## 2020-06-29 MED ORDER — GUAIFENESIN-DM 100-10 MG/5ML PO SYRP
10.0000 mL | ORAL_SOLUTION | ORAL | Status: DC | PRN
  Administered 2020-07-06 – 2020-07-08 (×2): 10 mL
  Filled 2020-06-29 (×4): qty 10

## 2020-06-29 MED ORDER — SODIUM CHLORIDE 0.9 % IV SOLN: 2.0000 g | INTRAVENOUS | Status: DC

## 2020-06-29 MED ORDER — SODIUM CHLORIDE 0.9 % IV SOLN
2.0000 g | Freq: Two times a day (BID) | INTRAVENOUS | Status: DC
  Administered 2020-06-29 – 2020-07-12 (×27): 2 g via INTRAVENOUS
  Filled 2020-06-29 (×26): qty 20

## 2020-06-29 MED ORDER — PROPOFOL 1000 MG/100ML IV EMUL
INTRAVENOUS | Status: AC
  Filled 2020-06-29: qty 100

## 2020-06-29 MED ORDER — FENTANYL BOLUS VIA INFUSION
25.0000 ug | INTRAVENOUS | Status: DC | PRN
  Filled 2020-06-29: qty 25

## 2020-06-29 MED ORDER — MIDAZOLAM HCL (PF) 5 MG/ML IJ SOLN: 5.0000 mg | INTRAMUSCULAR | Status: DC | PRN

## 2020-06-29 MED ORDER — ADULT MULTIVITAMIN W/MINERALS CH
1.0000 | ORAL_TABLET | Freq: Every day | ORAL | Status: DC
  Administered 2020-06-29 – 2020-07-12 (×14): 1
  Filled 2020-06-29 (×14): qty 1

## 2020-06-29 MED ORDER — QUETIAPINE FUMARATE 50 MG PO TABS
50.0000 mg | ORAL_TABLET | Freq: Every day | ORAL | Status: DC
  Administered 2020-06-29 – 2020-07-05 (×7): 50 mg
  Filled 2020-06-29 (×7): qty 1

## 2020-06-29 MED ORDER — FENTANYL 2500MCG IN NS 250ML (10MCG/ML) PREMIX INFUSION
25.0000 ug/h | INTRAVENOUS | Status: DC
  Administered 2020-06-29: 25 ug/h via INTRAVENOUS
  Administered 2020-06-29 – 2020-06-30 (×3): 150 ug/h via INTRAVENOUS
  Administered 2020-07-01 (×2): 200 ug/h via INTRAVENOUS
  Administered 2020-07-02 (×2): 50 ug/h via INTRAVENOUS
  Filled 2020-06-29 (×9): qty 250

## 2020-06-29 MED ORDER — DEXTROSE 10 % IV SOLN
INTRAVENOUS | Status: AC
  Administered 2020-06-29: 50 mL/h via INTRAVENOUS
  Filled 2020-06-29: qty 1000

## 2020-06-29 MED ORDER — SODIUM CHLORIDE 0.9% FLUSH
10.0000 mL | Freq: Two times a day (BID) | INTRAVENOUS | Status: DC
  Administered 2020-06-29 – 2020-07-04 (×11): 10 mL
  Administered 2020-07-05: 20 mL
  Administered 2020-07-05 – 2020-07-06 (×2): 10 mL
  Administered 2020-07-06: 20 mL
  Administered 2020-07-07 – 2020-07-13 (×13): 10 mL

## 2020-06-29 MED ORDER — PANTOPRAZOLE SODIUM 40 MG IV SOLR
40.0000 mg | Freq: Two times a day (BID) | INTRAVENOUS | Status: DC
  Administered 2020-06-29 (×2): 40 mg via INTRAVENOUS
  Filled 2020-06-29 (×2): qty 40

## 2020-06-29 MED ORDER — ACETAMINOPHEN 325 MG PO TABS: 650.0000 mg | ORAL_TABLET | Freq: Four times a day (QID) | ORAL | Status: DC | PRN

## 2020-06-29 MED ORDER — VITAL HIGH PROTEIN PO LIQD
1000.0000 mL | ORAL | Status: DC
  Administered 2020-06-29: 1000 mL

## 2020-06-29 MED ORDER — FENTANYL BOLUS VIA INFUSION
50.0000 ug | INTRAVENOUS | Status: DC | PRN
  Filled 2020-06-29: qty 50

## 2020-06-29 MED ORDER — VITAL 1.5 CAL PO LIQD
1000.0000 mL | ORAL | Status: DC
  Administered 2020-06-29 – 2020-07-04 (×5): 1000 mL
  Filled 2020-06-29 (×2): qty 1000

## 2020-06-29 MED ORDER — ZINC SULFATE 220 (50 ZN) MG PO CAPS
220.0000 mg | ORAL_CAPSULE | Freq: Every day | ORAL | Status: DC
  Administered 2020-06-29 – 2020-07-01 (×3): 220 mg
  Filled 2020-06-29 (×3): qty 1

## 2020-06-29 MED ORDER — SODIUM CHLORIDE 0.9% FLUSH: 10.0000 mL | INTRAVENOUS | Status: DC | PRN

## 2020-06-29 MED ORDER — INSULIN ASPART 100 UNIT/ML ~~LOC~~ SOLN
0.0000 [IU] | SUBCUTANEOUS | Status: DC
  Administered 2020-07-05 (×2): 1 [IU] via SUBCUTANEOUS

## 2020-06-29 MED ORDER — PROPOFOL 1000 MG/100ML IV EMUL
5.0000 ug/kg/min | INTRAVENOUS | Status: DC
  Administered 2020-06-29: 30 ug/kg/min via INTRAVENOUS
  Administered 2020-06-29: 5 ug/kg/min via INTRAVENOUS
  Administered 2020-06-30: 10 ug/kg/min via INTRAVENOUS
  Administered 2020-06-30: 5 ug/kg/min via INTRAVENOUS
  Administered 2020-07-01: 10 ug/kg/min via INTRAVENOUS
  Administered 2020-07-01: 15 ug/kg/min via INTRAVENOUS
  Filled 2020-06-29 (×5): qty 100

## 2020-06-29 MED ORDER — ALLOPURINOL 300 MG PO TABS
300.0000 mg | ORAL_TABLET | Freq: Every day | ORAL | Status: DC
  Administered 2020-06-29 – 2020-07-11 (×12): 300 mg
  Filled 2020-06-29 (×2): qty 3
  Filled 2020-06-29 (×2): qty 1
  Filled 2020-06-29: qty 3
  Filled 2020-06-29 (×5): qty 1
  Filled 2020-06-29: qty 3
  Filled 2020-06-29: qty 1
  Filled 2020-06-29: qty 3
  Filled 2020-06-29 (×2): qty 1

## 2020-06-29 NOTE — Progress Notes (Signed)
Unable to obtain any sputum for culture, trap left in place, RN aware.

## 2020-06-29 NOTE — Progress Notes (Addendum)
OT Cancellation Note  Patient Details Name: Gregory Wheeler MRN: [id number] DOB: 1941/08/05   Cancelled Treatment:    Reason Eval/Treat Not Completed: Medical issues which prohibited therapy (Pt has been transferred to ICU and intubated/sedated. Will follow and progress as medically able.)  Dalphine Handing, MSOT, OTR/L Acute Rehabilitation Services [facility] Office Number: [phone] Pager: [phone]  Dalphine Handing 06/29/2020, 11:07 AM

## 2020-06-29 NOTE — Significant Event (Signed)
Rapid Response Event Note   Reason for Call :  Called for desaturation-70s.  Pt was seen earlier in night. At that time his SpO2 was 97-99 on 3L [place].   Initial Focused Assessment:  Pt laying in bed with eyes closed, unresponsive, agonally breathing, gray and dusky.  Pt did have palpable pulse present. HR-60, SBP-60s, SpO2-70% on 15L HFNC and NRB.  RT began to bag pt on 100% FiO2, NS started at 999, and narcan 0.4 was administered(pt had been given morphine earlier in the night). Narcan had no effect and HR began to drop to 40s with pulse still palpable. 1amp atropine given with resulting HR-100s, SBP still 60s. Respiratory arrest Code blue initiated. Levophed drip mixed and hung at . Pt was intubated by RT. Large amount dark dried blood seen occluding airway during intubation. Pt was transported to 3M13. Once there multiple large blood clots suctioned out of ETT by RT.     Interventions:  BVM NS @ 999 Narcan 0.4 x 1  Atropine 1 amp x 1 Code Blue initiatied Levophed gtt Intubation Tx to 3M13  Please see Code Blue sheet for further details.  Plan of Care:  Pt txed to 3M13   Event Summary:   MD Notified: Chotiner notified by bedside staff and came to beside Call Time: 0255 Arrival Time:0302 End [id number]  Terrilyn Saver, RN

## 2020-06-29 NOTE — Progress Notes (Signed)
   06/29/20 0309  Clinical Encounter Type  Visited With Patient  Visit Type Code  Referral From Nurse  Consult/Referral To Chaplain   Chaplain responded to Code Blue. Pt unavailable and no family present. No current need for chaplain. Chaplain remains available as needed.  This note was prepared by Chaplain Resident, Tacy Learn, MDiv. Chaplain remains available as needed through the on-call pager: [phone].

## 2020-06-29 NOTE — Progress Notes (Signed)
Sputum collected and sent to lab 

## 2020-06-29 NOTE — Progress Notes (Signed)
Bedside sitter notified RN that pts oxygen saturation was dropping into the lower 80%, upon entering room pt was pale, diaphoretic, and in respiratory distress. Femoral pulse palpated at this time and Pt placed on 15L [place] along with 15L Nonrebreat[place]. Rapid Response called to bedside. Oxygen saturation dropping into the low 70s. Rapid Response RN, Mindy, at bedside, code blue called at 0309. Refer to code blue flowsheet.

## 2020-06-29 NOTE — Progress Notes (Signed)
Called by RN that rapid response called for Mr. Holzhauer due to respiratory arrest.  Pt was admitted 6 days ago with COVID pneumonia. 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 ENT saw yesterday for oral lesions.  Went to bedside. Pt with low BP and weak but palpable pulse.  HR dropped to the 50's and given dose of atropine. HR improved to 90-100.  BP was low and started on levophed.  Pt was intubated by RT.  Vitals stabilized with interventions. Pt never lost pulse or required chest compressions.   PCCM notified and ground team coming to evaluate and transfer to ICU.

## 2020-06-29 NOTE — Final Consult Note (Signed)
Subjective: Patient now intubated in ICU.  ID following.  I was asked to see by Dr. Thedore Mins for management of oral mucositis.  Objective: Vital signs in last 24 hours: Temp:  [96.6 F (35.9 C)-98.7 F (37.1 C)] 96.6 F (35.9 C) (11/03 1132) Pulse Rate:  [48-109] 61 (11/03 1300) Resp:  [13-28] 22 (11/03 1300) BP: (67-179)/(42-135) 137/42 (11/03 1300) SpO2:  [69 %-100 %] 95 % (11/03 1300) FiO2 (%):  [40 %-60 %] 40 % (11/03 1132) Wt Readings from Last 1 Encounters:  06/23/20 111 kg    Intake/Output from previous day: 11/02 0701 - 11/03 0700 In: 991.1 [I.V.:44.4; IV Piggyback:946.7] Out: 1900 [Urine:1900] Intake/Output this shift: Total I/O In: 553.5 [I.V.:242.8; IV Piggyback:310.7] Out: 75 [Urine:75]  No necrosis in oral cavity or nasal cavity - mucosa viable. Previously noted mucosal changes much better today  Recent Labs    06/29/20 0126 06/29/20 0352 06/29/20 0645 06/29/20 1235  WBC 18.6*  --  20.2*  --   HGB 9.6*   < > 9.8* 8.0*  HCT 30.9*   < > 31.1* 25.5*  PLT 260  --  287  --    < > = values in this interval not displayed.    Recent Labs    06/29/20 0126 06/29/20 0352 06/29/20 0633 06/29/20 0645  NA 145   < > 144 144  K 4.5   < > 4.7 4.9  CL 110  --   --  109  CO2 30  --   --  26  GLUCOSE 127*  --   --  125*  BUN 14  --   --  16  CREATININE 1.13  --   --  1.29*  CALCIUM 8.8*  --   --  8.8*   < > = values in this interval not displayed.    Medications: I have reviewed the patient's current medications.  Assessment/Plan: Mucositis - likely secondary to multiple causes in a patient wearing dentures  Seems to be getting better.  Continue current therapy.  No evidence of necrosis suggestive of invasive fungal infection.   LOS: 7 days   Rejeana Brock 06/29/2020, 2:45 PM

## 2020-06-29 NOTE — Progress Notes (Signed)
Called to pt's room dt desaturating to 70's with 15 L NRB on. Pt diaphoretic, pale and dusky appearring. SBP 60's, palpable femoral pulse. Rapid response called at 0255 to come and reassess pt. Started bagging pt at 0305. Gave narcan and administered NS bolus at 0306. No improvement in pt, respiratory code blue called at 0309. Pt intubated at 0318 and transferred to 3M13 shortly after.   Attempted to call son Christen Bame and update on pt's status.   Report given to Esau Grew, RN

## 2020-06-29 NOTE — Progress Notes (Signed)
Made Dr. Judeth Horn aware of patients small, bright red, bloody stool with clots. Will continue to monitor closely.

## 2020-06-29 NOTE — Progress Notes (Signed)
[facility]   Reason for visit: Follow up on bacteremia  Interval History: now intubated in the ICU.  WBC up to 20.2.  Broadened to Amp/sulbactam.   Ceftriaxone day 6 Amp +/- sulbactam day 7   Physical Exam: Constitutional:  Vitals:   06/29/20 1100 06/29/20 1132  BP: (!) 142/49 (!) 127/46  Pulse: (!) 53 (!) 58  Resp: 13 (!) 22  Temp:  (!) 96.6 F (35.9 C)  SpO2: 100% 100%   patient appears in NAD Eyes: anicteric HENT: + ET Respiratory: respiratory effort on vent Cardiovascular: RRR   Review of Systems: Unable to be assessed due to patient factors  Lab Results  Component Value Date   WBC 20.2 (H) 06/29/2020   HGB 9.8 (L) 06/29/2020   HCT 31.1 (L) 06/29/2020   MCV 109.9 (H) 06/29/2020   PLT 287 06/29/2020    Lab Results  Component Value Date   CREATININE 1.29 (H) 06/29/2020   BUN 16 06/29/2020   NA 144 06/29/2020   K 4.9 06/29/2020   CL 109 06/29/2020   CO2 26 06/29/2020    Lab Results  Component Value Date   ALT 66 (H) 06/29/2020   AST 36 06/29/2020   ALKPHOS 55 06/29/2020     Microbiology: Recent Results (from the past 240 hour(s))  Blood Culture (routine x 2)     Status: Abnormal   Collection Time: 06/22/20 11:41 AM   Specimen: Right Antecubital; Blood  Result Value Ref Range Status   Specimen Description   Final    RIGHT ANTECUBITAL Performed at [facility], [address] [place], [place] [address]    Special Requests   Final    BOTTLES DRAWN AEROBIC AND ANAEROBIC Blood Culture adequate volume Performed at [facility], [address] [place], [place] [address]    Culture  Setup Time   Final    IN BOTH AEROBIC AND ANAEROBIC BOTTLES GRAM POSITIVE COCCI Organism ID to follow CRITICAL RESULT CALLED TO, READ BACK BY AND VERIFIED WITH: Lana Fish PharmD 8:25 06/23/20 ([email or web address]) Performed at [facility], [address], [place], [place] [address]    Culture ENTEROCOCCUS FAECALIS (A)   Final   Report Status 06/25/2020 FINAL  Final   Organism ID, Bacteria ENTEROCOCCUS FAECALIS  Final      Susceptibility   Enterococcus faecalis - MIC*    AMPICILLIN <=2 SENSITIVE Sensitive     VANCOMYCIN 2 SENSITIVE Sensitive     GENTAMICIN SYNERGY SENSITIVE Sensitive     * ENTEROCOCCUS FAECALIS  Blood Culture ID Panel (Reflexed)     Status: Abnormal   Collection Time: 06/22/20 11:41 AM  Result Value Ref Range Status   Enterococcus faecalis DETECTED (A) NOT DETECTED Final    Comment: CRITICAL RESULT CALLED TO, READ BACK BY AND VERIFIED WITH: Lana Fish PharmD 8:25 06/23/20 ([email or web address])    Enterococcus Faecium NOT DETECTED NOT DETECTED Final   Listeria monocytogenes NOT DETECTED NOT DETECTED Final   Staphylococcus species NOT DETECTED NOT DETECTED Final   Staphylococcus aureus (BCID) NOT DETECTED NOT DETECTED Final   Staphylococcus epidermidis NOT DETECTED NOT DETECTED Final   Staphylococcus lugdunensis NOT DETECTED NOT DETECTED Final   Streptococcus species NOT DETECTED NOT DETECTED Final   Streptococcus agalactiae NOT DETECTED NOT DETECTED Final   Streptococcus pneumoniae NOT DETECTED NOT DETECTED Final   Streptococcus pyogenes NOT DETECTED NOT DETECTED Final   A.calcoaceticus-baumannii NOT DETECTED NOT DETECTED Final  Bacteroides fragilis NOT DETECTED NOT DETECTED Final   Enterobacterales NOT DETECTED NOT DETECTED Final   Enterobacter cloacae complex NOT DETECTED NOT DETECTED Final   Escherichia coli NOT DETECTED NOT DETECTED Final   Klebsiella aerogenes NOT DETECTED NOT DETECTED Final   Klebsiella oxytoca NOT DETECTED NOT DETECTED Final   Klebsiella pneumoniae NOT DETECTED NOT DETECTED Final   Proteus species NOT DETECTED NOT DETECTED Final   Salmonella species NOT DETECTED NOT DETECTED Final   Serratia marcescens NOT DETECTED NOT DETECTED Final   Haemophilus influenzae NOT DETECTED NOT DETECTED Final   Neisseria meningitidis NOT DETECTED NOT DETECTED Final   Pseudomonas  aeruginosa NOT DETECTED NOT DETECTED Final   Stenotrophomonas maltophilia NOT DETECTED NOT DETECTED Final   Candida albicans NOT DETECTED NOT DETECTED Final   Candida auris NOT DETECTED NOT DETECTED Final   Candida glabrata NOT DETECTED NOT DETECTED Final   Candida krusei NOT DETECTED NOT DETECTED Final   Candida parapsilosis NOT DETECTED NOT DETECTED Final   Candida tropicalis NOT DETECTED NOT DETECTED Final   Cryptococcus neoformans/gattii NOT DETECTED NOT DETECTED Final   Vancomycin resistance NOT DETECTED NOT DETECTED Final    Comment: Performed at [facility], [address] [place], [place] [address]  Respiratory Panel by RT PCR (Flu A&B, Covid) - Nasopharyngeal Swab     Status: Abnormal   Collection Time: 06/22/20 11:43 AM   Specimen: Nasopharyngeal Swab  Result Value Ref Range Status   SARS Coronavirus 2 by RT PCR POSITIVE (A) NEGATIVE Final    Comment: RESULT CALLED TO, READ BACK BY AND VERIFIED WITH:  COBLE SAM,RN @1323  ON 06/22/2020, [email or web address].P (NOTE) SARS-CoV-2 target nucleic acids are DETECTED.  SARS-CoV-2 RNA is generally detectable in upper respiratory specimens  during the acute phase of infection. Positive results are indicative of the presence of the identified virus, but do not rule out bacterial infection or co-infection with other pathogens not detected by the test. Clinical correlation with patient history and other diagnostic information is necessary to determine patient infection status. The expected result is Negative.  Fact Sheet for Patients:  https://www.moore.com/https://www.fda.gov/media/142436/download  Fact Sheet for Healthcare Providers: https://www.young.biz/https://www.fda.gov/media/142435/download  This test is not yet approved or cleared by the [place] FDA and  has been authorized for detection and/or diagnosis of SARS-CoV-2 by FDA under an Emergency Use Authorization (EUA).  This EUA will remain in effect (meaning this  test can be used) for the duration of  the  COVID-19 declaration under Section 564(b)(1) of the Act, 21 U.S.C. section 360bbb-3(b)(1), unless the authorization is terminated or revoked sooner.      Influenza A by PCR NEGATIVE NEGATIVE Final   Influenza B by PCR NEGATIVE NEGATIVE Final    Comment: (NOTE) The Xpert Xpress SARS-CoV-2/FLU/RSV assay is intended as an aid in  the diagnosis of influenza from Nasopharyngeal swab specimens and  should not be used as a sole basis for treatment. Nasal washings and  aspirates are unacceptable for Xpert Xpress SARS-CoV-2/FLU/RSV  testing.  Fact Sheet for Patients: https://www.moore.com/https://www.fda.gov/media/142436/download  Fact Sheet for Healthcare Providers: https://www.young.biz/https://www.fda.gov/media/142435/download  This test is not yet approved or cleared by the [place] FDA and  has been authorized for detection and/or diagnosis of SARS-CoV-2 by  FDA under an Emergency Use Authorization (EUA). This EUA will remain  in effect (meaning this test can be used) for the duration of the  Covid-19 declaration under Section 564(b)(1) of the Act, 21  U.S.C. section 360bbb-3(b)(1), unless the authorization is  terminated or  revoked. Performed at [facility], [address] [place], [place] [address]   Blood Culture (routine x 2)     Status: Abnormal   Collection Time: 06/22/20 11:58 AM   Specimen: Left Antecubital; Blood  Result Value Ref Range Status   Specimen Description   Final    LEFT ANTECUBITAL Performed at [facility], [address] [place], [place] [address]    Special Requests   Final    BOTTLES DRAWN AEROBIC AND ANAEROBIC Blood Culture adequate volume Performed at [facility], [address] [place], [place] [address]    Culture  Setup Time   Final    IN BOTH AEROBIC AND ANAEROBIC BOTTLES GRAM POSITIVE COCCI CRITICAL VALUE NOTED.  VALUE IS CONSISTENT WITH PREVIOUSLY REPORTED AND CALLED VALUE.    Culture (A)  Final    ENTEROCOCCUS FAECALIS SUSCEPTIBILITIES  PERFORMED ON PREVIOUS CULTURE WITHIN THE LAST 5 DAYS. Performed at [facility], [address] [place], [place] [address]    Report Status 06/25/2020 FINAL  Final  MRSA PCR Screening     Status: None   Collection Time: 06/23/20  8:08 AM   Specimen: Nasal Mucosa; Nasopharyngeal  Result Value Ref Range Status   MRSA by PCR NEGATIVE NEGATIVE Final    Comment:        The GeneXpert MRSA Assay (FDA approved for NASAL specimens only), is one component of a comprehensive MRSA colonization surveillance program. It is not intended to diagnose MRSA infection nor to guide or monitor treatment for MRSA infections. Performed at [facility], [address], [place], [place] [address]   Urine culture     Status: None   Collection Time: 06/23/20 12:01 PM   Specimen: Urine, Clean Catch  Result Value Ref Range Status   Specimen Description   Final    URINE, CLEAN CATCH Performed at [facility], [address] [place], [place] [address]    Special Requests   Final    NONE Performed at [facility], [address] [place], [place] [address]    Culture   Final    NO GROWTH Performed at [facility], [address], [place], [place] [address]    Report Status 06/24/2020 FINAL  Final  Culture, blood (routine x 2)     Status: None   Collection Time: 06/24/20  6:50 AM   Specimen: BLOOD LEFT HAND  Result Value Ref Range Status   Specimen Description BLOOD LEFT HAND  Final   Special Requests   Final    BOTTLES DRAWN AEROBIC AND ANAEROBIC Blood Culture adequate volume   Culture   Final    NO GROWTH 5 DAYS Performed at [facility], [address] [place], [place] [address]    Report Status 06/29/2020 FINAL  Final  Culture, blood (routine x 2)     Status: None   Collection Time: 06/24/20  6:50 AM   Specimen: BLOOD RIGHT HAND  Result Value Ref Range Status   Specimen Description BLOOD RIGHT HAND  Final   Special Requests   Final    BOTTLES  DRAWN AEROBIC AND ANAEROBIC Blood Culture adequate volume   Culture   Final    NO GROWTH 5 DAYS Performed at [facility], [address] [place], [place] [address]    Report Status 06/29/2020 FINAL  Final    Impression/Plan:  1. 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  Will continue.   2.  COVID pneumonia - s/p intubation with agonal respirations yesterday and in the ICU.  Continuing with treatment.   3.  Aspiration - may have some pneumonitis.  Will transition back to amp without sulbactam tomorrow.

## 2020-06-29 NOTE — Progress Notes (Signed)
Patient belongings at bedside ring placed in specimen cup next to mirror

## 2020-06-29 NOTE — Progress Notes (Signed)
eLink Physician-Brief Progress Note Patient Name: Gregory Wheeler DOB: 1941-07-06 MRN: [id number]   Date of Service  06/29/2020  HPI/Events of Note  Nursing concerns: 1. Tremulous when stimulated - This is likely related to anoxic brain injury and 2. Unresponsive since respiratory arrest yesterday.   eICU Interventions  Plan: 1. Head CT Scan w/o contrast STAT.     Intervention Category Major Interventions: Change in mental status - evaluation and management  Sharita Bienaime Eugene 06/29/2020, 11:23 PM

## 2020-06-29 NOTE — Progress Notes (Signed)
PT Cancellation Note  Patient Details Name: Gregory Wheeler MRN: [id number] DOB: 1941/08/24   Cancelled Treatment:    Reason Eval/Treat Not Completed: Medical issues which prohibited therapy.  Pt moved to ICU and is now intubated and sedated.  PT to hold today and tomorrow.  Will check back on medical stability on Friday 11/5.    Thanks,  Corinna Capra, PT, DPT  Acute Rehabilitation [phone] pager #[phone] office      [name] B Aarik Blank 06/29/2020, 11:15 AM

## 2020-06-29 NOTE — Progress Notes (Signed)
Speech Language Pathology Discharge Patient Details Name: Gregory Wheeler MRN: [id number] DOB: [birth year] Today's Date: 06/29/2020 Time:  -     Patient discharged from SLP services secondary to medical decline - will need to re-order SLP to resume therapy services. Code blue yesterday- now intubate. Will discharge  Please see latest therapy progress note for current level of functioning and progress toward goals.    Progress and discharge plan discussed with patient and/or caregiver: Patient unable to participate in discharge planning and no caregivers available  GO     Royce Macadamia 06/29/2020, 8:23 AM   Breck Coons Lonell Face.Ed Nurse, children's [phone] Office [phone]

## 2020-06-29 NOTE — Progress Notes (Signed)
-  Patient not seen.  Chart reviewed. Yesterday's events noted.  Apparently patient had respiratory failure requiring intubation yesterday.  Had 2 episodes of rectal bleeding yesterday evening.  Hemoglobin is stable.  Discussed with RN, no bleeding episode since 4 AM this morning.  -Recommend to continue Protonix drip for now. -Continue to hold Lovenox.  Monitor H&H. -If bleeding, may consider CTA abdomen pelvis for further evaluation. -GI will follow from distance.  Kathi Der MD, FACP 06/29/2020, 9:10 AM  Contact #  [phone]

## 2020-06-29 NOTE — Consult Note (Signed)
NAME:  Gregory Wheeler, MRN:  [id number], DOB:  1941-03-06, LOS: 7 ADMISSION DATE:  06/22/2020, CONSULTATION DATE:  06/29/20 REFERRING MD:  Thedore Mins  CHIEF COMPLAINT:  Respiratory Arrest   Brief History   Gregory Wheeler is a 79 y.o. male who was admitted 10/27 with COVID PNA and possible aspiration PNA.  Started on remdesivir, decadron, unasyn.  Also started on CIWA protocol for DT's.  Early AM 11/3, had respiratory arrest and was subsequently intubated and transferred to the ICU.  History of present illness   Pt is encephalopathic; therefore, this HPI is obtained from chart review.  Gregory Wheeler is a 79 y.o. male who has a PMH as outlined below.  He was admitted on 10/27 with COVID PNA after testing positive 10 days prior per notes (unsure when initial positive test was) and possible aspiration PNA.  He was admitted by [facility] and was started on decadron and remdesivir along with CIWA protocol given his hx of heavy EtOH use and unasyn for possible aspiration PNA.  He was vaccinated with Pfizer around April 2021. Blood cultures noteably positive for enterococcus.  Seen by ID who recommends amp + ceftriaxone x 6 weeks.  11/2 he was evaluated by GI for GIB.  Lovenox was stopped and protonix gtt started.  CTA was recommended if bleeding persisted. 11/2 was also evaluated by ENT for oral denture mucositis.  He was started on diflucan and unasyn added back.  During early AM hours 11/3, he was noted to have worsening hypoxia which progressed into agonal respirations.  SBP dropped to 60's; however, he never lost pulses.  He was bagged by RRT and was administered narcan after receiving morphine earlier in the night.  He had no response to narcan.  Code Blue was called for respiratory arrest.  He was intubated by RT and was transferred to 33M.  Upon arrival, multiple dried clots were suctioned out of his mouth.  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  Significant Hospital Events   10/27 > admit. 11/2 > GI consult for GIB, ENT consult for mucositis. 11/3 > respiratory arrest, intubated and transferred to ICU.  Consults:  GI, ENT, PCCM.  Procedures:  PICC 11/1 >  ETT 11/3 >   Significant Diagnostic Tests:  CXR 11/3 ETT 3 cm above carina, bilateral peripheral > central infiltrates  Micro Data:  Flu 10/27 > neg. COVID 10/27 > pos. Blood 10/27 > enterococcus. Urine 10/28 > neg. Blood 10/29 >   Antimicrobials:  Azithro 10/27 > 10/27 Ceftriaxone 10/27 >  Amp 10/28 > .  11/1 > Unasyn 10/28 >   Vanc 11/2 >   Interim history/subjective:  Consulted  Objective:  Blood pressure (!) 67/44, pulse 69, temperature (!) 97.1 F (36.2 C), temperature source Axillary, resp. rate 19, height 5\' 11"  (1.803 m), weight 111 kg, SpO2 (!) 69 %.    Vent Mode: PRVC FiO2 (%):  [40 %]  40 % Set Rate:  [30 bmp] 30 bmp Vt Set:  [600 mL] 600 mL PEEP:  [5 cmH20] 5 cmH20 Plateau Pressure:  [15 cmH20] 15 cmH20   Intake/Output Summary (Last 24 hours) at 06/29/2020 0409 Last data filed at 06/29/2020 0000 Gross per 24 hour  Intake 600 ml  Output 1900 ml  Net -1300 ml   Filed Weights   06/23/20 0814  Weight: 111 kg    Examination: General: pale elderly man in mild distress on  vent Neuro: moving all 4 ext but nonpurposeful, sedated HEENT: ETT in place, dry MM, copious clots suctioned from oropharynx and ETT Cardiovascular: RRR, ext warm Lungs: Scattered rhonci, triggering vent Abdomen: soft, hypoactive BS Musculoskeletal: diffuse anasarca and arthritic changes Skin: slightly pale  Assessment & Plan:  Acute hypoxemic respiratory failure in setting of worsening delirium and likely aspiration event. - Mechanical ventilation, VAP prevention bundle - AM ABG with adjustments made by RT for respiratory insufficiency - Check tracheal aspirate - Hold AC  Acute metabolic encephalopathy with hx of heavy alcohol abuse and question of DTs vs. ICU-associated delirium - Prop+fentanyl, did not react well to benzodiazepines although reportedly heavy drinker at home, lighten as able  Question GIB, severe stomatitis associated with dentures, hemoptysis- everything consistent with stomatitis, aspirated blood mimicking GIB, H/H stable - Monitor H/H - PPI to BID - Monitor ETT output - NGT to LIS - fluconazole with duration TBD  Transient hypotension- question vagal or inflammatory related to aspiration event - levophed titrated to MAP 65, albumin 100g  Enterococcal bacteremia 10/27, 10/29 cx clear - Since patient now intubated, please ask Cardsmaster to schedule TEE so we can shorten duration of abx - Rocephin and ampicillin fine for now - I do not see a role for vanc at this time  Background of breakthrough COVID 19 infection- s/p remdesivir and steroids.  Do not think this is contributing much to current picture.  Hx CAD, RBBB, Mobitz 1 AVB, Afib- hold AC in light of tongue vs. GI vs. Airway bleeding, hold BB in light of hypotension.  Mag x 2g for hypomag, monitor electrolytes - Rechallenge with AC at later tdate  Best Practice:  Diet: NPO Pain/Anxiety/Delirium protocol (if indicated): see above VAP protocol (if indicated): ordered DVT prophylaxis: SCDs GI  prophylaxis: PPI Glucose control: SSI Mobility: BR Code Status: Full Family Communication:  Called and updated son Jearld PiesRonnie Bango [phone] 06/29/20 4:50AM Disposition: ICU pending vent liberation  Labs   CBC: Recent Labs  Lab 06/27/20 0420 06/27/20 0420 06/28/20 0820 06/28/20 1125 06/28/20 2020 06/29/20 0126 06/29/20 0352  WBC 10.2  --  12.8* 10.7* 11.5* 18.6*  --   NEUTROABS 9.3*  --  10.8* 9.0* 9.7* 14.8*  --   HGB 9.7*   < > 9.8* 9.3* 8.5* 9.6* 9.2*  HCT 29.9*   < > 31.0* 29.1* 26.3* 30.9* 27.0*  MCV 104.2*  --  106.2* 106.2* 108.2* 112.0*  --   PLT 247  --  253 221 222 260  --    < > = values in this interval not displayed.   Basic Metabolic Panel: Recent Labs  Lab 06/23/20 0140 06/24/20 0650 06/25/20 0133 06/25/20 0133 06/26/20 0320 06/26/20 0320 06/27/20 0420 06/28/20 0820 06/28/20 2020 06/29/20 0126 06/29/20 0352  NA 135   < > 138   < > 140   < > 141 144 142 145 144  K 3.6   < > 3.3*   < > 3.8   < > 3.5  4.1 3.9 4.5 4.8  CL 103   < > 103   < > 106  --  106 107 107 110  --   CO2 23   < > 25   < > 26  --  24 26 27 30   --   GLUCOSE 144*   < > 130*   < > 132*  --  109* 94 89 127*  --   BUN 22   < > 19   < > 17  --  16 18 16 14   --   CREATININE 1.22   < > 1.05   < > 0.93  --  0.99 1.12 1.14 1.13  --   CALCIUM 7.9*   < > 8.6*   < > 8.8*  --  8.7* 9.0 8.5* 8.8*  --   MG 1.0*   < > 1.3*  --  1.5*  --  1.5* 1.8  --  1.8  --   PHOS 3.0  --   --   --  3.1  --   --   --   --   --   --    < > = values in this interval not displayed.   GFR: Estimated Creatinine Clearance: 68.3 mL/min (by C-G formula based on SCr of 1.13 mg/dL). Recent Labs  Lab 06/22/20 1143 06/22/20 1405 06/23/20 0140 06/26/20 0320 06/26/20 0320 06/27/20 0420 06/27/20 0420 06/28/20 0820 06/28/20 1125 06/28/20 2020 06/29/20 0126  PROCALCITON 0.86  --    < > <0.10  --  <0.10  --  <0.10  --   --  <0.10  WBC 17.3*  --    < > 9.9   < > 10.2   < > 12.8* 10.7* 11.5* 18.6*  LATICACIDVEN 2.4*  1.7  --   --   --   --   --   --   --   --   --    < > = values in this interval not displayed.   Liver Function Tests: Recent Labs  Lab 06/25/20 0133 06/26/20 0320 06/27/20 0420 06/28/20 0820 06/29/20 0126  AST 62* 77* 76* 57* 36  ALT 41 67* 82* 75* 66*  ALKPHOS 47 46 46 53 55  BILITOT 0.6 0.5 0.8 0.8 0.5  PROT 5.2* 4.9* 4.9* 5.1* 5.1*  ALBUMIN 2.0* 2.0* 2.2* 2.3* 2.3*   No results for input(s): LIPASE, AMYLASE in the last 168 hours. Recent Labs  Lab 06/22/20 2235  AMMONIA 11   ABG    Component Value Date/Time   PHART 7.122 (LL) 06/29/2020 0352   PCO2ART 89.7 (HH) 06/29/2020 0352   PO2ART 401 (H) 06/29/2020 0352   HCO3 29.2 (H) 06/29/2020 0352   TCO2 32 06/29/2020 0352   ACIDBASEDEF 1.0 06/29/2020 0352   O2SAT 100.0 06/29/2020 0352    Coagulation Profile: No results for input(s): INR, PROTIME in the last 168 hours. Cardiac Enzymes: No results for input(s): CKTOTAL, CKMB, CKMBINDEX, TROPONINI in the last 168 hours. HbA1C: No results found for: HGBA1C CBG: No results for input(s): GLUCAP in the last 168 hours.  Review of Systems:   Unable to obtain as pt is encephalopathic.  Past medical history  He,  has a past medical history of Arthritis, Atrial fibrillation (HCC) (01/31/2015), Chronic gout, Coronary artery disease (12/2007), COVID-19, Dyslipidemia, Gout, Hiatal hernia, Hyperlipidemia, Hypertension, LBP (low back pain), Partial small bowel obstruction (HCC), RBBB (right bundle branch block), SBO (small bowel obstruction) (HCC), Second degree AV block, Mobitz type I,  and Urinary tract infection.   Surgical History    Past Surgical History:  Procedure Laterality Date  . CARDIAC CATHETERIZATION    . HERNIA REPAIR     INGUINAL  . ROTATOR CUFF REPAIR       Social History   reports that he quit smoking about 29 years ago. He has never used smokeless tobacco. He reports current alcohol use. He reports that he does not use drugs.   Family history   His family  history includes Emphysema (age of onset: 63) in his mother; Heart attack (age of onset: 42) in his father.   Allergies Allergies  Allergen Reactions  . Lorazepam Other (See Comments)    Extreme delirium      Home meds  Prior to Admission medications   Medication Sig Start Date End Date Taking? Authorizing Provider  acetaminophen (TYLENOL) 325 MG tablet Take 650 mg by mouth 2 (two) times daily.   Yes [provider]  allopurinol (ZYLOPRIM) 300 MG tablet Take 1 tablet (300 mg total) by mouth daily. Patient taking differently: Take 300 mg by mouth at bedtime.  12/01/18  Yes [place], Peter M, MD  ALPRAZolam Prudy Feeler) 0.5 MG tablet Take 0.5-1 mg by mouth at bedtime.   Yes [provider]  ascorbic acid (VITAMIN C) 1000 MG tablet Take 1,000 mg by mouth daily.   Yes [provider]  famotidine (PEPCID) 40 MG tablet Take 40 mg by mouth daily as needed for heartburn or indigestion.   Yes [provider]  folic acid (FOLVITE) 1 MG tablet Take 2 mg by mouth daily.  01/21/19  Yes [provider]  metoprolol succinate (TOPROL-XL) 50 MG 24 hr tablet TAKE 1 TABLET DAILY. TAKE WITH OR IMMEDIATELY FOLLOWING A MEAL. Patient taking differently: Take 50 mg by mouth daily. TAKE WITH OR IMMEDIATELY FOLLOWING A MEAL. 09/16/19  Yes [place], Peter M, MD  rivaroxaban (XARELTO) 20 MG TABS tablet Take 1 tablet (20 mg total) by mouth daily with supper. 03/14/20  Yes [place], Peter M, MD  zinc gluconate 50 MG tablet Take 50 mg by mouth daily.   Yes [provider]  ALPRAZolam (XANAX) 0.25 MG tablet Take 1 tablet (0.25 mg total) by mouth 3 (three) times daily as needed for anxiety. 07/03/18   [place], Peter M, MD  iron polysaccharides (NIFEREX) 150 MG capsule Take 1 capsule (150 mg total) by mouth daily. 09/17/19   [place], Peter M, MD    Critical care time: 45 minutes

## 2020-06-29 NOTE — Progress Notes (Signed)
Nutrition Follow-up  DOCUMENTATION CODES:   Severe malnutrition in context of acute illness/injury  INTERVENTION:   Recommend Cortrak on Friday to have for later use  Obtain recent weight   Monitor for refeeding given prolonged poor PO  Tube feeding:  -Vital 1.5 @ 55 ml/hr via OG (1320 ml) -ProSource 45 ml QID  Provides: 2140 kcals, 133 grams protein, 1008 ml free water.   NUTRITION DIAGNOSIS:   Severe Malnutrition related to acute illness (COVID) as evidenced by moderate muscle depletion, energy intake < or equal to 50% for > or equal to 5 days.  Ongoing  GOAL:   Patient will meet greater than or equal to 90% of their needs  Addressed via TF  MONITOR:   Vent status, Skin, TF tolerance, Weight trends, Labs, I & O's  REASON FOR ASSESSMENT:   Consult Assessment of nutrition requirement/status  ASSESSMENT:   79 y.o. male with medical history significant of Spondylosis, gout, GERD, HTn, p A.fib, iron defficiency anemia, heavy alcohol abuse, CAD, HLD, right bundle branch block, history of small bowel obstruction presents with shortness of breath and weakness. Pt diagnosed with COVID-19 pneumonia along with possible aspiration pneumonia, also had evidence of severe dehydration and AKI.   10/27- admit  Had respiratory arrest this am, now intubated. Off pressors. Having episodes of hypoglycemia. Suspect improvement with initiation of tube feeding. Of note pt with oral mucositis. Has not had adequate nutrition since admit (consumed 10-25%). Was being follow by SLP.   OG side port noted to be below GE junction. Would likely benefit from tube advancement or to be replaced with Cortrak.   Admission weight: 111 kg (last taken 10/28)  Patient is currently intubated on ventilator support MV: 12.5 L/min Temp (24hrs), Avg:97.3 F (36.3 C), Min:96.6 F (35.9 C), Max:98.7 F (37.1 C)  UOP: 1900 ml x 24 hrs   Drips: D10 @ 50 ml/hr, propofol (3.3 ml/hr) Medications: folic  acid, SS novolog, thiamine, 220 mg zinc sulfate Labs: CBG 66-89  NUTRITION - FOCUSED PHYSICAL EXAM:    Most Recent Value  Orbital Region No depletion  Upper Arm Region Mild depletion  Thoracic and Lumbar Region Unable to assess  Buccal Region No depletion  Temple Region No depletion  Clavicle Bone Region No depletion  Clavicle and Acromion Bone Region No depletion  Scapular Bone Region Unable to assess  Dorsal Hand No depletion  Patellar Region Mild depletion  Anterior Thigh Region Moderate depletion  Posterior Calf Region Moderate depletion  Edema (RD Assessment) Mild  Hair Reviewed  Eyes Unable to assess  Mouth Unable to assess  Skin Reviewed  Nails Reviewed       Diet Order:   Diet Order            Diet NPO time specified Except for: Sips with Meds  Diet effective now                 EDUCATION NEEDS:   Not appropriate for education at this time  Skin:  Skin Assessment: Skin Integrity Issues: Skin Integrity Issues:: DTI DTI: coccyx  Last BM:  11/2  Height:   Ht Readings from Last 1 Encounters:  06/29/20 5\' 11"  (1.803 m)    Weight:   Wt Readings from Last 1 Encounters:  06/23/20 111 kg    Adjusted Body Weight:   91.3 kg   BMI:  Body mass index is 34.13 kg/m.  Estimated Nutritional Needs:   Kcal:  1552-2100 kcal  Protein:  120-135 g  Fluid:  >/= 2 L/day  Mariana Single RD, LDN Clinical Nutrition Pager listed in [place]

## 2020-06-29 NOTE — Progress Notes (Signed)
Peripherally Inserted Central Catheter Placement  The IV Nurse has discussed with the patient and/or persons authorized to consent for the patient, the purpose of this procedure and the potential benefits and risks involved with this procedure.  The benefits include less needle sticks, lab draws from the catheter, and the patient may be discharged home with the catheter. Risks include, but not limited to, infection, bleeding, blood clot (thrombus formation), and puncture of an artery; nerve damage and irregular heartbeat and possibility to perform a PICC exchange if needed/ordered by physician.  Alternatives to this procedure were also discussed.  Bard Power PICC patient education guide, fact sheet on infection prevention and patient information card has been provided to patient /or left at bedside.    PICC Placement Documentation  PICC Triple Lumen 06/29/20 PICC Left 47 cm 0 cm (Active)  Indication for Insertion or Continuance of Line Prolonged intravenous therapies 06/29/20 1432  Exposed Catheter (cm) 0 cm 06/29/20 1432  Site Assessment Clean;Dry;Intact 06/29/20 1432  Lumen #1 Status Flushed;Blood return noted;Saline locked 06/29/20 1432  Lumen #2 Status Flushed;Blood return noted;Saline locked 06/29/20 1432  Lumen #3 Status Flushed;Blood return noted;Saline locked 06/29/20 1432  Dressing Type Transparent 06/29/20 1432  Dressing Status Clean;Dry;Intact 06/29/20 1432  Antimicrobial disc in place? Yes 06/29/20 1432  Dressing Change Due 07/06/20 06/29/20 1432   Exchange Performed    Audrie Gallus 06/29/2020, 2:36 PM

## 2020-06-30 ENCOUNTER — Encounter (HOSPITAL_COMMUNITY)
Admission: EM | Disposition: A | Discharge status: Home Health | Payer: Self-pay | Source: Home / Self Care | Attending: Internal Medicine

## 2020-06-30 ENCOUNTER — Inpatient Hospital Stay (HOSPITAL_COMMUNITY): Payer: Medicare Other

## 2020-06-30 ENCOUNTER — Encounter (HOSPITAL_COMMUNITY): Payer: Self-pay | Admitting: Internal Medicine

## 2020-06-30 DIAGNOSIS — I482 Chronic atrial fibrillation, unspecified: Secondary | ICD-10-CM

## 2020-06-30 DIAGNOSIS — N179 Acute kidney failure, unspecified: Secondary | ICD-10-CM | POA: Diagnosis not present

## 2020-06-30 DIAGNOSIS — E43 Unspecified severe protein-calorie malnutrition: Secondary | ICD-10-CM | POA: Insufficient documentation

## 2020-06-30 DIAGNOSIS — G9341 Metabolic encephalopathy: Secondary | ICD-10-CM | POA: Diagnosis not present

## 2020-06-30 DIAGNOSIS — I2510 Atherosclerotic heart disease of native coronary artery without angina pectoris: Secondary | ICD-10-CM | POA: Diagnosis not present

## 2020-06-30 HISTORY — PX: ESOPHAGOGASTRODUODENOSCOPY: SHX5428

## 2020-06-30 LAB — HEMOGLOBIN AND HEMATOCRIT, BLOOD
HCT: 20.8 % — ABNORMAL LOW (ref 39.0–52.0)
HCT: 24.1 % — ABNORMAL LOW (ref 39.0–52.0)
HCT: 26.9 % — ABNORMAL LOW (ref 39.0–52.0)
HCT: 24.8 % — ABNORMAL LOW (ref 39.0–52.0)
Hemoglobin: 6.7 g/dL — CL (ref 13.0–17.0)
Hemoglobin: 8 g/dL — ABNORMAL LOW (ref 13.0–17.0)
Hemoglobin: 8.8 g/dL — ABNORMAL LOW (ref 13.0–17.0)
Hemoglobin: 8 g/dL — ABNORMAL LOW (ref 13.0–17.0)

## 2020-06-30 LAB — BLOOD GAS, VENOUS
Acid-Base Excess: 2 mmol/L (ref 0.0–2.0)
Bicarbonate: 25.4 mmol/L (ref 20.0–28.0)
FIO2: 40
O2 Saturation: 84 %
Patient temperature: 36.5
pCO2, Ven: 34.8 mmHg — ABNORMAL LOW (ref 44.0–60.0)
pH, Ven: 7.474 — ABNORMAL HIGH (ref 7.250–7.430)
pO2, Ven: 47.4 mmHg — ABNORMAL HIGH (ref 32.0–45.0)

## 2020-06-30 LAB — COMPREHENSIVE METABOLIC PANEL
ALT: 34 U/L (ref 0–44)
AST: 23 U/L (ref 15–41)
Albumin: 2.5 g/dL — ABNORMAL LOW (ref 3.5–5.0)
Alkaline Phosphatase: 33 U/L — ABNORMAL LOW (ref 38–126)
Anion gap: 10 (ref 5–15)
BUN: 17 mg/dL (ref 8–23)
CO2: 23 mmol/L (ref 22–32)
Calcium: 8.7 mg/dL — ABNORMAL LOW (ref 8.9–10.3)
Chloride: 109 mmol/L (ref 98–111)
Creatinine, Ser: 1.09 mg/dL (ref 0.61–1.24)
GFR, Estimated: >60 mL/min (ref >60)
Glucose, Bld: 111 mg/dL — ABNORMAL HIGH (ref 70–99)
Potassium: 3.2 mmol/L — ABNORMAL LOW (ref 3.5–5.1)
Sodium: 142 mmol/L (ref 135–145)
Total Bilirubin: 1 mg/dL (ref 0.3–1.2)
Total Protein: 4.3 g/dL — ABNORMAL LOW (ref 6.5–8.1)

## 2020-06-30 LAB — TYPE AND SCREEN
ABO/RH(D): AB POS
Antibody Screen: NEGATIVE
Unit division: 0

## 2020-06-30 LAB — CBC
HCT: 22.4 % — ABNORMAL LOW (ref 39.0–52.0)
Hemoglobin: 7.4 g/dL — ABNORMAL LOW (ref 13.0–17.0)
MCH: 33.9 pg (ref 26.0–34.0)
MCHC: 33 g/dL (ref 30.0–36.0)
MCV: 102.8 fL — ABNORMAL HIGH (ref 80.0–100.0)
Platelets: 144 10*3/uL — ABNORMAL LOW (ref 150–400)
RBC: 2.18 MIL/uL — ABNORMAL LOW (ref 4.22–5.81)
RDW: 16.2 % — ABNORMAL HIGH (ref 11.5–15.5)
WBC: 8 10*3/uL (ref 4.0–10.5)
nRBC: 0.3 % — ABNORMAL HIGH (ref 0.0–0.2)

## 2020-06-30 LAB — GLUCOSE, CAPILLARY
Glucose-Capillary: 108 mg/dL — ABNORMAL HIGH (ref 70–99)
Glucose-Capillary: 73 mg/dL (ref 70–99)
Glucose-Capillary: 99 mg/dL (ref 70–99)
Glucose-Capillary: 95 mg/dL (ref 70–99)
Glucose-Capillary: 89 mg/dL (ref 70–99)
Glucose-Capillary: 93 mg/dL (ref 70–99)

## 2020-06-30 LAB — PREPARE RBC (CROSSMATCH)

## 2020-06-30 LAB — MAGNESIUM: Magnesium: 1.5 mg/dL — ABNORMAL LOW (ref 1.7–2.4)

## 2020-06-30 LAB — C-REACTIVE PROTEIN: CRP: 3.3 mg/dL — ABNORMAL HIGH (ref <1.0)

## 2020-06-30 LAB — D-DIMER, QUANTITATIVE: D-Dimer, Quant: 0.46 ug/mL-FEU (ref 0.00–0.50)

## 2020-06-30 LAB — BPAM RBC
Blood Product Expiration Date: 202111292359
ISSUE DATE / TIME: 202111031901
Unit Type and Rh: 8400

## 2020-06-30 LAB — PHOSPHORUS: Phosphorus: 2.4 mg/dL — ABNORMAL LOW (ref 2.5–4.6)

## 2020-06-30 LAB — PROCALCITONIN: Procalcitonin: 0.17 ng/mL

## 2020-06-30 SURGERY — EGD (ESOPHAGOGASTRODUODENOSCOPY)

## 2020-06-30 MED ORDER — MAGNESIUM SULFATE 2 GM/50ML IV SOLN
2.0000 g | Freq: Once | INTRAVENOUS | Status: AC
  Administered 2020-06-30: 2 g via INTRAVENOUS
  Filled 2020-06-30: qty 50

## 2020-06-30 MED ORDER — ALBUMIN HUMAN 25 % IV SOLN
12.5000 g | Freq: Once | INTRAVENOUS | Status: AC
  Administered 2020-06-30: 12.5 g via INTRAVENOUS
  Filled 2020-06-30: qty 50

## 2020-06-30 MED ORDER — SODIUM CHLORIDE 0.9 % IV SOLN
8.0000 mg/h | INTRAVENOUS | Status: DC
  Administered 2020-06-30: 8 mg/h via INTRAVENOUS
  Filled 2020-06-30: qty 80

## 2020-06-30 MED ORDER — PANTOPRAZOLE SODIUM 40 MG IV SOLR
40.0000 mg | Freq: Two times a day (BID) | INTRAVENOUS | Status: DC
  Administered 2020-06-30 – 2020-07-04 (×8): 40 mg via INTRAVENOUS
  Filled 2020-06-30 (×8): qty 40

## 2020-06-30 MED ORDER — PANTOPRAZOLE SODIUM 40 MG IV SOLR
40.0000 mg | Freq: Once | INTRAVENOUS | Status: AC
  Administered 2020-06-30: 40 mg via INTRAVENOUS
  Filled 2020-06-30: qty 40

## 2020-06-30 MED ORDER — POTASSIUM CHLORIDE 20 MEQ PO PACK
40.0000 meq | PACK | Freq: Once | ORAL | Status: AC
  Administered 2020-06-30: 40 meq via ORAL
  Filled 2020-06-30: qty 2

## 2020-06-30 MED ORDER — POTASSIUM PHOSPHATES 15 MMOLE/5ML IV SOLN
15.0000 mmol | Freq: Once | INTRAVENOUS | Status: AC
  Administered 2020-06-30: 15 mmol via INTRAVENOUS
  Filled 2020-06-30: qty 5

## 2020-06-30 MED ORDER — SODIUM CHLORIDE 0.9 % IV SOLN: INTRAVENOUS | Status: DC

## 2020-06-30 MED ORDER — SODIUM CHLORIDE 0.9% IV SOLUTION: Freq: Once | INTRAVENOUS | Status: AC

## 2020-06-30 MED FILL — Medication: Qty: 1 | Status: AC

## 2020-06-30 NOTE — Progress Notes (Signed)
eLink Physician-Brief Progress Note Patient Name: Gregory Wheeler DOB: 1940/10/29 MRN: [id number]   Date of Service  06/30/2020  HPI/Events of Note  Anemia - Hgb = 6.7.   eICU Interventions  Transfuse 1 unit PRBC now.      Intervention Category Major Interventions: Other:  Lenell Antu 06/30/2020, 12:23 AM

## 2020-06-30 NOTE — Progress Notes (Signed)
Patient transported to CT and back without complications.  

## 2020-06-30 NOTE — Progress Notes (Addendum)
[facility] Progress Note  QUINNTON BURY 79 y.o. 15-Nov-1940  CC:  Anemia, GI bleeding  Subjective: Patient intubated and sedated and unable to provide any subjective data.  Dr. Levora Angel received report that patient had some bloody output out of OG tube yesterday, as well as rectal bleeding.  Per RN, had not seen output this morning, patient has not had a bowel movement this morning.  However, per flowsheet, no recent bowel movements have been documented.  Some amount of red blood coming out of her G-tube (approximately 1 cc)  ROS : Unable to obtain due to patient status (intubated and sedated)   Objective: Vital signs in last 24 hours: Vitals:   06/30/20 0849 06/30/20 0900  BP:  (!) 188/71  Pulse: 64 63  Resp: (!) 22 (!) 22  Temp:    SpO2: 97% 98%    Physical Exam:  General:   Intubated and sedated, ill-appearing  Head:  Normocephalic, without obvious abnormality, atraumatic  Eyes:   Conjunctival pallor, EOMs intact  Lungs:    Mechanically ventilated  Heart:  Regular rate and rhythm, S1, S2 normal  Abdomen:   Soft, guarding present, normal active bowel sounds  Extremities: Extremities normal, atraumatic, no  edema  Pulses: 2+ and symmetric    Lab Results: Recent Labs    06/29/20 0645 06/30/20 0618  NA 144 142  K 4.9 3.2*  CL 109 109  CO2 26 23  GLUCOSE 125* 111*  BUN 16 17  CREATININE 1.29* 1.09  CALCIUM 8.8* 8.7*  MG 1.8 1.5*  PHOS 3.2 2.4*   Recent Labs    06/29/20 0126 06/30/20 0618  AST 36 23  ALT 66* 34  ALKPHOS 55 33*  BILITOT 0.5 1.0  PROT 5.1* 4.3*  ALBUMIN 2.3* 2.5*   Recent Labs    06/28/20 2020 06/28/20 2020 06/29/20 0126 06/29/20 0352 06/29/20 0645 06/29/20 1235 06/30/20 0618 06/30/20 0830  WBC 11.5*   < > 18.6*   < > 20.2*  --  8.0  --   NEUTROABS 9.7*  --  14.8*  --   --   --   --   --   HGB 8.5*   < > 9.6*   < > 9.8*   < > 7.4* 8.0*  HCT 26.3*   < > 30.9*   < > 31.1*   < > 22.4* 24.1*  MCV 108.2*   < >  112.0*   < > 109.9*  --  102.8*  --   PLT 222   < > 260   < > 287  --  144*  --    < > = values in this interval not displayed.   No results for input(s): LABPROT, INR in the last 72 hours.   Assessment: Anemia, suspected GI bleeding -Patient with red stools initially, now with scant amount of bloody output in OG tube and report of coffee-ground output in OG tube -Hemoglobin dropped to 6.7 yesterday from 9.6 earlier in the day -Hemoglobin 8.0 now after 1u PRBCs -Normal BUN and creatinine today (17/1.0)  COVID-19, bacterial PNA, bacteremia, patient now intubated  Encephalopathy  Plan: Resume Protonix drip.  We will plan for bedside EGD today.  I called the patient's son, Devonte Migues, and discussed the procedure thoroughly with him.  Discussed nature, alternatives (medical management/PPI), benefits, and risks (including but not limited to bleeding, infection, perforation).  Patient son verbalized understanding and gave verbal consent to proceed with EGD.  Consent witnessed by RN.  CTA while awaiting EGD later today.  Continue to monitor H&H with transfusion as needed to maintain hemoglobin greater than 7.  [facility] will follow.  Edrick Kins PA-C 06/30/2020, 9:56 AM  Contact #  [phone]

## 2020-06-30 NOTE — Progress Notes (Signed)
eLink Physician-Brief Progress Note Patient Name: CHAYNE BAUMGART DOB: [birth year] MRN: [id number]   Date of Service  06/30/2020  HPI/Events of Note  Hypotension - BP = 108/37 with MAP = 57. LVEF = 60-65%.  eICU Interventions  Plan: 1. 25% Albumin 12.5 gm IV now.      Intervention Category Major Interventions: Hypotension - evaluation and management  Zach Tietje Eugene 06/30/2020, 12:17 AM

## 2020-06-30 NOTE — Progress Notes (Signed)
Pharmacy Electrolyte Replacement  Recent Labs:  Recent Labs    06/30/20 0618  K 3.2*  MG 1.5*  PHOS 2.4*  CREATININE 1.09    Low Critical Values (K </= 2.5, Phos </= 1, Mg </= 1) Present: None  MD Contacted: Dr. Judeth Horn  Plan: The patient already had potassium chloride 40 mEq per tube x1 dose and magnesium sulfate 2 g IV x 1 dose. Will also add Give potassium phosphate 15 mmol IV x1 dose.  Sanda Klein, PharmD, RPh  PGY-1 Pharmacy Resident 06/30/2020 3:08 PM  Please check [email or web address] for unit-specific pharmacy phone numbers.

## 2020-06-30 NOTE — Progress Notes (Signed)
NAME:  Gregory Wheeler, MRN:  [id number], DOB:  1941/06/26, LOS: 8 ADMISSION DATE:  06/22/2020, CONSULTATION DATE:  06/29/20 REFERRING MD:  Thedore Mins  CHIEF COMPLAINT:  Respiratory Arrest   Brief History   Gregory Wheeler is a 79 y.o. male who was admitted 10/27 with COVID PNA and possible aspiration PNA.  Started on remdesivir, decadron, unasyn.  Also started on CIWA protocol for DT's.  Early AM 11/3, had respiratory arrest and was subsequently intubated and transferred to the ICU.  History of present illness   Pt is encephalopathic; therefore, this HPI is obtained from chart review.  Gregory Wheeler is a 79 y.o. male who has a PMH as outlined below.  He was admitted on 10/27 with COVID PNA after testing positive 10 days prior per notes (unsure when initial positive test was) and possible aspiration PNA.  He was admitted by [facility] and was started on decadron and remdesivir along with CIWA protocol given his hx of heavy EtOH use and unasyn for possible aspiration PNA.  He was vaccinated with Pfizer around April 2021. Blood cultures noteably positive for enterococcus.  Seen by ID who recommends amp + ceftriaxone x 6 weeks.  11/2 he was evaluated by GI for GIB.  Lovenox was stopped and protonix gtt started.  CTA was recommended if bleeding persisted. 11/2 was also evaluated by ENT for oral denture mucositis.  He was started on diflucan and unasyn added back.  During early AM hours 11/3, he was noted to have worsening hypoxia which progressed into agonal respirations.  SBP dropped to 60's; however, he never lost pulses.  He was bagged by RRT and was administered narcan after receiving morphine earlier in the night.  He had no response to narcan.  Code Blue was called for respiratory arrest.  He was intubated by RT and was transferred to 88M.  Upon arrival, multiple dried clots were suctioned out of his mouth.  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  Significant Hospital Events   10/27 > admit. 11/2 > GI consult for GIB, ENT consult for mucositis. 11/3 > respiratory arrest, intubated and transferred to ICU.  Consults:  GI, ENT, PCCM.  Procedures:  PICC 11/1 >  ETT 11/3 >   Significant Diagnostic Tests:    Micro Data:  Flu 10/27 > neg. COVID 10/27 > pos. Blood 10/27 > enterococcus. Urine 10/28 > neg. Blood 10/29 >   Antimicrobials:  Azithro 10/27 > 10/27 Ceftriaxone 10/27 >  Amp 10/28 > .  11/1 >   Interim history/subjective:  NAEON. Non-focal exam. Hgb dropped, received 1u pRBC. Minimal vent settings, weaning sedation, PSV as able.  Objective:  Blood pressure (!) 125/47, pulse 64, temperature 98.2 F (36.8 C), temperature source Axillary, resp. rate (!) 22, height 5\' 11"  (1.803 m), weight 109.8 kg, SpO2 97 %.    Vent Mode: PRVC FiO2 (%):  [40 %] 40 %  Set Rate:  [22 bmp] 22 bmp Vt Set:  [600 mL] 600 mL PEEP:  [5 cmH20] 5 cmH20 Plateau Pressure:  [17 cmH20-19 cmH20] 17 cmH20   Intake/Output Summary (Last 24 hours) at 06/30/2020 0925 Last data filed at 06/30/2020 0700 Gross per 24 hour  Intake 3055.43 ml  Output 700 ml  Net 2355.43 ml   Filed Weights   06/23/20 0814 06/30/20 0609  Weight: 111 kg 109.8 kg      Examination: General: pale elderly man in NAD Neuro: moving all 4 ext but nonpurposeful, sedated HEENT: ETT in place Cardiovascular: RRR, ext warm Lungs: Scattered rhonci, riding vent Abdomen: soft, BS present Skin: slightly pale  Assessment & Plan:  Acute hypoxemic respiratory failure in setting of worsening delirium and likely aspiration event. Minimal plateau and driving pressures - good lung compliance. Suspect most likely related to benzodiazepines received for CIWA. - Mechanical ventilation, VAP prevention bundle - wean sedation, PS trials - Check tracheal aspirate - NGTD - Hold AC  Acute metabolic encephalopathy with hx of heavy alcohol abuse and question of DTs vs. ICU-associated delirium - wean prop and fentanyl, assess exam, work to PSV and SBT soon  Question GIB - steady decline in Hgb, received 1u pRBC overnight 11/3 to 11/4 for Hgb <7. Had evidence of red blood in OG tube AM 11/3.  - Monitor H/H - PPI drip per GI, possible endoscopy per GI - Monitor ETT output - fluconazole x y days  Transient hypotension- sedation related. Resolved.  Enterococcal bacteremia 10/27, 10/29 cx clear - No TEE, re-consider once out of infectious window for COVID - Rocephin and ampicillin fine for now  Background of breakthrough COVID 19 infection- s/p remdesivir and steroids. Not contributing much to current picture.  Hx CAD, RBBB, Mobitz 1 AVB, Afib- hold AC in light of  bleeding, hold BB in light of hypotension.  - Rechallenge with AC at later tdate  Best Practice:  Diet: tube feeds Pain/Anxiety/Delirium protocol (if indicated): see above VAP protocol (if indicated): ordered DVT prophylaxis: SCDs GI prophylaxis: PPI Glucose control: SSI Mobility: BR Code Status: Full Family Communication: as able Disposition: ICU   Labs   CBC: Recent Labs  Lab 06/27/20 0420 06/27/20 0420 06/28/20 0820 06/28/20 0820 06/28/20 1125 06/28/20 1125 06/28/20 2020 06/28/20 2020  06/29/20 0126 06/29/20 0352 06/29/20 0645 06/29/20 1235 06/29/20 2041 06/30/20 0618 06/30/20 0830  WBC 10.2   < > 12.8*   < > 10.7*  --  11.5*  --  18.6*  --  20.2*  --   --  8.0  --   NEUTROABS 9.3*  --  10.8*  --  9.0*  --  9.7*  --  14.8*  --   --   --   --   --   --   HGB 9.7*   < > 9.8*   < > 9.3*   < > 8.5*   < > 9.6*   < > 9.8* 8.0* 6.7* 7.4* 8.0*  HCT 29.9*   < > 31.0*   < > 29.1*   < > 26.3*   < > 30.9*   < > 31.1* 25.5* 20.8* 22.4* 24.1*  MCV 104.2*   < > 106.2*   < > 106.2*  --  108.2*  --  112.0*  --  109.9*  --   --  102.8*  --   PLT 247   < > 253   < > 221  --  222  --  260  --  287  --   --  144*  --    < > = values in this interval not displayed.   Basic Metabolic Panel: Recent Labs  Lab 06/26/20 0320 06/26/20 0320 06/27/20 0420 06/27/20 0420 06/28/20 0820 06/28/20 0820 06/28/20 2020 06/28/20 2020 06/29/20 0126 06/29/20 0352 06/29/20 2979 06/29/20 0645 06/30/20 0618  NA 140   < > 141   < > 144   < > 142   < > 145 144 144 144 142  K 3.8   < > 3.5   < > 4.1   < > 3.9   < > 4.5 4.8 4.7 4.9 3.2*  CL 106   < > 106   < > 107  --  107  --  110  --   --  109 109  CO2 26   < > 24   < > 26  --  27  --  30  --   --  26 23  GLUCOSE 132*   < > 109*   < > 94  --  89  --  127*  --   --  125* 111*  BUN 17   < > 16   < > 18  --  16  --  14  --   --  16 17  CREATININE 0.93   < > 0.99   < > 1.12  --  1.14  --  1.13  --   --  1.29* 1.09  CALCIUM 8.8*   < > 8.7*   < > 9.0  --  8.5*  --  8.8*  --   --  8.8* 8.7*  MG 1.5*   < > 1.5*  --  1.8  --   --   --  1.8  --   --  1.8 1.5*  PHOS 3.1  --   --   --   --   --   --   --   --   --   --  3.2 2.4*   < > = values in this interval not displayed.   GFR: Estimated Creatinine Clearance: 70.4 mL/min (by C-G formula based on SCr of 1.09 mg/dL). Recent Labs  Lab 06/27/20 0420 06/27/20 0420 06/28/20 0820 06/28/20 1125 06/28/20 2020 06/29/20 0126 06/29/20 0645 06/30/20 0618  PROCALCITON <0.10  --  <0.10  --   --  <0.10  --  0.17   WBC 10.2   < > 12.8*   < > 11.5* 18.6* 20.2* 8.0   < > = values in this interval not displayed.   Liver Function Tests: Recent Labs  Lab 06/26/20 0320 06/27/20 0420 06/28/20 0820 06/29/20 0126 06/30/20 0618  AST 77* 76* 57* 36 23  ALT 67* 82* 75* 66* 34  ALKPHOS 46 46 53 55 33*  BILITOT 0.5 0.8 0.8 0.5 1.0  PROT 4.9* 4.9* 5.1* 5.1* 4.3*  ALBUMIN 2.0* 2.2* 2.3* 2.3* 2.5*   No results for input(s): LIPASE, AMYLASE in the last 168 hours. No results for input(s): AMMONIA in the last 168 hours. ABG    Component Value Date/Time   PHART 7.520 (H) 06/29/2020 0633   PCO2ART 26.6 (L) 06/29/2020 0633   PO2ART 60 (L) 06/29/2020 0633   HCO3 25.4 06/30/2020 0900   TCO2 23 06/29/2020 0633   ACIDBASEDEF 1.0 06/29/2020 0633   O2SAT 84.0 06/30/2020 0900    Coagulation Profile: No results for input(s): INR, PROTIME in the last 168 hours. Cardiac Enzymes: No results  for input(s): CKTOTAL, CKMB, CKMBINDEX, TROPONINI in the last 168 hours. HbA1C: Hgb A1c MFr Bld  Date/Time Value Ref Range Status  06/29/2020 06:45 AM 5.9 (H) 4.8 - 5.6 % Final    Comment:    (NOTE) Pre diabetes:          5.7%-6.4%  Diabetes:              >6.4%  Glycemic control for   <7.0% adults with diabetes    CBG: Recent Labs  Lab 06/29/20 1828 06/29/20 1954 06/29/20 2332 06/30/20 0400 06/30/20 0857  GLUCAP 89 87 113* 108* 73    Review of Systems:   Unable to obtain as pt is encephalopathic.  Past medical history  He,  has a past medical history of Arthritis, Atrial fibrillation (HCC) (01/31/2015), Chronic gout, Coronary artery disease (12/2007), COVID-19, Dyslipidemia, Gout, Hiatal hernia, Hyperlipidemia, Hypertension, LBP (low back pain), Partial small bowel obstruction (HCC), RBBB (right bundle branch block), SBO (small bowel obstruction) (HCC), Second degree AV block, Mobitz type I, and Urinary tract infection.   Surgical History    Past Surgical History:  Procedure Laterality Date  . CARDIAC  CATHETERIZATION    . HERNIA REPAIR     INGUINAL  . ROTATOR CUFF REPAIR       Social History   reports that he quit smoking about 29 years ago. He has never used smokeless tobacco. He reports current alcohol use. He reports that he does not use drugs.   Family history   His family history includes Emphysema (age of onset: 66) in his mother; Heart attack (age of onset: 26) in his father.   Allergies Allergies  Allergen Reactions  . Lorazepam Other (See Comments)    Extreme delirium      Home meds  Prior to Admission medications   Medication Sig Start Date End Date Taking? Authorizing Provider  acetaminophen (TYLENOL) 325 MG tablet Take 650 mg by mouth 2 (two) times daily.   Yes [provider]  allopurinol (ZYLOPRIM) 300 MG tablet Take 1 tablet (300 mg total) by mouth daily. Patient taking differently: Take 300 mg by mouth at bedtime.  12/01/18  Yes [place], Peter M, MD  ALPRAZolam Prudy Feeler) 0.5 MG tablet Take 0.5-1 mg by mouth at bedtime.   Yes [provider]  ascorbic acid (VITAMIN C) 1000 MG tablet Take 1,000 mg by mouth daily.   Yes [provider]  famotidine (PEPCID) 40 MG tablet Take 40 mg by mouth daily as needed for heartburn or indigestion.   Yes [provider]  folic acid (FOLVITE) 1 MG tablet Take 2 mg by mouth daily.  01/21/19  Yes [provider]  metoprolol succinate (TOPROL-XL) 50 MG 24 hr tablet TAKE 1 TABLET DAILY. TAKE WITH OR IMMEDIATELY FOLLOWING A MEAL. Patient taking differently: Take 50 mg by mouth daily. TAKE WITH OR IMMEDIATELY FOLLOWING A MEAL. 09/16/19  Yes [place], Peter M, MD  rivaroxaban (XARELTO) 20 MG TABS tablet Take 1 tablet (20 mg total) by mouth daily with supper. 03/14/20  Yes [place], Peter M, MD  zinc gluconate 50 MG tablet Take 50 mg by mouth daily.   Yes [provider]  ALPRAZolam (XANAX) 0.25 MG tablet Take 1 tablet (0.25 mg total) by mouth 3 (three) times daily as needed for anxiety. 07/03/18    [place], Peter M, MD  iron polysaccharides (NIFEREX) 150 MG capsule Take 1 capsule (150 mg total) by mouth daily. 09/17/19   [place], Peter M, MD  Critical care time:    CRITICAL CARE Performed by: Karren Burly   Total critical care time: 31 minutes  Critical care time was exclusive of separately billable procedures and treating other patients.  Critical care was necessary to treat or prevent imminent or life-threatening deterioration.  Critical care was time spent personally by me on the following activities: development of treatment plan with patient and/or surrogate as well as nursing, discussions with consultants, evaluation of patient's response to treatment, examination of patient, obtaining history from patient or surrogate, ordering and performing treatments and interventions, ordering and review of laboratory studies, ordering and review of radiographic studies, pulse oximetry and re-evaluation of patient's condition.

## 2020-06-30 NOTE — Op Note (Signed)
[facility] Patient Name: Gregory Wheeler Procedure Date : 06/30/2020 MRN: [id number] Attending MD: Vida Rigger , MD Date of Birth: September 18, 1940 CSN: [id number] Age: 79 Admit Type: Inpatient Procedure:                Upper GI endoscopy Indications:              Iron deficiency anemia secondary to chronic blood                            loss, Hematemesis, Melena Providers:                Vida Rigger, MD, Charlett Lango, RN, Harrington Challenger,                            Technician Referring MD:              Medicines:                None he was sedated on the vent but received no                            additional medicines Complications:            No immediate complications. Estimated Blood Loss:     Estimated blood loss: none. Procedure:                Pre-Anesthesia Assessment:                           - Prior to the procedure, a History and Physical                            was performed, and patient medications and                            allergies were reviewed. The patient's tolerance of                            previous anesthesia was also reviewed. The risks                            and benefits of the procedure and the sedation                            options and risks were discussed with the patient.                            All questions were answered, and informed consent                            was obtained. Prior Anticoagulants: The patient has                            taken Lovenox (enoxaparin), last dose was 3 days  prior to procedure. ASA Grade Assessment: IV - A                            patient with severe systemic disease that is a                            constant threat to life. After reviewing the risks                            and benefits, the patient was deemed in                            satisfactory condition to undergo the procedure.                           After obtaining informed consent, the  endoscope was                            passed under direct vision. Throughout the                            procedure, the patient's blood pressure, pulse, and                            oxygen saturations were monitored continuously. The                            [id number]-[id number] ([id number] Olympus gastroscope was                            introduced through the mouth, and advanced to the                            fourth part of duodenum. The upper GI endoscopy was                            accomplished without difficulty. The patient                            tolerated the procedure well. Scope In: Scope Out: Findings:      A single small bleb was found in the lower third of the esophagus with a       shallow abrasion next to it probably from NG tube trauma.      Combination of tube feeds and bilious material fluid was found in the       gastric body. And was easily suctioned      A gastric tube was found in the gastric antrum.      Localized mild inflammation characterized by congestion (edema) and       erythema was found in the cardia, in the gastric fundus and in the       gastric antrum.      The duodenal bulb, first portion of the duodenum, second portion of the       duodenum, third portion of the duodenum and  fourth portion of the       duodenum were normal.      The exam was otherwise without abnormality. No blood was seen throughout       the procedure      A small hiatal hernia was present. Impression:               - Bleb found in the esophagus with shallow abrasion                           Probably from NG tube trauma.                           - Combination of tube feeds and bilious material                            gastric fluid suctioned.                           - A gastric tube was found in the stomach.                           -Probable caustic gastritis from the NG tube.                           - Normal duodenal bulb, first portion of the                             duodenum, second portion of the duodenum, third                            portion of the duodenum and fourth portion of the                            duodenum.                           - The examination was otherwise normal. No blood                            was seen throughout the procedure                           - Small hiatal hernia.                           - No specimens collected. Recommendation:           - May resume tube feeds if no signs of ongoing                            bleeding for 6 hours.                           - Continue present medications. Consider CTA or  nuclear bleeding scan if signs of bleeding continues                           - Return to GI clinic PRN.                           - Telephone GI clinic if symptomatic PRN. Will                            check on tomorrow Procedure Code(s):        --- Professional ---                           [phone], Esophagogastroduodenoscopy, flexible,                            transoral; diagnostic, including collection of                            specimen(s) by brushing or washing, when performed                            (separate procedure) Diagnosis Code(s):        --- Professional ---                           K22.8, Other specified diseases of esophagus                           Z93.1, Gastrostomy status                           T54.94XA, Toxic effect of unspecified corrosive                            substance, undetermined, initial encounter                           T28.7XXA, Corrosion of other parts of alimentary                            tract, initial encounter                           D50.0, Iron deficiency anemia secondary to blood                            loss (chronic)                           K92.0, Hematemesis                           K92.1, Melena (includes Hematochezia) CPT copyright 2019 [employer]. All rights reserved. The  codes documented in this report are preliminary and upon coder review may  be revised to meet current compliance requirements. Vida Rigger, MD 06/30/2020 2:23:10 PM This report has been  signed electronically. Number of Addenda: 0

## 2020-06-30 NOTE — Progress Notes (Signed)
Patient had EGD.  Floor RN managed sedation.  Continuous VS per ICU monitoring.  ICU RN to recover patient.

## 2020-07-01 DIAGNOSIS — J1282 Pneumonia due to coronavirus disease 2019: Secondary | ICD-10-CM | POA: Diagnosis not present

## 2020-07-01 DIAGNOSIS — U071 COVID-19: Secondary | ICD-10-CM | POA: Diagnosis not present

## 2020-07-01 DIAGNOSIS — G9341 Metabolic encephalopathy: Secondary | ICD-10-CM | POA: Diagnosis not present

## 2020-07-01 LAB — COMPREHENSIVE METABOLIC PANEL
ALT: 42 U/L (ref 0–44)
AST: 32 U/L (ref 15–41)
Albumin: 2.2 g/dL — ABNORMAL LOW (ref 3.5–5.0)
Alkaline Phosphatase: 46 U/L (ref 38–126)
Anion gap: 6 (ref 5–15)
BUN: 13 mg/dL (ref 8–23)
CO2: 24 mmol/L (ref 22–32)
Calcium: 8.7 mg/dL — ABNORMAL LOW (ref 8.9–10.3)
Chloride: 112 mmol/L — ABNORMAL HIGH (ref 98–111)
Creatinine, Ser: 0.98 mg/dL (ref 0.61–1.24)
GFR, Estimated: >60 mL/min (ref >60)
Glucose, Bld: 112 mg/dL — ABNORMAL HIGH (ref 70–99)
Potassium: 4.1 mmol/L (ref 3.5–5.1)
Sodium: 142 mmol/L (ref 135–145)
Total Bilirubin: 0.5 mg/dL (ref 0.3–1.2)
Total Protein: 4.4 g/dL — ABNORMAL LOW (ref 6.5–8.1)

## 2020-07-01 LAB — CBC
HCT: 26.9 % — ABNORMAL LOW (ref 39.0–52.0)
Hemoglobin: 8.6 g/dL — ABNORMAL LOW (ref 13.0–17.0)
MCH: 33.7 pg (ref 26.0–34.0)
MCHC: 32 g/dL (ref 30.0–36.0)
MCV: 105.5 fL — ABNORMAL HIGH (ref 80.0–100.0)
Platelets: 143 10*3/uL — ABNORMAL LOW (ref 150–400)
RBC: 2.55 MIL/uL — ABNORMAL LOW (ref 4.22–5.81)
RDW: 16.7 % — ABNORMAL HIGH (ref 11.5–15.5)
WBC: 8.7 10*3/uL (ref 4.0–10.5)
nRBC: 0 % (ref 0.0–0.2)

## 2020-07-01 LAB — C-REACTIVE PROTEIN: CRP: 7.9 mg/dL — ABNORMAL HIGH (ref <1.0)

## 2020-07-01 LAB — GLUCOSE, CAPILLARY
Glucose-Capillary: 96 mg/dL (ref 70–99)
Glucose-Capillary: 94 mg/dL (ref 70–99)
Glucose-Capillary: 103 mg/dL — ABNORMAL HIGH (ref 70–99)
Glucose-Capillary: 96 mg/dL (ref 70–99)
Glucose-Capillary: 112 mg/dL — ABNORMAL HIGH (ref 70–99)
Glucose-Capillary: 98 mg/dL (ref 70–99)

## 2020-07-01 LAB — TYPE AND SCREEN
ABO/RH(D): AB POS
Antibody Screen: NEGATIVE
Unit division: 0

## 2020-07-01 LAB — CULTURE, RESPIRATORY W GRAM STAIN

## 2020-07-01 LAB — BPAM RBC
Blood Product Expiration Date: 202111292359
ISSUE DATE / TIME: 202111040324
Unit Type and Rh: 8400

## 2020-07-01 LAB — HEMOGLOBIN AND HEMATOCRIT, BLOOD
HCT: 26.5 % — ABNORMAL LOW (ref 39.0–52.0)
HCT: 28.9 % — ABNORMAL LOW (ref 39.0–52.0)
Hemoglobin: 8.3 g/dL — ABNORMAL LOW (ref 13.0–17.0)
Hemoglobin: 9 g/dL — ABNORMAL LOW (ref 13.0–17.0)

## 2020-07-01 LAB — PHOSPHORUS: Phosphorus: 3.8 mg/dL (ref 2.5–4.6)

## 2020-07-01 LAB — PROCALCITONIN: Procalcitonin: <0.1 ng/mL

## 2020-07-01 LAB — D-DIMER, QUANTITATIVE: D-Dimer, Quant: 0.73 ug/mL-FEU — ABNORMAL HIGH (ref 0.00–0.50)

## 2020-07-01 LAB — MAGNESIUM: Magnesium: 1.4 mg/dL — ABNORMAL LOW (ref 1.7–2.4)

## 2020-07-01 MED ORDER — ORAL CARE MOUTH RINSE
15.0000 mL | OROMUCOSAL | Status: DC
  Administered 2020-07-01 – 2020-07-13 (×107): 15 mL via OROMUCOSAL

## 2020-07-01 MED ORDER — ENOXAPARIN SODIUM 120 MG/0.8ML ~~LOC~~ SOLN
110.0000 mg | Freq: Two times a day (BID) | SUBCUTANEOUS | Status: DC
  Administered 2020-07-01: 110 mg via SUBCUTANEOUS
  Filled 2020-07-01 (×2): qty 0.74

## 2020-07-01 MED ORDER — POLYETHYLENE GLYCOL 3350 17 G PO PACK
17.0000 g | PACK | Freq: Every day | ORAL | Status: DC
  Administered 2020-07-01: 17 g via ORAL
  Filled 2020-07-01: qty 1

## 2020-07-01 MED ORDER — MIDAZOLAM 50MG/50ML (1MG/ML) PREMIX INFUSION
0.5000 mg/h | INTRAVENOUS | Status: DC
  Administered 2020-07-01: 0.5 mg/h via INTRAVENOUS
  Administered 2020-07-02: 1 mg/h via INTRAVENOUS
  Filled 2020-07-01 (×2): qty 50

## 2020-07-01 MED ORDER — SODIUM CHLORIDE 0.9 % IV SOLN
2.0000 g | INTRAVENOUS | Status: DC
  Administered 2020-07-01 – 2020-07-12 (×64): 2 g via INTRAVENOUS
  Filled 2020-07-01: qty 2
  Filled 2020-07-01 (×23): qty 2000
  Filled 2020-07-01: qty 2
  Filled 2020-07-01 (×2): qty 2000
  Filled 2020-07-01: qty 2
  Filled 2020-07-01 (×3): qty 2000
  Filled 2020-07-01 (×2): qty 2
  Filled 2020-07-01: qty 2000
  Filled 2020-07-01 (×2): qty 2
  Filled 2020-07-01: qty 2000
  Filled 2020-07-01: qty 2
  Filled 2020-07-01 (×2): qty 2000
  Filled 2020-07-01: qty 2
  Filled 2020-07-01 (×3): qty 2000
  Filled 2020-07-01: qty 2
  Filled 2020-07-01: qty 2000
  Filled 2020-07-01: qty 2
  Filled 2020-07-01 (×3): qty 2000
  Filled 2020-07-01: qty 2
  Filled 2020-07-01: qty 2000
  Filled 2020-07-01: qty 2
  Filled 2020-07-01 (×2): qty 2000
  Filled 2020-07-01: qty 2
  Filled 2020-07-01: qty 2000
  Filled 2020-07-01 (×3): qty 2
  Filled 2020-07-01 (×4): qty 2000
  Filled 2020-07-01: qty 2
  Filled 2020-07-01 (×7): qty 2000
  Filled 2020-07-01: qty 2
  Filled 2020-07-01 (×4): qty 2000
  Filled 2020-07-01 (×2): qty 2

## 2020-07-01 MED ORDER — MAGNESIUM SULFATE 4 GM/100ML IV SOLN
4.0000 g | Freq: Once | INTRAVENOUS | Status: AC
  Administered 2020-07-01: 4 g via INTRAVENOUS
  Filled 2020-07-01: qty 100

## 2020-07-01 MED ORDER — POLYETHYLENE GLYCOL 3350 17 G PO PACK
17.0000 g | PACK | Freq: Every day | ORAL | Status: DC
  Administered 2020-07-02 – 2020-07-03 (×2): 17 g
  Filled 2020-07-01 (×3): qty 1

## 2020-07-01 MED ORDER — CHLORHEXIDINE GLUCONATE 0.12% ORAL RINSE (MEDLINE KIT)
15.0000 mL | Freq: Two times a day (BID) | OROMUCOSAL | Status: DC
  Administered 2020-07-02 – 2020-07-13 (×24): 15 mL via OROMUCOSAL

## 2020-07-01 NOTE — Progress Notes (Signed)
Pharmacy Electrolyte Replacement  Recent Labs:  Recent Labs    07/01/20 0237  K 4.1  MG 1.4*  PHOS 3.8  CREATININE 0.98    Low Critical Values (K </= 2.5, Phos </= 1, Mg </= 1) Present: None  MD Contacted: Dr. Judeth Horn  Plan: Give magnesium sulfate 4 g IV x1 dose. Check magnesium with AM labs on 11/6.  Sanda Klein, PharmD, RPh  PGY-1 Pharmacy Resident 07/01/2020 6:05 AM  Please check [email or web address] for unit-specific pharmacy phone numbers.

## 2020-07-01 NOTE — Progress Notes (Signed)
Gregory Wheeler 9:19 AM  Subjective: Patient's condition unchanged but no signs of current bleeding Case discussed with his nurse and patient seen in the ICU no bowel movements yet today  Objective: Vital signs stable they tried to wean his sedation medicine but he would not follow commands hemoglobin slight increase white count okay BUN and creatinine okay  Assessment: Multiple medical problems including periodic recurrent GI bleeding  Plan: We will follow at a distance and please call our GI partner on-call this weekend if question or problem or signs of recurrent bleeding otherwise management per ICU team [facility]  office [phone] After 5PM or if no answer call [phone]

## 2020-07-01 NOTE — Progress Notes (Signed)
PT Cancellation Note  Patient Details Name: Gregory Wheeler MRN: [id number] DOB: 01-Dec-1940   Cancelled Treatment:    Reason Eval/Treat Not Completed: Patient not medically ready (pt intubated, sedated with paralytics. Will hold until next week and check on pt. Discussed with RN)   [name] [name] 07/01/2020, 7:50 AM  [name], PT Acute Rehabilitation Services Pager: [phone] Office: [phone]

## 2020-07-01 NOTE — Progress Notes (Signed)
[facility] TEAM NOTE  Consult for Cortrak placement received. Based on volume of Cortrak consults, Cortrak team was unable to place tube for this patient.   RN made aware. RD who covers the floor also made aware.      Trenton Gammon, MS, RD, LDN, CNSC Inpatient Clinical Dietitian RD pager # available in AMION  After hours/weekend pager # available in [facility]

## 2020-07-01 NOTE — Progress Notes (Addendum)
NAME:  Gregory Wheeler, MRN:  [id number], DOB:  23-Jan-1941, LOS: 9 ADMISSION DATE:  06/22/2020, CONSULTATION DATE:  06/29/20 REFERRING MD:  Thedore Mins  CHIEF COMPLAINT:  Respiratory Arrest   Brief History   Gregory Wheeler is a 79 y.o. male who was admitted 10/27 with COVID PNA and possible aspiration PNA.  Started on remdesivir, decadron, unasyn.  Also started on CIWA protocol for DT's.   Early AM 11/3, had respiratory arrest and was subsequently intubated and transferred to the ICU.  History of present illness   Pt is encephalopathic; therefore, this HPI is obtained from chart review.  Gregory Wheeler is a 79 y.o. male who has a PMH as outlined below.  He was admitted on 10/27 with COVID PNA after testing positive 10 days prior per notes (unsure when initial positive test was) and possible aspiration PNA.  He was admitted by [facility] and was started on decadron and remdesivir along with CIWA protocol given his hx of heavy EtOH use and unasyn for possible aspiration PNA.  He was vaccinated with Pfizer around April 2021. Blood cultures noteably positive for enterococcus.  Seen by ID who recommends amp + ceftriaxone x 6 weeks.  11/2 he was evaluated by GI for GIB.  Lovenox was stopped and protonix gtt started.  CTA was recommended if bleeding persisted. 11/2 was also evaluated by ENT for oral denture mucositis.  He was started on diflucan and unasyn added back.  During early AM hours 11/3, he was noted to have worsening hypoxia which progressed into agonal respirations.  SBP dropped to 60's; however, he never lost pulses.  He was bagged by RRT and was administered narcan after receiving morphine earlier in the night.  He had no response to narcan.  Code Blue was called for respiratory arrest.  He was intubated by RT and was transferred to 32M.  Upon arrival, multiple dried clots were suctioned out of his mouth.  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  Significant Hospital Events   10/27 > admit. 11/2 > GI consult for GIB, ENT consult for mucositis. 11/3 > respiratory arrest, intubated and transferred to ICU.  Consults:  GI, ENT, PCCM.  Procedures:  PICC 11/1 >  ETT 11/3 >   Significant Diagnostic Tests:    Micro Data:  Flu 10/27 > neg. COVID 10/27 > pos. Blood 10/27 > enterococcus. Urine 10/28 > neg. Blood 10/29 >   Antimicrobials:  Azithro 10/27 > 10/27 Ceftriaxone 10/27 >  Amp 10/28 > .  11/1 >   Interim history/subjective:  Sedated on assessment  Unable to tolerate SBT this am due to tachypnea   Objective:  Blood pressure (!) 107/58, pulse 63, temperature 98.5 F (36.9 C), temperature source Oral, resp. rate (!) 22, height 5\' 11"  (1.803 m), weight 109.5 kg, SpO2 98 %.    Vent Mode: PRVC FiO2 (%):  [40 %] 40 %  Set Rate:  [22 bmp] 22 bmp Vt Set:  [450 mL] 450 mL PEEP:  [5 cmH20] 5 cmH20 Plateau Pressure:  [14 cmH20-16 cmH20] 14 cmH20   Intake/Output Summary (Last 24 hours) at 07/01/2020 0933 Last data filed at 07/01/2020 0800 Gross per 24 hour  Intake 1813.53 ml  Output 1085 ml  Net 728.53 ml   Filed Weights   06/23/20 0814 06/30/20 0609 07/01/20 0246  Weight: 111 kg 109.8 kg 109.5 kg     Examination: General: Chronically ill appearing elderly male lying in bed on mechanical ventilation, in NAD HEENT: ETT, MM pink/moist, PERRL,  Neuro: Sedated but will grimace to painful stimuli  CV: s1s2 regular rate and rhythm, no murmur, rubs, or gallops,  PULM:  Clear to ascultation, no added breath sounds, no increased work of breathing, tolerating vent  GI: soft, bowel sounds active in all 4 quadrants, non-tender, non-distended, Extremities: warm/dry, no edema  Skin: no rashes or lesions  Resolved problems:  Transient hypotension  Assessment & Plan:  Acute hypoxemic respiratory failure in setting of worsening delirium and likely aspiration event.  -Minimal plateau and driving pressures - good lung compliance. Suspect most likely related to benzodiazepines received for CIWA. P: Continue ventilator support with lung protective strategies  Continue Daily SBT trials Wean sedation Wean PEEP and FiO2 for sats greater than 90%. Head of bed elevated 30 degrees. Plateau pressures less than 30 cm H20.  Follow intermittent chest x-ray and ABG.   SAT/SBT as tolerated, mentation preclude extubation  Ensure adequate pulmonary hygiene  Follow cultures  VAP bundle in place  PAD protocol  Enterococcal bacteremia 10/27 -Culture negative 10/29 -No TEE, re-consider once out of infectious window for COVID -Likely will need 6 weeks of antibiotic therapy vs TEE P: Antibiotic plan per ID ID consulted, primary management   Acute metabolic encephalopathy with hx of heavy alcohol abuse and question of DTs vs. ICU-associated delirium P: Need to wean sedation  Delirium precautions  Supportive care  CIWA protocol   Question GIB  - steady decline in Hgb, received 1u pRBC overnight 11/3 to 11/4 for Hgb <7. Had evidence of red blood in OG tube AM 11/3.  P: GI consulted and now will be available as needed  Underwent upper EGD 11/4 with probable caustic gastritis from the NG tube. May  resume tube feeds Continue BID PPI   Oral yeast -Rare budding yeast seen on respiratory culture  P: Has received fluconazole x3 days  Will switch to oral nystatin today   Background of breakthrough COVID 19 infection - s/p remdesivir and steroids. Not contributing much to current picture. P: Supportive care   Hx CAD, RBBB, Mobitz 1 AVB, Afib - Baseline anticoagulated with PO Xarelto   P: Upper EGD negative for acute bleed Spoke with attending and decision was made to hold full dose lovenox for an additional day  Home beta blocker remains on hold   Best Practice:  Diet: tube feeds Pain/Anxiety/Delirium protocol (if indicated): see above VAP protocol (if indicated): ordered DVT prophylaxis: SCDs GI prophylaxis: PPI Glucose control: SSI Mobility: BR Code Status: Full Family Communication: Will attempt to update over the phone 11/5 Disposition: ICU   Labs   CBC: Recent Labs  Lab 06/27/20 0420 06/27/20 0420 06/28/20 0820 06/28/20 0820 06/28/20 1125 06/28/20 1125 06/28/20 2020 06/28/20 2020 06/29/20 0126 06/29/20 0352 06/29/20 0645 06/29/20 1235 06/30/20 0618 06/30/20 0830 06/30/20 1241 06/30/20 2002 07/01/20 0237  WBC 10.2   < > 12.8*   < >  10.7*   < > 11.5*  --  18.6*  --  20.2*  --  8.0  --   --   --  8.7  NEUTROABS 9.3*  --  10.8*  --  9.0*  --  9.7*  --  14.8*  --   --   --   --   --   --   --   --   HGB 9.7*   < > 9.8*   < > 9.3*   < > 8.5*   < > 9.6*   < > 9.8*   < > 7.4* 8.0* 8.8* 8.0* 8.6*  HCT 29.9*   < > 31.0*   < > 29.1*   < > 26.3*   < > 30.9*   < > 31.1*   < > 22.4* 24.1* 26.9* 24.8* 26.9*  MCV 104.2*   < > 106.2*   < > 106.2*   < > 108.2*  --  112.0*  --  109.9*  --  102.8*  --   --   --  105.5*  PLT 247   < > 253   < > 221   < > 222  --  260  --  287  --  144*  --   --   --  143*   < > = values in this interval not displayed.   Basic Metabolic Panel: Recent Labs  Lab 06/26/20 0320 06/27/20 0420 06/28/20 0820 06/28/20 0820 06/28/20 2020  06/28/20 2020 06/29/20 0126 06/29/20 0126 06/29/20 0352 06/29/20 3810 06/29/20 0645 06/30/20 0618 07/01/20 0237  NA 140   < > 144   < > 142   < > 145   < > 144 144 144 142 142  K 3.8   < > 4.1   < > 3.9   < > 4.5   < > 4.8 4.7 4.9 3.2* 4.1  CL 106   < > 107   < > 107  --  110  --   --   --  109 109 112*  CO2 26   < > 26   < > 27  --  30  --   --   --  26 23 24   GLUCOSE 132*   < > 94   < > 89  --  127*  --   --   --  125* 111* 112*  BUN 17   < > 18   < > 16  --  14  --   --   --  16 17 13   CREATININE 0.93   < > 1.12   < > 1.14  --  1.13  --   --   --  1.29* 1.09 0.98  CALCIUM 8.8*   < > 9.0   < > 8.5*  --  8.8*  --   --   --  8.8* 8.7* 8.7*  MG 1.5*   < > 1.8  --   --   --  1.8  --   --   --  1.8 1.5* 1.4*  PHOS 3.1  --   --   --   --   --   --   --   --   --  3.2 2.4* 3.8   < > = values in this interval not displayed.   GFR: Estimated Creatinine Clearance: 78.2 mL/min (by C-G formula based on SCr of 0.98 mg/dL). Recent Labs  Lab 06/28/20 0820 06/28/20 1125 06/29/20  0126 06/29/20 0645 06/30/20 0618 07/01/20 0237  PROCALCITON <0.10  --  <0.10  --  0.17 <0.10  WBC 12.8*   < > 18.6* 20.2* 8.0 8.7   < > = values in this interval not displayed.   Liver Function Tests: Recent Labs  Lab 06/27/20 0420 06/28/20 0820 06/29/20 0126 06/30/20 0618 07/01/20 0237  AST 76* 57* 36 23 32  ALT 82* 75* 66* 34 42  ALKPHOS 46 53 55 33* 46  BILITOT 0.8 0.8 0.5 1.0 0.5  PROT 4.9* 5.1* 5.1* 4.3* 4.4*  ALBUMIN 2.2* 2.3* 2.3* 2.5* 2.2*   No results for input(s): LIPASE, AMYLASE in the last 168 hours. No results for input(s): AMMONIA in the last 168 hours. ABG    Component Value Date/Time   PHART 7.520 (H) 06/29/2020 0633   PCO2ART 26.6 (L) 06/29/2020 0633   PO2ART 60 (L) 06/29/2020 0633   HCO3 25.4 06/30/2020 0900   TCO2 23 06/29/2020 0633   ACIDBASEDEF 1.0 06/29/2020 0633   O2SAT 84.0 06/30/2020 0900    Coagulation Profile: No results for input(s): INR, PROTIME in the last 168  hours. Cardiac Enzymes: No results for input(s): CKTOTAL, CKMB, CKMBINDEX, TROPONINI in the last 168 hours. HbA1C: Hgb A1c MFr Bld  Date/Time Value Ref Range Status  06/29/2020 06:45 AM 5.9 (H) 4.8 - 5.6 % Final    Comment:    (NOTE) Pre diabetes:          5.7%-6.4%  Diabetes:              >6.4%  Glycemic control for   <7.0% adults with diabetes    CBG: Recent Labs  Lab 06/30/20 1109 06/30/20 1557 06/30/20 1948 06/30/20 2316 07/01/20 0349  GLUCAP 99 95 89 93 96    Critical care time:    Performed by: Delfin GantWhitney F Zakry Caso  Total critical care time: 38minutes  Critical care time was exclusive of separately billable procedures and treating other patients.  Critical care was necessary to treat or prevent imminent or life-threatening deterioration.  Critical care was time spent personally by me on the following activities: development of treatment plan with patient and/or surrogate as well as nursing, discussions with consultants, evaluation of patient's response to treatment, examination of patient, obtaining history from patient or surrogate, ordering and performing treatments and interventions, ordering and review of laboratory studies, ordering and review of radiographic studies, pulse oximetry and re-evaluation of patient's condition.

## 2020-07-01 NOTE — Progress Notes (Signed)
OT Cancellation Note  Patient Details Name: [name] MRN: [id number] DOB: [birth year]   Cancelled Treatment:    Reason Eval/Treat Not Completed: Medical issues which prohibited therapy;Other (comment) (Intubated and sedated. Will return as pt medically ready)  Lisbet Busker M Nghia Mcentee Filomeno Cromley MSOT, OTR/L Acute Rehab Pager: [phone] Office: [phone] 07/01/2020, 4:16 PM

## 2020-07-01 NOTE — Progress Notes (Signed)
[facility]   Reason for visit: Follow up on Enterococcal bacteremia  Interval History: remains intubated in ICU, no change in vent status.  WBC wnl, afebrile Amp +/- sulbactam day 9 Ceftriaxone day 8  Physical Exam: Constitutional:  Vitals:   07/01/20 0825 07/01/20 0900  BP: (!) 107/58 114/62  Pulse: 63 61  Resp: (!) 22 (!) 22  Temp:    SpO2: 98% 96%   patient appears in NAD Eyes: anicteric HENT: +ET Respiratory: Normal respiratory effort; CTA B Cardiovascular: RRR GI: soft, nt, nd  Review of Systems: Unable to be assessed due to mental status  Lab Results  Component Value Date   WBC 8.7 07/01/2020   HGB 8.6 (L) 07/01/2020   HCT 26.9 (L) 07/01/2020   MCV 105.5 (H) 07/01/2020   PLT 143 (L) 07/01/2020    Lab Results  Component Value Date   CREATININE 0.98 07/01/2020   BUN 13 07/01/2020   NA 142 07/01/2020   K 4.1 07/01/2020   CL 112 (H) 07/01/2020   CO2 24 07/01/2020    Lab Results  Component Value Date   ALT 42 07/01/2020   AST 32 07/01/2020   ALKPHOS 46 07/01/2020     Microbiology: Recent Results (from the past 240 hour(s))  Blood Culture (routine x 2)     Status: Abnormal   Collection Time: 06/22/20 11:41 AM   Specimen: Right Antecubital; Blood  Result Value Ref Range Status   Specimen Description   Final    RIGHT ANTECUBITAL Performed at [facility], [address] [place], [place] [address]    Special Requests   Final    BOTTLES DRAWN AEROBIC AND ANAEROBIC Blood Culture adequate volume Performed at [facility], [address] [place], [place] [address]    Culture  Setup Time   Final    IN BOTH AEROBIC AND ANAEROBIC BOTTLES GRAM POSITIVE COCCI Organism ID to follow CRITICAL RESULT CALLED TO, READ BACK BY AND VERIFIED WITH: Lana Fish PharmD 8:25 06/23/20 ([email or web address]) Performed at [facility], [address] [place], [place] [address]    Culture ENTEROCOCCUS FAECALIS (A)  Final    Report Status 06/25/2020 FINAL  Final   Organism ID, Bacteria ENTEROCOCCUS FAECALIS  Final      Susceptibility   Enterococcus faecalis - MIC*    AMPICILLIN <=2 SENSITIVE Sensitive     VANCOMYCIN 2 SENSITIVE Sensitive     GENTAMICIN SYNERGY SENSITIVE Sensitive     * ENTEROCOCCUS FAECALIS  Blood Culture ID Panel (Reflexed)     Status: Abnormal   Collection Time: 06/22/20 11:41 AM  Result Value Ref Range Status   Enterococcus faecalis DETECTED (A) NOT DETECTED Final    Comment: CRITICAL RESULT CALLED TO, READ BACK BY AND VERIFIED WITH: Lana Fish PharmD 8:25 06/23/20 ([email or web address])    Enterococcus Faecium NOT DETECTED NOT DETECTED Final   Listeria monocytogenes NOT DETECTED NOT DETECTED Final   Staphylococcus species NOT DETECTED NOT DETECTED Final   Staphylococcus aureus (BCID) NOT DETECTED NOT DETECTED Final   Staphylococcus epidermidis NOT DETECTED NOT DETECTED Final   Staphylococcus lugdunensis NOT DETECTED NOT DETECTED Final   Streptococcus species NOT DETECTED NOT DETECTED Final   Streptococcus agalactiae NOT DETECTED NOT DETECTED Final   Streptococcus pneumoniae NOT DETECTED NOT DETECTED Final   Streptococcus pyogenes NOT DETECTED NOT DETECTED Final   A.calcoaceticus-baumannii NOT DETECTED NOT DETECTED Final   Bacteroides fragilis NOT DETECTED NOT DETECTED Final  Enterobacterales NOT DETECTED NOT DETECTED Final   Enterobacter cloacae complex NOT DETECTED NOT DETECTED Final   Escherichia coli NOT DETECTED NOT DETECTED Final   Klebsiella aerogenes NOT DETECTED NOT DETECTED Final   Klebsiella oxytoca NOT DETECTED NOT DETECTED Final   Klebsiella pneumoniae NOT DETECTED NOT DETECTED Final   Proteus species NOT DETECTED NOT DETECTED Final   Salmonella species NOT DETECTED NOT DETECTED Final   Serratia marcescens NOT DETECTED NOT DETECTED Final   Haemophilus influenzae NOT DETECTED NOT DETECTED Final   Neisseria meningitidis NOT DETECTED NOT DETECTED Final   Pseudomonas aeruginosa NOT  DETECTED NOT DETECTED Final   Stenotrophomonas maltophilia NOT DETECTED NOT DETECTED Final   Candida albicans NOT DETECTED NOT DETECTED Final   Candida auris NOT DETECTED NOT DETECTED Final   Candida glabrata NOT DETECTED NOT DETECTED Final   Candida krusei NOT DETECTED NOT DETECTED Final   Candida parapsilosis NOT DETECTED NOT DETECTED Final   Candida tropicalis NOT DETECTED NOT DETECTED Final   Cryptococcus neoformans/gattii NOT DETECTED NOT DETECTED Final   Vancomycin resistance NOT DETECTED NOT DETECTED Final    Comment: Performed at [facility], [address], [place], [place] [address]  Respiratory Panel by RT PCR (Flu A&B, Covid) - Nasopharyngeal Swab     Status: Abnormal   Collection Time: 06/22/20 11:43 AM   Specimen: Nasopharyngeal Swab  Result Value Ref Range Status   SARS Coronavirus 2 by RT PCR POSITIVE (A) NEGATIVE Final    Comment: RESULT CALLED TO, READ BACK BY AND VERIFIED WITH:  COBLE SAM,RN @1323  ON 06/22/2020, [email or web address] (NOTE) SARS-CoV-2 target nucleic acids are DETECTED.  SARS-CoV-2 RNA is generally detectable in upper respiratory specimens  during the acute phase of infection. Positive results are indicative of the presence of the identified virus, but do not rule out bacterial infection or co-infection with other pathogens not detected by the test. Clinical correlation with patient history and other diagnostic information is necessary to determine patient infection status. The expected result is Negative.  Fact Sheet for Patients:  06/24/2020  Fact Sheet for Healthcare Providers: https://www.moore.com/  This test is not yet approved or cleared by the https://www.young.biz/ FDA and  has been authorized for detection and/or diagnosis of SARS-CoV-2 by FDA under an Emergency Use Authorization (EUA).  This EUA will remain in effect (meaning this  test can be used) for the duration of  the COVID-19 declaration  under Section 564(b)(1) of the Act, 21 U.S.C. section 360bbb-3(b)(1), unless the authorization is terminated or revoked sooner.      Influenza A by PCR NEGATIVE NEGATIVE Final   Influenza B by PCR NEGATIVE NEGATIVE Final    Comment: (NOTE) The Xpert Xpress SARS-CoV-2/FLU/RSV assay is intended as an aid in  the diagnosis of influenza from Nasopharyngeal swab specimens and  should not be used as a sole basis for treatment. Nasal washings and  aspirates are unacceptable for Xpert Xpress SARS-CoV-2/FLU/RSV  testing.  Fact Sheet for Patients: [place]  Fact Sheet for Healthcare Providers: https://www.moore.com/  This test is not yet approved or cleared by the https://www.young.biz/ FDA and  has been authorized for detection and/or diagnosis of SARS-CoV-2 by  FDA under an Emergency Use Authorization (EUA). This EUA will remain  in effect (meaning this test can be used) for the duration of the  Covid-19 declaration under Section 564(b)(1) of the Act, 21  U.S.C. section 360bbb-3(b)(1), unless the authorization is  terminated or revoked. Performed at [facility], [address] [place], [place] [address]   Blood Culture (routine x 2)     Status: Abnormal   Collection Time: 06/22/20 11:58 AM   Specimen: Left Antecubital; Blood  Result Value Ref Range Status   Specimen Description   Final    LEFT ANTECUBITAL Performed at [facility], [address] [place], [place] [address]    Special Requests   Final    BOTTLES DRAWN AEROBIC AND ANAEROBIC Blood Culture adequate volume Performed at [facility], [address] [place], [place] [address]    Culture  Setup Time   Final    IN BOTH AEROBIC AND ANAEROBIC BOTTLES GRAM POSITIVE COCCI CRITICAL VALUE NOTED.  VALUE IS CONSISTENT WITH PREVIOUSLY REPORTED AND CALLED VALUE.    Culture (A)  Final    ENTEROCOCCUS FAECALIS SUSCEPTIBILITIES PERFORMED ON PREVIOUS  CULTURE WITHIN THE LAST 5 DAYS. Performed at [facility], [address], [place], [place] [address]    Report Status 06/25/2020 FINAL  Final  MRSA PCR Screening     Status: None   Collection Time: 06/23/20  8:08 AM   Specimen: Nasal Mucosa; Nasopharyngeal  Result Value Ref Range Status   MRSA by PCR NEGATIVE NEGATIVE Final    Comment:        The GeneXpert MRSA Assay (FDA approved for NASAL specimens only), is one component of a comprehensive MRSA colonization surveillance program. It is not intended to diagnose MRSA infection nor to guide or monitor treatment for MRSA infections. Performed at [facility], [address], [place], [place] [address]   Urine culture     Status: None   Collection Time: 06/23/20 12:01 PM   Specimen: Urine, Clean Catch  Result Value Ref Range Status   Specimen Description   Final    URINE, CLEAN CATCH Performed at [facility], [address] [place], [place] [address]    Special Requests   Final    NONE Performed at [facility], [address] [place], [place] [address]    Culture   Final    NO GROWTH Performed at [facility], [address], [place], [place] [address]    Report Status 06/24/2020 FINAL  Final  Culture, blood (routine x 2)     Status: None   Collection Time: 06/24/20  6:50 AM   Specimen: BLOOD LEFT HAND  Result Value Ref Range Status   Specimen Description BLOOD LEFT HAND  Final   Special Requests   Final    BOTTLES DRAWN AEROBIC AND ANAEROBIC Blood Culture adequate volume   Culture   Final    NO GROWTH 5 DAYS Performed at [facility], [address], [place], [place] [address]    Report Status 06/29/2020 FINAL  Final  Culture, blood (routine x 2)     Status: None   Collection Time: 06/24/20  6:50 AM   Specimen: BLOOD RIGHT HAND  Result Value Ref Range Status   Specimen Description BLOOD RIGHT HAND  Final   Special Requests   Final    BOTTLES DRAWN AEROBIC AND  ANAEROBIC Blood Culture adequate volume   Culture   Final    NO GROWTH 5 DAYS Performed at [facility], [address] [place], [place] [address]    Report Status 06/29/2020 FINAL  Final  Culture, respiratory (non-expectorated)     Status: None (Preliminary result)   Collection Time:  06/29/20 11:37 AM   Specimen: Tracheal Aspirate; Respiratory  Result Value Ref Range Status   Specimen Description TRACHEAL ASPIRATE  Final   Special Requests NONE  Final   Gram Stain   Final    FEW WBC PRESENT,BOTH PMN AND MONONUCLEAR RARE BUDDING YEAST SEEN    Culture   Final    CULTURE REINCUBATED FOR BETTER GROWTH Performed at [facility], [address] [place], [place] [id number]    Report Status PENDING  Incomplete    Impression/Plan:  1. Enterococcal bacteremia - repeat blood cultures remain negative.  TTE without obvious vegetation.  Unable to do TEE due to COVID.   Will change amp/sulbactam back to ampicillin alone.  Continue ceftriaxone.  Will need 6 weeks of IV antibiotics or TEE at some point.  2.  COVID positive - remains intubated.  Not progressing much.  Continue supportive treatments.    3.  GI bleed - stable at this time.  Dr. Drue SecondSnider available over the weekend if needed.  Dr. Elinor ParkinsonManandhar will follow up on Monday

## 2020-07-02 DIAGNOSIS — G9341 Metabolic encephalopathy: Secondary | ICD-10-CM | POA: Diagnosis not present

## 2020-07-02 DIAGNOSIS — J9600 Acute respiratory failure, unspecified whether with hypoxia or hypercapnia: Secondary | ICD-10-CM | POA: Diagnosis not present

## 2020-07-02 DIAGNOSIS — U071 COVID-19: Secondary | ICD-10-CM | POA: Diagnosis not present

## 2020-07-02 DIAGNOSIS — F1010 Alcohol abuse, uncomplicated: Secondary | ICD-10-CM | POA: Diagnosis not present

## 2020-07-02 LAB — BASIC METABOLIC PANEL
Anion gap: 6 (ref 5–15)
BUN: 12 mg/dL (ref 8–23)
CO2: 26 mmol/L (ref 22–32)
Calcium: 8.4 mg/dL — ABNORMAL LOW (ref 8.9–10.3)
Chloride: 109 mmol/L (ref 98–111)
Creatinine, Ser: 0.96 mg/dL (ref 0.61–1.24)
GFR, Estimated: >60 mL/min (ref >60)
Glucose, Bld: 128 mg/dL — ABNORMAL HIGH (ref 70–99)
Potassium: 4.3 mmol/L (ref 3.5–5.1)
Sodium: 141 mmol/L (ref 135–145)

## 2020-07-02 LAB — MAGNESIUM: Magnesium: 1.5 mg/dL — ABNORMAL LOW (ref 1.7–2.4)

## 2020-07-02 LAB — CBC
HCT: 26.3 % — ABNORMAL LOW (ref 39.0–52.0)
Hemoglobin: 8.1 g/dL — ABNORMAL LOW (ref 13.0–17.0)
MCH: 32.9 pg (ref 26.0–34.0)
MCHC: 30.8 g/dL (ref 30.0–36.0)
MCV: 106.9 fL — ABNORMAL HIGH (ref 80.0–100.0)
Platelets: 130 10*3/uL — ABNORMAL LOW (ref 150–400)
RBC: 2.46 MIL/uL — ABNORMAL LOW (ref 4.22–5.81)
RDW: 16 % — ABNORMAL HIGH (ref 11.5–15.5)
WBC: 9 10*3/uL (ref 4.0–10.5)
nRBC: 0 % (ref 0.0–0.2)

## 2020-07-02 LAB — GLUCOSE, CAPILLARY
Glucose-Capillary: 118 mg/dL — ABNORMAL HIGH (ref 70–99)
Glucose-Capillary: 117 mg/dL — ABNORMAL HIGH (ref 70–99)
Glucose-Capillary: 131 mg/dL — ABNORMAL HIGH (ref 70–99)
Glucose-Capillary: 136 mg/dL — ABNORMAL HIGH (ref 70–99)
Glucose-Capillary: 127 mg/dL — ABNORMAL HIGH (ref 70–99)
Glucose-Capillary: 123 mg/dL — ABNORMAL HIGH (ref 70–99)

## 2020-07-02 LAB — HEMOGLOBIN AND HEMATOCRIT, BLOOD
HCT: 26.6 % — ABNORMAL LOW (ref 39.0–52.0)
HCT: 25.2 % — ABNORMAL LOW (ref 39.0–52.0)
Hemoglobin: 8.4 g/dL — ABNORMAL LOW (ref 13.0–17.0)
Hemoglobin: 7.9 g/dL — ABNORMAL LOW (ref 13.0–17.0)

## 2020-07-02 LAB — TRIGLYCERIDES: Triglycerides: 74 mg/dL (ref <150)

## 2020-07-02 LAB — PHOSPHORUS: Phosphorus: 3.3 mg/dL (ref 2.5–4.6)

## 2020-07-02 MED ORDER — DEXMEDETOMIDINE HCL IN NACL 400 MCG/100ML IV SOLN
0.4000 ug/kg/h | INTRAVENOUS | Status: DC
  Administered 2020-07-02 – 2020-07-03 (×3): 0.4 ug/kg/h via INTRAVENOUS
  Administered 2020-07-03: 1.2 ug/kg/h via INTRAVENOUS
  Administered 2020-07-03: 1 ug/kg/h via INTRAVENOUS
  Administered 2020-07-03 (×2): 1.2 ug/kg/h via INTRAVENOUS
  Administered 2020-07-04 (×2): 1.1 ug/kg/h via INTRAVENOUS
  Administered 2020-07-04: 1.2 ug/kg/h via INTRAVENOUS
  Administered 2020-07-04: 0.6 ug/kg/h via INTRAVENOUS
  Administered 2020-07-04: 1.1 ug/kg/h via INTRAVENOUS
  Administered 2020-07-05 (×2): 1.4 ug/kg/h via INTRAVENOUS
  Administered 2020-07-05: 1.3 ug/kg/h via INTRAVENOUS
  Filled 2020-07-02 (×15): qty 100

## 2020-07-02 MED ORDER — ENOXAPARIN SODIUM 40 MG/0.4ML ~~LOC~~ SOLN
40.0000 mg | SUBCUTANEOUS | Status: DC
  Administered 2020-07-02 – 2020-07-03 (×2): 40 mg via SUBCUTANEOUS
  Filled 2020-07-02 (×2): qty 0.4

## 2020-07-02 MED ORDER — MAGNESIUM SULFATE 4 GM/100ML IV SOLN
4.0000 g | Freq: Once | INTRAVENOUS | Status: AC
  Administered 2020-07-02: 4 g via INTRAVENOUS
  Filled 2020-07-02: qty 100

## 2020-07-02 NOTE — Progress Notes (Signed)
Fentanyl turned off ~1320 in hopes of extubation later today.  ~1500 pt became very agitated/Desynchronous w/ vent, tachypnic, tachycardic.  Verbal instructions to turn fentanyl gtt back on for now.

## 2020-07-02 NOTE — Progress Notes (Signed)
Pharmacy Electrolyte Replacement  Recent Labs:  Recent Labs    07/02/20 0344  K 4.3  MG 1.5*  PHOS 3.3  CREATININE 0.96    Low Critical Values (K </= 2.5, Phos </= 1, Mg </= 1) Present: None  Plan: Mg 4 g x1  Elmer Sow, PharmD, BCPS, BCCCP Clinical Pharmacist [phone]  Please check AMION for all [facility] numbers  07/02/2020 10:12 AM

## 2020-07-02 NOTE — Progress Notes (Signed)
NAME:  Gregory GlazierJack S Gartrell, MRN:  [id number], DOB:  Aug 22, 1941, LOS: 10 ADMISSION DATE:  06/22/2020, CONSULTATION DATE:  06/29/20 REFERRING MD:  Thedore MinsSingh  CHIEF COMPLAINT:  Respiratory Arrest   Brief History   Gregory Wheeler is a 79 y.o. male who was admitted 10/27 with COVID PNA and possible aspiration PNA.  Started on remdesivir, decadron, unasyn.  Also started on CIWA protocol for DT's.   Early AM 11/3, had respiratory arrest and was subsequently intubated and transferred to the ICU.  History of present illness   Pt is encephalopathic; therefore, this HPI is obtained from chart review.  Gregory GlazierJack S Dorwart is a 79 y.o. male who has a PMH as outlined below.  He was admitted on 10/27 with COVID PNA after testing positive 10 days prior per notes (unsure when initial positive test was) and possible aspiration PNA.  [facility] and was started on decadron and remdesivir along with CIWA protocol given his hx of heavy EtOH use and unasyn for possible aspiration PNA.  He was vaccinated with Pfizer around April 2021. Blood cultures noteably positive for enterococcus.  Seen by ID who recommends amp + ceftriaxone x 6 weeks.  11/2 he was evaluated by GI for GIB.  Lovenox was stopped and protonix gtt started.  CTA was recommended if bleeding persisted. 11/2 was also evaluated by ENT for oral denture mucositis.  He was started on diflucan and unasyn added back.  During early AM hours 11/3, he was noted to have worsening hypoxia which progressed into agonal respirations.  SBP dropped to 60's; however, he never lost pulses.  He was bagged by RRT and was administered narcan after receiving morphine earlier in the night.  He had no response to narcan.  Code Blue was called for respiratory arrest.  He was intubated by RT and was transferred to 88M.  Upon arrival, multiple dried clots were suctioned out of his mouth.  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  Significant Hospital Events   10/27 > admit. 11/2 > GI consult for GIB, ENT consult for mucositis. 11/3 > respiratory arrest, intubated and transferred to ICU.  Consults:  GI, ENT, PCCM.  Procedures:  PICC 11/1 >  ETT 11/3 >   Significant Diagnostic Tests:    Micro Data:  Flu 10/27 > neg. COVID 10/27 > pos. Blood 10/27 > enterococcus. Urine 10/28 > neg. Blood 10/29 >   Antimicrobials:  Azithro 10/27 > 10/27 Ceftriaxone 10/27 >  Amp 10/28 > .  11/1 >   Interim history/subjective:  Tolerating 8/5 PSV trial this AM. Still not able to follow commands. Intermittent agitation.  Objective:  Blood pressure (!) 111/55, pulse 95, temperature 98 F (36.7 C), temperature source Axillary, resp. rate 15, height 5\' 11"  (1.803 m), weight 109.4 kg, SpO2 99 %.    Vent Mode: CPAP;PSV FiO2 (%):  [40 %] 40 % Set  Rate:  [22 bmp] 22 bmp Vt Set:  [450 mL] 450 mL PEEP:  [5 cmH20] 5 cmH20 Pressure Support:  [8 cmH20] 8 cmH20 Plateau Pressure:  [10 cmH20-15 cmH20] 12 cmH20   Intake/Output Summary (Last 24 hours) at 07/02/2020 1646 Last data filed at 07/02/2020 1600 Gross per 24 hour  Intake 1502.18 ml  Output 1325 ml  Net 177.18 ml   Filed Weights   06/30/20 0609 07/01/20 0246  07/02/20 0346  Weight: 109.8 kg 109.5 kg 109.4 kg    Examination: General: Chronically ill appearing elderly male lying in bed on mechanical ventilation, in NAD HEENT: ETT, MM pink/moist, PERRL,  Neuro: Sedated but will grimace to painful stimuli  CV: s1s2 regular rate and rhythm, no murmur, rubs, or gallops,  PULM:  Clear to ascultation, no added breath sounds, no increased work of breathing, tolerating vent  GI: soft, bowel sounds active in all 4 quadrants, non-tender, non-distended, Extremities: warm/dry, no edema  Skin: no rashes or lesions  Resolved problems:  Transient hypotension  Assessment & Plan:  Acute hypoxemic respiratory failure in setting of worsening delirium and likely aspiration event.  Continue ventilator support with lung protective strategies  Continue Daily SBT trials Wean sedation, switching from versed to precedex. Continue fentanyl Wean PEEP and FiO2 for sats greater than 90%. Head of bed elevated 30 degrees. Plateau pressures less than 30 cm H20.  Follow intermittent chest x-ray and ABG.   SAT/SBT as tolerated, mentation preclude extubation  Ensure adequate pulmonary hygiene  Follow cultures  VAP bundle in place  PAD protocol  Enterococcal bacteremia 10/27 -Culture negative 10/29 -No TEE, re-consider once out of infectious window for COVID -Likely will need 6 weeks of antibiotic therapy vs TEE P: Antibiotic plan per ID ID consulted, primary management   Acute metabolic encephalopathy with hx of heavy alcohol abuse and question of DTs vs. ICU-associated delirium P: Need to wean sedation  Delirium precautions  Supportive care  CIWA protocol   Question GIB  - steady decline in Hgb, received 1u pRBC overnight 11/3 to 11/4 for Hgb <7. Had evidence of red blood in OG tube AM 11/3.  P: GI consulted and now will be available as needed  Underwent upper EGD 11/4 with probable caustic gastritis from the NG tube. May resume tube feeds Continue BID  PPI   Oral yeast -Rare budding yeast seen on respiratory culture  P: Has received fluconazole x3 days  Will switch to oral nystatin today   Background of breakthrough COVID 19 infection - s/p remdesivir and steroids. Not contributing much to current picture. P: Supportive care   Hx CAD, RBBB, Mobitz 1 AVB, Afib - Baseline anticoagulated with PO Xarelto   P: Upper EGD negative for acute bleed Spoke with attending and decision was made to hold full dose lovenox for an additional day  Home beta blocker remains on hold   Best Practice:  Diet: tube feeds Pain/Anxiety/Delirium protocol (if indicated): see above VAP protocol (if indicated): ordered DVT prophylaxis: SCDs GI prophylaxis: PPI Glucose control: SSI Mobility: BR Code Status: Full Family Communication: Will attempt to update over the phone 11/6 Disposition: ICU   Labs   CBC: Recent Labs  Lab 06/27/20 0420 06/27/20 0420 06/28/20 0820 06/28/20 0820 06/28/20 1125 06/28/20 1125 06/28/20 2020 06/28/20 2020 06/29/20 0126 06/29/20 0352 06/29/20 0645 06/29/20 1235 06/30/20 0618 06/30/20 0830 06/30/20 2002 07/01/20 0237 07/01/20 1241 07/01/20 2041 07/02/20 0344  WBC 10.2   < > 12.8*   < > 10.7*   < >  11.5*   < > 18.6*  --  20.2*  --  8.0  --   --  8.7  --   --  9.0  NEUTROABS 9.3*  --  10.8*  --  9.0*  --  9.7*  --  14.8*  --   --   --   --   --   --   --   --   --   --   HGB 9.7*   < > 9.8*   < > 9.3*   < > 8.5*   < > 9.6*   < > 9.8*   < > 7.4*   < > 8.0* 8.6* 8.3* 9.0* 8.1*  HCT 29.9*   < > 31.0*   < > 29.1*   < > 26.3*   < > 30.9*   < > 31.1*   < > 22.4*   < > 24.8* 26.9* 26.5* 28.9* 26.3*  MCV 104.2*   < > 106.2*   < > 106.2*   < > 108.2*   < > 112.0*  --  109.9*  --  102.8*  --   --  105.5*  --   --  106.9*  PLT 247   < > 253   < > 221   < > 222   < > 260  --  287  --  144*  --   --  143*  --   --  130*   < > = values in this interval not displayed.   Basic Metabolic Panel: Recent Labs  Lab  06/26/20 0320 06/27/20 0420 06/29/20 0126 06/29/20 0352 06/29/20 1572 06/29/20 0645 06/30/20 0618 07/01/20 0237 07/02/20 0344  NA 140   < > 145   < > 144 144 142 142 141  K 3.8   < > 4.5   < > 4.7 4.9 3.2* 4.1 4.3  CL 106   < > 110  --   --  109 109 112* 109  CO2 26   < > 30  --   --  26 23 24 26   GLUCOSE 132*   < > 127*  --   --  125* 111* 112* 128*  BUN 17   < > 14  --   --  16 17 13 12   CREATININE 0.93   < > 1.13  --   --  1.29* 1.09 0.98 0.96  CALCIUM 8.8*   < > 8.8*  --   --  8.8* 8.7* 8.7* 8.4*  MG 1.5*   < > 1.8  --   --  1.8 1.5* 1.4* 1.5*  PHOS 3.1  --   --   --   --  3.2 2.4* 3.8 3.3   < > = values in this interval not displayed.   GFR: Estimated Creatinine Clearance: 79.7 mL/min (by C-G formula based on SCr of 0.96 mg/dL). Recent Labs  Lab 06/28/20 0820 06/28/20 1125 06/29/20 0126 06/29/20 0126 06/29/20 0645 06/30/20 0618 07/01/20 0237 07/02/20 0344  PROCALCITON <0.10  --  <0.10  --   --  0.17 <0.10  --   WBC 12.8*   < > 18.6*   < > 20.2* 8.0 8.7 9.0   < > = values in this interval not displayed.   Liver Function Tests: Recent Labs  Lab 06/27/20 0420 06/28/20 0820 06/29/20 0126 06/30/20 0618 07/01/20 0237  AST 76* 57* 36 23 32  ALT 82* 75* 66* 34 42  ALKPHOS 46 53 55  33* 46  BILITOT 0.8 0.8 0.5 1.0 0.5  PROT 4.9* 5.1* 5.1* 4.3* 4.4*  ALBUMIN 2.2* 2.3* 2.3* 2.5* 2.2*   No results for input(s): LIPASE, AMYLASE in the last 168 hours. No results for input(s): AMMONIA in the last 168 hours. ABG    Component Value Date/Time   PHART 7.520 (H) 06/29/2020 0633   PCO2ART 26.6 (L) 06/29/2020 0633   PO2ART 60 (L) 06/29/2020 0633   HCO3 25.4 06/30/2020 0900   TCO2 23 06/29/2020 0633   ACIDBASEDEF 1.0 06/29/2020 0633   O2SAT 84.0 06/30/2020 0900    Coagulation Profile: No results for input(s): INR, PROTIME in the last 168 hours. Cardiac Enzymes: No results for input(s): CKTOTAL, CKMB, CKMBINDEX, TROPONINI in the last 168 hours. HbA1C: Hgb A1c MFr  Bld  Date/Time Value Ref Range Status  06/29/2020 06:45 AM 5.9 (H) 4.8 - 5.6 % Final    Comment:    (NOTE) Pre diabetes:          5.7%-6.4%  Diabetes:              >6.4%  Glycemic control for   <7.0% adults with diabetes    CBG: Recent Labs  Lab 07/01/20 2041 07/01/20 2322 07/02/20 0341 07/02/20 1034 07/02/20 1328  GLUCAP 112* 98 118* 117* 131*    Critical care time:    Performed by: Martina Sinner  Total critical care time:  Critical care time was exclusive of separately billable procedures and treating other patients.  Critical care was necessary to treat or prevent imminent or life-threatening deterioration.  Critical care was time spent personally by me on the following activities: development of treatment plan with patient and/or surrogate as well as nursing, discussions with consultants, evaluation of patient's response to treatment, examination of patient, obtaining history from patient or surrogate, ordering and performing treatments and interventions, ordering and review of laboratory studies, ordering and review of radiographic studies, pulse oximetry and re-evaluation of patient's condition.   Melody Comas, MD [facility] Office: [phone]   See Amion for Pager Details

## 2020-07-02 NOTE — Progress Notes (Signed)
Spoke w/ pts son to provide updates. Pts son appreciative.  

## 2020-07-03 DIAGNOSIS — I482 Chronic atrial fibrillation, unspecified: Secondary | ICD-10-CM | POA: Diagnosis not present

## 2020-07-03 DIAGNOSIS — U071 COVID-19: Secondary | ICD-10-CM | POA: Diagnosis not present

## 2020-07-03 DIAGNOSIS — F1010 Alcohol abuse, uncomplicated: Secondary | ICD-10-CM | POA: Diagnosis not present

## 2020-07-03 DIAGNOSIS — G9341 Metabolic encephalopathy: Secondary | ICD-10-CM | POA: Diagnosis not present

## 2020-07-03 LAB — BASIC METABOLIC PANEL
Anion gap: 6 (ref 5–15)
BUN: 14 mg/dL (ref 8–23)
CO2: 26 mmol/L (ref 22–32)
Calcium: 8.9 mg/dL (ref 8.9–10.3)
Chloride: 111 mmol/L (ref 98–111)
Creatinine, Ser: 0.99 mg/dL (ref 0.61–1.24)
GFR, Estimated: >60 mL/min (ref >60)
Glucose, Bld: 131 mg/dL — ABNORMAL HIGH (ref 70–99)
Potassium: 4.5 mmol/L (ref 3.5–5.1)
Sodium: 143 mmol/L (ref 135–145)

## 2020-07-03 LAB — CBC
HCT: 26 % — ABNORMAL LOW (ref 39.0–52.0)
Hemoglobin: 8 g/dL — ABNORMAL LOW (ref 13.0–17.0)
MCH: 33.3 pg (ref 26.0–34.0)
MCHC: 30.8 g/dL (ref 30.0–36.0)
MCV: 108.3 fL — ABNORMAL HIGH (ref 80.0–100.0)
Platelets: 125 10*3/uL — ABNORMAL LOW (ref 150–400)
RBC: 2.4 MIL/uL — ABNORMAL LOW (ref 4.22–5.81)
RDW: 15.9 % — ABNORMAL HIGH (ref 11.5–15.5)
WBC: 8.9 10*3/uL (ref 4.0–10.5)
nRBC: 0 % (ref 0.0–0.2)

## 2020-07-03 LAB — PHOSPHORUS: Phosphorus: 3.1 mg/dL (ref 2.5–4.6)

## 2020-07-03 LAB — MAGNESIUM: Magnesium: 1.8 mg/dL (ref 1.7–2.4)

## 2020-07-03 LAB — GLUCOSE, CAPILLARY
Glucose-Capillary: 109 mg/dL — ABNORMAL HIGH (ref 70–99)
Glucose-Capillary: 142 mg/dL — ABNORMAL HIGH (ref 70–99)
Glucose-Capillary: 105 mg/dL — ABNORMAL HIGH (ref 70–99)
Glucose-Capillary: 142 mg/dL — ABNORMAL HIGH (ref 70–99)
Glucose-Capillary: 126 mg/dL — ABNORMAL HIGH (ref 70–99)

## 2020-07-03 LAB — HEMOGLOBIN AND HEMATOCRIT, BLOOD
HCT: 25.9 % — ABNORMAL LOW (ref 39.0–52.0)
HCT: 28.5 % — ABNORMAL LOW (ref 39.0–52.0)
Hemoglobin: 8.2 g/dL — ABNORMAL LOW (ref 13.0–17.0)
Hemoglobin: 8.8 g/dL — ABNORMAL LOW (ref 13.0–17.0)

## 2020-07-03 MED ORDER — MIDAZOLAM HCL 2 MG/2ML IJ SOLN
2.0000 mg | INTRAMUSCULAR | Status: DC | PRN
  Administered 2020-07-03 – 2020-07-05 (×5): 2 mg via INTRAVENOUS
  Filled 2020-07-03 (×5): qty 2

## 2020-07-03 MED ORDER — FENTANYL BOLUS VIA INFUSION
50.0000 ug | INTRAVENOUS | Status: DC | PRN
  Administered 2020-07-03: 50 ug via INTRAVENOUS
  Filled 2020-07-03: qty 50

## 2020-07-03 MED ORDER — ENOXAPARIN SODIUM 100 MG/ML ~~LOC~~ SOLN
100.0000 mg | Freq: Two times a day (BID) | SUBCUTANEOUS | Status: DC
  Administered 2020-07-04 – 2020-07-12 (×17): 100 mg via SUBCUTANEOUS
  Filled 2020-07-03 (×17): qty 1

## 2020-07-03 NOTE — Progress Notes (Signed)
NAME:  Gregory Wheeler, MRN:  [id number], DOB:  Nov 24, 1940, LOS: 11 ADMISSION DATE:  06/22/2020, CONSULTATION DATE:  06/29/20 REFERRING MD:  Thedore Mins  CHIEF COMPLAINT:  Respiratory Arrest   Brief History   Gregory Wheeler is a 79 y.o. male who was admitted 10/27 with COVID PNA and possible aspiration PNA.  Started on remdesivir, decadron, unasyn.  Also started on CIWA protocol for DT's.   Early AM 11/3, had respiratory arrest and was subsequently intubated and transferred to the ICU.  History of present illness   Pt is encephalopathic; therefore, this HPI is obtained from chart review.  Gregory Wheeler is a 79 y.o. male who has a PMH as outlined below.  He was admitted on 10/27 with COVID PNA after testing positive 10 days prior per notes (unsure when initial positive test was) and possible aspiration PNA.  He was admitted by [facility] and was started on decadron and remdesivir along with CIWA protocol given his hx of heavy EtOH use and unasyn for possible aspiration PNA.  He was vaccinated with Pfizer around April 2021. Blood cultures noteably positive for enterococcus.  Seen by ID who recommends amp + ceftriaxone x 6 weeks.  11/2 he was evaluated by GI for GIB.  Lovenox was stopped and protonix gtt started.  CTA was recommended if bleeding persisted. 11/2 was also evaluated by ENT for oral denture mucositis.  He was started on diflucan and unasyn added back.  During early AM hours 11/3, he was noted to have worsening hypoxia which progressed into agonal respirations.  SBP dropped to 60's; however, he never lost pulses.  He was bagged by RRT and was administered narcan after receiving morphine earlier in the night.  He had no response to narcan.  Code Blue was called for respiratory arrest.  He was intubated by RT and was transferred to 17M.  Upon arrival, multiple dried clots were suctioned out of his mouth.  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  Significant Hospital Events   10/27 > admit. 11/2 > GI consult for GIB, ENT consult for mucositis. 11/3 > respiratory arrest, intubated and transferred to ICU.  Consults:  GI, ENT, PCCM.  Procedures:  PICC 11/1 >  ETT 11/3 >   Significant Diagnostic Tests:    Micro Data:  Flu 10/27 > neg. COVID 10/27 > pos. Blood 10/27 > enterococcus. Urine 10/28 > neg. Blood 10/29 >   Antimicrobials:  Azithro 10/27 > 10/27 Ceftriaxone 10/27 >  Amp 10/28 > .  11/1 >   Interim history/subjective:  Tolerating 10/5 PSV trial this AM. Still not able to follow commands. Weaning fentanyl increasing precedex  Objective:  Blood pressure 138/63, pulse 62, temperature 98.3 F (36.8 C), temperature source Axillary, resp. rate (!) 21, height 5\' 11"  (1.803 m), weight 112.5 kg, SpO2 98 %.    Vent Mode: CPAP;PSV FiO2 (%):  [40 %] 40 %  Set Rate:  [22 bmp] 22 bmp Vt Set:  [450 mL] 450 mL PEEP:  [5 cmH20] 5 cmH20 Pressure Support:  [8 cmH20-10 cmH20] 10 cmH20 Plateau Pressure:  [12 cmH20-14 cmH20] 14 cmH20   Intake/Output Summary (Last 24 hours) at 07/03/2020 1416 Last data filed at 07/03/2020 1200 Gross per 24 hour  Intake 4736.19 ml  Output 870 ml  Net 3866.19 ml   Filed Weights    07/01/20 0246 07/02/20 0346 07/03/20 0334  Weight: 109.5 kg 109.4 kg 112.5 kg    Examination: General: Chronically ill appearing elderly male lying in bed on mechanical ventilation, in NAD HEENT: ETT, MM pink/moist, PERRL,  Neuro: Sedated but will grimace to painful stimuli  CV: s1s2 regular rate and rhythm, no murmur, rubs, or gallops,  PULM:  Clear to ascultation, no added breath sounds, no increased work of breathing, tolerating vent  GI: soft, bowel sounds active in all 4 quadrants, non-tender, non-distended, Extremities: warm/dry, no edema  Skin: no rashes or lesions  Resolved problems:  Transient hypotension  Assessment & Plan:  Acute hypoxemic respiratory failure in setting of worsening delirium and likely aspiration event.  Continue ventilator support with lung protective strategies - tolerating PSV Continue Daily SBT trials Precedex, d/c fentanyl drip to aid in wean VAP bundle in place  PAD protocol  Enterococcal bacteremia 10/27 -Culture negative 10/29 -No TEE, re-consider once out of infectious window for COVID -Likely will need 6 weeks of antibiotic therapy vs TEE P: Antibiotic plan per ID ID consulted, primary management   Acute metabolic encephalopathy with hx of heavy alcohol abuse and question of DTs vs. ICU-associated delirium P: Precedex  Question GIB  - steady decline in Hgb, received 1u pRBC overnight 11/3 to 11/4 for Hgb <7. Had evidence of red blood in OG tube AM 11/3.  P: GI consulted and now will be available as needed  Underwent upper EGD 11/4 with probable caustic gastritis from the NG tube. May resume tube feeds Continue BID PPI    Background of breakthrough COVID 19 infection - s/p remdesivir and steroids. Not contributing much to current picture. P: Supportive care   Hx CAD, RBBB, Mobitz 1 AVB, Afib - Baseline anticoagulated with PO Xarelto   P: Upper EGD negative for acute bleed Resume full anticoagulation with lovenox 11/8  Best  Practice:  Diet: tube feeds Pain/Anxiety/Delirium protocol (if indicated): see above VAP protocol (if indicated): ordered DVT prophylaxis: SCDs GI prophylaxis: PPI Glucose control: SSI Mobility: BR Code Status: Full Family Communication: Will attempt to update over the phone 11/6 Disposition: ICU   Labs   CBC: Recent Labs  Lab 06/27/20 0420 06/27/20 0420 06/28/20 0820 06/28/20 0820 06/28/20 1125 06/28/20 1125 06/28/20 2020 06/28/20 2020 06/29/20 0126 06/29/20 0352 06/29/20 0645 06/29/20 1235 06/30/20 0618 06/30/20 0830 07/01/20 0237 07/01/20 1241 07/01/20 2041 07/02/20 0344 07/02/20 1704 07/02/20 2107 07/03/20 0339  WBC 10.2   < > 12.8*   < > 10.7*   < > 11.5*   < > 18.6*   < > 20.2*  --  8.0  --  8.7  --   --  9.0  --   --  8.9  NEUTROABS 9.3*  --  10.8*  --  9.0*  --  9.7*  --  14.8*  --   --   --   --   --   --   --   --   --   --   --   --   HGB 9.7*   < >  9.8*   < > 9.3*   < > 8.5*   < > 9.6*   < > 9.8*   < > 7.4*   < > 8.6*   < > 9.0* 8.1* 8.4* 7.9* 8.0*  HCT 29.9*   < > 31.0*   < > 29.1*   < > 26.3*   < > 30.9*   < > 31.1*   < > 22.4*   < > 26.9*   < > 28.9* 26.3* 26.6* 25.2* 26.0*  MCV 104.2*   < > 106.2*   < > 106.2*   < > 108.2*   < > 112.0*   < > 109.9*  --  102.8*  --  105.5*  --   --  106.9*  --   --  108.3*  PLT 247   < > 253   < > 221   < > 222   < > 260   < > 287  --  144*  --  143*  --   --  130*  --   --  125*   < > = values in this interval not displayed.   Basic Metabolic Panel: Recent Labs  Lab 06/29/20 0645 06/30/20 0618 07/01/20 0237 07/02/20 0344 07/03/20 0339  NA 144 142 142 141 143  K 4.9 3.2* 4.1 4.3 4.5  CL 109 109 112* 109 111  CO2 26 23 24 26 26   GLUCOSE 125* 111* 112* 128* 131*  BUN 16 17 13 12 14   CREATININE 1.29* 1.09 0.98 0.96 0.99  CALCIUM 8.8* 8.7* 8.7* 8.4* 8.9  MG 1.8 1.5* 1.4* 1.5* 1.8  PHOS 3.2 2.4* 3.8 3.3 3.1   GFR: Estimated Creatinine Clearance: 78.5 mL/min (by C-G formula based on SCr of 0.99 mg/dL). Recent  Labs  Lab 06/28/20 0820 06/28/20 1125 06/29/20 0126 06/29/20 0645 06/30/20 0618 07/01/20 0237 07/02/20 0344 07/03/20 0339  PROCALCITON <0.10  --  <0.10  --  0.17 <0.10  --   --   WBC 12.8*   < > 18.6*   < > 8.0 8.7 9.0 8.9   < > = values in this interval not displayed.   Liver Function Tests: Recent Labs  Lab 06/27/20 0420 06/28/20 0820 06/29/20 0126 06/30/20 0618 07/01/20 0237  AST 76* 57* 36 23 32  ALT 82* 75* 66* 34 42  ALKPHOS 46 53 55 33* 46  BILITOT 0.8 0.8 0.5 1.0 0.5  PROT 4.9* 5.1* 5.1* 4.3* 4.4*  ALBUMIN 2.2* 2.3* 2.3* 2.5* 2.2*   No results for input(s): LIPASE, AMYLASE in the last 168 hours. No results for input(s): AMMONIA in the last 168 hours. ABG    Component Value Date/Time   PHART 7.520 (H) 06/29/2020 0633   PCO2ART 26.6 (L) 06/29/2020 0633   PO2ART 60 (L) 06/29/2020 0633   HCO3 25.4 06/30/2020 0900   TCO2 23 06/29/2020 0633   ACIDBASEDEF 1.0 06/29/2020 0633   O2SAT 84.0 06/30/2020 0900    Coagulation Profile: No results for input(s): INR, PROTIME in the last 168 hours. Cardiac Enzymes: No results for input(s): CKTOTAL, CKMB, CKMBINDEX, TROPONINI in the last 168 hours. HbA1C: Hgb A1c MFr Bld  Date/Time Value Ref Range Status  06/29/2020 06:45 AM 5.9 (H) 4.8 - 5.6 % Final    Comment:    (NOTE) Pre diabetes:          5.7%-6.4%  Diabetes:              >6.4%  Glycemic control for   <  7.0% adults with diabetes    CBG: Recent Labs  Lab 07/02/20 2104 07/02/20 2304 07/03/20 0332 07/03/20 0938 07/03/20 1242  GLUCAP 127* 123* 109* 142* 105*    Critical care time:    Performed by: Karren Burly  Total critical care time: 30 minutes  Critical care time was exclusive of separately billable procedures and treating other patients.  Critical care was necessary to treat or prevent imminent or life-threatening deterioration.  Critical care was time spent personally by me on the following activities: development of treatment plan  with patient and/or surrogate as well as nursing, discussions with consultants, evaluation of patient's response to treatment, examination of patient, obtaining history from patient or surrogate, ordering and performing treatments and interventions, ordering and review of laboratory studies, ordering and review of radiographic studies, pulse oximetry and re-evaluation of patient's condition.

## 2020-07-03 NOTE — Progress Notes (Addendum)
Spoke w/ pts son to provide updates. Pts son appreciative.  

## 2020-07-03 NOTE — Progress Notes (Signed)
Pt returned to full support settings due to desaturation to 72%. Pt agitated and disconnected self from ventilator at the HME site. Upon RT arrival into pt room, pt reconnected to vent, placed on full support settings and turned to 100%. Pt SpO2 quickly rose and work of breathing improved. Copious yellow, thick secretions suctioned from ETT and from subglottic. RN made aware of event/chagnes. RT will continue to monitor and be available if needed.

## 2020-07-04 ENCOUNTER — Encounter (HOSPITAL_COMMUNITY): Payer: Self-pay | Admitting: Gastroenterology

## 2020-07-04 DIAGNOSIS — Z9911 Dependence on respirator [ventilator] status: Secondary | ICD-10-CM

## 2020-07-04 DIAGNOSIS — R7881 Bacteremia: Secondary | ICD-10-CM | POA: Diagnosis not present

## 2020-07-04 DIAGNOSIS — B952 Enterococcus as the cause of diseases classified elsewhere: Secondary | ICD-10-CM | POA: Diagnosis not present

## 2020-07-04 LAB — BASIC METABOLIC PANEL
Anion gap: 8 (ref 5–15)
BUN: 16 mg/dL (ref 8–23)
CO2: 25 mmol/L (ref 22–32)
Calcium: 8.5 mg/dL — ABNORMAL LOW (ref 8.9–10.3)
Chloride: 108 mmol/L (ref 98–111)
Creatinine, Ser: 0.81 mg/dL (ref 0.61–1.24)
GFR, Estimated: >60 mL/min (ref >60)
Glucose, Bld: 151 mg/dL — ABNORMAL HIGH (ref 70–99)
Potassium: 4.7 mmol/L (ref 3.5–5.1)
Sodium: 141 mmol/L (ref 135–145)

## 2020-07-04 LAB — CBC WITH DIFFERENTIAL/PLATELET
Abs Immature Granulocytes: 0.11 10*3/uL — ABNORMAL HIGH (ref 0.00–0.07)
Basophils Absolute: 0 10*3/uL (ref 0.0–0.1)
Basophils Relative: 0 %
Eosinophils Absolute: 0.3 10*3/uL (ref 0.0–0.5)
Eosinophils Relative: 3 %
HCT: 26.3 % — ABNORMAL LOW (ref 39.0–52.0)
Hemoglobin: 8.3 g/dL — ABNORMAL LOW (ref 13.0–17.0)
Immature Granulocytes: 1 %
Lymphocytes Relative: 8 %
Lymphs Abs: 0.7 10*3/uL (ref 0.7–4.0)
MCH: 34.2 pg — ABNORMAL HIGH (ref 26.0–34.0)
MCHC: 31.6 g/dL (ref 30.0–36.0)
MCV: 108.2 fL — ABNORMAL HIGH (ref 80.0–100.0)
Monocytes Absolute: 0.8 10*3/uL (ref 0.1–1.0)
Monocytes Relative: 9 %
Neutro Abs: 6.9 10*3/uL (ref 1.7–7.7)
Neutrophils Relative %: 79 %
Platelets: 146 10*3/uL — ABNORMAL LOW (ref 150–400)
RBC: 2.43 MIL/uL — ABNORMAL LOW (ref 4.22–5.81)
RDW: 15.4 % (ref 11.5–15.5)
WBC: 8.7 10*3/uL (ref 4.0–10.5)
nRBC: 0 % (ref 0.0–0.2)

## 2020-07-04 LAB — GLUCOSE, CAPILLARY
Glucose-Capillary: 85 mg/dL (ref 70–99)
Glucose-Capillary: 134 mg/dL — ABNORMAL HIGH (ref 70–99)
Glucose-Capillary: 112 mg/dL — ABNORMAL HIGH (ref 70–99)
Glucose-Capillary: 134 mg/dL — ABNORMAL HIGH (ref 70–99)
Glucose-Capillary: 139 mg/dL — ABNORMAL HIGH (ref 70–99)
Glucose-Capillary: 127 mg/dL — ABNORMAL HIGH (ref 70–99)

## 2020-07-04 LAB — MAGNESIUM: Magnesium: 1.3 mg/dL — ABNORMAL LOW (ref 1.7–2.4)

## 2020-07-04 MED ORDER — FUROSEMIDE 10 MG/ML IJ SOLN: 20.0000 mg | Freq: Once | INTRAMUSCULAR | Status: DC

## 2020-07-04 MED ORDER — FUROSEMIDE 10 MG/ML IJ SOLN
40.0000 mg | Freq: Once | INTRAMUSCULAR | Status: AC
  Administered 2020-07-04: 40 mg via INTRAVENOUS
  Filled 2020-07-04: qty 4

## 2020-07-04 MED ORDER — FENTANYL CITRATE (PF) 100 MCG/2ML IJ SOLN: 50.0000 ug | INTRAMUSCULAR | Status: DC | PRN

## 2020-07-04 MED ORDER — THIAMINE HCL 100 MG PO TABS
100.0000 mg | ORAL_TABLET | Freq: Every day | ORAL | Status: DC
  Administered 2020-07-05 – 2020-07-12 (×8): 100 mg
  Filled 2020-07-04 (×8): qty 1

## 2020-07-04 MED ORDER — MAGNESIUM SULFATE 4 GM/100ML IV SOLN
4.0000 g | Freq: Once | INTRAVENOUS | Status: AC
  Administered 2020-07-04: 4 g via INTRAVENOUS
  Filled 2020-07-04: qty 100

## 2020-07-04 MED ORDER — POLYETHYLENE GLYCOL 3350 17 G PO PACK: 17.0000 g | PACK | Freq: Once | ORAL | Status: DC

## 2020-07-04 MED ORDER — PANTOPRAZOLE SODIUM 40 MG PO PACK
40.0000 mg | PACK | Freq: Two times a day (BID) | ORAL | Status: DC
  Administered 2020-07-04 – 2020-07-12 (×17): 40 mg
  Filled 2020-07-04 (×17): qty 20

## 2020-07-04 MED ORDER — MAGNESIUM SULFATE 2 GM/50ML IV SOLN
2.0000 g | Freq: Once | INTRAVENOUS | Status: AC
  Administered 2020-07-04: 2 g via INTRAVENOUS
  Filled 2020-07-04: qty 50

## 2020-07-04 NOTE — Progress Notes (Addendum)
Physical Therapy Treatment/Re-evaluation Patient Details Name: Gregory Wheeler MRN: [id number] DOB: [birth year] Today's Date: 07/04/2020    History of Present Illness Pt is 79 yo male presenting to ED 10/27 with generalized weakness, poor food intake, and confusion. COVID positive. 11/3 pt with respiratory arrest and intubated also noted to have GIB. 11/4 EGD performed. PMH including arthritis, A-fib, HTN, AKI, alcohol abuse, and CAD with s/p stenting of L anterior descending artery.    PT Comments    Pt presents to PT with increased lethargy secondary to medical status and medication. Pt was able to respond with general grimaces with passively turning his head towards the RT, sternal rubs, and the PT opening his eyes. Pt was unable to voluntarily keep his eyes open for longer than 5 seconds. During PROM, pt resisted movement and showed isometric strength, but was unable to follow range of motion commands with verbal and tactile cueing. Pt would benefit from skilled therapy in order to work on bed mobility, strength, ROM, and cognition in order to return towards independence.    Follow Up Recommendations  LTACH     Equipment Recommendations  Rolling walker with 5" wheels;3in1 (PT);Hospital bed;Wheelchair (measurements PT);Wheelchair cushion (measurements PT)    Recommendations for Other Services       Precautions / Restrictions Precautions Precaution Comments: ETT, flexi-seal, vent, bilateral mittens, cortrak Restrictions Weight Bearing Restrictions: No    Mobility  Bed Mobility               General bed mobility comments: unable to perform bed mobility due to lethargy and inability to participate in therapy  Transfers                    Ambulation/Gait                 Stairs             Wheelchair Mobility    Modified Rankin (Stroke Patients Only)       Balance       Sitting balance - Comments: unable to assess secondary to lethargy and  inability to participate in therapy                                    Cognition Arousal/Alertness: Lethargic;Suspect due to medications Behavior During Therapy:  (lethargic; in and out of being awake) Overall Cognitive Status: Impaired/Different from baseline Area of Impairment: Orientation;Attention;Memory;Following commands;Safety/judgement;Awareness;Problem solving                 Orientation Level:  (unable to assess)             General Comments: pt lethargic and would only respond inconsitently to extremity movement and sternal rubs      Exercises General Exercises - Upper Extremity Shoulder Flexion: PROM;Right;Left;10 reps Elbow Flexion: PROM;Right;Left;10 reps General Exercises - Lower Extremity Heel Slides: AAROM;Right;Left;5 reps Hip ABduction/ADduction: PROM;Right;Left;10 reps Toe Raises: AROM;Right;Other (comment) (3 reps) Other Exercises Other Exercises: PROM supination/pronation x10 on the LT and RT    General Comments        Pertinent Vitals/Pain Pain Assessment:  (CPOT of 4)    Home Living                      Prior Function            PT Goals (current goals can now be found in the care  plan section) Acute Rehab PT Goals PT Goal Formulation: Patient unable to participate in goal setting Time For Goal Achievement: 07/18/20 Potential to Achieve Goals: Fair Progress towards PT goals: Not progressing toward goals - comment;Goals downgraded-see care plan (pts medical status has changed)    Frequency    Min 2X/week      PT Plan Discharge plan needs to be updated;Frequency needs to be updated    Co-evaluation              AM-PAC PT "6 Clicks" Mobility   Outcome Measure  Help needed turning from your back to your side while in a flat bed without using bedrails?: Total Help needed moving from lying on your back to sitting on the side of a flat bed without using bedrails?: Total Help needed moving to and  from a bed to a chair (including a wheelchair)?: Total Help needed standing up from a chair using your arms (e.g., wheelchair or bedside chair)?: Total Help needed to walk in hospital room?: Total Help needed climbing 3-5 steps with a railing? : Total 6 Click Score: 6    End of Session   Activity Tolerance: Patient limited by lethargy (pt wasn't able to participate in therapy secondary to lethargy) Patient left: in bed;with nursing/sitter in room Nurse Communication: Mobility status PT Visit Diagnosis: Muscle weakness (generalized) (M62.81);Unsteadiness on feet (R26.81);Other abnormalities of gait and mobility (R26.89)     Time: 9449-6759 PT Time Calculation (min) (ACUTE ONLY): 17 min  Charges:                        Jeri Cos, SPT [id number]   Caterine Mcmeans 07/04/2020, 1:33 PM

## 2020-07-04 NOTE — Progress Notes (Signed)
eLink Physician-Brief Progress Note Patient Name: Gregory Wheeler DOB: 10/05/1940 MRN: [id number]   Date of Service  07/04/2020  HPI/Events of Note  Patient needs a.m. labs ordered.  eICU Interventions   a.m. labs ordered.        Thomasene Lot Kevon Tench 07/04/2020, 3:34 AM

## 2020-07-04 NOTE — Progress Notes (Signed)
Transport pt to new room  

## 2020-07-04 NOTE — Progress Notes (Signed)
eLink Physician-Brief Progress Note Patient Name: Gregory Wheeler DOB: [birth year] MRN: [id number]   Date of Service  07/04/2020  HPI/Events of Note  Patient needs a Flexiseal order for frequent, watery stools.  eICU Interventions  Flexiseal ordered.        Thomasene Lot Justo Hengel 07/04/2020, 3:59 AM

## 2020-07-04 NOTE — Progress Notes (Addendum)
Pharmacy Electrolyte Replacement  Recent Labs:  Recent Labs    07/03/20 0339 07/03/20 0339 07/04/20 0513  K 4.5   < > 4.7  MG 1.8   < > 1.3*  PHOS 3.1  --   --   CREATININE 0.99   < > 0.81   < > = values in this interval not displayed.    Low Critical Values (K </= 2.5, Phos </= 1, Mg </= 1) Present: None  MD Contacted: Dr. Gaynell Face  Plan: Give 4 g magnesium sulfate IV followed by 2 g magnesium sulfate IV for a total of 6 g magnesium sulfate IV. Recheck a Mg level tonight at 2000.  Sanda Klein, PharmD, RPh  PGY-1 Pharmacy Resident 07/04/2020 7:33 AM  Please check [email or web address] for unit-specific pharmacy phone numbers.

## 2020-07-04 NOTE — Procedures (Signed)
Cortrak ° °Tube Type:  Cortrak - 43 inches °Tube Location:  Left nare °Initial Placement:  Stomach °Secured by: Bridle °Technique Used to Measure Tube Placement:  Documented cm marking at nare/ corner of mouth °Cortrak Secured At:  70 cm ° ° ° °Cortrak Tube Team Note: ° °Consult received to place a Cortrak feeding tube.  ° °No x-ray is required. RN may begin using tube.  ° °If the tube becomes dislodged please keep the tube and contact the Cortrak team at [email or web address] (password [email or web address]) for replacement.  °If after hours and replacement cannot be delayed, place a NG tube and confirm placement with an abdominal x-ray.  ° ° °Liba Hulsey MS, RD, LDN °Please refer to AMION for RD and/or RD on-call/weekend/after hours pager ° °

## 2020-07-04 NOTE — Progress Notes (Signed)
eLink Physician-Brief Progress Note Patient Name: Gregory Wheeler DOB: 04-Jun-1941 MRN: [id number]   Date of Service  07/04/2020  HPI/Events of Note  Notified that patient agitated and seen flailing his arms Fentanyl drip discontinued. Still on Precedex at 1.2  eICU Interventions  Will increase Precedex to 1.4 for now as requested Changed Fentanyl prn to intermittent dosing instead of by bolus     Intervention Category Minor Interventions: Agitation / anxiety - evaluation and management  Darl Pikes 07/04/2020, 9:45 PM

## 2020-07-04 NOTE — Progress Notes (Addendum)
[facility] Infectious Diseases Follow Up Note  Patient Identification: Patient Name: Gregory Wheeler MRN: [id number] Admit Date: 06/22/2020 11:04 AM Age: 79 y.o.Today's Date: 07/04/2020   Reason for Visit: Follow up on Enterococcal bacteremia  Active Problems:   Coronary artery disease   Hypertension   RBBB (right bundle branch block)   AKI (acute kidney injury) (HCC)   Chronic atrial fibrillation (HCC)   Alcohol abuse   Long term (current) use of anticoagulants [Z79.01]   Pneumonia due to COVID-19 virus   Dehydration   Acute respiratory failure due to COVID-19 (HCC)   Hypokalemia   Acute metabolic encephalopathy   Denture stomatitis   Protein-calorie malnutrition, severe   Assessment/Recommendations  Acute Hypoxemic Respiratory Failure in the setting of COVID - Management per PCCM  High Grade E faecalis bacteremia - Unclear source. GI source is a possibility given h/o GIB. Repeat blood cx have been negative Urine Cx NGTD.  TTE is negative for vegetations. TEE to definitely r/o endocarditis is pending at this time. Patient to remain of Ampicillin and ceftriaxone as is for now until TEE can be done or ELSE will need to continue dual therapy for 6 weeks total.   COVID 19 isolation precautions per Infection Control  Monitor CBC, CMP while on IV abx at least twice weekly  Please call us back when TEE is done.  Rest of the management as per the primary team. Thank you for the consult. Please page with pertinent questions or concerns.  Odette Fraction, MD Infectious Diseases  [facility]   To contact the attending provider between 8A-5P or the covering provider during after hours 5P-8A, please log into the web site [email or web address] and access using universal  password for that web site. If you do not have the password, please call the hospital  operator. ______________________________________________________________________ Subjective patient seen and examined at the bedside. He is on precedex, on vent support. Non responsive. Off pressors.   Vitals BP (!) 152/75   Pulse 72   Temp 99.1 F (37.3 C) (Oral)   Resp (!) 24   Ht 5\' 11"  (1.803 m)   Wt 112.8 kg   SpO2 100%   BMI 34.68 kg/m     Physical Exam Constitutional:  Obese     Comments: ventilated   Cardiovascular:     Rate and Rhythm: Normal rate and regular rhythm.   Pulmonary:     Effort: Mostly clear anteriorly    Comments:   Abdominal:     Palpations: Abdomen is soft.     Tenderness:   Musculoskeletal:        General: No swelling  Skin:    Comments: no obvious rashes   Neurological: Pupils reactive  Psychiatric:        Mood and Affect:  LINES/TUBES: PICC in left arm   METAL IMPLANT/HARDWARE:   Pertinent Microbiology Results for orders placed or performed during the hospital encounter of 06/22/20  Blood Culture (routine x 2)     Status: Abnormal   Collection Time: 06/22/20 11:41 AM   Specimen: Right Antecubital; Blood  Result Value Ref Range Status   Specimen Description   Final    RIGHT ANTECUBITAL Performed at [facility], [address] [place], [place]    Special Requests   Final    BOTTLES DRAWN AEROBIC AND ANAEROBIC Blood Culture adequate volume Performed at [facility], [address] [place], [place]  Culture  Setup Time   Final    IN BOTH AEROBIC AND ANAEROBIC BOTTLES GRAM POSITIVE COCCI Organism ID to follow CRITICAL RESULT CALLED TO, READ BACK BY AND VERIFIED WITH: Lana Fish PharmD 8:25 06/23/20 ([email or web address]) Performed at [facility], [address], [place], [place] [address]    Culture ENTEROCOCCUS FAECALIS (A)  Final   Report Status 06/25/2020 FINAL  Final   Organism ID, Bacteria ENTEROCOCCUS FAECALIS  Final      Susceptibility   Enterococcus faecalis - MIC*     AMPICILLIN <=2 SENSITIVE Sensitive     VANCOMYCIN 2 SENSITIVE Sensitive     GENTAMICIN SYNERGY SENSITIVE Sensitive     * ENTEROCOCCUS FAECALIS  Blood Culture ID Panel (Reflexed)     Status: Abnormal   Collection Time: 06/22/20 11:41 AM  Result Value Ref Range Status   Enterococcus faecalis DETECTED (A) NOT DETECTED Final    Comment: CRITICAL RESULT CALLED TO, READ BACK BY AND VERIFIED WITH: Lana Fish PharmD 8:25 06/23/20 ([email or web address])    Enterococcus Faecium NOT DETECTED NOT DETECTED Final   Listeria monocytogenes NOT DETECTED NOT DETECTED Final   Staphylococcus species NOT DETECTED NOT DETECTED Final   Staphylococcus aureus (BCID) NOT DETECTED NOT DETECTED Final   Staphylococcus epidermidis NOT DETECTED NOT DETECTED Final   Staphylococcus lugdunensis NOT DETECTED NOT DETECTED Final   Streptococcus species NOT DETECTED NOT DETECTED Final   Streptococcus agalactiae NOT DETECTED NOT DETECTED Final   Streptococcus pneumoniae NOT DETECTED NOT DETECTED Final   Streptococcus pyogenes NOT DETECTED NOT DETECTED Final   A.calcoaceticus-baumannii NOT DETECTED NOT DETECTED Final   Bacteroides fragilis NOT DETECTED NOT DETECTED Final   Enterobacterales NOT DETECTED NOT DETECTED Final   Enterobacter cloacae complex NOT DETECTED NOT DETECTED Final   Escherichia coli NOT DETECTED NOT DETECTED Final   Klebsiella aerogenes NOT DETECTED NOT DETECTED Final   Klebsiella oxytoca NOT DETECTED NOT DETECTED Final   Klebsiella pneumoniae NOT DETECTED NOT DETECTED Final   Proteus species NOT DETECTED NOT DETECTED Final   Salmonella species NOT DETECTED NOT DETECTED Final   Serratia marcescens NOT DETECTED NOT DETECTED Final   Haemophilus influenzae NOT DETECTED NOT DETECTED Final   Neisseria meningitidis NOT DETECTED NOT DETECTED Final   Pseudomonas aeruginosa NOT DETECTED NOT DETECTED Final   Stenotrophomonas maltophilia NOT DETECTED NOT DETECTED Final   Candida albicans NOT DETECTED NOT DETECTED Final    Candida auris NOT DETECTED NOT DETECTED Final   Candida glabrata NOT DETECTED NOT DETECTED Final   Candida krusei NOT DETECTED NOT DETECTED Final   Candida parapsilosis NOT DETECTED NOT DETECTED Final   Candida tropicalis NOT DETECTED NOT DETECTED Final   Cryptococcus neoformans/gattii NOT DETECTED NOT DETECTED Final   Vancomycin resistance NOT DETECTED NOT DETECTED Final    Comment: Performed at [facility], [address] [place], [place] [address]  Respiratory Panel by RT PCR (Flu A&B, Covid) - Nasopharyngeal Swab     Status: Abnormal   Collection Time: 06/22/20 11:43 AM   Specimen: Nasopharyngeal Swab  Result Value Ref Range Status   SARS Coronavirus 2 by RT PCR POSITIVE (A) NEGATIVE Final    Comment: RESULT CALLED TO, READ BACK BY AND VERIFIED WITH:  COBLE SAM,RN  ON 06/22/2020, [email or web address] (NOTE) SARS-CoV-2 target nucleic acids are DETECTED.  SARS-CoV-2 RNA is generally detectable in upper respiratory specimens  during the acute phase of infection. Positive results are indicative of the presence of the identified virus, but do not rule out bacterial  infection or co-infection with other pathogens not detected by the test. Clinical correlation with patient history and other diagnostic information is necessary to determine patient infection status. The expected result is Negative.  Fact Sheet for Patients:  https://www.moore.com/  Fact Sheet for Healthcare Providers: https://www.young.biz/  This test is not yet approved or cleared by the [place] FDA and  has been authorized for detection and/or diagnosis of SARS-CoV-2 by FDA under an Emergency Use Authorization (EUA).  This EUA will remain in effect (meaning this  test can be used) for the duration of  the COVID-19 declaration under Section 564(b)(1) of the Act, 21 U.S.C. section 360bbb-3(b)(1), unless the authorization is terminated or revoked sooner.       Influenza A by PCR NEGATIVE NEGATIVE Final   Influenza B by PCR NEGATIVE NEGATIVE Final    Comment: (NOTE) The Xpert Xpress SARS-CoV-2/FLU/RSV assay is intended as an aid in  the diagnosis of influenza from Nasopharyngeal swab specimens and  should not be used as a sole basis for treatment. Nasal washings and  aspirates are unacceptable for Xpert Xpress SARS-CoV-2/FLU/RSV  testing.  Fact Sheet for Patients: https://www.moore.com/  Fact Sheet for Healthcare Providers: https://www.young.biz/  This test is not yet approved or cleared by the [place] FDA and  has been authorized for detection and/or diagnosis of SARS-CoV-2 by  FDA under an Emergency Use Authorization (EUA). This EUA will remain  in effect (meaning this test can be used) for the duration of the  Covid-19 declaration under Section 564(b)(1) of the Act, 21  U.S.C. section 360bbb-3(b)(1), unless the authorization is  terminated or revoked. Performed at [facility], [address] [place], [place] [address]   Blood Culture (routine x 2)     Status: Abnormal   Collection Time: 06/22/20 11:58 AM   Specimen: Left Antecubital; Blood  Result Value Ref Range Status   Specimen Description   Final    LEFT ANTECUBITAL Performed at [facility], [address] [place], [place] [address]    Special Requests   Final    BOTTLES DRAWN AEROBIC AND ANAEROBIC Blood Culture adequate volume Performed at [facility], [address] [place], [place] [address]    Culture  Setup Time   Final    IN BOTH AEROBIC AND ANAEROBIC BOTTLES GRAM POSITIVE COCCI CRITICAL VALUE NOTED.  VALUE IS CONSISTENT WITH PREVIOUSLY REPORTED AND CALLED VALUE.    Culture (A)  Final    ENTEROCOCCUS FAECALIS SUSCEPTIBILITIES PERFORMED ON PREVIOUS CULTURE WITHIN THE LAST 5 DAYS. Performed at [facility], [address], [place], [place] [address]    Report Status 06/25/2020  FINAL  Final  MRSA PCR Screening     Status: None   Collection Time: 06/23/20  8:08 AM   Specimen: Nasal Mucosa; Nasopharyngeal  Result Value Ref Range Status   MRSA by PCR NEGATIVE NEGATIVE Final    Comment:        The GeneXpert MRSA Assay (FDA approved for NASAL specimens only), is one component of a comprehensive MRSA colonization surveillance program. It is not intended to diagnose MRSA infection nor to guide or monitor treatment for MRSA infections. Performed at [facility], [address], [place], [place] [address]   Urine culture     Status: None   Collection Time: 06/23/20 12:01 PM   Specimen: Urine, Clean Catch  Result Value Ref Range Status   Specimen Description   Final  URINE, CLEAN CATCH Performed at [facility], [address] [place], [place] [address]    Special Requests   Final    NONE Performed at [facility], [address] [place], [place] [address]    Culture   Final    NO GROWTH Performed at [facility], [address] [place], [place] [address]    Report Status 06/24/2020 FINAL  Final  Culture, blood (routine x 2)     Status: None   Collection Time: 06/24/20  6:50 AM   Specimen: BLOOD LEFT HAND  Result Value Ref Range Status   Specimen Description BLOOD LEFT HAND  Final   Special Requests   Final    BOTTLES DRAWN AEROBIC AND ANAEROBIC Blood Culture adequate volume   Culture   Final    NO GROWTH 5 DAYS Performed at [facility], [address] [place], [place] [address]    Report Status 06/29/2020 FINAL  Final  Culture, blood (routine x 2)     Status: None   Collection Time: 06/24/20  6:50 AM   Specimen: BLOOD RIGHT HAND  Result Value Ref Range Status   Specimen Description BLOOD RIGHT HAND  Final   Special Requests   Final    BOTTLES DRAWN AEROBIC AND ANAEROBIC Blood Culture adequate volume   Culture   Final    NO GROWTH 5 DAYS Performed at [facility], [address]  [place], [place] [address]    Report Status 06/29/2020 FINAL  Final  Culture, respiratory (non-expectorated)     Status: None   Collection Time: 06/29/20 11:37 AM   Specimen: Tracheal Aspirate; Respiratory  Result Value Ref Range Status   Specimen Description TRACHEAL ASPIRATE  Final   Special Requests NONE  Final   Gram Stain   Final    FEW WBC PRESENT,BOTH PMN AND MONONUCLEAR RARE BUDDING YEAST SEEN Performed at [facility], [address], [place], [place] [address]    Culture FEW CANDIDA ALBICANS  Final   Report Status 07/01/2020 FINAL  Final    Pertinent Lab. CBC Latest Ref Rng & Units 07/04/2020 07/03/2020 07/03/2020  WBC 4.0 - 10.5 K/uL 8.7 - -  Hemoglobin 13.0 - 17.0 g/dL 8.3(L) 8.8(L) 8.2(L)  Hematocrit 39 - 52 % 26.3(L) 28.5(L) 25.9(L)  Platelets 150 - 400 K/uL 146(L) - -   CMP Latest Ref Rng & Units 07/04/2020 07/03/2020 07/02/2020  Glucose 70 - 99 mg/dL 035(K) 093(G) 182(X)  BUN 8 - 23 mg/dL 16 14 12   Creatinine 0.61 - 1.24 mg/dL 9.37 1.69  Sodium 135 - 145 mmol/L 141 143 141  Potassium 3.5 - 5.1 mmol/L 4.7 4.5 4.3  Chloride 98 - 111 mmol/L 108 111 109  CO2 22 - 32 mmol/L 25 26 26   Calcium 8.9 - 10.3 mg/dL 6.78) 8.9 )  Total Protein 6.5 - 8.1 g/dL - - -  Total Bilirubin 0.3 - 1.2 mg/dL - - -  Alkaline Phos 38 - 126 U/L - - -  AST 15 - 41 U/L - - -  ALT 0 - 44 U/L - - -    Pertinent Imaging today Plain films and CT images have been personally visualized and interpreted; radiology reports have been reviewed. Decision making incorporated into the Impression / Recommendations.  I have spent approx 30 minutes for this patient encounter including review of prior medical records with greater than 50% of time being face to face  and coordination of their care.

## 2020-07-04 NOTE — Progress Notes (Signed)
   07/04/20 0831  Airway 8 mm  Placement Date/Time: 06/29/20 0300   Difficult airway due to:: Difficulty was anticipated;Difficult airway - due to limited oral opening  Placed By: Self  Airway Device: Endotracheal Tube  Laryngoscope Blade: MAC;4  ETT Types: Oral;Subglottic  Size (m...  Secured at (cm) 25 cm  Measured From Lips  Secured Location Right  Secured By Actuary Repositioned Yes  Prone position No  Cuff Pressure (cm H2O) 20 cm H2O  Site Condition Dry  Adult Ventilator Settings  Vent Type Servo i  Humidity HME  Vent Mode PRVC  Vt Set 450 mL  Set Rate 22 bmp  FiO2 (%) 40 %  I Time 0.9 Sec(s)  PEEP 5 cmH20  Adult Ventilator Measurements  Peak Airway Pressure 17 L/min  Mean Airway Pressure 9 cmH20  Plateau Pressure 147 cmH20  Resp Rate Spontaneous 0 br/min  Resp Rate Total 22 br/min  Exhaled Vt 464 mL  Measured Ve 9.5 mL  I:E Ratio Measured 1:2  Auto PEEP 1 cmH20  Total PEEP 6 cmH20  SpO2 99 %  Adult Ventilator Alarms  Alarms On Y  Ve High Alarm 18 L/min  Ve Low Alarm 4 L/min  Resp Rate High Alarm 40 br/min  Resp Rate Low Alarm 8  PEEP Low Alarm 3 cmH2O  Press High Alarm 45 cmH2O  VAP Prevention  Cuff pressure (initial) 20 cm H2O  Cuff pressure after change 20 cm H2O  HME changed No  Ventilator changed No  Equipment wiped down Yes  Daily Weaning Assessment  Daily Assessment of Readiness to Wean Wean protocol criteria not met  Reason not met Apnea  Breath Sounds  Bilateral Breath Sounds Diminished  Airway Suctioning/Secretions  Suction Type ETT  Suction Device  Catheter  Secretion Amount Copious  Secretion Color Tan  Secretion Consistency Thick  Suction Tolerance Tolerated well  Suctioning Adverse Effects None

## 2020-07-04 NOTE — Progress Notes (Signed)
NAME:  Gregory Wheeler, MRN:  [id number], DOB:  [birth year], LOS: 12 ADMISSION DATE:  06/22/2020, CONSULTATION DATE:  06/29/20 REFERRING MD:  Thedore MinsSingh  CHIEF COMPLAINT:  Respiratory Arrest   Brief History   Gregory Wheeler is a 79 y.o. male who was admitted 10/27 with COVID PNA and possible aspiration PNA.  Started on remdesivir, decadron, unasyn.  Also started on CIWA protocol for DT's.   Early AM 11/3, had respiratory arrest and was subsequently intubated and transferred to the ICU.  History of present illness   Pt is encephalopathic; therefore, this HPI is obtained from chart review.  Gregory Wheeler is a 79 y.o. male who has a PMH as outlined below.  He was admitted on 10/27 with COVID PNA after testing positive 10 days prior per notes (unsure when initial positive test was) and possible aspiration PNA.  He was admitted by [facility] and was started on decadron and remdesivir along with CIWA protocol given his hx of heavy EtOH use and unasyn for possible aspiration PNA.  He was vaccinated with Pfizer around April 2021. Blood cultures noteably positive for enterococcus.  Seen by ID who recommends amp + ceftriaxone x 6 weeks.  11/2 he was evaluated by GI for GIB.  Lovenox was stopped and protonix gtt started.  CTA was recommended if bleeding persisted. 11/2 was also evaluated by ENT for oral denture mucositis.  He was started on diflucan and unasyn added back.  During early AM hours 11/3, he was noted to have worsening hypoxia which progressed into agonal respirations.  SBP dropped to 60's; however, he never lost pulses.  He was bagged by RRT and was administered narcan after receiving morphine earlier in the night.  He had no response to narcan.  Code Blue was called for respiratory arrest.  He was intubated by RT and was transferred to 58M.  Upon arrival, multiple dried clots were suctioned out of his mouth.  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  Significant Hospital Events   10/27 > admit. 11/2 > GI consult for GIB, ENT consult for mucositis. 11/3 > respiratory arrest, intubated and transferred to ICU.  Consults:  GI, ENT, PCCM.  Procedures:  PICC 11/1 >  ETT 11/3 >   Significant Diagnostic Tests:    Micro Data:  Flu 10/27 > neg. COVID 10/27 > pos. Blood 10/27 > enterococcus. Urine 10/28 > neg. Blood 10/29 >   Antimicrobials:  Azithro 10/27 > 10/27 Ceftriaxone 10/27 >  Ampicillin 10/28 >  11/1 >   Interim history/subjective:  Vent requirrments remain low. Flexiseal overnight for watery stools.  Objective:  Blood pressure (!) 152/75, pulse 72, temperature 98.8 F (37.1 C), temperature source Axillary, resp. rate (!) 24, height 5\' 11"  (1.803 m), weight 112.8 kg, SpO2 100 %.    Vent Mode: PRVC FiO2 (%):  [40 %] 40 % Set Rate:  [22 bmp] 22  bmp Vt Set:  [450 mL] 450 mL PEEP:  [5 cmH20] 5 cmH20 Pressure Support:  [10 cmH20] 10 cmH20 Plateau Pressure:  [15 cmH20-147 cmH20] 147 cmH20   Intake/Output Summary (Last 24 hours) at 07/04/2020 1133 Last data filed at 07/04/2020 0900 Gross per 24 hour  Intake 4540.01 ml  Output 1720 ml  Net 2820.01 ml   Filed Weights   07/02/20 0346 07/03/20 0334 07/04/20 0500    Weight: 109.4 kg 112.5 kg 112.8 kg    Examination: General: Chronically ill-appearing elderly man intubated, lightly sedated on mechanical ventilation HEENT: [place]/AT, eyes anicteric, PERRL, endotracheal tube in place. Neuro: PERRL, withdraws from pain. RASS -4 CV: S1-S2, regular rate and rhythm PULM: Clear all station bilaterally, breathing synchronously with the ventilator. Pplat 13 GI: Soft, nontender, nondistended Extremities: Bilateral lower extremity edema, no clubbing or cyanosis Skin: no rashes or lesions GU: Foley in place   Resolved problems:  Transient hypotension  Assessment & Plan:  Acute hypoxemic respiratory failure in setting of worsening delirium and likely aspiration event.  -Continue ventilator support with lung protective strategies - tolerating PSV. Meeting pressure goals. -Continue daily SAT & SBT trials-- apneic today on SBT. -Sedation is required to tolerate mechanical ventilation. Precedex PRN. -VAP bundle in place  -Titrate down FiO2 as able to maintain SPO2 greater than 90%.  Enterococcal bacteremia 10/27 -Repeat cultures negative 10/29 -No TEE, re-consider once out of infectious window for COVID. Blood cultures were not persistently positive and no valvular abnormality seen on transthoracic echocardiogram. Per ID, would need 6 weeks of antibiotics or TEE. P: -Ceftriaxone daily per ID recommendations. We will continue for 6 weeks or until TEE  Acute metabolic encephalopathy with hx of heavy alcohol abuse and question of DTs vs. ICU-associated delirium P: - PRN Precedex  UGIB - minimal suction trauma on EGD - steady decline in Hgb, received 1u pRBC overnight 11/3 to 11/4 for Hgb <7. Had evidence of red blood in OG tube AM 11/3.  P: GI ([facility]) consulted; will be available as needed  Underwent upper EGD 11/4 with probable caustic gastritis from the NG tube. May resume tube feeds Continue BID PPI   Breakthrough COVID 19 infection - s/p remdesivir and  steroids. Not contributing much to current picture. P: -Supportive care   Hx CAD, RBBB, Mobitz 1 AVB, Afib - Baseline anticoagulated with PO Xarelto   P: -Continue to monitor on telemetry -Resuming Lovenox today. Continue to monitor for further bleeding.  Best Practice:  Diet: tube feeds Pain/Anxiety/Delirium protocol (if indicated): see above VAP protocol (if indicated): ordered DVT prophylaxis: SCDs GI prophylaxis: PPI Glucose control: SSI Mobility: BR Code Status: Full Family Communication: son Christen Bame updated 11/8 Disposition: ICU   Labs   CBC: Recent Labs  Lab 06/28/20 0820 06/28/20 0820 06/28/20 1125 06/28/20 1125 06/28/20 2020 06/28/20 2020 06/29/20 0126 06/29/20 0352 06/30/20 0618 06/30/20 0830 07/01/20 0237 07/01/20 1241 07/02/20 0344 07/02/20 1704 07/02/20 2107 07/03/20 0339 07/03/20 1500 07/03/20 2042 07/04/20 0513  WBC 12.8*   < > 10.7*   < > 11.5*   < > 18.6*   < > 8.0  --  8.7  --  9.0  --   --  8.9  --   --  8.7  NEUTROABS 10.8*  --  9.0*  --  9.7*  --  14.8*  --   --   --   --   --   --   --   --   --   --   --  6.9  HGB 9.8*   < > 9.3*   < > 8.5*   < > 9.6*   < > 7.4*   < > 8.6*   < > 8.1*   < > 7.9* 8.0* 8.2* 8.8* 8.3*  HCT 31.0*   < > 29.1*   < > 26.3*   < > 30.9*   < > 22.4*   < > 26.9*   < > 26.3*   < > 25.2* 26.0* 25.9* 28.5* 26.3*  MCV 106.2*   < > 106.2*   < > 108.2*   < > 112.0*   < > 102.8*  --  105.5*  --  106.9*  --   --  108.3*  --   --  108.2*  PLT 253   < > 221   < > 222   < > 260   < > 144*  --  143*  --  130*  --   --  125*  --   --  146*   < > = values in this interval not displayed.   Basic Metabolic Panel: Recent Labs  Lab 06/29/20 0645 06/29/20 0645 06/30/20 0618 07/01/20 0237 07/02/20 0344 07/03/20 0339 07/04/20 0513  NA 144   < > 142 142 141 143 141  K 4.9   < > 3.2* 4.1 4.3 4.5 4.7  CL 109   < > 109 112* 109 111 108  CO2 26   < > 23 24 26 26 25   GLUCOSE 125*   < > 111* 112* 128* 131* 151*  BUN 16   < > 17 13 12  14 16   CREATININE 1.29*   < > 1.09 0.98 0.96 0.99 0.81  CALCIUM 8.8*   < > 8.7* 8.7* 8.4* 8.9 8.5*  MG 1.8   < > 1.5* 1.4* 1.5* 1.8 1.3*  PHOS 3.2  --  2.4* 3.8 3.3 3.1  --    < > = values in this interval not displayed.   GFR: Estimated Creatinine Clearance: 96 mL/min (by C-G formula based on SCr of 0.81 mg/dL). Recent Labs  Lab 06/28/20 0820 06/28/20 1125 06/29/20 0126 06/29/20 0645 06/30/20 0618 06/30/20 0618 07/01/20 0237 07/02/20 0344 07/03/20 0339 07/04/20 0513  PROCALCITON <0.10  --  <0.10  --  0.17  --  <0.10  --   --   --   WBC 12.8*   < > 18.6*   < > 8.0   < > 8.7 9.0 8.9 8.7   < > = values in this interval not displayed.   Liver Function Tests: Recent Labs  Lab 06/28/20 0820 06/29/20 0126 06/30/20 0618 07/01/20 0237  AST 57* 36 23 32  ALT 75* 66* 34 42  ALKPHOS 53 55 33* 46  BILITOT 0.8 0.5 1.0 0.5  PROT 5.1* 5.1* 4.3* 4.4*  ALBUMIN 2.3* 2.3* 2.5* 2.2*   No results for input(s): LIPASE, AMYLASE in the last 168 hours. No results for input(s): AMMONIA in the last 168 hours. ABG    Component Value Date/Time   PHART 7.520 (H) 06/29/2020 0633   PCO2ART 26.6 (L) 06/29/2020 0633   PO2ART 60 (L) 06/29/2020 0633   HCO3 25.4 06/30/2020 0900   TCO2 23 06/29/2020 0633   ACIDBASEDEF 1.0 06/29/2020 0633   O2SAT 84.0 06/30/2020 0900    Coagulation Profile: No results for input(s): INR, PROTIME in the last 168 hours. Cardiac Enzymes: No results for input(s): CKTOTAL, CKMB, CKMBINDEX, TROPONINI in the last 168 hours. HbA1C: Hgb  A1c MFr Bld  Date/Time Value Ref Range Status  06/29/2020 06:45 AM 5.9 (H) 4.8 - 5.6 % Final    Comment:    (NOTE) Pre diabetes:          5.7%-6.4%  Diabetes:              >6.4%  Glycemic control for   <7.0% adults with diabetes    CBG: Recent Labs  Lab 07/03/20 1746 07/03/20 2039 07/04/20 0112 07/04/20 0456 07/04/20 0746  GLUCAP 142* 126* 85 134* 112*     This patient is critically ill with multiple organ system  failure which requires frequent high complexity decision making, assessment, support, evaluation, and titration of therapies. This was completed through the application of advanced monitoring technologies and extensive interpretation of multiple databases. During this encounter critical care time was devoted to patient care services described in this note for 43 minutes.  Steffanie Dunn, DO 07/04/20 11:56 AM [facility]

## 2020-07-04 NOTE — Progress Notes (Signed)
OT Cancellation Note  Patient Details Name: Gregory Wheeler MRN: [id number] DOB: 1940/09/10   Cancelled Treatment:    Reason Eval/Treat Not Completed: Other (comment) (Just finished with PT.  PT reports decreased pt engagement. )  Rebecca Motta M Clements Toro Jerline Linzy MSOT, OTR/L Acute Rehab Pager: [phone] Office: [phone] 07/04/2020, 1:06 PM

## 2020-07-04 NOTE — Progress Notes (Signed)
Versed gtt wasted 225 mls witness by Dahlia Client, RN and Julius Bowels, RN

## 2020-07-05 ENCOUNTER — Inpatient Hospital Stay (HOSPITAL_COMMUNITY): Payer: Medicare Other

## 2020-07-05 DIAGNOSIS — J9601 Acute respiratory failure with hypoxia: Secondary | ICD-10-CM

## 2020-07-05 LAB — BASIC METABOLIC PANEL
Anion gap: 9 (ref 5–15)
BUN: 12 mg/dL (ref 8–23)
CO2: 28 mmol/L (ref 22–32)
Calcium: 8.8 mg/dL — ABNORMAL LOW (ref 8.9–10.3)
Chloride: 105 mmol/L (ref 98–111)
Creatinine, Ser: 0.82 mg/dL (ref 0.61–1.24)
GFR, Estimated: >60 mL/min (ref >60)
Glucose, Bld: 139 mg/dL — ABNORMAL HIGH (ref 70–99)
Potassium: 3.8 mmol/L (ref 3.5–5.1)
Sodium: 142 mmol/L (ref 135–145)

## 2020-07-05 LAB — CBC
HCT: 24.6 % — ABNORMAL LOW (ref 39.0–52.0)
Hemoglobin: 7.9 g/dL — ABNORMAL LOW (ref 13.0–17.0)
MCH: 34.5 pg — ABNORMAL HIGH (ref 26.0–34.0)
MCHC: 32.1 g/dL (ref 30.0–36.0)
MCV: 107.4 fL — ABNORMAL HIGH (ref 80.0–100.0)
Platelets: 135 10*3/uL — ABNORMAL LOW (ref 150–400)
RBC: 2.29 MIL/uL — ABNORMAL LOW (ref 4.22–5.81)
RDW: 15.3 % (ref 11.5–15.5)
WBC: 9.4 10*3/uL (ref 4.0–10.5)
nRBC: 0 % (ref 0.0–0.2)

## 2020-07-05 LAB — GLUCOSE, CAPILLARY
Glucose-Capillary: 108 mg/dL — ABNORMAL HIGH (ref 70–99)
Glucose-Capillary: 126 mg/dL — ABNORMAL HIGH (ref 70–99)
Glucose-Capillary: 126 mg/dL — ABNORMAL HIGH (ref 70–99)
Glucose-Capillary: 127 mg/dL — ABNORMAL HIGH (ref 70–99)
Glucose-Capillary: 143 mg/dL — ABNORMAL HIGH (ref 70–99)
Glucose-Capillary: 154 mg/dL — ABNORMAL HIGH (ref 70–99)
Glucose-Capillary: 158 mg/dL — ABNORMAL HIGH (ref 70–99)

## 2020-07-05 LAB — TRIGLYCERIDES: Triglycerides: 100 mg/dL (ref <150)

## 2020-07-05 LAB — MAGNESIUM: Magnesium: 1.6 mg/dL — ABNORMAL LOW (ref 1.7–2.4)

## 2020-07-05 MED ORDER — VITAL 1.5 CAL PO LIQD
1000.0000 mL | ORAL | Status: DC
  Administered 2020-07-05 – 2020-07-07 (×3): 1000 mL

## 2020-07-05 MED ORDER — MAGNESIUM SULFATE 4 GM/100ML IV SOLN
4.0000 g | Freq: Once | INTRAVENOUS | Status: AC
  Administered 2020-07-05: 4 g via INTRAVENOUS
  Filled 2020-07-05: qty 100

## 2020-07-05 MED ORDER — POTASSIUM CHLORIDE 20 MEQ PO PACK
40.0000 meq | PACK | Freq: Once | ORAL | Status: AC
  Administered 2020-07-05: 40 meq
  Filled 2020-07-05: qty 2

## 2020-07-05 MED ORDER — PROSOURCE TF PO LIQD
45.0000 mL | Freq: Three times a day (TID) | ORAL | Status: DC
  Administered 2020-07-05 – 2020-07-06 (×4): 45 mL
  Filled 2020-07-05 (×3): qty 45

## 2020-07-05 MED ORDER — POLYETHYLENE GLYCOL 3350 17 G PO PACK: 17.0000 g | PACK | Freq: Every day | ORAL | Status: DC | PRN

## 2020-07-05 MED ORDER — FUROSEMIDE 10 MG/ML IJ SOLN
40.0000 mg | Freq: Four times a day (QID) | INTRAMUSCULAR | Status: AC
  Administered 2020-07-05 (×2): 40 mg via INTRAVENOUS
  Filled 2020-07-05 (×2): qty 4

## 2020-07-05 NOTE — Progress Notes (Signed)
NAME:  Gregory Wheeler, MRN:  [id number], DOB:  28-Oct-1940, LOS: 13 ADMISSION DATE:  06/22/2020, CONSULTATION DATE:  06/29/20 REFERRING MD:  Thedore Mins  CHIEF COMPLAINT:  Respiratory Arrest   Brief History   Gregory Wheeler is a 79 y.o. male who was admitted 10/27 with COVID PNA and possible aspiration PNA.  Started on remdesivir, decadron, unasyn.  Also started on CIWA protocol for DT's.   Early AM 11/3, had respiratory arrest and was subsequently intubated and transferred to the ICU.  History of present illness   Pt is encephalopathic; therefore, this HPI is obtained from chart review.  Gregory Wheeler is a 79 y.o. male who has a PMH as outlined below.  He was admitted on 10/27 with COVID PNA after testing positive 10 days prior per notes (unsure when initial positive test was) and possible aspiration PNA.  He was admitted by [facility] and was started on decadron and remdesivir along with CIWA protocol given his hx of heavy EtOH use and unasyn for possible aspiration PNA.  He was vaccinated with Pfizer around April 2021. Blood cultures noteably positive for enterococcus.  Seen by ID who recommends amp + ceftriaxone x 6 weeks.  11/2 he was evaluated by GI for GIB.  Lovenox was stopped and protonix gtt started.  CTA was recommended if bleeding persisted. 11/2 was also evaluated by ENT for oral denture mucositis.  He was started on diflucan and unasyn added back.  During early AM hours 11/3, he was noted to have worsening hypoxia which progressed into agonal respirations.  SBP dropped to 60's; however, he never lost pulses.  He was bagged by RRT and was administered narcan after receiving morphine earlier in the night.  He had no response to narcan.  Code Blue was called for respiratory arrest.  He was intubated by RT and was transferred to 55M.  Upon arrival, multiple dried clots were suctioned out of his mouth.  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  Significant Hospital Events   10/27 > admit. 11/2 > GI consult for GIB, ENT consult for mucositis. 11/3 > respiratory arrest, intubated and transferred to ICU.  Consults:  GI, ENT, PCCM.  Procedures:  PICC 11/1 >  ETT 11/3 >   Significant Diagnostic Tests:    Micro Data:  Flu 10/27 > neg. COVID 10/27 > pos. Blood 10/27 > enterococcus. Urine 10/28 > neg. Blood 10/29 >   Antimicrobials:  Azithro 10/27 > 10/27 Ceftriaxone 10/27 >  Ampicillin 10/28 >  11/1 >   Interim history/subjective:  Vent requirements are minimal.  Currently sedation yesterday afternoon due to agitation.  More arousable today.  Objective:  Blood pressure 135/81, pulse 74, temperature 98.5 F (36.9 C), temperature source Oral, resp. rate (!) 22, height 5\' 11"  (1.803 m), weight 108.2 kg, SpO2 99 %.    Vent Mode: CPAP;PSV FiO2 (%):  [40 %] 40 %  Set Rate:  [22 bmp] 22 bmp Vt Set:  [450 mL] 450 mL PEEP:  [5 cmH20] 5 cmH20 Pressure Support:  [5 cmH20] 5 cmH20 Plateau Pressure:  [14 cmH20-147 cmH20] 14 cmH20   Intake/Output Summary (Last 24 hours) at 07/05/2020 0823 Last data filed at 07/05/2020 0700 Gross per 24 hour  Intake 2958.1 ml  Output 2075 ml  Net 883.1 ml   Filed Weights   07/03/20  0334 07/04/20 0500 07/05/20 0443  Weight: 112.5 kg 112.8 kg 108.2 kg    Examination: General: Chronically ill-appearing elderly man intubated, sedated.   HEENT: [place]/AT, eyes anicteric, ETT in place. Neuro: RASS -4, withdraws CV: S1-S2, regular rate and rhythm. PULM: CTAB, breathing synchronously with the vent on pressure support 8 + CPAP 5 GI: Soft, nontender, nondistended Extremities: Persistent lower extremity edema, improved from previous exam.  No clubbing or cyanosis. Skin: No rashes or wounds GU: Foley in place  CXR personally reviewed-patchy airspace disease, left lower lobe opacity.    Resolved problems:  Transient hypotension  Assessment & Plan:  Acute hypoxemic respiratory failure in setting of worsening delirium and likely aspiration event.  -Continue low tidal volume ventilation, 4 to 8 cc/kg ideal body weight with goal plateau less than 30 and driving pressure less than 15.  Vent pressures remain low.  Titrate PEEP and FiO2 per ARDS protocol. -Continue daily SAT & SBT trials--tolerating appropriate pressure support trial, but not following commands. -Continue Precedex as needed.  Hopeful for extubation today. -VAP bundle in place  -Titrate down FiO2 as able to maintain SPO2 greater than 90%. -lasix x 2 today  Enterococcal bacteremia 10/27 -Repeat cultures negative 10/29 -No TEE, re-consider once out of infectious window for COVID. Blood cultures were not persistently positive and no valvular abnormality seen on transthoracic echocardiogram. Per ID, would need 6 weeks of antibiotics or TEE. P: -Ceftriaxone daily per ID recommendations. We will continue for 6 weeks or until TEE  Acute metabolic encephalopathy with hx of heavy alcohol abuse and question of DTs vs. ICU-associated delirium P: - PRN Precedex  UGIB - minimal suction trauma on EGD - steady decline in Hgb, received 1u pRBC overnight 11/3 to 11/4 for Hgb <7. Had evidence of red blood in OG tube AM 11/3.   P: -GI ([facility]) consulted; will be available as needed  Underwent upper EGD 11/4 with probable causative gastritis from the NG tube. -Continue tube feeds.  Lovenox restarted 11/8 -Continue BID PPI   Breakthrough COVID 19 infection - s/p remdesivir and steroids. Not contributing much to current picture. P: -Supportive care   Hx CAD, RBBB, Mobitz 1 AVB, paroxysmal Afib.  Currently in sinus rhythm. - Baseline anticoagulated with PO Xarelto   P: -Continue to monitor on telemetry -Continue to monitor for bleeding and serial H&H  Best Practice:  Diet: tube feeds Pain/Anxiety/Delirium protocol (if indicated): see above VAP protocol (if indicated): ordered DVT prophylaxis: SCDs GI prophylaxis: PPI Glucose control: SSI Mobility: BR Code Status: Full Family Communication: son Christen Bame  Disposition: ICU   Labs   CBC: Recent Labs  Lab 06/28/20 1125 06/28/20 1125 06/28/20 2020 06/28/20 2020 06/29/20 0126 06/29/20 0352 07/01/20 0237 07/01/20 1241 07/02/20 0344 07/02/20 1704 07/03/20 0339 07/03/20 1500 07/03/20 2042 07/04/20 0513 07/05/20 0500  WBC 10.7*   < > 11.5*   < > 18.6*   < > 8.7  --  9.0  --  8.9  --   --  8.7 9.4  NEUTROABS 9.0*  --  9.7*  --  14.8*  --   --   --   --   --   --   --   --  6.9  --   HGB 9.3*   < > 8.5*   < > 9.6*   < > 8.6*   < > 8.1*   < > 8.0* 8.2* 8.8* 8.3* 7.9*  HCT 29.1*   < > 26.3*   < > 30.9*   < > 26.9*   < > 26.3*   < > 26.0* 25.9* 28.5* 26.3* 24.6*  MCV 106.2*   < > 108.2*   < > 112.0*   < > 105.5*  --  106.9*  --  108.3*  --   --  108.2* 107.4*  PLT 221   < > 222   < > 260   < > 143*  --  130*  --  125*  --   --  146* 135*   < > = values in this interval not displayed.   Basic Metabolic Panel: Recent Labs  Lab 06/29/20 0645 06/29/20 0645 06/30/20 0618 06/30/20 0618 07/01/20 0237 07/02/20 0344 07/03/20 0339 07/04/20 0513 07/05/20 0500  NA 144   < > 142   < > 142 141 143 141 142  K 4.9   < > 3.2*   < > 4.1 4.3 4.5 4.7 3.8  CL  109   < > 109   < > 112* 109 111 108 105  CO2 26   < > 23   < > 24 26 26 25 28   GLUCOSE 125*   < > 111*   < > 112* 128* 131* 151* 139*  BUN 16   < > 17   < > 13 12 14 16 12   CREATININE 1.29*   < > 1.09   < > 0.98 0.96 0.99 0.81 0.82  CALCIUM 8.8*   < > 8.7*   < > 8.7* 8.4* 8.9 8.5* 8.8*  MG 1.8   < > 1.5*   < > 1.4* 1.5* 1.8 1.3* 1.6*  PHOS 3.2  --  2.4*  --  3.8 3.3 3.1  --   --    < > = values in this interval not displayed.   GFR: Estimated Creatinine Clearance: 92.9 mL/min (by C-G formula based on SCr of 0.82 mg/dL). Recent Labs  Lab 06/29/20 0126 06/29/20 0645 06/30/20 0618 06/30/20 0618 07/01/20 0237 07/01/20 0237 07/02/20 0344 07/03/20 0339 07/04/20 0513 07/05/20 0500  PROCALCITON <0.10  --  0.17  --  <0.10  --   --   --   --   --   WBC 18.6*   < > 8.0   < > 8.7   < > 9.0 8.9 8.7 9.4   < > = values in this interval not displayed.   Liver Function Tests: Recent Labs  Lab 06/29/20 0126 06/30/20 0618 07/01/20 0237  AST 36 23 32  ALT 66* 34 42  ALKPHOS 55 33* 46  BILITOT 0.5 1.0 0.5  PROT 5.1* 4.3* 4.4*  ALBUMIN 2.3* 2.5* 2.2*   No results for input(s): LIPASE, AMYLASE in the last 168 hours. No results for input(s): AMMONIA in the last 168 hours. ABG    Component Value Date/Time   PHART 7.520 (H) 06/29/2020 0633   PCO2ART 26.6 (L) 06/29/2020 0633   PO2ART 60 (L) 06/29/2020 0633   HCO3 25.4 06/30/2020 0900   TCO2 23 06/29/2020 0633   ACIDBASEDEF 1.0 06/29/2020 0633   O2SAT 84.0  06/30/2020 0900    Coagulation Profile: No results for input(s): INR, PROTIME in the last 168 hours. Cardiac Enzymes: No results for input(s): CKTOTAL, CKMB, CKMBINDEX, TROPONINI in the last 168 hours. HbA1C: Hgb A1c MFr Bld  Date/Time Value Ref Range Status  06/29/2020 06:45 AM 5.9 (H) 4.8 - 5.6 % Final    Comment:    (NOTE) Pre diabetes:          5.7%-6.4%  Diabetes:              >6.4%  Glycemic control for   <7.0% adults with diabetes    CBG: Recent Labs  Lab  07/04/20 1531 07/04/20 2003 07/05/20 0040 07/05/20 0324 07/05/20 0803  GLUCAP 139* 127* 158* 154* 143*     This patient is critically ill with multiple organ system failure which requires frequent high complexity decision making, assessment, support, evaluation, and titration of therapies. This was completed through the application of advanced monitoring technologies and extensive interpretation of multiple databases. During this encounter critical care time was devoted to patient care services described in this note for 33 minutes.  Steffanie Dunn, DO 07/05/20 8:34 AM [facility]

## 2020-07-05 NOTE — Care Plan (Signed)
Daughter [name] updated by phone.  Steffanie Dunn, DO 07/05/20 12:15 PM [facility]

## 2020-07-05 NOTE — Progress Notes (Signed)
Occupational Therapy Treatment Patient Details Name: Gregory Wheeler MRN: [id number] DOB: [birth year] Today's Date: 07/05/2020    History of present illness Pt is 79 yo male presenting to ED 10/27 with generalized weakness, poor food intake, and confusion. COVID positive. 11/3 pt with respiratory arrest and intubated also noted to have GIB. 11/4 EGD performed. Extubated 11/9. PMH including arthritis, A-fib, HTN, AKI, alcohol abuse, and CAD with s/p stenting of L anterior descending artery.   OT comments  Upon arrival, pt awake and supine in bed; RN at bedside. Pt happy to have a visitor and willing to participate in AAROM and grooming with increased time and cues. Max A +2 for repositioning in bed; optimize upright posture with chair position. Pt requiring Min-Mod A for grooming tasks at bed. Fatigues quickly. Spo2 97% on 4L O2; HR 115. Update dc to SNF for further rehab postacute. However, pending progress, may be able to dc to home with family and HHOT. Will continue to follow acutely as admitted.    Follow Up Recommendations  SNF;Supervision/Assistance - 24 hour (Pending progress)    Equipment Recommendations  3 in 1 bedside commode    Recommendations for Other Services PT consult    Precautions / Restrictions Precautions Precautions: Fall Precaution Comments: ETT, flexi-seal, vent, bilateral mittens, cortrak       Mobility Bed Mobility Overal bed mobility: Needs Assistance             General bed mobility comments: Max A +2 for repositioning in bed and pulling towards [facility]  Transfers                 General transfer comment: Defer for safety    Balance                                           ADL either performed or assessed with clinical judgement   ADL Overall ADL's : Needs assistance/impaired     Grooming: Oral care;Wash/dry face;Moderate assistance;Bed level;Minimal assistance Grooming Details (indicate cue type and reason): Pt  performing oral care with mouth swab on yonker with Min A for dipping into mouth wash. Mod A for washing face as pt fatigues quickly; able to bring hand to mouth but difficulty bringing to eyes                               General ADL Comments: Focused session on grooming and ROM at bed level.      Vision       Perception     Praxis      Cognition Arousal/Alertness: Awake/alert Behavior During Therapy: Flat affect (smiling at times; fatigued) Overall Cognitive Status: Impaired/Different from baseline                                 General Comments: Pt recently extubated. Fatigued but very happy for company. Requiring increased time and visual/tactile cues for particiapting in ROM. Answering 50% of questions; otherwise tangtial        Exercises Exercises: General Upper Extremity;General Lower Extremity General Exercises - Upper Extremity Shoulder Flexion: AAROM;Both;10 reps;Supine Elbow Flexion: AAROM;Both;5 reps;Supine Elbow Extension: AAROM;Both;5 reps;Supine Wrist Flexion: AAROM;Both;5 reps;Supine Wrist Extension: AAROM;Both;5 reps;Supine General Exercises - Lower Extremity Ankle Circles/Pumps: AAROM;Both;10 reps;Supine Long Arc Quad: AAROM;Both;10 reps;Supine (  bed in chair position) Hip ABduction/ADduction: AAROM;Both;10 reps;Supine (Bed in chair position)   Shoulder Instructions       General Comments SpO2 97% on 4L and 92% on RA (during grooming). HR 115. RR 20s.     Pertinent Vitals/ Pain       Pain Assessment: No/denies pain  Home Living                                          Prior Functioning/Environment              Frequency  Min 2X/week        Progress Toward Goals  OT Goals(current goals can now be found in the care plan section)  Progress towards OT goals: Progressing toward goals  Acute Rehab OT Goals Patient Stated Goal: Get to my truck and go home OT Goal Formulation: With  patient Time For Goal Achievement: 07/07/20 Potential to Achieve Goals: Good ADL Goals Pt Will Perform Grooming: with min guard assist;standing Pt Will Perform Lower Body Dressing: with min guard assist;sit to/from stand Pt Will Transfer to Toilet: with min guard assist;bedside commode;ambulating Additional ADL Goal #1: Pt will follow 75% of simple one step cues during ADLs  Plan Discharge plan needs to be updated    Co-evaluation                 AM-PAC OT "6 Clicks" Daily Activity     Outcome Measure   Help from another person eating meals?: Total Help from another person taking care of personal grooming?: A Lot Help from another person toileting, which includes using toliet, bedpan, or urinal?: A Lot Help from another person bathing (including washing, rinsing, drying)?: A Lot Help from another person to put on and taking off regular upper body clothing?: A Lot Help from another person to put on and taking off regular lower body clothing?: A Lot 6 Click Score: 11    End of Session Equipment Utilized During Treatment: Oxygen (4L)  OT Visit Diagnosis: Unsteadiness on feet (R26.81);Other abnormalities of gait and mobility (R26.89);Muscle weakness (generalized) (M62.81)   Activity Tolerance Patient limited by fatigue   Patient Left in bed;with call bell/phone within reach;with bed alarm set;with restraints reapplied   Nurse Communication Mobility status        Time: 1610-9604 OT Time Calculation (min): 36 min  Charges: OT General Charges $OT Visit: 1 Visit OT Treatments $Self Care/Home Management : 8-22 mins $Therapeutic Activity: 8-22 mins  Shastina Rua MSOT, OTR/L Acute Rehab Pager: [phone] Office: [phone]   Theodoro Grist Dontee Jaso 07/05/2020, 4:47 PM

## 2020-07-05 NOTE — Progress Notes (Signed)
Assisted tele visit to patient with son.  Mando Blatz M Jacynda Brunke, RN   

## 2020-07-05 NOTE — Progress Notes (Signed)
Nutrition Follow-up  DOCUMENTATION CODES:   Severe malnutrition in context of acute illness/injury  INTERVENTION:   Tube Feeding via Cortrak: Vital 1.5 at 60 ml/hr Pro-Source 45 mL TID Provides 2280 kcals, 130 g of protein, 1094 mL of free water   Recommend continuing TF until diet advanced and pt meeting >70% of needs by mouth   NUTRITION DIAGNOSIS:   Severe Malnutrition related to acute illness (COVID) as evidenced by moderate muscle depletion, energy intake < or equal to 50% for > or equal to 5 days.  Being addressed via TF   GOAL:   Patient will meet greater than or equal to 90% of their needs  Progressing  MONITOR:   Vent status, Skin, TF tolerance, Weight trends, Labs, I & O's  REASON FOR ASSESSMENT:   Consult Assessment of nutrition requirement/status  ASSESSMENT:   79 y.o. male with medical history significant of Spondylosis, gout, GERD, HTn, p A.fib, iron defficiency anemia, heavy alcohol abuse, CAD, HLD, right bundle branch block, history of small bowel obstruction presents with shortness of breath and weakness. Pt diagnosed with COVID-19 pneumonia along with possible aspiration pneumonia, also had evidence of severe dehydration and AKI.  10/27 Admitted 11/02 ENT evaluated for oral denture mucositis, GI consult for ?GI bleed 11/03 Resp arrest, intubated 11/08 Cortrak placed 11/09 Extubated  NPO, Cortrak tube in place. TF on hold post extubation, per RN plan to resume.   Pt has been tolerating Vital 1.5 at 55 ml/hr, Pro-Source 45 mL QID  Current wt 108.2 kg; Net + 12 L per I/O flow sheet; pt with mild edema present. Admit wt 111 kg  Labs: reviewed Meds: folic acid, lasix, MVI with Minerals, thiamine   Diet Order:   Diet Order            Diet NPO time specified Except for: Sips with Meds  Diet effective now                 EDUCATION NEEDS:   Not appropriate for education at this time  Skin:  Skin Assessment: Skin Integrity Issues: Skin  Integrity Issues:: DTI DTI: coccyx  Last BM:  11/9 rectal tube  Height:   Ht Readings from Last 1 Encounters:  06/30/20 5\' 11"  (1.803 m)    Weight:   Wt Readings from Last 1 Encounters:  07/05/20 108.2 kg    BMI:  Body mass index is 33.27 kg/m.  Estimated Nutritional Needs:   Kcal:  2000-2300 kcals  Protein:  120-135 g  Fluid:  >/= 2 L/day   13/09/21 MS, RDN, LDN, CNSC Registered Dietitian III Clinical Nutrition RD Pager and On-Call Pager Number Located in [place]

## 2020-07-05 NOTE — Procedures (Signed)
Extubation Procedure Note  Patient Details:   Name: HOMMER CUNLIFFE DOB: Sep 22, 1940 MRN: [id number]   Airway Documentation:    Vent end date: 07/05/20 Vent end time: 1100   Evaluation  O2 sats: stable throughout Complications: No apparent complications Patient did tolerate procedure well. Bilateral Breath Sounds: Diminished   Yes  Toula Moos 07/05/2020, 11:11 AM

## 2020-07-06 LAB — GLUCOSE, CAPILLARY
Glucose-Capillary: 102 mg/dL — ABNORMAL HIGH (ref 70–99)
Glucose-Capillary: 111 mg/dL — ABNORMAL HIGH (ref 70–99)
Glucose-Capillary: 114 mg/dL — ABNORMAL HIGH (ref 70–99)
Glucose-Capillary: 126 mg/dL — ABNORMAL HIGH (ref 70–99)
Glucose-Capillary: 127 mg/dL — ABNORMAL HIGH (ref 70–99)
Glucose-Capillary: 85 mg/dL (ref 70–99)

## 2020-07-06 LAB — CBC
HCT: 28.6 % — ABNORMAL LOW (ref 39.0–52.0)
Hemoglobin: 9.1 g/dL — ABNORMAL LOW (ref 13.0–17.0)
MCH: 33.7 pg (ref 26.0–34.0)
MCHC: 31.8 g/dL (ref 30.0–36.0)
MCV: 105.9 fL — ABNORMAL HIGH (ref 80.0–100.0)
Platelets: 169 10*3/uL (ref 150–400)
RBC: 2.7 MIL/uL — ABNORMAL LOW (ref 4.22–5.81)
RDW: 15.7 % — ABNORMAL HIGH (ref 11.5–15.5)
WBC: 11 10*3/uL — ABNORMAL HIGH (ref 4.0–10.5)
nRBC: 0 % (ref 0.0–0.2)

## 2020-07-06 LAB — BASIC METABOLIC PANEL
Anion gap: 11 (ref 5–15)
BUN: 12 mg/dL (ref 8–23)
CO2: 30 mmol/L (ref 22–32)
Calcium: 9.6 mg/dL (ref 8.9–10.3)
Chloride: 101 mmol/L (ref 98–111)
Creatinine, Ser: 0.87 mg/dL (ref 0.61–1.24)
GFR, Estimated: >60 mL/min (ref >60)
Glucose, Bld: 100 mg/dL — ABNORMAL HIGH (ref 70–99)
Potassium: 4.2 mmol/L (ref 3.5–5.1)
Sodium: 142 mmol/L (ref 135–145)

## 2020-07-06 LAB — MAGNESIUM: Magnesium: 1.6 mg/dL — ABNORMAL LOW (ref 1.7–2.4)

## 2020-07-06 MED ORDER — METOPROLOL TARTRATE 25 MG/10 ML ORAL SUSPENSION
12.5000 mg | Freq: Two times a day (BID) | ORAL | Status: DC
  Filled 2020-07-06: qty 5

## 2020-07-06 MED ORDER — METOPROLOL TARTRATE 25 MG/10 ML ORAL SUSPENSION
12.5000 mg | Freq: Two times a day (BID) | ORAL | Status: DC
  Administered 2020-07-06 – 2020-07-12 (×13): 12.5 mg
  Filled 2020-07-06 (×16): qty 5

## 2020-07-06 MED ORDER — MAGNESIUM SULFATE 4 GM/100ML IV SOLN
4.0000 g | Freq: Once | INTRAVENOUS | Status: AC
  Administered 2020-07-06: 4 g via INTRAVENOUS
  Filled 2020-07-06: qty 100

## 2020-07-06 NOTE — Progress Notes (Signed)
  Updated son, Christen Bame by phone.  All questions answered and plan of care discussed.    Confirming with son, patient was symptomatic and stayed out of work for 7 days prior to testing positive on 10/25.  Given he is day 16 from his initial test, would wait till 21 prior to removing restrictions given severity of his illness.     Posey Boyer, ACNP [facility] 07/06/2020, 12:48 PM  See Amion for personal pager PCCM on call pager [phone]

## 2020-07-06 NOTE — Progress Notes (Signed)
Pharmacy Electrolyte Replacement  Recent Labs:  Recent Labs    07/06/20 0500  K 4.2  MG 1.6*  CREATININE 0.87    Low Critical Values (K </= 2.5, Phos </= 1, Mg </= 1) Present: None  MD Contacted: Dr. Vassie Loll  Plan: Give magnesium sulfate IV 4 gm x1 dose. Monitor Mg with AM labs.  Sanda Klein, PharmD, RPh  PGY-1 Pharmacy Resident 07/06/2020 7:48 AM  Please check [email or web address] for unit-specific pharmacy phone numbers.

## 2020-07-06 NOTE — Progress Notes (Signed)
NAME:  Gregory Wheeler, MRN:  [id number], DOB:  March 24, 1941, LOS: 14 ADMISSION DATE:  06/22/2020, CONSULTATION DATE:  06/29/20 REFERRING MD:  Thedore Mins  CHIEF COMPLAINT:  Respiratory Arrest   Brief History   Gregory Wheeler is a 79 y.o. male who was admitted 10/27 with COVID PNA and possible aspiration PNA.  Started on remdesivir, decadron, unasyn.  Also started on CIWA protocol for DT's.   Early AM 11/3, had respiratory arrest and was subsequently intubated and transferred to the ICU.  History of present illness   Pt is encephalopathic; therefore, this HPI is obtained from chart review.  Gregory Wheeler is a 79 y.o. male who has a PMH as outlined below.  He was admitted on 10/27 with COVID PNA after testing positive 10 days prior per notes (unsure when initial positive test was) and possible aspiration PNA.  He was admitted by [facility] and was started on decadron and remdesivir along with CIWA protocol given his hx of heavy EtOH use and unasyn for possible aspiration PNA.  He was vaccinated with Pfizer around April 2021. Blood cultures noteably positive for enterococcus.  Seen by ID who recommends amp + ceftriaxone x 6 weeks.  11/2 he was evaluated by GI for GIB.  Lovenox was stopped and protonix gtt started. CTA was recommended if bleeding persisted. 11/2 was also evaluated by ENT for oral denture mucositis.  He was started on diflucan and unasyn added back.  During early AM hours 11/3, he was noted to have worsening hypoxia which progressed into agonal respirations.  SBP dropped to 60's; however, he never lost pulses.  He was bagged by RRT and was administered narcan after receiving morphine earlier in the night.  He had no response to narcan.  Code Blue was called for respiratory arrest.  He was intubated by RT and was transferred to 50M.  Upon arrival, multiple dried clots were suctioned out of his mouth.  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  Significant Hospital Events   10/27 > admit. 11/2 > GI consult for GIB, ENT consult for mucositis. 11/3 > respiratory arrest, intubated and transferred to ICU. 11/9 > extubated  11/10 > tx to PCU  Consults:  GI, ENT, PCCM.  Procedures:  Left PICC 11/3 >  ETT 11/3 > 11/9 Left nare cortrak 11/8 >>  Significant Diagnostic Tests:  10/28 TTE >> 1. Left ventricular ejection fraction, by estimation, is 60 to 65%. The  left ventricle has normal function. The left ventricle has no regional  wall motion abnormalities. There is mild left ventricular hypertrophy.  Left ventricular diastolic parameters  are consistent with Grade I diastolic dysfunction (impaired relaxation).  2. Right ventricular systolic function is moderately reduced. The right  ventricular size is normal. There is normal pulmonary artery systolic  pressure. The estimated right ventricular systolic pressure is 33.2 mmHg.  Abnormal RV, would consider PE.  3.  Right atrial size was mildly dilated.  4. The mitral valve is normal in structure. No evidence of mitral valve  regurgitation. No evidence of mitral stenosis.  5. The aortic valve is tricuspid. Aortic valve regurgitation is trivial.  Mild aortic valve sclerosis  is present, with no evidence of aortic valve  stenosis.  6. The inferior vena cava is normal in size with greater than 50%  respiratory variability, suggesting right atrial pressure of 3 mmHg.   Micro Data:  Flu 10/27 > neg. COVID 10/27 > pos. Blood 10/27 > enterococcus. Urine 10/28 > neg. Blood 10/29 > neg Trach asp 11/3 >> few candida albicans   Antimicrobials:  Azithro 10/27 > 10/27 Ceftriaxone 10/27 >  Ampicillin 10/28; 11/1 >11/2; 11/5 >>  Interim history/subjective:  No events overnight, did not sleep well  Remains on 4L [place] Ongoing secretions but patient able to clear Otherwise afebrile and hemodynamically stable Diuresed well -3.5L/ 24 hrs  Objective:  Blood pressure (!) 125/108, pulse (!) 110, temperature 99.8 F (37.7 C), resp. rate (!) 25, height 5\' 11"  (1.803 m), weight 109.5 kg, SpO2 95 %.    FiO2 (%):  [40 %] 40 %   Intake/Output Summary (Last 24 hours) at 07/06/2020 0933 Last data filed at 07/06/2020 0900 Gross per 24 hour  Intake 2308.92 ml  Output 6030 ml  Net -3721.08 ml   Filed Weights   07/04/20 0500 07/05/20 0443 07/06/20 0500  Weight: 112.8 kg 108.2 kg 109.5 kg   Examination: Per Dr. 13/10/21 No CXR 11/10  Neg 3.5L/ 24hr , remains net +10.7L, wts overall stable  Labs noted for Mag 1.6, WBC 9.4-> 11, Hgb improved 7.9-> 9.1  Resolved problems:  Transient hypotension  Assessment & Plan:  Acute hypoxemic respiratory failure in setting of worsening delirium and likely aspiration event; extubated 11/9, incidental COVID 19 - Maintain O2 saturations for goal SpO2 > 92% - Aspiration precautions - SLP eval today, continue cortrak - Ongoing aggressive pulmonary hygiene, progress activity as tolerated  - trach asp showing few candida albicans  Enterococcal bacteremia 10/27 -Repeat cultures negative 10/29 -No TEE, re-consider once out of infectious window for COVID. Blood cultures were not persistently positive and no valvular abnormality seen on  transthoracic echocardiogram. Per ID, would need 6 weeks of antibiotics or TEE. P: - Ceftriaxone and ampicillin per ID recommendations to continue therapy for total 6 weeks or until TEE  Acute metabolic encephalopathy with hx of heavy alcohol abuse and question of DTs vs. ICU-associated delirium P: - Ongoing neuro checks - delirium precautions - continue thiamine/ folate  UGIB - minimal suction trauma on EGD - steady decline in Hgb, received 1u pRBC overnight 11/3 to 11/4 for Hgb <7. Had evidence of red blood in OG tube AM 11/3.  P: - GI ([facility]) consulted; will be available as needed  Underwent upper EGD 11/4 with probable causative gastritis from the NG tube. - Continue tube feeds.  Lovenox restarted 11/8 - Continue BID PPI   Breakthrough COVID 19 infection - s/p remdesivir and steroids. Not contributing much to current picture. P: -Supportive care; airborne/ contact precautions   Hx CAD, RBBB, Mobitz 1 AVB, paroxysmal Afib.  Currently in sinus rhythm. - Baseline anticoagulated with PO Xarelto   P: -  tele monitoring -  adding back lower dose of home lopressor 12.5 mg BID with holding parameters  - trending CBC, Hgb remains stable - remains on full dose lovenox  Hypomagnesemia  P:  - s/p Mag 4 gm, recheck  in am   Best Practice:  Diet: tube feeds, pending SLP eval Pain/Anxiety/Delirium protocol (if indicated): n/a VAP protocol (if indicated): n/a DVT prophylaxis: SCDs/ lovenox full dose GI prophylaxis: PPI Glucose control: SSI Mobility: BR- progress as above Code Status: Full Family Communication: pending [facility]  Labs   CBC: Recent Labs  Lab 07/02/20 0344 07/02/20 1704 07/03/20 0339 07/03/20 0339 07/03/20 1500 07/03/20 2042 07/04/20 0513 07/05/20 0500 07/06/20 0500  WBC 9.0  --  8.9  --   --   --  8.7 9.4 11.0*  NEUTROABS  --   --   --   --   --   --  6.9  --   --   HGB 8.1*   < > 8.0*   < > 8.2* 8.8* 8.3* 7.9* 9.1*   HCT 26.3*   < > 26.0*   < > 25.9* 28.5* 26.3* 24.6* 28.6*  MCV 106.9*  --  108.3*  --   --   --  108.2* 107.4* 105.9*  PLT 130*  --  125*  --   --   --  146* 135* 169   < > = values in this interval not displayed.   Basic Metabolic Panel: Recent Labs  Lab 06/30/20 0618 06/30/20 0618 07/01/20 0237 07/01/20 0237 07/02/20 0344 07/03/20 0339 07/04/20 0513 07/05/20 0500 07/06/20 0500  NA 142   < > 142   < > 141 143 141 142 142  K 3.2*   < > 4.1   < > 4.3 4.5 4.7 3.8 4.2  CL 109   < > 112*   < > 109 111 108 105 101  CO2 23   < > 24   < > 26 26 25 28 30   GLUCOSE 111*   < > 112*   < > 128* 131* 151* 139* 100*  BUN 17   < > 13   < > 12 14 16 12 12   CREATININE 1.09   < > 0.98   < > 0.96 0.99 0.81 0.82 0.87  CALCIUM 8.7*   < > 8.7*   < > 8.4* 8.9 8.5* 8.8* 9.6  MG 1.5*   < > 1.4*   < > 1.5* 1.8 1.3* 1.6* 1.6*  PHOS 2.4*  --  3.8  --  3.3 3.1  --   --   --    < > = values in this interval not displayed.   GFR: Estimated Creatinine Clearance: 88.1 mL/min (by C-G formula based on SCr of 0.87 mg/dL). Recent Labs  Lab 06/30/20 0618 06/30/20 0618 07/01/20 0237 07/02/20 0344 07/03/20 0339 07/04/20 0513 07/05/20 0500 07/06/20 0500  PROCALCITON 0.17  --  <0.10  --   --   --   --   --   WBC 8.0   < > 8.7   < > 8.9 8.7 9.4 11.0*   < > = values in this interval not displayed.   Liver Function Tests: Recent Labs  Lab 06/30/20 0618 07/01/20 0237  AST 23 32  ALT 34 42  ALKPHOS 33* 46  BILITOT 1.0 0.5  PROT 4.3* 4.4*  ALBUMIN 2.5* 2.2*   No results for input(s): LIPASE, AMYLASE in the last 168 hours. No results for input(s): AMMONIA in the last 168 hours. ABG    Component Value Date/Time   PHART 7.520 (H) 06/29/2020 [id number]   PCO2ART 26.6 (L) 06/29/2020 0633   PO2ART 60 (L) 06/29/2020 [id number]  HCO3 25.4 06/30/2020 0900   TCO2 23 06/29/2020 0633   ACIDBASEDEF 1.0 06/29/2020 0633   O2SAT 84.0 06/30/2020 0900    Coagulation Profile: No results for input(s): INR, PROTIME in the  last 168 hours. Cardiac Enzymes: No results for input(s): CKTOTAL, CKMB, CKMBINDEX, TROPONINI in the last 168 hours. HbA1C: Hgb A1c MFr Bld  Date/Time Value Ref Range Status  06/29/2020 06:45 AM 5.9 (H) 4.8 - 5.6 % Final    Comment:    (NOTE) Pre diabetes:          5.7%-6.4%  Diabetes:              >6.4%  Glycemic control for   <7.0% adults with diabetes    CBG: Recent Labs  Lab 07/05/20 1610 07/05/20 2056 07/05/20 2333 07/06/20 0355 07/06/20 0718  GLUCAP 126* 127* 126* 85 102*     Posey Boyer, ACNP [facility] 07/06/2020, 9:33 AM  See Loretha Stapler for personal pager PCCM on call pager [phone]

## 2020-07-06 NOTE — Progress Notes (Signed)
  Speech Language Pathology Treatment:    Patient Details Name: Gregory Wheeler MRN: [id number] DOB: 1941/06/19 Today's Date: 07/06/2020 Time:  -     Pt known to ST services from [facility] 10/28 in which Dys 3, honey thick was recommended. Followed until 16-Jul-2023 when he coded and was intubated until 23-Jul-2023. An instrumental assessment will need to be repeated. Per RN he has copious secretions and is constantly oral suctioning himself with congestin. Not ready for objective swallow today; has Cotrak. ST will return tomorrow for appropriateness.                                 Royce Macadamia 07/06/2020, 12:18 PM Breck Coons Lonell Face.Ed Nurse, children's [phone] Office [phone]

## 2020-07-07 ENCOUNTER — Inpatient Hospital Stay (HOSPITAL_COMMUNITY): Payer: Medicare Other

## 2020-07-07 DIAGNOSIS — G9341 Metabolic encephalopathy: Secondary | ICD-10-CM | POA: Diagnosis not present

## 2020-07-07 DIAGNOSIS — N179 Acute kidney failure, unspecified: Secondary | ICD-10-CM | POA: Diagnosis not present

## 2020-07-07 DIAGNOSIS — J189 Pneumonia, unspecified organism: Secondary | ICD-10-CM

## 2020-07-07 DIAGNOSIS — Z7189 Other specified counseling: Secondary | ICD-10-CM

## 2020-07-07 DIAGNOSIS — G9340 Encephalopathy, unspecified: Secondary | ICD-10-CM | POA: Diagnosis not present

## 2020-07-07 DIAGNOSIS — U071 COVID-19: Secondary | ICD-10-CM | POA: Diagnosis not present

## 2020-07-07 DIAGNOSIS — J690 Pneumonitis due to inhalation of food and vomit: Secondary | ICD-10-CM

## 2020-07-07 LAB — GLUCOSE, CAPILLARY
Glucose-Capillary: 104 mg/dL — ABNORMAL HIGH (ref 70–99)
Glucose-Capillary: 104 mg/dL — ABNORMAL HIGH (ref 70–99)
Glucose-Capillary: 109 mg/dL — ABNORMAL HIGH (ref 70–99)
Glucose-Capillary: 124 mg/dL — ABNORMAL HIGH (ref 70–99)
Glucose-Capillary: 116 mg/dL — ABNORMAL HIGH (ref 70–99)
Glucose-Capillary: 112 mg/dL — ABNORMAL HIGH (ref 70–99)

## 2020-07-07 LAB — BASIC METABOLIC PANEL
Anion gap: 10 (ref 5–15)
BUN: 13 mg/dL (ref 8–23)
CO2: 28 mmol/L (ref 22–32)
Calcium: 9.6 mg/dL (ref 8.9–10.3)
Chloride: 104 mmol/L (ref 98–111)
Creatinine, Ser: 0.92 mg/dL (ref 0.61–1.24)
GFR, Estimated: >60 mL/min (ref >60)
Glucose, Bld: 100 mg/dL — ABNORMAL HIGH (ref 70–99)
Potassium: 4.4 mmol/L (ref 3.5–5.1)
Sodium: 142 mmol/L (ref 135–145)

## 2020-07-07 LAB — PHOSPHORUS: Phosphorus: 3.9 mg/dL (ref 2.5–4.6)

## 2020-07-07 LAB — URINALYSIS, ROUTINE W REFLEX MICROSCOPIC
Bilirubin Urine: NEGATIVE
Glucose, UA: NEGATIVE mg/dL
Ketones, ur: NEGATIVE mg/dL
Leukocytes,Ua: NEGATIVE
Nitrite: NEGATIVE
Protein, ur: 30 mg/dL — AB
RBC / HPF: >50 RBC/hpf — ABNORMAL HIGH (ref 0–5)
Specific Gravity, Urine: 1.017 (ref 1.005–1.030)
pH: 8 (ref 5.0–8.0)

## 2020-07-07 LAB — CBC
HCT: 26.8 % — ABNORMAL LOW (ref 39.0–52.0)
Hemoglobin: 8.5 g/dL — ABNORMAL LOW (ref 13.0–17.0)
MCH: 33.7 pg (ref 26.0–34.0)
MCHC: 31.7 g/dL (ref 30.0–36.0)
MCV: 106.3 fL — ABNORMAL HIGH (ref 80.0–100.0)
Platelets: 177 10*3/uL (ref 150–400)
RBC: 2.52 MIL/uL — ABNORMAL LOW (ref 4.22–5.81)
RDW: 15.4 % (ref 11.5–15.5)
WBC: 10.6 10*3/uL — ABNORMAL HIGH (ref 4.0–10.5)
nRBC: 0 % (ref 0.0–0.2)

## 2020-07-07 LAB — MAGNESIUM: Magnesium: 1.8 mg/dL (ref 1.7–2.4)

## 2020-07-07 LAB — BRAIN NATRIURETIC PEPTIDE: B Natriuretic Peptide: 464.9 pg/mL — ABNORMAL HIGH (ref 0.0–100.0)

## 2020-07-07 LAB — PROCALCITONIN: Procalcitonin: 0.3 ng/mL

## 2020-07-07 MED ORDER — METOPROLOL TARTRATE 5 MG/5ML IV SOLN
5.0000 mg | Freq: Three times a day (TID) | INTRAVENOUS | Status: DC | PRN
  Administered 2020-07-08: 5 mg via INTRAVENOUS
  Filled 2020-07-07: qty 5

## 2020-07-07 MED ORDER — AMLODIPINE BESYLATE 10 MG PO TABS
10.0000 mg | ORAL_TABLET | Freq: Every day | ORAL | Status: DC
  Administered 2020-07-07 – 2020-07-12 (×6): 10 mg
  Filled 2020-07-07 (×7): qty 1

## 2020-07-07 MED ORDER — NITROGLYCERIN 2 % TD OINT
1.0000 [in_us] | TOPICAL_OINTMENT | Freq: Four times a day (QID) | TRANSDERMAL | Status: DC
  Administered 2020-07-07 – 2020-07-12 (×22): 1 [in_us] via TOPICAL
  Filled 2020-07-07 (×2): qty 30

## 2020-07-07 MED ORDER — SCOPOLAMINE 1 MG/3DAYS TD PT72
1.0000 | MEDICATED_PATCH | TRANSDERMAL | Status: DC
  Administered 2020-07-07 – 2020-07-10 (×2): 1.5 mg via TRANSDERMAL
  Filled 2020-07-07 (×2): qty 1

## 2020-07-07 MED ORDER — VITAL 1.5 CAL PO LIQD
1000.0000 mL | ORAL | Status: DC
  Administered 2020-07-07 – 2020-07-10 (×5): 1000 mL
  Filled 2020-07-07 (×7): qty 1000

## 2020-07-07 NOTE — Progress Notes (Signed)
SLP Cancellation Note  Patient Details Name: Gregory Wheeler MRN: [id number] DOB: 07-15-1941   Cancelled evaluation: Chart reviewed, situation discussed with nurse.  Has cortrak, is having difficulty managing secretions, Palliative consult pending.  Not ready for repeat instrumental swallow study today.  Will follow along for plan of care and readiness as warranted.  [name], MA CCC/SLP Acute Rehabilitation Services Office number [phone] Pager [phone]       [name] 07/07/2020, 9:13 AM

## 2020-07-07 NOTE — Progress Notes (Addendum)
PROGRESS NOTE                                                                                                                                                                                                             Patient Demographics:    Gregory Wheeler, is a 79 y.o. male, DOB - 07-31-1941, VOH:[id number]  Outpatient Primary MD for the patient is [name], NP    LOS - 15  Admit date - 06/22/2020    Chief Complaint  Patient presents with  . Covid pos  and Not eating or drinking       Brief Narrative (HPI from H&P)   79 year old Caucasian male who was admitted for COVID-19 pneumonia despite being fully vaccinated, his COVID-37 pneumonia was actually quite limited however he went into full-blown DTs and stay was complicated by upper GI bleed for which he was seen by [facility], respiratory arrest resuscitation, critical care was consulted and he was intubated for close to a week, post extubation he is showing signs of ongoing aspiration of oral secretions, extremely frail and weak.  Now wants to be DNR.  We will continue medical treatment and monitor closely for another 24 hours in ICU.  Will involve palliative care as well.   Subjective:    Gregory Wheeler today in bed, appears fatigued, loud gurgling sounds from his oral secretions, denies any headache chest or abdominal pain, clearly states he does not want CPR or back on ventilator.   Assessment  & Plan :     1. Acute Hypoxic Resp. Failure due to Acute Covid 19 Viral Pneumonitis along with aspiration pneumonia causing respiratory arrest - he finished his COVID-19 specific treatment with steroids and Remdesivir, he was briefly intubated due to respiratory arrest and finally extubated on 07/06/2020.  At this time post extubation on 07/06/2020 he seems to have difficulty controlling his oral secretions, he is tube feed dependent through NG tube, loud gurgling, extremely  weak and deconditioned with extremely poor prognosis and high chances of repeat aspiration.  Discussed with the patient he does not want to go back on the ventilator or undergo CPR.  Communicated this to his son.  Continue medical treatment, involve palliative care and speech therapy, DNR, scope of treatment will be medical treatment for reversible problems, if declines then possibly comfort measures.  Will  involve palliative care.    SpO2: 94 % O2 Flow Rate (L/min): 4 L/min FiO2 (%): 40 %   2.  Enterococcal bacteremia.  He is currently on 6 weeks total of IV Rocephin and ampicillin through PICC line.  Poor candidate for TEE.  He has been seen by ID this admission.  Tentative stop date is 08/04/2020 for his antibiotics but he has to see ID before stopping the antibiotics.  3.  Upper GI bleed this admission.  Seen by [name] earlier, underwent EGD on 06/30/2020 showing possible gastritis, on IV PPI, monitor H&H closely.  4.  History of CAD, right bundle branch block, paroxysmal A. fib.  Baseline on Xarelto, due to recent GI bleed currently on prophylactic Lovenox.  Continue beta-blocker.  5.  Urinary retention.  On Foley, resume Flomax once he is able to take pills orally.  6.  Essential hypertension currently on Norvasc, Nitropaste along with beta-blocker.  7.  Excessive oral secretions.  Added scopolamine patch.  8 . Alcohol abuse with DTs earlier this admission.  Seems to be stable right now.  Counseled to quit alcohol.     Condition - Extremely Guarded  Family Communication  : Son [name] [phone] on 07/07/2020, clearly explained to him that his father wants to be DNR that prognosis is extremely guarded.  Code Status :  DNR  Consults  :  Pall Care, PCCM, GI  Procedures  :    PICC 11/1 >   ETT 11/3 >11/10  TTE -   1. Left ventricular ejection fraction, by estimation, is 60 to 65%. The left ventricle has normal function. The left ventricle has no regional wall motion  abnormalities. There is mild left ventricular hypertrophy. Left ventricular diastolic parameters are consistent with Grade I diastolic dysfunction (impaired relaxation).  2. Right ventricular systolic function is moderately reduced. The right ventricular size is normal. There is normal pulmonary artery systolic pressure. The estimated right ventricular systolic pressure is 23.3 mmHg. Abnormal RV, would consider PE.  3. Right atrial size was mildly dilated.  4. The mitral valve is normal in structure. No evidence of mitral valve regurgitation. No evidence of mitral stenosis.  5. The aortic valve is tricuspid. Aortic valve regurgitation is trivial. Mild aortic valve sclerosis is present, with no evidence of aortic valve stenosis.  6. The inferior vena cava is normal in size with greater than 50% respiratory variability, suggesting right atrial pressure of 3 mmHg.  CT -  1. No acute intracranial abnormality. 2. Age related atrophy and chronic small vessel ischemia. 3. Opacification of the lower right greater than left mastoid air cells likely mastoid effusion, progressed from 2017. Small fluid levels in the left and right side of the sphenoid sinus.  PUD Prophylaxis : PPI  Disposition Plan  :    Status is: Inpatient  Remains inpatient appropriate because:IV treatments appropriate due to intensity of illness or inability to take PO   Dispo: The patient is from: Home              Anticipated d/c is to: SNF              Anticipated d/c date is: > 3 days              Patient currently is not medically stable to d/c.  DVT Prophylaxis  :  Lovenox   Lab Results  Component Value Date   PLT 177 07/07/2020    Diet :  Diet Order  Diet NPO time specified Except for: Sips with Meds  Diet effective now                  Inpatient Medications  Scheduled Meds: . allopurinol  300 mg Per Tube QHS  . amLODipine  10 mg Per Tube Daily  . chlorhexidine gluconate (MEDLINE KIT)  15 mL Mouth  Rinse BID  . Chlorhexidine Gluconate Cloth  6 each Topical Daily  . enoxaparin (LOVENOX) injection  100 mg Subcutaneous Q12H  . folic acid  1 mg Per Tube Daily  . insulin aspart  0-6 Units Subcutaneous Q4H  . mouth rinse  15 mL Mouth Rinse 10 times per day  . metoprolol tartrate  12.5 mg Per Tube BID  . multivitamin with minerals  1 tablet Per Tube Daily  . nitroGLYCERIN  1 inch Topical Q6H  . pantoprazole sodium  40 mg Per Tube BID  . sodium chloride flush  10-40 mL Intracatheter Q12H  . sodium chloride flush  3 mL Intravenous Q12H  . thiamine  100 mg Per Tube Daily   Continuous Infusions: . ampicillin (OMNIPEN) IV 2 g (07/07/20 5397)  . cefTRIAXone (ROCEPHIN)  IV 2 g (07/07/20 0837)  . feeding supplement (VITAL 1.5 CAL) 1,000 mL (07/07/20 0824)   PRN Meds:.acetaminophen, guaiFENesin-dextromethorphan, metoprolol tartrate, [DISCONTINUED] ondansetron **OR** ondansetron (ZOFRAN) IV, polyethylene glycol, sodium chloride flush, white petrolatum  Antibiotics  :    Anti-infectives (From admission, onward)   Start     Dose/Rate Route Frequency Ordered Stop   07/01/20 1200  ampicillin (OMNIPEN) 2 g in sodium chloride 0.9 % 100 mL IVPB        2 g 300 mL/hr over 20 Minutes Intravenous Every 4 hours 07/01/20 0955     06/29/20 1201  Ampicillin-Sulbactam (UNASYN) 3 g in sodium chloride 0.9 % 100 mL IVPB  Status:  Discontinued        3 g 200 mL/hr over 30 Minutes Intravenous Every 6 hours 06/29/20 0959 07/01/20 0955   06/29/20 1045  cefTRIAXone (ROCEPHIN) 2 g in sodium chloride 0.9 % 100 mL IVPB        2 g 200 mL/hr over 30 Minutes Intravenous Every 12 hours 06/29/20 0959     06/29/20 1030  cefTRIAXone (ROCEPHIN) 2 g in sodium chloride 0.9 % 100 mL IVPB  Status:  Discontinued        2 g 200 mL/hr over 30 Minutes Intravenous Every 24 hours 06/29/20 0944 06/29/20 0959   06/29/20 0500  vancomycin (VANCOREADY) IVPB 750 mg/150 mL  Status:  Discontinued        750 mg 150 mL/hr over 60 Minutes  Intravenous Every 12 hours 06/28/20 1441 06/29/20 0442   06/28/20 2030  fluconazole (DIFLUCAN) IVPB 100 mg  Status:  Discontinued        100 mg 50 mL/hr over 60 Minutes Intravenous Every 24 hours 06/28/20 1906 07/01/20 1538   06/28/20 2030  Ampicillin-Sulbactam (UNASYN) 3 g in sodium chloride 0.9 % 100 mL IVPB  Status:  Discontinued        3 g 200 mL/hr over 30 Minutes Intravenous Every 8 hours 06/28/20 1940 06/29/20 0959   06/28/20 1600  acyclovir (ZOVIRAX) 110 mg in dextrose 5 % 100 mL IVPB  Status:  Discontinued        110 mg 102.2 mL/hr over 60 Minutes Intravenous Every 8 hours 06/28/20 1441 06/28/20 1448   06/28/20 1600  acyclovir (ZOVIRAX) 900 mg in dextrose 5 % 150 mL IVPB  Status:  Discontinued        900 mg 168 mL/hr over 60 Minutes Intravenous Every 8 hours 06/28/20 1450 06/28/20 1912   06/28/20 1530  vancomycin (VANCOREADY) IVPB 1500 mg/300 mL        1,500 mg 150 mL/hr over 120 Minutes Intravenous  Once 06/28/20 1441 06/28/20 2037   06/27/20 1400  ampicillin (OMNIPEN) 2 g in sodium chloride 0.9 % 100 mL IVPB  Status:  Discontinued        2 g 300 mL/hr over 20 Minutes Intravenous Every 4 hours 06/27/20 1216 06/28/20 1437   06/25/20 1947  Ampicillin-Sulbactam (UNASYN) 3 g in sodium chloride 0.9 % 100 mL IVPB  Status:  Discontinued        3 g 200 mL/hr over 30 Minutes Intravenous Every 6 hours 06/25/20 1419 06/27/20 1218   06/24/20 2200  cefTRIAXone (ROCEPHIN) 2 g in sodium chloride 0.9 % 100 mL IVPB  Status:  Discontinued        2 g 200 mL/hr over 30 Minutes Intravenous Every 12 hours 06/24/20 1513 06/28/20 1437   06/24/20 1230  cefTRIAXone (ROCEPHIN) 2 g in sodium chloride 0.9 % 100 mL IVPB  Status:  Discontinued        2 g 200 mL/hr over 30 Minutes Intravenous Every 24 hours 06/24/20 1139 06/24/20 1513   06/23/20 1430  azithromycin (ZITHROMAX) 500 mg in sodium chloride 0.9 % 250 mL IVPB  Status:  Discontinued        500 mg 250 mL/hr over 60 Minutes Intravenous Every 24 hours  06/22/20 1406 06/22/20 1410   06/23/20 1300  Ampicillin-Sulbactam (UNASYN) 3 g in sodium chloride 0.9 % 100 mL IVPB  Status:  Discontinued        3 g 200 mL/hr over 30 Minutes Intravenous Every 8 hours 06/23/20 1131 06/25/20 1419   06/23/20 1000  remdesivir 100 mg in sodium chloride 0.9 % 100 mL IVPB        100 mg 200 mL/hr over 30 Minutes Intravenous Daily 06/22/20 1345 06/26/20 1200   06/23/20 0945  ampicillin (OMNIPEN) 2 g in sodium chloride 0.9 % 100 mL IVPB  Status:  Discontinued        2 g 300 mL/hr over 20 Minutes Intravenous Every 4 hours 06/23/20 0852 06/23/20 1131   06/22/20 1430  azithromycin (ZITHROMAX) 500 mg in sodium chloride 0.9 % 250 mL IVPB  Status:  Discontinued        500 mg 250 mL/hr over 60 Minutes Intravenous Every 24 hours 06/22/20 1410 06/23/20 0902   06/22/20 1400  remdesivir 100 mg in sodium chloride 0.9 % 100 mL IVPB        100 mg 200 mL/hr over 30 Minutes Intravenous Every 30 min 06/22/20 1345 06/22/20 1518   06/22/20 1400  cefTRIAXone (ROCEPHIN) 2 g in sodium chloride 0.9 % 100 mL IVPB  Status:  Discontinued        2 g 200 mL/hr over 30 Minutes Intravenous Every 24 hours 06/22/20 1357 06/23/20 0902   06/22/20 1400  azithromycin (ZITHROMAX) 500 mg in sodium chloride 0.9 % 250 mL IVPB  Status:  Discontinued        500 mg 250 mL/hr over 60 Minutes Intravenous Every 24 hours 06/22/20 1357 06/22/20 1406       Time Spent in minutes  30   Lala Lund M.D on 07/07/2020 at 9:33 AM  To page go to [email or web address] - password [email or web address]  [facility] -  Office  [phone]   See all Orders from today for further details    Objective:   Vitals:   07/07/20 0600 07/07/20 0700 07/07/20 0800 07/07/20 0900  BP: (!) 186/61 (!) 181/58 (!) 178/57 (!) 173/40  Pulse: (!) 102 (!) 101 (!) 101 (!) 104  Resp: (!) 26 (!) 35 (!) 32 (!) 27  Temp:   99.9 F (37.7 C)   TempSrc:   Axillary   SpO2: 95% 95% 97% 94%  Weight:      Height:        Wt Readings from Last 3  Encounters:  07/07/20 109.1 kg  03/14/20 110.3 kg  09/17/19 106.6 kg     Intake/Output Summary (Last 24 hours) at 07/07/2020 0933 Last data filed at 07/07/2020 0837 Gross per 24 hour  Intake 1926.7 ml  Output 1935 ml  Net -8.3 ml     Physical Exam  Awake but appears extremely weak, NG tube in place, unable to clear his oral secretions with loud gurgling [place].AT,PERRAL Supple Neck,No JVD, No cervical lymphadenopathy appriciated.  Symmetrical Chest wall movement, coarse bilateral breath sounds RRR,No Gallops,Rubs or new Murmurs, No Parasternal Heave +ve B.Sounds, Abd Soft, No tenderness, No organomegaly appriciated, No rebound - guarding or rigidity. No Cyanosis, Clubbing or edema, No new Rash or bruise     Data Review:    CBC Recent Labs  Lab 07/03/20 0339 07/03/20 1500 07/03/20 2042 07/04/20 0513 07/05/20 0500 07/06/20 0500 07/07/20 0515  WBC 8.9  --   --  8.7 9.4 11.0* 10.6*  HGB 8.0*   < > 8.8* 8.3* 7.9* 9.1* 8.5*  HCT 26.0*   < > 28.5* 26.3* 24.6* 28.6* 26.8*  PLT 125*  --   --  146* 135* 169 177  MCV 108.3*  --   --  108.2* 107.4* 105.9* 106.3*  MCH 33.3  --   --  34.2* 34.5* 33.7 33.7  MCHC 30.8  --   --  31.6 32.1 31.8 31.7  RDW 15.9*  --   --  15.4 15.3 15.7* 15.4  LYMPHSABS  --   --   --  0.7  --   --   --   MONOABS  --   --   --  0.8  --   --   --   EOSABS  --   --   --  0.3  --   --   --   BASOSABS  --   --   --  0.0  --   --   --    < > = values in this interval not displayed.    Recent Labs  Lab 07/01/20 0237 07/02/20 0344 07/03/20 0339 07/04/20 0513 07/05/20 0500 07/06/20 0500 07/07/20 0515 07/07/20 0711  NA 142   < > 143 141 142 142 142  --   K 4.1   < > 4.5 4.7 3.8 4.2 4.4  --   CL 112*   < > 111 108 105 101 104  --   CO2 24   < > '26 25 28 30 28  ' --   GLUCOSE 112*   < > 131* 151* 139* 100* 100*  --   BUN 13   < > '14 16 12 12 13  ' --   CREATININE 0.98   < > 0.99 0.81 0.82 0.87 0.92  --   CALCIUM 8.7*   < > 8.9 8.5* 8.8* 9.6 9.6  --     AST 32  --   --   --   --   --   --   --  ALT 42  --   --   --   --   --   --   --   ALKPHOS 46  --   --   --   --   --   --   --   BILITOT 0.5  --   --   --   --   --   --   --   ALBUMIN 2.2*  --   --   --   --   --   --   --   MG 1.4*   < > 1.8 1.3* 1.6* 1.6* 1.8  --   CRP 7.9*  --   --   --   --   --   --   --   DDIMER 0.73*  --   --   --   --   --   --   --   PROCALCITON <0.10  --   --   --   --   --  0.30  --   BNP  --   --   --   --   --   --   --  464.9*   < > = values in this interval not displayed.    ------------------------------------------------------------------------------------------------------------------ Recent Labs    07/05/20 0500  TRIG 100    Lab Results  Component Value Date   HGBA1C 5.9 (H) 06/29/2020   ------------------------------------------------------------------------------------------------------------------ No results for input(s): TSH, T4TOTAL, T3FREE, THYROIDAB in the last 72 hours.  Invalid input(s): FREET3  Cardiac Enzymes No results for input(s): CKMB, TROPONINI, MYOGLOBIN in the last 168 hours.  Invalid input(s): CK ------------------------------------------------------------------------------------------------------------------    Component Value Date/Time   BNP 464.9 (H) 07/07/2020 7062    Micro Results Recent Results (from the past 240 hour(s))  Culture, respiratory (non-expectorated)     Status: None   Collection Time: 06/29/20 11:37 AM   Specimen: Tracheal Aspirate; Respiratory  Result Value Ref Range Status   Specimen Description TRACHEAL ASPIRATE  Final   Special Requests NONE  Final   Gram Stain   Final    FEW WBC PRESENT,BOTH PMN AND MONONUCLEAR RARE BUDDING YEAST SEEN Performed at [facility], [place] [address] [place], [place] [address]    Culture FEW CANDIDA ALBICANS  Final   Report Status 07/01/2020 FINAL  Final    Radiology Reports DG Abd 1 View  Result Date: 06/29/2020 CLINICAL DATA:  OG tube  placement. EXAM: ABDOMEN - 1 VIEW COMPARISON:  Abdominal radiographs June 28, 2020, chest radiograph June 27, 2020 FINDINGS: Orogastric tube courses below the diaphragm with the tip projecting in the expected region of the stomach. Side port is below the gastroesophageal junction. Partially imaged airspace opacities in the lung bases, better characterized on recent chest radiograph. Partially imaged scattered retained contrast within colonic diverticula in the upper abdomen. IMPRESSION: 1. Orogastric tube courses below the diaphragm with the tip projecting in the expected region of the stomach. 2. Bibasilar airspace opacities. Electronically Signed   By: Margaretha Sheffield MD   On: 06/29/2020 11:21   CT HEAD WO CONTRAST  Result Date: 06/30/2020 CLINICAL DATA:  Unresponsive since respiratory arrest yesterday EXAM: CT HEAD WITHOUT CONTRAST TECHNIQUE: Contiguous axial images were obtained from the base of the skull through the vertex without intravenous contrast. COMPARISON:  CT 06/22/2020 FINDINGS: Brain: No evidence of acute infarction, hemorrhage, hydrocephalus, extra-axial collection, visible mass lesion or mass effect. Symmetric prominence of the ventricles, cisterns and sulci compatible with  parenchymal volume loss. Mixed patchy and confluent areas of white matter hypoattenuation are most compatible with chronic microvascular angiopathy. Vascular: Atherosclerotic calcification of the carotid siphons and intradural vertebral arteries. No hyperdense vessel. Skull: No scalp swelling or hematoma. No calvarial fracture or suspicious osseous lesion. Sinuses/Orbits: Minimal thickening in the ethmoids and maxillary sinuses with pneumatized secretions and layering air-fluid levels in the sphenoid sinuses possibly related to instrumentation. Bilateral mastoid effusions are present as well. Middle ear cavities are clear. Orbital structures are unremarkable aside from prior lens extractions. Other: None. IMPRESSION:  1. No CT evidence of acute large territory or cortically based infarct nor other acute intracranial abnormality. If there is persisting concern for acute infarction, MRI is more sensitive and specific for early features of ischemia. 2. Moderate parenchymal volume loss and chronic microvascular angiopathy. 3. Mural thickening in the paranasal sinuses with layering fluid in the sphenoid sinuses and bilateral mastoid effusions. Possibly related to instrumentation/intubation. Electronically Signed   By: Lovena Le M.D.   On: 06/30/2020 01:44   CT HEAD WO CONTRAST  Result Date: 06/22/2020 CLINICAL DATA:  Delirium. EXAM: CT HEAD WITHOUT CONTRAST TECHNIQUE: Contiguous axial images were obtained from the base of the skull through the vertex without intravenous contrast. COMPARISON:  Head CT 05/14/2016 FINDINGS: Brain: No intracranial hemorrhage, mass effect, or midline shift. Age related atrophy. No hydrocephalus. The basilar cisterns are patent. Mild to moderate chronic small vessel ischemia with slight progression from 2017. No evidence of territorial infarct or acute ischemia. No extra-axial or intracranial fluid collection. Vascular: Atherosclerosis of skullbase vasculature without hyperdense vessel or abnormal calcification. Skull: No fracture or focal lesion. Sinuses/Orbits: Opacification of the lower right greater than left mastoid air cells, progressed from 2017. Small fluid levels in the left and right side of sphenoid sinus. No acute orbital abnormality. Bilateral cataract resection. Other: None. IMPRESSION: 1. No acute intracranial abnormality. 2. Age related atrophy and chronic small vessel ischemia. 3. Opacification of the lower right greater than left mastoid air cells likely mastoid effusion, progressed from 2017. Small fluid levels in the left and right side of the sphenoid sinus. Electronically Signed   By: Keith Rake M.D.   On: 06/22/2020 22:36   DG Chest Port 1 View  Result Date:  07/07/2020 CLINICAL DATA:  Shortness of breath.  COVID-19 positive EXAM: PORTABLE CHEST 1 VIEW COMPARISON:  July 05, 2020. FINDINGS: Feeding tube tip is below the diaphragm. Endotracheal tube no longer present. Central catheter tip is in the superior vena cava. No pneumothorax. Areas of ill-defined opacity present in the mid and lower lung regions with somewhat less opacity bilaterally compared to 2 days prior. Stable apical pleural thickening. Stable cardiac prominence with pulmonary vascularity normal. Sizable hiatal type hernia present. IMPRESSION: Patchy airspace opacity bilaterally, slightly less compared to 2 days prior. Stable cardiomegaly. Sizable hiatal type hernia. Feeding tube tip below diaphragm. Electronically Signed   By: Lowella Grip III M.D.   On: 07/07/2020 08:04   DG CHEST PORT 1 VIEW  Result Date: 07/05/2020 CLINICAL DATA:  Pneumonia EXAM: PORTABLE CHEST 1 VIEW COMPARISON:  Six days ago FINDINGS: Endotracheal tube with tip halfway between the clavicular heads and carina. Feeding tube at least reaches the stomach. Left PICC with tip at the SVC. Patchy bilateral pneumonia with progression the left base. No visible effusion or pneumothorax. Cardiomegaly. IMPRESSION: 1. Unremarkable hardware. 2. Multifocal pneumonia with progression at the left base. Electronically Signed   By: Monte Fantasia M.D.   On: 07/05/2020 06:14  DG CHEST PORT 1 VIEW  Result Date: 06/29/2020 CLINICAL DATA:  Intubation EXAM: PORTABLE CHEST 1 VIEW COMPARISON:  Two days ago FINDINGS: Endotracheal tube with tip between the clavicular heads and carina. Enteric tube with side port seen over the stomach. Left PICC with tip near the SVC brachiocephalic junction. Patchy bilateral infiltrate. Cardiopericardial enlargement accentuated by rightward rotation. No visible effusion or pneumothorax. IMPRESSION: 1. Intubation with unremarkable hardware. 2. Atypical pneumonia with mild progression from 2 days ago.  Electronically Signed   By: Monte Fantasia M.D.   On: 06/29/2020 05:07   DG CHEST PORT 1 VIEW  Result Date: 06/27/2020 CLINICAL DATA:  PICC in place EXAM: PORTABLE CHEST 1 VIEW COMPARISON:  06/27/2020 FINDINGS: Left arm PICC terminates in the mid SVC. Subpleural patchy opacities in the lungs bilaterally, left lower lobe predominant, compatible with pneumonia in this patient with known COVID. No pleural effusion or pneumothorax. The heart is top-normal in size. IMPRESSION: Left arm PICC terminates in the mid SVC. Multifocal pneumonia in this patient with known COVID. Electronically Signed   By: Julian Hy M.D.   On: 06/27/2020 09:35   DG Chest Port 1 View  Result Date: 06/27/2020 CLINICAL DATA:  Shortness of breath, COVID-19 positive. EXAM: PORTABLE CHEST 1 VIEW COMPARISON:  June 23, 2020. FINDINGS: Stable cardiomegaly. Stable multiple airspace opacities are noted bilaterally consistent with multifocal pneumonia. No pneumothorax or pleural effusion is noted. Bony thorax is unremarkable. IMPRESSION: Stable bilateral multifocal pneumonia. Electronically Signed   By: Marijo Conception M.D.   On: 06/27/2020 08:07   DG Chest Port 1 View  Result Date: 06/23/2020 CLINICAL DATA:  Shortness of breath and COVID. EXAM: PORTABLE CHEST 1 VIEW COMPARISON:  06/22/2020 FINDINGS: 0846 hours. Low lung volumes. Progressive patchy ground-glass and consolidative airspace opacity in the lower lungs. The cardio pericardial silhouette is enlarged. No substantial pleural effusion. The visualized bony structures of the thorax show no acute abnormality. Telemetry leads overlie the chest. IMPRESSION: Progressive bibasilar airspace disease consistent with multifocal pneumonia. Electronically Signed   By: Misty Stanley M.D.   On: 06/23/2020 09:04   DG Chest Port 1 View  Result Date: 06/22/2020 CLINICAL DATA:  COVID, altered mental status. EXAM: PORTABLE CHEST 1 VIEW COMPARISON:  10/19/2014. FINDINGS: Low lung volumes.  Subtle airspace opacities in the peripheral lung bases. No visible pleural effusions or pneumothorax. Cardiomediastinal silhouette is within normal limits. No evidence of acute osseous abnormality. IMPRESSION: Subtle airspace opacities in the peripheral lung bases, concerning for multifocal pneumonia and consistent with reported COVID diagnosis. Electronically Signed   By: Margaretha Sheffield MD   On: 06/22/2020 11:39   DG Abd Portable 1V  Result Date: 06/28/2020 CLINICAL DATA:  Nausea, vomiting blood, COVID-19, atrial fibrillation, coronary artery disease, hypertension, former smoker EXAM: PORTABLE ABDOMEN - 1 VIEW COMPARISON:  Portable exam 1013 hours compared to CT abdomen and pelvis of 06/20/2018 FINDINGS: Scattered retained contrast in colonic diverticula and rectum. Nonobstructive bowel gas pattern. No bowel dilatation or bowel wall thickening. Bones demineralized with degenerative changes lumbar spine. IMPRESSION: Colonic diverticulosis. Electronically Signed   By: Lavonia Dana M.D.   On: 06/28/2020 10:25   DG Swallowing Func-Speech Pathology  Result Date: 06/23/2020 Objective Swallowing Evaluation: Type of Study: MBS-Modified Barium Swallow Study  Patient Details Name: FLEMING PRILL MRN: [id number] Date of Birth: 08/20/1941 Today's Date: 06/23/2020 Time: SLP Start Time (ACUTE ONLY): 8280 -SLP Stop Time (ACUTE ONLY): 0349 SLP Time Calculation (min) (ACUTE ONLY): 20 min Past Medical History: Past  Medical History: Diagnosis Date . Arthritis  . Atrial fibrillation ([place]) 01/31/2015 . Chronic gout  . Coronary artery disease 12/2007  STATUS POST STENTING OF THE LEFT ANTERIOR DESCENDING ARTERY . COVID-19  . Dyslipidemia  . Gout  . Hiatal hernia  . Hyperlipidemia  . Hypertension  . LBP (low back pain)  . Partial small bowel obstruction ([place])  . RBBB (right bundle branch block)  . SBO (small bowel obstruction) ([facility])   PARTIAL . Second degree AV block, Mobitz type I  . Urinary tract infection   Escherichia coli  urinary tract infection Past Surgical History: Past Surgical History: Procedure Laterality Date . CARDIAC CATHETERIZATION   . HERNIA REPAIR    INGUINAL . ROTATOR CUFF REPAIR   HPI: 79 yo male presenting to ED with generalized weakness, poor food intake, and confusion. COVID positive. PMH including hiatal hernia, arthritis, A-fib, HTN, AKI, alcohol abuse, and CAD with s/p stenting of L anterior descending artery. CXR Progressive bibasilar airspace disease consistent with multifocal pneumonia.  No data recorded Assessment / Plan / Recommendation CHL IP CLINICAL IMPRESSIONS 06/23/2020 Clinical Impression Pt exhibits mild oropharyngeal dysphagia with aspiration of nectar consistency, and mild vallecular residual. 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 Pt unable to contain entire bolus in oral cavity and moderate amount nectar thick fell over tongue and was aspirated prior to initiation of laryngeal closure (frank volume) with immediate cough. Timing of closure delayed with thin using chin tuck and slight aspiraiton present. Lingual residue present falling into valleculae post swallow. Chin down posture using straw was effective with nectar however pt displays memory deficit and follow through of strategy is questionable. 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 Recommend honey thick liquids, Dys 3, crush meds. full supervision/assist. SLP will work with pt on chin tuck with nectar for possible upgrade.  SLP Visit Diagnosis Dysphagia, oropharyngeal phase (R13.12) Attention and concentration deficit following -- Frontal lobe and executive function deficit following -- Impact on safety and function Mild aspiration risk;Moderate aspiration risk   CHL IP TREATMENT RECOMMENDATION 06/23/2020 Treatment Recommendations Therapy as outlined in treatment plan below   Prognosis 06/23/2020 Prognosis for  Safe Diet Advancement (No Data) Barriers to Reach Goals Cognitive deficits Barriers/Prognosis Comment -- CHL IP DIET RECOMMENDATION 06/23/2020 SLP Diet Recommendations Dysphagia 3 (Mech soft) solids;Honey thick liquids Liquid Administration via Cup Medication Administration Crushed with puree Compensations Minimize environmental distractions;Slow rate;Small sips/bites Postural Changes Seated upright at 90 degrees;Remain semi-upright after after feeds/meals (Comment)   CHL IP OTHER RECOMMENDATIONS 06/23/2020 Recommended Consults -- Oral Care Recommendations Oral care BID Other Recommendations --   CHL IP FOLLOW UP RECOMMENDATIONS 06/23/2020 Follow up Recommendations (No Data)   CHL IP FREQUENCY AND DURATION 06/23/2020 Speech Therapy Frequency (ACUTE ONLY) min 2x/week Treatment Duration 2 weeks      CHL IP ORAL PHASE 06/23/2020 Oral Phase Impaired Oral - Pudding Teaspoon -- Oral - Pudding Cup -- Oral - Honey Teaspoon -- Oral - Honey Cup Lingual/palatal residue;Piecemeal swallowing Oral - Nectar Teaspoon -- Oral - Nectar Cup Decreased bolus cohesion Oral - Nectar Straw Decreased bolus cohesion Oral - Thin Teaspoon -- Oral - Thin Cup -- Oral - Thin Straw -- Oral - Puree Piecemeal swallowing Oral - Mech Soft -- Oral - Regular -- Oral - Multi-Consistency -- Oral - Pill -- Oral Phase - Comment --  CHL IP PHARYNGEAL PHASE 06/23/2020 Pharyngeal Phase Impaired Pharyngeal- Pudding Teaspoon --  Pharyngeal -- Pharyngeal- Pudding Cup -- Pharyngeal -- Pharyngeal- Honey Teaspoon -- Pharyngeal -- Pharyngeal- Honey Cup Pharyngeal residue - valleculae Pharyngeal -- Pharyngeal- Nectar Teaspoon -- Pharyngeal -- Pharyngeal- Nectar Cup Penetration/Aspiration before swallow Pharyngeal Material enters airway, passes BELOW cords and not ejected out despite cough attempt by patient Pharyngeal- Nectar Straw Penetration/Aspiration during swallow Pharyngeal Material enters airway, remains ABOVE vocal cords then ejected out;Material does not enter  airway Pharyngeal- Thin Teaspoon NT Pharyngeal -- Pharyngeal- Thin Cup NT Pharyngeal -- Pharyngeal- Thin Straw Penetration/Aspiration during swallow Pharyngeal Material enters airway, passes BELOW cords without attempt by patient to eject out (silent aspiration) Pharyngeal- Puree Pharyngeal residue - valleculae Pharyngeal -- Pharyngeal- Mechanical Soft -- Pharyngeal -- Pharyngeal- Regular WFL Pharyngeal -- Pharyngeal- Multi-consistency -- Pharyngeal -- Pharyngeal- Pill -- Pharyngeal -- Pharyngeal Comment --  CHL IP CERVICAL ESOPHAGEAL PHASE 06/23/2020 Cervical Esophageal Phase WFL Pudding Teaspoon -- Pudding Cup -- Honey Teaspoon -- Honey Cup -- Nectar Teaspoon -- Nectar Cup -- Nectar Straw -- Thin Teaspoon -- Thin Cup -- Thin Straw -- Puree -- Mechanical Soft -- Regular -- Multi-consistency -- Pill -- Cervical Esophageal Comment -- [place] Siren 06/23/2020, 4:04 PM Orbie Pyo Colvin Caroli.Ed Actor Pager [phone] Office [phone]              ECHOCARDIOGRAM COMPLETE  Result Date: 06/23/2020    ECHOCARDIOGRAM REPORT   Patient Name:   MASIAH WOODY Date of Exam: 06/23/2020 Medical Rec #:  [id number]      Height:       71.0 in Accession #:    [id number]     Weight:       244.7 lb Date of Birth:  Jul 15, 1941      BSA:          2.297 m Patient Age:    24 years       BP:           170/76 mmHg Patient Gender: M              HR:           71 bpm. Exam Location:  Inpatient Procedure: 2D Echo, Cardiac Doppler and Color Doppler Indications:    R07.9* Chest pain, unspecified. Elevated troponin  History:        Patient has prior history of Echocardiogram examinations, most                 recent 02/24/2019. CAD, Abnormal ECG, Arrythmias:RBBB,                 Signs/Symptoms:Hypotension; Risk Factors:Dyslipidemia. Covid 19                 positive. ETOH.  Sonographer:    Roseanna Rainbow RDCS Referring Phys: [place]  Sonographer Comments: Patient in Fowlaer's position. IMPRESSIONS  1.  Left ventricular ejection fraction, by estimation, is 60 to 65%. The left ventricle has normal function. The left ventricle has no regional wall motion abnormalities. There is mild left ventricular hypertrophy. Left ventricular diastolic parameters are consistent with Grade I diastolic dysfunction (impaired relaxation).  2. Right ventricular systolic function is moderately reduced. The right ventricular size is normal. There is normal pulmonary artery systolic pressure. The estimated right ventricular systolic pressure is 54.0 mmHg. Abnormal RV, would consider PE.  3. Right atrial size was mildly dilated.  4. The mitral valve is normal in structure. No evidence of mitral valve regurgitation. No evidence of mitral stenosis.  5. The aortic  valve is tricuspid. Aortic valve regurgitation is trivial. Mild aortic valve sclerosis is present, with no evidence of aortic valve stenosis.  6. The inferior vena cava is normal in size with greater than 50% respiratory variability, suggesting right atrial pressure of 3 mmHg. FINDINGS  Left Ventricle: Left ventricular ejection fraction, by estimation, is 60 to 65%. The left ventricle has normal function. The left ventricle has no regional wall motion abnormalities. The left ventricular internal cavity size was normal in size. There is  mild left ventricular hypertrophy. Left ventricular diastolic parameters are consistent with Grade I diastolic dysfunction (impaired relaxation). Right Ventricle: The right ventricular size is normal. No increase in right ventricular wall thickness. Right ventricular systolic function is moderately reduced. There is normal pulmonary artery systolic pressure. The tricuspid regurgitant velocity is 2.75 m/s, and with an assumed right atrial pressure of 3 mmHg, the estimated right ventricular systolic pressure is 94.7 mmHg. Left Atrium: Left atrial size was normal in size. Right Atrium: Right atrial size was mildly dilated. Pericardium: There is no  evidence of pericardial effusion. Mitral Valve: The mitral valve is normal in structure. No evidence of mitral valve regurgitation. No evidence of mitral valve stenosis. Tricuspid Valve: The tricuspid valve is normal in structure. Tricuspid valve regurgitation is trivial. Aortic Valve: The aortic valve is tricuspid. Aortic valve regurgitation is trivial. Mild aortic valve sclerosis is present, with no evidence of aortic valve stenosis. Pulmonic Valve: The pulmonic valve was normal in structure. Pulmonic valve regurgitation is not visualized. Aorta: The aortic root is normal in size and structure. Venous: The inferior vena cava is normal in size with greater than 50% respiratory variability, suggesting right atrial pressure of 3 mmHg. IAS/Shunts: No atrial level shunt detected by color flow Doppler.  LEFT VENTRICLE PLAX 2D LVIDd:         3.80 cm     Diastology LVIDs:         2.60 cm     LV e' medial:    4.35 cm/s LV PW:         1.70 cm     LV E/e' medial:  16.9 LV IVS:        1.50 cm     LV e' lateral:   5.33 cm/s LVOT diam:     2.30 cm     LV E/e' lateral: 13.8 LV SV:         97 LV SV Index:   42 LVOT Area:     4.15 cm  LV Volumes (MOD) LV vol d, MOD A2C: 78.7 ml LV vol d, MOD A4C: 84.2 ml LV vol s, MOD A2C: 17.5 ml LV vol s, MOD A4C: 25.2 ml LV SV MOD A2C:     61.2 ml LV SV MOD A4C:     84.2 ml LV SV MOD BP:      64.3 ml RIGHT VENTRICLE             IVC RV S prime:     18.50 cm/s  IVC diam: 2.20 cm TAPSE (M-mode): 3.2 cm LEFT ATRIUM             Index       RIGHT ATRIUM           Index LA diam:        3.70 cm 1.61 cm/m  RA Area:     18.30 cm LA Vol (A2C):   34.1 ml 14.84 ml/m RA Volume:   53.10 ml  23.11 ml/m LA  Vol (A4C):   49.7 ml 21.63 ml/m LA Biplane Vol: 43.7 ml 19.02 ml/m  AORTIC VALVE LVOT Vmax:   124.00 cm/s LVOT Vmean:  89.800 cm/s LVOT VTI:    0.233 m  AORTA Ao Root diam: 3.50 cm MITRAL VALVE               TRICUSPID VALVE MV Area (PHT): 3.21 cm    TR Peak grad:   30.2 mmHg MV Decel Time: 236 msec     TR Vmax:        275.00 cm/s MV E velocity: 73.30 cm/s MV A velocity: 99.05 cm/s  SHUNTS MV E/A ratio:  0.74        Systemic VTI:  0.23 m                            Systemic Diam: 2.30 cm Loralie Champagne MD Electronically signed by Loralie Champagne MD Signature Date/Time: 06/23/2020/4:53:51 PM    Final    [place] EKG SITE RITE  Result Date: 06/29/2020 If Site Rite image not attached, placement could not be confirmed due to current cardiac rhythm.  [place] EKG SITE RITE  Result Date: 06/25/2020 If Site Rite image not attached, placement could not be confirmed due to current cardiac rhythm.

## 2020-07-07 NOTE — Consult Note (Signed)
Consultation Note Date: 07/07/2020   Patient Name: Gregory Wheeler  DOB: 09/27/1940  MRN: [id number]  Age / Sex: 79 y.o., male  PCP: [name], NP Referring Physician: Thurnell Lose, MD  Reason for Consultation: Establishing goals of care  HPI/Patient Profile: 79 y.o. male  with past medical history of coronary artery disease, gout, heavy alcohol use, atrial fib, admitted on 06/22/2020 with mild Covid pneumonia, aspiration pneumonia, bacteremia with Enterococcus.  Admission has been complicated by delirium tremens and upper GI bleed resulting in intubation on November 3.  He was extubated on November 9.  He is extremely weak, unable to control copious secretions.  Appears to continue to be aspirating.  Palliative medicine consulted for goals of care.  Clinical Assessment and Goals of Care:  I have reviewed medical records including EPIC notes, labs and imaging, received report from Dr. Candiss Norse, examined the patient and called patient's son, Gregory Wheeler to discuss diagnosis prognosis, GOC, EOL wishes, disposition and options.  Patient was in bed, extremely weak, he was unable to answer when I asked his name.  He was using suction to control secretions.  After some prompting he was able to tell me his name, and he reached out to hold my hand.  His voice was very weak, and it was apparent it took a great deal of effort for him to talk.  He pointed to the IV pole infusing antibiotics with questioning look, and I explained to him it was antibiotics for his blood infection.  I asked him very plainly if his heart stopped or he stopped breathing would he prefer CPR and to go on a ventilator or would he prefer to go see God?  With a great amount of effort he replied go see God.  I repeated you would rather go see guide and that would mean dying.  Gregory Wheeler shook his head yes.  I called his son Gregory Wheeler.  I established that Gregory Wheeler  has a sister Gregory Wheeler and together they make decisions.  Primary Decision Maker NEXT OF KIN- patient's children Gregory Wheeler and Gregory Wheeler    SUMMARY OF RECOMMENDATIONS -Continue current scope of care -Gregory Wheeler to confer with Gregory Wheeler and call me back with a time to meet in person here at the hospital and further discuss goals of care -Patient clearly expressed desire for DNR, however will wait to order this until speaking with patient's children   Code Status/Advance Care Planning:  Full code  Prognosis:    Unable to determine  Discharge Planning: To Be Determined  Primary Diagnoses: Present on Admission: . Pneumonia due to COVID-19 virus . RBBB (right bundle branch block) . Hypertension . Coronary artery disease . Chronic atrial fibrillation ([place]) . Alcohol abuse . AKI (acute kidney injury) ([place]) . Dehydration . Acute respiratory failure due to COVID-19 ([place]) . Hypokalemia . Acute metabolic encephalopathy   I have reviewed the medical record, interviewed the patient and family, and examined the patient. The following aspects are pertinent.  Past Medical History:  Diagnosis Date  . Arthritis   .  Atrial fibrillation ([place]) 01/31/2015  . Chronic gout   . Coronary artery disease 12/2007   STATUS POST STENTING OF THE LEFT ANTERIOR DESCENDING ARTERY  . COVID-19   . Dyslipidemia   . Gout   . Hiatal hernia   . Hyperlipidemia   . Hypertension   . LBP (low back pain)   . Partial small bowel obstruction ([place])   . RBBB (right bundle branch block)   . SBO (small bowel obstruction) ([facility])    PARTIAL  . Second degree AV block, Mobitz type I   . Urinary tract infection    Escherichia coli urinary tract infection   Social History   Socioeconomic History  . Marital status: Married    Spouse name: Not on file  . Number of children: 3  . Years of education: Not on file  . Highest education level: Not on file  Occupational History  . Occupation: used Teacher, early years/pre  Tobacco Use  . Smoking  status: Former Smoker    Quit date: 08/27/1990    Years since quitting: 29.8  . Smokeless tobacco: Never Used  Vaping Use  . Vaping Use: Never used  Substance and Sexual Activity  . Alcohol use: Yes    Comment: near daily  . Drug use: No  . Sexual activity: Not on file  Other Topics Concern  . Not on file  Social History Narrative  . Not on file   Social Determinants of Health   Financial Resource Strain:   . Difficulty of Paying Living Expenses: Not on file  Food Insecurity:   . Worried About Charity fundraiser in the Last Year: Not on file  . Ran Out of Food in the Last Year: Not on file  Transportation Needs:   . Lack of Transportation (Medical): Not on file  . Lack of Transportation (Non-Medical): Not on file  Physical Activity:   . Days of Exercise per Week: Not on file  . Minutes of Exercise per Session: Not on file  Stress:   . Feeling of Stress : Not on file  Social Connections:   . Frequency of Communication with Friends and Family: Not on file  . Frequency of Social Gatherings with Friends and Family: Not on file  . Attends Religious Services: Not on file  . Active Member of Clubs or Organizations: Not on file  . Attends Archivist Meetings: Not on file  . Marital Status: Not on file   Scheduled Meds: . allopurinol  300 mg Per Tube QHS  . amLODipine  10 mg Per Tube Daily  . chlorhexidine gluconate (MEDLINE KIT)  15 mL Mouth Rinse BID  . Chlorhexidine Gluconate Cloth  6 each Topical Daily  . enoxaparin (LOVENOX) injection  100 mg Subcutaneous Q12H  . folic acid  1 mg Per Tube Daily  . insulin aspart  0-6 Units Subcutaneous Q4H  . mouth rinse  15 mL Mouth Rinse 10 times per day  . metoprolol tartrate  12.5 mg Per Tube BID  . multivitamin with minerals  1 tablet Per Tube Daily  . nitroGLYCERIN  1 inch Topical Q6H  . pantoprazole sodium  40 mg Per Tube BID  . scopolamine  1 patch Transdermal Q72H  . sodium chloride flush  10-40 mL Intracatheter Q12H   . sodium chloride flush  3 mL Intravenous Q12H  . thiamine  100 mg Per Tube Daily   Continuous Infusions: . ampicillin (OMNIPEN) IV 2 g (07/07/20 1328)  . cefTRIAXone (ROCEPHIN)  IV 2 g (07/07/20 0837)  . feeding supplement (VITAL 1.5 CAL) 1,000 mL (07/07/20 0824)   PRN Meds:.acetaminophen, guaiFENesin-dextromethorphan, metoprolol tartrate, [DISCONTINUED] ondansetron **OR** ondansetron (ZOFRAN) IV, polyethylene glycol, sodium chloride flush, white petrolatum Medications Prior to Admission:  Prior to Admission medications   Medication Sig Start Date End Date Taking? Authorizing Provider  acetaminophen (TYLENOL) 325 MG tablet Take 650 mg by mouth 2 (two) times daily.   Yes [provider]  allopurinol (ZYLOPRIM) 300 MG tablet Take 1 tablet (300 mg total) by mouth daily. Patient taking differently: Take 300 mg by mouth at bedtime.  12/01/18  Yes Martinique, Peter M, MD  ALPRAZolam Duanne Moron) 0.5 MG tablet Take 0.5-1 mg by mouth at bedtime.   Yes [provider]  ascorbic acid (VITAMIN C) 1000 MG tablet Take 1,000 mg by mouth daily.   Yes [provider]  famotidine (PEPCID) 40 MG tablet Take 40 mg by mouth daily as needed for heartburn or indigestion.   Yes [provider]  folic acid (FOLVITE) 1 MG tablet Take 2 mg by mouth daily.  01/21/19  Yes [provider]  metoprolol succinate (TOPROL-XL) 50 MG 24 hr tablet TAKE 1 TABLET DAILY. TAKE WITH OR IMMEDIATELY FOLLOWING A MEAL. Patient taking differently: Take 50 mg by mouth daily. TAKE WITH OR IMMEDIATELY FOLLOWING A MEAL. 09/16/19  Yes [place], Peter M, MD  rivaroxaban (XARELTO) 20 MG TABS tablet Take 1 tablet (20 mg total) by mouth daily with supper. 03/14/20  Yes [place], Peter M, MD  zinc gluconate 50 MG tablet Take 50 mg by mouth daily.   Yes [provider]  ALPRAZolam (XANAX) 0.25 MG tablet Take 1 tablet (0.25 mg total) by mouth 3 (three) times daily as needed for anxiety. 07/03/18   [place], Peter  M, MD  iron polysaccharides (NIFEREX) 150 MG capsule Take 1 capsule (150 mg total) by mouth daily. 09/17/19   [place], Peter M, MD   Allergies  Allergen Reactions  . Lorazepam Other (See Comments)    Extreme delirium    Review of Systems  Unable to perform ROS   Physical Exam Vitals and nursing note reviewed.  Constitutional:      Appearance: He is ill-appearing.  HENT:     Mouth/Throat:     Comments: Copious clear secretions- not able to swallow Cardiovascular:     Comments:  Diffuse anasarca Pulmonary:     Comments: Weak cough Neurological:     Mental Status: He is alert.     Vital Signs: BP 130/85   Pulse 94   Temp 99.6 F (37.6 C) (Axillary)   Resp (!) 26   Ht '5\' 11"'  (1.803 m)   Wt 109.1 kg   SpO2 92%   BMI 33.55 kg/m  Pain Scale: 0-10 POSS *See Group Information*: 1-Acceptable,Awake and alert Pain Score: 0-No pain   SpO2: SpO2: 92 % O2 Device:SpO2: 92 % O2 Flow Rate: .O2 Flow Rate (L/min): 4 L/min  IO: Intake/output summary:   Intake/Output Summary (Last 24 hours) at 07/07/2020 1345 Last data filed at 07/07/2020 1155 Gross per 24 hour  Intake 1520 ml  Output 1910 ml  Net -390 ml    LBM: Last BM Date:  (07/07/2020) Baseline Weight: Weight: 111 kg Most recent weight: Weight: 109.1 kg     Palliative Assessment/Data: PPS: 10%     Thank you for this consult. Palliative medicine will continue to follow and assist as needed.   Time In: 1258 Time Out: 1406 Time Total: 68  mins Greater than 50%  of this time was spent counseling and coordinating care related to the above assessment and plan.  Signed by: Mariana Kaufman, AGNP-C Palliative Medicine    Please contact Palliative Medicine Team phone at [phone] for questions and concerns.  For individual provider: See Shea Evans

## 2020-07-08 DIAGNOSIS — G9340 Encephalopathy, unspecified: Secondary | ICD-10-CM | POA: Diagnosis not present

## 2020-07-08 LAB — CBC WITH DIFFERENTIAL/PLATELET
Abs Immature Granulocytes: 0.05 10*3/uL (ref 0.00–0.07)
Basophils Absolute: 0 10*3/uL (ref 0.0–0.1)
Basophils Relative: 0 %
Eosinophils Absolute: 0.4 10*3/uL (ref 0.0–0.5)
Eosinophils Relative: 4 %
HCT: 25.6 % — ABNORMAL LOW (ref 39.0–52.0)
Hemoglobin: 7.9 g/dL — ABNORMAL LOW (ref 13.0–17.0)
Immature Granulocytes: 1 %
Lymphocytes Relative: 12 %
Lymphs Abs: 1.1 10*3/uL (ref 0.7–4.0)
MCH: 33.2 pg (ref 26.0–34.0)
MCHC: 30.9 g/dL (ref 30.0–36.0)
MCV: 107.6 fL — ABNORMAL HIGH (ref 80.0–100.0)
Monocytes Absolute: 0.6 10*3/uL (ref 0.1–1.0)
Monocytes Relative: 7 %
Neutro Abs: 6.8 10*3/uL (ref 1.7–7.7)
Neutrophils Relative %: 76 %
Platelets: 193 10*3/uL (ref 150–400)
RBC: 2.38 MIL/uL — ABNORMAL LOW (ref 4.22–5.81)
RDW: 15.5 % (ref 11.5–15.5)
WBC: 9.1 10*3/uL (ref 4.0–10.5)
nRBC: 0 % (ref 0.0–0.2)

## 2020-07-08 LAB — GLUCOSE, CAPILLARY
Glucose-Capillary: 100 mg/dL — ABNORMAL HIGH (ref 70–99)
Glucose-Capillary: 107 mg/dL — ABNORMAL HIGH (ref 70–99)
Glucose-Capillary: 113 mg/dL — ABNORMAL HIGH (ref 70–99)
Glucose-Capillary: 88 mg/dL (ref 70–99)
Glucose-Capillary: 92 mg/dL (ref 70–99)
Glucose-Capillary: 99 mg/dL (ref 70–99)

## 2020-07-08 LAB — COMPREHENSIVE METABOLIC PANEL
ALT: 32 U/L (ref 0–44)
AST: 46 U/L — ABNORMAL HIGH (ref 15–41)
Albumin: 2 g/dL — ABNORMAL LOW (ref 3.5–5.0)
Alkaline Phosphatase: 64 U/L (ref 38–126)
Anion gap: 11 (ref 5–15)
BUN: 14 mg/dL (ref 8–23)
CO2: 27 mmol/L (ref 22–32)
Calcium: 9.7 mg/dL (ref 8.9–10.3)
Chloride: 104 mmol/L (ref 98–111)
Creatinine, Ser: 0.94 mg/dL (ref 0.61–1.24)
GFR, Estimated: >60 mL/min (ref >60)
Glucose, Bld: 110 mg/dL — ABNORMAL HIGH (ref 70–99)
Potassium: 4.4 mmol/L (ref 3.5–5.1)
Sodium: 142 mmol/L (ref 135–145)
Total Bilirubin: 0.5 mg/dL (ref 0.3–1.2)
Total Protein: 5.5 g/dL — ABNORMAL LOW (ref 6.5–8.1)

## 2020-07-08 LAB — D-DIMER, QUANTITATIVE: D-Dimer, Quant: 1.86 ug/mL-FEU — ABNORMAL HIGH (ref 0.00–0.50)

## 2020-07-08 LAB — PROCALCITONIN: Procalcitonin: 0.27 ng/mL

## 2020-07-08 LAB — C-REACTIVE PROTEIN: CRP: 2.5 mg/dL — ABNORMAL HIGH (ref <1.0)

## 2020-07-08 LAB — BRAIN NATRIURETIC PEPTIDE: B Natriuretic Peptide: 255.3 pg/mL — ABNORMAL HIGH (ref 0.0–100.0)

## 2020-07-08 MED ORDER — HYDRALAZINE HCL 20 MG/ML IJ SOLN
5.0000 mg | INTRAMUSCULAR | Status: DC | PRN
  Administered 2020-07-09 (×3): 5 mg via INTRAVENOUS
  Filled 2020-07-08 (×3): qty 1

## 2020-07-08 NOTE — Progress Notes (Signed)
PT Cancellation Note  Patient Details Name: Gregory Wheeler MRN: [id number] DOB: 12-07-1940   Cancelled Treatment:    Reason Eval/Treat Not Completed: Other (comment) (pt currently not responding (movement or speech) per RN and await palliative meeting today to determine GOC)   [name] 07/08/2020, 10:52 AM  [name], PT Acute Rehabilitation Services Pager: [phone] Office: [phone]

## 2020-07-08 NOTE — Progress Notes (Signed)
PROGRESS NOTE                                                                                                                                                                                                             Patient Demographics:    Gregory Wheeler, is a 79 y.o. male, DOB - [birth year], HWE:[id number]  Outpatient Primary MD for the patient is [name], NP    LOS - 16  Admit date - 06/22/2020    Chief Complaint  Patient presents with  . Covid pos  and Not eating or drinking       Brief Narrative (HPI from H&P)   79 year old Caucasian male who was admitted for COVID-19 pneumonia despite being fully vaccinated, his COVID-56 pneumonia was actually quite limited however he went into full-blown DTs and stay was complicated by upper GI bleed for which he was seen by [facility], respiratory arrest resuscitation, critical care was consulted and he was intubated for close to a week, post extubation he is showing signs of ongoing aspiration of oral secretions, extremely frail and weak.  Now wants to be DNR.  We will continue medical treatment and monitor closely for another 24 hours in ICU.  Will involve palliative care as well.   Subjective:   Patient in bed appears extremely weak and tired, answers a few questions appropriately and denies headache chest pain or abdominal pain.   Assessment  & Plan :     1. Acute Hypoxic Resp. Failure due to Acute Covid 19 Viral Pneumonitis along with aspiration pneumonia causing respiratory arrest - he finished his COVID-19 specific treatment with steroids and Remdesivir, he was briefly intubated due to respiratory arrest and finally extubated on 07/06/2020.  At this time post extubation on 07/06/2020 he seems to have difficulty controlling his oral secretions, he is tube feed dependent through NG tube, loud gurgling, extremely weak and deconditioned with extremely poor prognosis and high  chances of repeat aspiration.  Discussed with the patient he does not want to go back on the ventilator or undergo CPR, palliative care had similar discussion with same answer, palliative care meeting with family on 07/08/2020 for goals of care, for now continue present medical treatment.  SpO2: 95 % O2 Flow Rate (L/min): 2 L/min FiO2 (%): 40 %   2.  Enterococcal bacteremia.  He  is currently on 6 weeks total of IV Rocephin and ampicillin through PICC line with a tentative stop date of 08/04/2020.  Poor candidate for TEE.  He has been seen by ID this admission.  For stopping his antibiotic he has to see ID.  3.  Upper GI bleed this admission.  Seen by [name] earlier, underwent EGD on 06/30/2020 showing possible gastritis, on IV PPI, monitor H&H closely.  4.  History of CAD, right bundle branch block, paroxysmal A. fib.  Baseline on Xarelto, due to recent GI bleed currently on full dose Lovenox.  Continue beta-blocker.  5.  Urinary retention.  On Foley, resume Flomax once he is able to take pills orally.  6.  Essential hypertension currently on Norvasc, Nitropaste along with beta-blocker.  7.  Excessive oral secretions.  Added scopolamine patch, secretions have improved.  8 . Alcohol abuse with DTs earlier this admission.  Seems to be stable right now.  Counseled to quit alcohol.     Condition - Extremely Guarded  Family Communication  : Son [name] [phone] on 07/07/2020, clearly explained to him that his father wants to be DNR that prognosis is extremely guarded.  Code Status :  DNR  Consults  :  Pall Care, PCCM, GI  Procedures  :    PICC 11/1 >   ETT 11/3 >11/10  TTE -   1. Left ventricular ejection fraction, by estimation, is 60 to 65%. The left ventricle has normal function. The left ventricle has no regional wall motion abnormalities. There is mild left ventricular hypertrophy. Left ventricular diastolic parameters are consistent with Grade I diastolic dysfunction  (impaired relaxation).  2. Right ventricular systolic function is moderately reduced. The right ventricular size is normal. There is normal pulmonary artery systolic pressure. The estimated right ventricular systolic pressure is 22.6 mmHg. Abnormal RV, would consider PE.  3. Right atrial size was mildly dilated.  4. The mitral valve is normal in structure. No evidence of mitral valve regurgitation. No evidence of mitral stenosis.  5. The aortic valve is tricuspid. Aortic valve regurgitation is trivial. Mild aortic valve sclerosis is present, with no evidence of aortic valve stenosis.  6. The inferior vena cava is normal in size with greater than 50% respiratory variability, suggesting right atrial pressure of 3 mmHg.  CT -  1. No acute intracranial abnormality. 2. Age related atrophy and chronic small vessel ischemia. 3. Opacification of the lower right greater than left mastoid air cells likely mastoid effusion, progressed from 2017. Small fluid levels in the left and right side of the sphenoid sinus.  PUD Prophylaxis : PPI  Disposition Plan  :    Status is: Inpatient  Remains inpatient appropriate because:IV treatments appropriate due to intensity of illness or inability to take PO   Dispo: The patient is from: Home              Anticipated d/c is to: SNF              Anticipated d/c date is: > 3 days              Patient currently is not medically stable to d/c.  DVT Prophylaxis  :  Lovenox   Lab Results  Component Value Date   PLT 193 07/08/2020    Diet :  Diet Order            Diet NPO time specified Except for: Sips with Meds  Diet effective now  Inpatient Medications  Scheduled Meds: . allopurinol  300 mg Per Tube QHS  . amLODipine  10 mg Per Tube Daily  . chlorhexidine gluconate (MEDLINE KIT)  15 mL Mouth Rinse BID  . Chlorhexidine Gluconate Cloth  6 each Topical Daily  . enoxaparin (LOVENOX) injection  100 mg Subcutaneous Q12H  . folic acid  1 mg  Per Tube Daily  . insulin aspart  0-6 Units Subcutaneous Q4H  . mouth rinse  15 mL Mouth Rinse 10 times per day  . metoprolol tartrate  12.5 mg Per Tube BID  . multivitamin with minerals  1 tablet Per Tube Daily  . nitroGLYCERIN  1 inch Topical Q6H  . pantoprazole sodium  40 mg Per Tube BID  . scopolamine  1 patch Transdermal Q72H  . sodium chloride flush  10-40 mL Intracatheter Q12H  . sodium chloride flush  3 mL Intravenous Q12H  . thiamine  100 mg Per Tube Daily   Continuous Infusions: . ampicillin (OMNIPEN) IV Stopped (07/08/20 0553)  . cefTRIAXone (ROCEPHIN)  IV Stopped (07/07/20 2133)  . feeding supplement (VITAL 1.5 CAL) 45 mL/hr at 07/08/20 0400   PRN Meds:.acetaminophen, guaiFENesin-dextromethorphan, metoprolol tartrate, [DISCONTINUED] ondansetron **OR** ondansetron (ZOFRAN) IV, polyethylene glycol, sodium chloride flush, white petrolatum  Antibiotics  :    Anti-infectives (From admission, onward)   Start     Dose/Rate Route Frequency Ordered Stop   07/01/20 1200  ampicillin (OMNIPEN) 2 g in sodium chloride 0.9 % 100 mL IVPB        2 g 300 mL/hr over 20 Minutes Intravenous Every 4 hours 07/01/20 0955     06/29/20 1201  Ampicillin-Sulbactam (UNASYN) 3 g in sodium chloride 0.9 % 100 mL IVPB  Status:  Discontinued        3 g 200 mL/hr over 30 Minutes Intravenous Every 6 hours 06/29/20 0959 07/01/20 0955   06/29/20 1045  cefTRIAXone (ROCEPHIN) 2 g in sodium chloride 0.9 % 100 mL IVPB        2 g 200 mL/hr over 30 Minutes Intravenous Every 12 hours 06/29/20 0959     06/29/20 1030  cefTRIAXone (ROCEPHIN) 2 g in sodium chloride 0.9 % 100 mL IVPB  Status:  Discontinued        2 g 200 mL/hr over 30 Minutes Intravenous Every 24 hours 06/29/20 0944 06/29/20 0959   06/29/20 0500  vancomycin (VANCOREADY) IVPB 750 mg/150 mL  Status:  Discontinued        750 mg 150 mL/hr over 60 Minutes Intravenous Every 12 hours 06/28/20 1441 06/29/20 0442   06/28/20 2030  fluconazole (DIFLUCAN) IVPB  100 mg  Status:  Discontinued        100 mg 50 mL/hr over 60 Minutes Intravenous Every 24 hours 06/28/20 1906 07/01/20 1538   06/28/20 2030  Ampicillin-Sulbactam (UNASYN) 3 g in sodium chloride 0.9 % 100 mL IVPB  Status:  Discontinued        3 g 200 mL/hr over 30 Minutes Intravenous Every 8 hours 06/28/20 1940 06/29/20 0959   06/28/20 1600  acyclovir (ZOVIRAX) 110 mg in dextrose 5 % 100 mL IVPB  Status:  Discontinued        110 mg 102.2 mL/hr over 60 Minutes Intravenous Every 8 hours 06/28/20 1441 06/28/20 1448   06/28/20 1600  acyclovir (ZOVIRAX) 900 mg in dextrose 5 % 150 mL IVPB  Status:  Discontinued        900 mg 168 mL/hr over 60 Minutes Intravenous Every 8 hours 06/28/20  1450 06/28/20 1912   06/28/20 1530  vancomycin (VANCOREADY) IVPB 1500 mg/300 mL        1,500 mg 150 mL/hr over 120 Minutes Intravenous  Once 06/28/20 1441 06/28/20 2037   06/27/20 1400  ampicillin (OMNIPEN) 2 g in sodium chloride 0.9 % 100 mL IVPB  Status:  Discontinued        2 g 300 mL/hr over 20 Minutes Intravenous Every 4 hours 06/27/20 1216 06/28/20 1437   06/25/20 1947  Ampicillin-Sulbactam (UNASYN) 3 g in sodium chloride 0.9 % 100 mL IVPB  Status:  Discontinued        3 g 200 mL/hr over 30 Minutes Intravenous Every 6 hours 06/25/20 1419 06/27/20 1218   06/24/20 2200  cefTRIAXone (ROCEPHIN) 2 g in sodium chloride 0.9 % 100 mL IVPB  Status:  Discontinued        2 g 200 mL/hr over 30 Minutes Intravenous Every 12 hours 06/24/20 1513 06/28/20 1437   06/24/20 1230  cefTRIAXone (ROCEPHIN) 2 g in sodium chloride 0.9 % 100 mL IVPB  Status:  Discontinued        2 g 200 mL/hr over 30 Minutes Intravenous Every 24 hours 06/24/20 1139 06/24/20 1513   06/23/20 1430  azithromycin (ZITHROMAX) 500 mg in sodium chloride 0.9 % 250 mL IVPB  Status:  Discontinued        500 mg 250 mL/hr over 60 Minutes Intravenous Every 24 hours 06/22/20 1406 06/22/20 1410   06/23/20 1300  Ampicillin-Sulbactam (UNASYN) 3 g in sodium chloride  0.9 % 100 mL IVPB  Status:  Discontinued        3 g 200 mL/hr over 30 Minutes Intravenous Every 8 hours 06/23/20 1131 06/25/20 1419   06/23/20 1000  remdesivir 100 mg in sodium chloride 0.9 % 100 mL IVPB        100 mg 200 mL/hr over 30 Minutes Intravenous Daily 06/22/20 1345 06/26/20 1200   06/23/20 0945  ampicillin (OMNIPEN) 2 g in sodium chloride 0.9 % 100 mL IVPB  Status:  Discontinued        2 g 300 mL/hr over 20 Minutes Intravenous Every 4 hours 06/23/20 0852 06/23/20 1131   06/22/20 1430  azithromycin (ZITHROMAX) 500 mg in sodium chloride 0.9 % 250 mL IVPB  Status:  Discontinued        500 mg 250 mL/hr over 60 Minutes Intravenous Every 24 hours 06/22/20 1410 06/23/20 0902   06/22/20 1400  remdesivir 100 mg in sodium chloride 0.9 % 100 mL IVPB        100 mg 200 mL/hr over 30 Minutes Intravenous Every 30 min 06/22/20 1345 06/22/20 1518   06/22/20 1400  cefTRIAXone (ROCEPHIN) 2 g in sodium chloride 0.9 % 100 mL IVPB  Status:  Discontinued        2 g 200 mL/hr over 30 Minutes Intravenous Every 24 hours 06/22/20 1357 06/23/20 0902   06/22/20 1400  azithromycin (ZITHROMAX) 500 mg in sodium chloride 0.9 % 250 mL IVPB  Status:  Discontinued        500 mg 250 mL/hr over 60 Minutes Intravenous Every 24 hours 06/22/20 1357 06/22/20 1406       Time Spent in minutes  30   Lala Lund M.D on 07/08/2020 at 8:36 AM  To page go to [email or web address] - password [facility]  [facility] -  Office  [phone]   See all Orders from today for further details    Objective:   Vitals:   07/08/20 0600  07/08/20 0627 07/08/20 0630 07/08/20 0806  BP:  (!) 147/60    Pulse: 86  92   Resp: (!) 24  (!) 22   Temp:    98.8 F (37.1 C)  TempSrc:    Oral  SpO2: 90%  95%   Weight:      Height:        Wt Readings from Last 3 Encounters:  07/08/20 108.2 kg  03/14/20 110.3 kg  09/17/19 106.6 kg     Intake/Output Summary (Last 24 hours) at 07/08/2020 0836 Last data filed at 07/08/2020  0600 Gross per 24 hour  Intake 1440 ml  Output 2725 ml  Net -1285 ml     Physical Exam  Awake but appears extremely weak, NG tube in place, with Scopolamine patch oral secretions have reduced and so is gurgling, still drooling from the mouth and likely unable to control his oral secretions [place].AT,PERRAL Supple Neck,No JVD, No cervical lymphadenopathy appriciated.  Symmetrical Chest wall movement, coarse crackles at both bases RRR,No Gallops, Rubs or new Murmurs, No Parasternal Heave +ve B.Sounds, Abd Soft, No tenderness, No organomegaly appriciated, No rebound - guarding or rigidity. No Cyanosis, Clubbing or edema, No new Rash or bruise     Data Review:    CBC Recent Labs  Lab 07/04/20 0513 07/05/20 0500 07/06/20 0500 07/07/20 0515 07/08/20 0407  WBC 8.7 9.4 11.0* 10.6* 9.1  HGB 8.3* 7.9* 9.1* 8.5* 7.9*  HCT 26.3* 24.6* 28.6* 26.8* 25.6*  PLT 146* 135* 169 177 193  MCV 108.2* 107.4* 105.9* 106.3* 107.6*  MCH 34.2* 34.5* 33.7 33.7 33.2  MCHC 31.6 32.1 31.8 31.7 30.9  RDW 15.4 15.3 15.7* 15.4 15.5  LYMPHSABS 0.7  --   --   --  1.1  MONOABS 0.8  --   --   --  0.6  EOSABS 0.3  --   --   --  0.4  BASOSABS 0.0  --   --   --  0.0    Recent Labs  Lab 07/03/20 0339 07/03/20 0339 07/04/20 0513 07/05/20 0500 07/06/20 0500 07/07/20 0515 07/07/20 0711 07/08/20 0407 07/08/20 0408  NA 143   < > 141 142 142 142  --  142  --   K 4.5   < > 4.7 3.8 4.2 4.4  --  4.4  --   CL 111   < > 108 105 101 104  --  104  --   CO2 26   < > _0 --  27  --   GLUCOSE 131*   < > 151* 139* 100* 100*  --  110*  --   BUN 14   < > _1 --  14  --   CREATININE 0.99   < > 0.81 0.82 0.87 0.92  --  0.94  --   CALCIUM 8.9   < > 8.5* 8.8* 9.6 9.6  --  9.7  --   AST  --   --   --   --   --   --   --  46*  --   ALT  --   --   --   --   --   --   --  32  --   ALKPHOS  --   --   --   --   --   --   --  64  --   BILITOT  --   --   --   --   --   --   --  0.5  --   ALBUMIN  --   --    --   --   --   --   --  2.0*  --   MG 1.8  --  1.3* 1.6* 1.6* 1.8  --   --   --   CRP  --   --   --   --   --   --   --  2.5*  --   DDIMER  --   --   --   --   --   --   --  1.86*  --   PROCALCITON  --   --   --   --   --  0.30  --  0.27  --   BNP  --   --   --   --   --   --  464.9*  --  255.3*   < > = values in this interval not displayed.    ------------------------------------------------------------------------------------------------------------------ No results for input(s): CHOL, HDL, LDLCALC, TRIG, CHOLHDL, LDLDIRECT in the last 72 hours.  Lab Results  Component Value Date   HGBA1C 5.9 (H) 06/29/2020   ------------------------------------------------------------------------------------------------------------------ No results for input(s): TSH, T4TOTAL, T3FREE, THYROIDAB in the last 72 hours.  Invalid input(s): FREET3  Cardiac Enzymes No results for input(s): CKMB, TROPONINI, MYOGLOBIN in the last 168 hours.  Invalid input(s): CK ------------------------------------------------------------------------------------------------------------------    Component Value Date/Time   BNP 255.3 (H) 07/08/2020 0408    Micro Results Recent Results (from the past 240 hour(s))  Culture, respiratory (non-expectorated)     Status: None   Collection Time: 06/29/20 11:37 AM   Specimen: Tracheal Aspirate; Respiratory  Result Value Ref Range Status   Specimen Description TRACHEAL ASPIRATE  Final   Special Requests NONE  Final   Gram Stain   Final    FEW WBC PRESENT,BOTH PMN AND MONONUCLEAR RARE BUDDING YEAST SEEN Performed at [facility], [place] [address], [place], [place] [address]    Culture FEW CANDIDA ALBICANS  Final   Report Status 07/01/2020 FINAL  Final    Radiology Reports DG Abd 1 View  Result Date: 06/29/2020 CLINICAL DATA:  OG tube placement. EXAM: ABDOMEN - 1 VIEW COMPARISON:  Abdominal radiographs June 28, 2020, chest radiograph June 27, 2020 FINDINGS:  Orogastric tube courses below the diaphragm with the tip projecting in the expected region of the stomach. Side port is below the gastroesophageal junction. Partially imaged airspace opacities in the lung bases, better characterized on recent chest radiograph. Partially imaged scattered retained contrast within colonic diverticula in the upper abdomen. IMPRESSION: 1. Orogastric tube courses below the diaphragm with the tip projecting in the expected region of the stomach. 2. Bibasilar airspace opacities. Electronically Signed   By: Margaretha Sheffield MD   On: 06/29/2020 11:21   CT HEAD WO CONTRAST  Result Date: 06/30/2020 CLINICAL DATA:  Unresponsive since respiratory arrest yesterday EXAM: CT HEAD WITHOUT CONTRAST TECHNIQUE: Contiguous axial images were obtained from the base of the skull through the vertex without intravenous contrast. COMPARISON:  CT 06/22/2020 FINDINGS: Brain: No evidence of acute infarction, hemorrhage, hydrocephalus, extra-axial collection, visible mass lesion or mass effect. Symmetric prominence of the ventricles, cisterns and sulci compatible with parenchymal volume loss. Mixed patchy and confluent areas of white matter hypoattenuation are most compatible with chronic microvascular angiopathy. Vascular: Atherosclerotic calcification of the carotid siphons and intradural vertebral arteries. No hyperdense vessel. Skull: No scalp swelling or hematoma. No calvarial fracture or suspicious osseous lesion.  Sinuses/Orbits: Minimal thickening in the ethmoids and maxillary sinuses with pneumatized secretions and layering air-fluid levels in the sphenoid sinuses possibly related to instrumentation. Bilateral mastoid effusions are present as well. Middle ear cavities are clear. Orbital structures are unremarkable aside from prior lens extractions. Other: None. IMPRESSION: 1. No CT evidence of acute large territory or cortically based infarct nor other acute intracranial abnormality. If there is  persisting concern for acute infarction, MRI is more sensitive and specific for early features of ischemia. 2. Moderate parenchymal volume loss and chronic microvascular angiopathy. 3. Mural thickening in the paranasal sinuses with layering fluid in the sphenoid sinuses and bilateral mastoid effusions. Possibly related to instrumentation/intubation. Electronically Signed   By: Lovena Le M.D.   On: 06/30/2020 01:44   CT HEAD WO CONTRAST  Result Date: 06/22/2020 CLINICAL DATA:  Delirium. EXAM: CT HEAD WITHOUT CONTRAST TECHNIQUE: Contiguous axial images were obtained from the base of the skull through the vertex without intravenous contrast. COMPARISON:  Head CT 05/14/2016 FINDINGS: Brain: No intracranial hemorrhage, mass effect, or midline shift. Age related atrophy. No hydrocephalus. The basilar cisterns are patent. Mild to moderate chronic small vessel ischemia with slight progression from 2017. No evidence of territorial infarct or acute ischemia. No extra-axial or intracranial fluid collection. Vascular: Atherosclerosis of skullbase vasculature without hyperdense vessel or abnormal calcification. Skull: No fracture or focal lesion. Sinuses/Orbits: Opacification of the lower right greater than left mastoid air cells, progressed from 2017. Small fluid levels in the left and right side of sphenoid sinus. No acute orbital abnormality. Bilateral cataract resection. Other: None. IMPRESSION: 1. No acute intracranial abnormality. 2. Age related atrophy and chronic small vessel ischemia. 3. Opacification of the lower right greater than left mastoid air cells likely mastoid effusion, progressed from 2017. Small fluid levels in the left and right side of the sphenoid sinus. Electronically Signed   By: Keith Rake M.D.   On: 06/22/2020 22:36   DG Chest Port 1 View  Result Date: 07/07/2020 CLINICAL DATA:  Shortness of breath.  COVID-19 positive EXAM: PORTABLE CHEST 1 VIEW COMPARISON:  July 05, 2020.  FINDINGS: Feeding tube tip is below the diaphragm. Endotracheal tube no longer present. Central catheter tip is in the superior vena cava. No pneumothorax. Areas of ill-defined opacity present in the mid and lower lung regions with somewhat less opacity bilaterally compared to 2 days prior. Stable apical pleural thickening. Stable cardiac prominence with pulmonary vascularity normal. Sizable hiatal type hernia present. IMPRESSION: Patchy airspace opacity bilaterally, slightly less compared to 2 days prior. Stable cardiomegaly. Sizable hiatal type hernia. Feeding tube tip below diaphragm. Electronically Signed   By: Lowella Grip III M.D.   On: 07/07/2020 08:04   DG CHEST PORT 1 VIEW  Result Date: 07/05/2020 CLINICAL DATA:  Pneumonia EXAM: PORTABLE CHEST 1 VIEW COMPARISON:  Six days ago FINDINGS: Endotracheal tube with tip halfway between the clavicular heads and carina. Feeding tube at least reaches the stomach. Left PICC with tip at the SVC. Patchy bilateral pneumonia with progression the left base. No visible effusion or pneumothorax. Cardiomegaly. IMPRESSION: 1. Unremarkable hardware. 2. Multifocal pneumonia with progression at the left base. Electronically Signed   By: Monte Fantasia M.D.   On: 07/05/2020 06:14   DG CHEST PORT 1 VIEW  Result Date: 06/29/2020 CLINICAL DATA:  Intubation EXAM: PORTABLE CHEST 1 VIEW COMPARISON:  Two days ago FINDINGS: Endotracheal tube with tip between the clavicular heads and carina. Enteric tube with side port seen over the stomach. Left  PICC with tip near the SVC brachiocephalic junction. Patchy bilateral infiltrate. Cardiopericardial enlargement accentuated by rightward rotation. No visible effusion or pneumothorax. IMPRESSION: 1. Intubation with unremarkable hardware. 2. Atypical pneumonia with mild progression from 2 days ago. Electronically Signed   By: Monte Fantasia M.D.   On: 06/29/2020 05:07   DG CHEST PORT 1 VIEW  Result Date: 06/27/2020 CLINICAL DATA:   PICC in place EXAM: PORTABLE CHEST 1 VIEW COMPARISON:  06/27/2020 FINDINGS: Left arm PICC terminates in the mid SVC. Subpleural patchy opacities in the lungs bilaterally, left lower lobe predominant, compatible with pneumonia in this patient with known COVID. No pleural effusion or pneumothorax. The heart is top-normal in size. IMPRESSION: Left arm PICC terminates in the mid SVC. Multifocal pneumonia in this patient with known COVID. Electronically Signed   By: Julian Hy M.D.   On: 06/27/2020 09:35   DG Chest Port 1 View  Result Date: 06/27/2020 CLINICAL DATA:  Shortness of breath, COVID-19 positive. EXAM: PORTABLE CHEST 1 VIEW COMPARISON:  June 23, 2020. FINDINGS: Stable cardiomegaly. Stable multiple airspace opacities are noted bilaterally consistent with multifocal pneumonia. No pneumothorax or pleural effusion is noted. Bony thorax is unremarkable. IMPRESSION: Stable bilateral multifocal pneumonia. Electronically Signed   By: Marijo Conception M.D.   On: 06/27/2020 08:07   DG Chest Port 1 View  Result Date: 06/23/2020 CLINICAL DATA:  Shortness of breath and COVID. EXAM: PORTABLE CHEST 1 VIEW COMPARISON:  06/22/2020 FINDINGS: 0846 hours. Low lung volumes. Progressive patchy ground-glass and consolidative airspace opacity in the lower lungs. The cardio pericardial silhouette is enlarged. No substantial pleural effusion. The visualized bony structures of the thorax show no acute abnormality. Telemetry leads overlie the chest. IMPRESSION: Progressive bibasilar airspace disease consistent with multifocal pneumonia. Electronically Signed   By: Misty Stanley M.D.   On: 06/23/2020 09:04   DG Chest Port 1 View  Result Date: 06/22/2020 CLINICAL DATA:  COVID, altered mental status. EXAM: PORTABLE CHEST 1 VIEW COMPARISON:  10/19/2014. FINDINGS: Low lung volumes. Subtle airspace opacities in the peripheral lung bases. No visible pleural effusions or pneumothorax. Cardiomediastinal silhouette is within  normal limits. No evidence of acute osseous abnormality. IMPRESSION: Subtle airspace opacities in the peripheral lung bases, concerning for multifocal pneumonia and consistent with reported COVID diagnosis. Electronically Signed   By: Margaretha Sheffield MD   On: 06/22/2020 11:39   DG Abd Portable 1V  Result Date: 06/28/2020 CLINICAL DATA:  Nausea, vomiting blood, COVID-19, atrial fibrillation, coronary artery disease, hypertension, former smoker EXAM: PORTABLE ABDOMEN - 1 VIEW COMPARISON:  Portable exam 1013 hours compared to CT abdomen and pelvis of 06/20/2018 FINDINGS: Scattered retained contrast in colonic diverticula and rectum. Nonobstructive bowel gas pattern. No bowel dilatation or bowel wall thickening. Bones demineralized with degenerative changes lumbar spine. IMPRESSION: Colonic diverticulosis. Electronically Signed   By: Lavonia Dana M.D.   On: 06/28/2020 10:25   DG Swallowing Func-Speech Pathology  Result Date: 06/23/2020 Objective Swallowing Evaluation: Type of Study: MBS-Modified Barium Swallow Study  Patient Details Name: JAICION LAURIE MRN: [id number] Date of Birth: 02/18/1941 Today's Date: 06/23/2020 Time: SLP Start Time (ACUTE ONLY): 1417 -SLP Stop Time (ACUTE ONLY): 4695 SLP Time Calculation (min) (ACUTE ONLY): 20 min Past Medical History: Past Medical History: Diagnosis Date . Arthritis  . Atrial fibrillation ([place]) 01/31/2015 . Chronic gout  . Coronary artery disease 12/2007  STATUS POST STENTING OF THE LEFT ANTERIOR DESCENDING ARTERY . COVID-19  . Dyslipidemia  . Gout  . Hiatal hernia  .  Hyperlipidemia  . Hypertension  . LBP (low back pain)  . Partial small bowel obstruction ([place])  . RBBB (right bundle branch block)  . SBO (small bowel obstruction) ([facility])   PARTIAL . Second degree AV block, Mobitz type I  . Urinary tract infection   Escherichia coli urinary tract infection Past Surgical History: Past Surgical History: Procedure Laterality Date . CARDIAC CATHETERIZATION   . HERNIA REPAIR     INGUINAL . ROTATOR CUFF REPAIR   HPI: 79 yo male presenting to ED with generalized weakness, poor food intake, and confusion. COVID positive. PMH including hiatal hernia, arthritis, A-fib, HTN, AKI, alcohol abuse, and CAD with s/p stenting of L anterior descending artery. CXR Progressive bibasilar airspace disease consistent with multifocal pneumonia.  No data recorded Assessment / Plan / Recommendation CHL IP CLINICAL IMPRESSIONS 06/23/2020 Clinical Impression Pt exhibits mild oropharyngeal dysphagia with aspiration of nectar consistency, and mild vallecular residual. 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 Pt unable to contain entire bolus in oral cavity and moderate amount nectar thick fell over tongue and was aspirated prior to initiation of laryngeal closure (frank volume) with immediate cough. Timing of closure delayed with thin using chin tuck and slight aspiraiton present. Lingual residue present falling into valleculae post swallow. Chin down posture using straw was effective with nectar however pt displays memory deficit and follow through of strategy is questionable. 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 Recommend honey thick liquids, Dys 3, crush meds. full supervision/assist. SLP will work with pt on chin tuck with nectar for possible upgrade.  SLP Visit Diagnosis Dysphagia, oropharyngeal phase (R13.12) Attention and concentration deficit following -- Frontal lobe and executive function deficit following -- Impact on safety and function Mild aspiration risk;Moderate aspiration risk   CHL IP TREATMENT RECOMMENDATION 06/23/2020 Treatment Recommendations Therapy as outlined in treatment plan below   Prognosis 06/23/2020 Prognosis for Safe Diet Advancement (No Data) Barriers to Reach Goals Cognitive deficits Barriers/Prognosis Comment -- CHL IP DIET RECOMMENDATION 06/23/2020 SLP  Diet Recommendations Dysphagia 3 (Mech soft) solids;Honey thick liquids Liquid Administration via Cup Medication Administration Crushed with puree Compensations Minimize environmental distractions;Slow rate;Small sips/bites Postural Changes Seated upright at 90 degrees;Remain semi-upright after after feeds/meals (Comment)   CHL IP OTHER RECOMMENDATIONS 06/23/2020 Recommended Consults -- Oral Care Recommendations Oral care BID Other Recommendations --   CHL IP FOLLOW UP RECOMMENDATIONS 06/23/2020 Follow up Recommendations (No Data)   CHL IP FREQUENCY AND DURATION 06/23/2020 Speech Therapy Frequency (ACUTE ONLY) min 2x/week Treatment Duration 2 weeks      CHL IP ORAL PHASE 06/23/2020 Oral Phase Impaired Oral - Pudding Teaspoon -- Oral - Pudding Cup -- Oral - Honey Teaspoon -- Oral - Honey Cup Lingual/palatal residue;Piecemeal swallowing Oral - Nectar Teaspoon -- Oral - Nectar Cup Decreased bolus cohesion Oral - Nectar Straw Decreased bolus cohesion Oral - Thin Teaspoon -- Oral - Thin Cup -- Oral - Thin Straw -- Oral - Puree Piecemeal swallowing Oral - Mech Soft -- Oral - Regular -- Oral - Multi-Consistency -- Oral - Pill -- Oral Phase - Comment --  CHL IP PHARYNGEAL PHASE 06/23/2020 Pharyngeal Phase Impaired Pharyngeal- Pudding Teaspoon -- Pharyngeal -- Pharyngeal- Pudding Cup -- Pharyngeal -- Pharyngeal- Honey Teaspoon -- Pharyngeal -- Pharyngeal- Honey Cup Pharyngeal residue - valleculae Pharyngeal -- Pharyngeal- Nectar Teaspoon -- Pharyngeal -- Pharyngeal- Nectar Cup Penetration/Aspiration before swallow Pharyngeal Material enters airway, passes BELOW cords and not ejected  out despite cough attempt by patient Pharyngeal- Nectar Straw Penetration/Aspiration during swallow Pharyngeal Material enters airway, remains ABOVE vocal cords then ejected out;Material does not enter airway Pharyngeal- Thin Teaspoon NT Pharyngeal -- Pharyngeal- Thin Cup NT Pharyngeal -- Pharyngeal- Thin Straw Penetration/Aspiration during  swallow Pharyngeal Material enters airway, passes BELOW cords without attempt by patient to eject out (silent aspiration) Pharyngeal- Puree Pharyngeal residue - valleculae Pharyngeal -- Pharyngeal- Mechanical Soft -- Pharyngeal -- Pharyngeal- Regular WFL Pharyngeal -- Pharyngeal- Multi-consistency -- Pharyngeal -- Pharyngeal- Pill -- Pharyngeal -- Pharyngeal Comment --  CHL IP CERVICAL ESOPHAGEAL PHASE 06/23/2020 Cervical Esophageal Phase WFL Pudding Teaspoon -- Pudding Cup -- Honey Teaspoon -- Honey Cup -- Nectar Teaspoon -- Nectar Cup -- Nectar Straw -- Thin Teaspoon -- Thin Cup -- Thin Straw -- Puree -- Mechanical Soft -- Regular -- Multi-consistency -- Pill -- Cervical Esophageal Comment -- [place] Siren 06/23/2020, 4:04 PM Orbie Pyo Colvin Caroli.Ed Actor Pager [phone] Office [phone]              ECHOCARDIOGRAM COMPLETE  Result Date: 06/23/2020    ECHOCARDIOGRAM REPORT   Patient Name:   TIWAN SCHNITKER Date of Exam: 06/23/2020 Medical Rec #:  [id number]      Height:       71.0 in Accession #:    [id number]     Weight:       244.7 lb Date of Birth:  07-08-1941      BSA:          2.297 m Patient Age:    53 years       BP:           170/76 mmHg Patient Gender: M              HR:           71 bpm. Exam Location:  Inpatient Procedure: 2D Echo, Cardiac Doppler and Color Doppler Indications:    R07.9* Chest pain, unspecified. Elevated troponin  History:        Patient has prior history of Echocardiogram examinations, most                 recent 02/24/2019. CAD, Abnormal ECG, Arrythmias:RBBB,                 Signs/Symptoms:Hypotension; Risk Factors:Dyslipidemia. Covid 19                 positive. ETOH.  Sonographer:    Roseanna Rainbow RDCS Referring Phys: [place]  Sonographer Comments: Patient in Fowlaer's position. IMPRESSIONS  1. Left ventricular ejection fraction, by estimation, is 60 to 65%. The left ventricle has normal function. The left ventricle has no regional  wall motion abnormalities. There is mild left ventricular hypertrophy. Left ventricular diastolic parameters are consistent with Grade I diastolic dysfunction (impaired relaxation).  2. Right ventricular systolic function is moderately reduced. The right ventricular size is normal. There is normal pulmonary artery systolic pressure. The estimated right ventricular systolic pressure is 46.9 mmHg. Abnormal RV, would consider PE.  3. Right atrial size was mildly dilated.  4. The mitral valve is normal in structure. No evidence of mitral valve regurgitation. No evidence of mitral stenosis.  5. The aortic valve is tricuspid. Aortic valve regurgitation is trivial. Mild aortic valve sclerosis is present, with no evidence of aortic valve stenosis.  6. The inferior vena cava is normal in size with greater than 50% respiratory variability, suggesting right atrial pressure of 3 mmHg. FINDINGS  Left Ventricle: Left ventricular ejection fraction, by estimation, is 60 to 65%. The left ventricle has normal function. The left ventricle has no regional wall motion abnormalities. The left ventricular internal cavity size was normal in size. There is  mild left ventricular hypertrophy. Left ventricular diastolic parameters are consistent with Grade I diastolic dysfunction (impaired relaxation). Right Ventricle: The right ventricular size is normal. No increase in right ventricular wall thickness. Right ventricular systolic function is moderately reduced. There is normal pulmonary artery systolic pressure. The tricuspid regurgitant velocity is 2.75 m/s, and with an assumed right atrial pressure of 3 mmHg, the estimated right ventricular systolic pressure is 26.7 mmHg. Left Atrium: Left atrial size was normal in size. Right Atrium: Right atrial size was mildly dilated. Pericardium: There is no evidence of pericardial effusion. Mitral Valve: The mitral valve is normal in structure. No evidence of mitral valve regurgitation. No evidence  of mitral valve stenosis. Tricuspid Valve: The tricuspid valve is normal in structure. Tricuspid valve regurgitation is trivial. Aortic Valve: The aortic valve is tricuspid. Aortic valve regurgitation is trivial. Mild aortic valve sclerosis is present, with no evidence of aortic valve stenosis. Pulmonic Valve: The pulmonic valve was normal in structure. Pulmonic valve regurgitation is not visualized. Aorta: The aortic root is normal in size and structure. Venous: The inferior vena cava is normal in size with greater than 50% respiratory variability, suggesting right atrial pressure of 3 mmHg. IAS/Shunts: No atrial level shunt detected by color flow Doppler.  LEFT VENTRICLE PLAX 2D LVIDd:         3.80 cm     Diastology LVIDs:         2.60 cm     LV e' medial:    4.35 cm/s LV PW:         1.70 cm     LV E/e' medial:  16.9 LV IVS:        1.50 cm     LV e' lateral:   5.33 cm/s LVOT diam:     2.30 cm     LV E/e' lateral: 13.8 LV SV:         97 LV SV Index:   42 LVOT Area:     4.15 cm  LV Volumes (MOD) LV vol d, MOD A2C: 78.7 ml LV vol d, MOD A4C: 84.2 ml LV vol s, MOD A2C: 17.5 ml LV vol s, MOD A4C: 25.2 ml LV SV MOD A2C:     61.2 ml LV SV MOD A4C:     84.2 ml LV SV MOD BP:      64.3 ml RIGHT VENTRICLE             IVC RV S prime:     18.50 cm/s  IVC diam: 2.20 cm TAPSE (M-mode): 3.2 cm LEFT ATRIUM             Index       RIGHT ATRIUM           Index LA diam:        3.70 cm 1.61 cm/m  RA Area:     18.30 cm LA Vol (A2C):   34.1 ml 14.84 ml/m RA Volume:   53.10 ml  23.11 ml/m LA Vol (A4C):   49.7 ml 21.63 ml/m LA Biplane Vol: 43.7 ml 19.02 ml/m  AORTIC VALVE LVOT Vmax:   124.00 cm/s LVOT Vmean:  89.800 cm/s LVOT VTI:    0.233 m  AORTA Ao Root diam: 3.50 cm MITRAL VALVE  TRICUSPID VALVE MV Area (PHT): 3.21 cm    TR Peak grad:   30.2 mmHg MV Decel Time: 236 msec    TR Vmax:        275.00 cm/s MV E velocity: 73.30 cm/s MV A velocity: 99.05 cm/s  SHUNTS MV E/A ratio:  0.74        Systemic VTI:  0.23 m                             Systemic Diam: 2.30 cm Loralie Champagne MD Electronically signed by Loralie Champagne MD Signature Date/Time: 06/23/2020/4:53:51 PM    Final    [place] EKG SITE RITE  Result Date: 06/29/2020 If Site Rite image not attached, placement could not be confirmed due to current cardiac rhythm.  [place] EKG SITE RITE  Result Date: 06/25/2020 If Site Rite image not attached, placement could not be confirmed due to current cardiac rhythm.

## 2020-07-08 NOTE — Progress Notes (Signed)
Daily Progress Note   Patient Name: Gregory Wheeler       Date: 07/08/2020 DOB: Feb 05, 1941  Age: 79 y.o. MRN#: [id number] Attending Physician: Thurnell Lose, MD Primary Care Physician: [name], NP Admit Date: 06/22/2020  Reason for Consultation/Follow-up: Establishing goals of care  Subjective: Today patient sitting up in bed, alert. Secretions look to be a bit better but he is still needing suction to manage them. I called and spoke with patient's son Gregory Wheeler and daughter Gregory Wheeler. 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 Before admission Gregory Wheeler was continuing to go to work daily, living independently, and completing ADLs on his own. His wife died 2 years ago from pancreatic cancer and since then Gregory Wheeler has experienced significant depression for which he self medicated with heavy alcohol use. He had poor nutrition and did not eat very much. Gregory Wheeler's current medical situation and hospitalization was reviewed in detail with Gregory Wheeler and Gregory Wheeler. Possible trajectories were discussed. CODE STATUS was discussed- I shared with them like this conversation I had with Gregory Wheeler yesterday and let them know I plan to enter DNR status on patient based on that discussion, Gregory Wheeler and Gregory Wheeler agreed with this plan. We discussed further decisions that may need to be made, including continued aggressive medical care versus transition to full comfort measures, continuing artificial feeding. I discussed with him the concern that even with artificial feeding and continued care Gregory Wheeler is going to continue to aspirate and develop pneumonia. We discussed that he is very weak and it is unlikely he will be able to return home and independent living. He will need  antibiotics for a total of 6 weeks, and is likely to need nursing facility after discharge if continued aggressive medical care is desired. Comfort measures and hospice care were also discussed. Gregory Wheeler noted that overall goal would be ideally to take Gregory Wheeler home.   Review of Systems  Unable to perform ROS: Acuity of condition    Length of Stay: 16  Current Medications: Scheduled Meds:  . allopurinol  300 mg Per Tube QHS  . amLODipine  10 mg Per Tube Daily  . chlorhexidine gluconate (MEDLINE KIT)  15 mL Mouth Rinse BID  . Chlorhexidine Gluconate Cloth  6 each Topical Daily  . enoxaparin (  LOVENOX) injection  100 mg Subcutaneous Q12H  . folic acid  1 mg Per Tube Daily  . insulin aspart  0-6 Units Subcutaneous Q4H  . mouth rinse  15 mL Mouth Rinse 10 times per day  . metoprolol tartrate  12.5 mg Per Tube BID  . multivitamin with minerals  1 tablet Per Tube Daily  . nitroGLYCERIN  1 inch Topical Q6H  . pantoprazole sodium  40 mg Per Tube BID  . scopolamine  1 patch Transdermal Q72H  . sodium chloride flush  10-40 mL Intracatheter Q12H  . sodium chloride flush  3 mL Intravenous Q12H  . thiamine  100 mg Per Tube Daily    Continuous Infusions: . ampicillin (OMNIPEN) IV 2 g (07/08/20 1103)  . cefTRIAXone (ROCEPHIN)  IV 2 g (07/08/20 1026)  . feeding supplement (VITAL 1.5 CAL) 45 mL/hr at 07/08/20 0400    PRN Meds: acetaminophen, guaiFENesin-dextromethorphan, metoprolol tartrate, [DISCONTINUED] ondansetron **OR** ondansetron (ZOFRAN) IV, polyethylene glycol, sodium chloride flush, white petrolatum  Physical Exam Vitals and nursing note reviewed.  HENT:     Mouth/Throat:     Comments: Copious secretions Neurological:     Mental Status: He is alert.     Comments: Generalized weakness             Vital Signs: BP 132/68   Pulse (!) 101   Temp 98.5 F (36.9 C) (Oral)   Resp (!) 22   Ht '5\' 11"'  (1.803 m)   Wt 108.2 kg   SpO2 95%   BMI 33.27 kg/m  SpO2: SpO2: 95 % O2 Device:  O2 Device: Nasal Cannula O2 Flow Rate: O2 Flow Rate (L/min): 2 L/min  Intake/output summary:   Intake/Output Summary (Last 24 hours) at 07/08/2020 1229 Last data filed at 07/08/2020 0600 Gross per 24 hour  Intake 1260 ml  Output 2275 ml  Net -1015 ml   LBM: Last BM Date:  (07/07/2020) Baseline Weight: Weight: 111 kg Most recent weight: Weight: 108.2 kg       Palliative Assessment/Data: PPS: 10%     Patient Active Problem List   Diagnosis Date Noted  . Encephalopathy acute   . Pneumonia   . Advanced care planning/counseling discussion   . Goals of care, counseling/discussion   . Protein-calorie malnutrition, severe 06/30/2020  . Denture stomatitis   . Pneumonia due to COVID-19 virus 06/22/2020  . Dehydration 06/22/2020  . Acute respiratory failure due to COVID-19 ([place]) 06/22/2020  . Hypokalemia 06/22/2020  . Acute metabolic encephalopathy 29/92/4268  . 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 Long term (current) use of anticoagulants [Z79.01] 02/22/2016  . Hypotension 12/21/2015  . Fatigue 12/21/2015  . Alcohol abuse 02/04/2015  . Infected prepatellar bursa 02/03/2015  . Effusion of right knee 02/03/2015  . AKI (acute kidney injury) ([place]) 02/03/2015  . Chronic atrial fibrillation ([place]) 02/03/2015  . Knee effusion 02/03/2015  . Atrial fibrillation ([place]) 01/31/2015  . Gout 01/31/2015  . Second degree AV block, Mobitz type I 12/09/2013  . RBBB (right bundle branch block) 01/03/2011  . Hypertension   . Hyperlipidemia   . SBO (small bowel obstruction) ([place])   . LBP (low back pain)   . Arthritis   . Gout   . Coronary artery disease 12/26/2007    Palliative Care Assessment & Plan   Patient Profile: 79 y.o. male  with past medical history of coronary artery disease, gout,  heavy alcohol use, atrial fib, admitted on 06/22/2020 with mild Covid pneumonia,  aspiration pneumonia, bacteremia with Enterococcus.  Admission has been complicated by delirium tremens and upper GI bleed resulting in intubation on November 3.  He was extubated on November 9.  He is extremely weak, unable to control copious secretions.  Appears to continue to be aspirating.  Palliative medicine consulted for goals of care.  Assessment/Recommendations/Plan   DNR  Continue current scope care  Plan for FaceTime visit tomorrow at noon and continued goals of care discussion with Gregory Wheeler and Gregory Wheeler  Goals of Care and Additional Recommendations:  Limitations on Scope of Treatment: Full Scope Treatment  Code Status:  DNR  Prognosis:   Unable to determine  Discharge Planning:  To Be Determined  Care plan was discussed with patient's family.  Thank you for allowing the Palliative Medicine Team to assist in the care of this patient.   Total time: 49 mins  Greater than 50%  of this time was spent counseling and coordinating care related to the above assessment and plan.  Mariana Kaufman, AGNP-C Palliative Medicine   Please contact Palliative Medicine Team phone at [phone] for questions and concerns.

## 2020-07-08 NOTE — Progress Notes (Signed)
SLP Cancellation Note  Patient Details Name: Gregory Wheeler MRN: [id number] DOB: 1940/09/20   Cancelled treatment:     Pt not responding, continues with poor secretion management per chart review.  Palliative meeting pending today.  Will follow for GOC.  Presley Summerlin L. Samson Frederic, MA CCC/SLP Acute Rehabilitation Services Office number [phone] Pager [phone]       [name] 07/08/2020, 12:30 PM

## 2020-07-09 DIAGNOSIS — U071 COVID-19: Secondary | ICD-10-CM | POA: Diagnosis not present

## 2020-07-09 DIAGNOSIS — G9341 Metabolic encephalopathy: Secondary | ICD-10-CM | POA: Diagnosis not present

## 2020-07-09 DIAGNOSIS — Z7189 Other specified counseling: Secondary | ICD-10-CM | POA: Diagnosis not present

## 2020-07-09 DIAGNOSIS — J1282 Pneumonia due to coronavirus disease 2019: Secondary | ICD-10-CM | POA: Diagnosis not present

## 2020-07-09 DIAGNOSIS — G9340 Encephalopathy, unspecified: Secondary | ICD-10-CM | POA: Diagnosis not present

## 2020-07-09 LAB — BRAIN NATRIURETIC PEPTIDE: B Natriuretic Peptide: 344.4 pg/mL — ABNORMAL HIGH (ref 0.0–100.0)

## 2020-07-09 LAB — CBC WITH DIFFERENTIAL/PLATELET
Abs Immature Granulocytes: 0.04 10*3/uL (ref 0.00–0.07)
Basophils Absolute: 0 10*3/uL (ref 0.0–0.1)
Basophils Relative: 1 %
Eosinophils Absolute: 0.5 10*3/uL (ref 0.0–0.5)
Eosinophils Relative: 6 %
HCT: 24.8 % — ABNORMAL LOW (ref 39.0–52.0)
Hemoglobin: 7.7 g/dL — ABNORMAL LOW (ref 13.0–17.0)
Immature Granulocytes: 1 %
Lymphocytes Relative: 10 %
Lymphs Abs: 0.8 10*3/uL (ref 0.7–4.0)
MCH: 33.8 pg (ref 26.0–34.0)
MCHC: 31 g/dL (ref 30.0–36.0)
MCV: 108.8 fL — ABNORMAL HIGH (ref 80.0–100.0)
Monocytes Absolute: 0.5 10*3/uL (ref 0.1–1.0)
Monocytes Relative: 6 %
Neutro Abs: 6.1 10*3/uL (ref 1.7–7.7)
Neutrophils Relative %: 76 %
Platelets: 224 10*3/uL (ref 150–400)
RBC: 2.28 MIL/uL — ABNORMAL LOW (ref 4.22–5.81)
RDW: 15.3 % (ref 11.5–15.5)
WBC: 8 10*3/uL (ref 4.0–10.5)
nRBC: 0 % (ref 0.0–0.2)

## 2020-07-09 LAB — C-REACTIVE PROTEIN: CRP: 1.2 mg/dL — ABNORMAL HIGH (ref <1.0)

## 2020-07-09 LAB — COMPREHENSIVE METABOLIC PANEL
ALT: 31 U/L (ref 0–44)
AST: 35 U/L (ref 15–41)
Albumin: 2.1 g/dL — ABNORMAL LOW (ref 3.5–5.0)
Alkaline Phosphatase: 61 U/L (ref 38–126)
Anion gap: 10 (ref 5–15)
BUN: 13 mg/dL (ref 8–23)
CO2: 25 mmol/L (ref 22–32)
Calcium: 9.5 mg/dL (ref 8.9–10.3)
Chloride: 106 mmol/L (ref 98–111)
Creatinine, Ser: 0.88 mg/dL (ref 0.61–1.24)
GFR, Estimated: >60 mL/min (ref >60)
Glucose, Bld: 109 mg/dL — ABNORMAL HIGH (ref 70–99)
Potassium: 3.9 mmol/L (ref 3.5–5.1)
Sodium: 141 mmol/L (ref 135–145)
Total Bilirubin: 0.4 mg/dL (ref 0.3–1.2)
Total Protein: 5.4 g/dL — ABNORMAL LOW (ref 6.5–8.1)

## 2020-07-09 LAB — TYPE AND SCREEN
ABO/RH(D): AB POS
Antibody Screen: NEGATIVE

## 2020-07-09 LAB — GLUCOSE, CAPILLARY
Glucose-Capillary: 100 mg/dL — ABNORMAL HIGH (ref 70–99)
Glucose-Capillary: 100 mg/dL — ABNORMAL HIGH (ref 70–99)
Glucose-Capillary: 101 mg/dL — ABNORMAL HIGH (ref 70–99)
Glucose-Capillary: 103 mg/dL — ABNORMAL HIGH (ref 70–99)
Glucose-Capillary: 104 mg/dL — ABNORMAL HIGH (ref 70–99)
Glucose-Capillary: 91 mg/dL (ref 70–99)

## 2020-07-09 LAB — URINE CULTURE: Culture: NO GROWTH

## 2020-07-09 LAB — D-DIMER, QUANTITATIVE: D-Dimer, Quant: 1.88 ug/mL-FEU — ABNORMAL HIGH (ref 0.00–0.50)

## 2020-07-09 MED ORDER — FUROSEMIDE 10 MG/ML IJ SOLN
40.0000 mg | Freq: Once | INTRAMUSCULAR | Status: AC
  Administered 2020-07-09: 40 mg via INTRAVENOUS
  Filled 2020-07-09: qty 4

## 2020-07-09 MED ORDER — LORAZEPAM 2 MG/ML IJ SOLN
1.0000 mg | Freq: Once | INTRAMUSCULAR | Status: AC | PRN
  Administered 2020-07-12: 1 mg via INTRAVENOUS
  Filled 2020-07-09: qty 1

## 2020-07-09 MED ORDER — HALOPERIDOL LACTATE 5 MG/ML IJ SOLN: 2.0000 mg | Freq: Once | INTRAMUSCULAR | Status: DC | PRN

## 2020-07-09 NOTE — Progress Notes (Signed)
Ketamine gtt turned off this morning and with added meds patient blood pressure is much mproved

## 2020-07-09 NOTE — Progress Notes (Addendum)
Search patient room and belongings looking for wedding band (thin and gold band) per son request, no ring found, located upper and bottom partial plates and cell phone which is pack up and ready to be sent to [id number] patient new room. Son updated of findings and transfer.  Patient remains on 37M in room 13

## 2020-07-09 NOTE — Progress Notes (Signed)
Daily Progress Note   Patient Name: Gregory Wheeler       Date: 07/09/2020 DOB: 1940-11-10  Age: 79 y.o. MRN#: [id number] Attending Physician: Thurnell Lose, MD Primary Care Physician: [name], NP Admit Date: 06/22/2020  Reason for Consultation/Follow-up: Establishing goals of care  Subjective: Facilitated FaceTime visit for patient and family.  Afterwards spoke with patient's son by phone.  He shares that he spoke with the patient's doctor this morning and was encouraged by the fact that the doctor said they needed to get him home as soon as possible. Patient appears a little improved today with secretions decreased. Family would like to continue current interventions.   Length of Stay: 17  Current Medications: Scheduled Meds:  . allopurinol  300 mg Per Tube QHS  . amLODipine  10 mg Per Tube Daily  . chlorhexidine gluconate (MEDLINE KIT)  15 mL Mouth Rinse BID  . Chlorhexidine Gluconate Cloth  6 each Topical Daily  . enoxaparin (LOVENOX) injection  100 mg Subcutaneous Q12H  . folic acid  1 mg Per Tube Daily  . insulin aspart  0-6 Units Subcutaneous Q4H  . mouth rinse  15 mL Mouth Rinse 10 times per day  . metoprolol tartrate  12.5 mg Per Tube BID  . multivitamin with minerals  1 tablet Per Tube Daily  . nitroGLYCERIN  1 inch Topical Q6H  . pantoprazole sodium  40 mg Per Tube BID  . scopolamine  1 patch Transdermal Q72H  . sodium chloride flush  10-40 mL Intracatheter Q12H  . thiamine  100 mg Per Tube Daily    Continuous Infusions: . ampicillin (OMNIPEN) IV 2 g (07/09/20 0932)  . cefTRIAXone (ROCEPHIN)  IV 200 mL/hr at 07/09/20 0904  . feeding supplement (VITAL 1.5 CAL) 1,000 mL (07/09/20 0431)    PRN Meds: acetaminophen, guaiFENesin-dextromethorphan,  hydrALAZINE, [DISCONTINUED] ondansetron **OR** ondansetron (ZOFRAN) IV, polyethylene glycol, white petrolatum  Physical Exam Vitals and nursing note reviewed.  Cardiovascular:     Comments: anasarca Pulmonary:     Comments: Audible secretions, mouth breathing Neurological:     Mental Status: He is alert.             Vital Signs: BP (!) 144/70   Pulse 89   Temp 99.5 F (37.5 C) (Axillary)   Resp (!) 30  Ht _0  (1.803 m)   Wt 108.3 kg   SpO2 98%   BMI 33.30 kg/m  SpO2: SpO2: 98 % O2 Device: O2 Device: Nasal Cannula O2 Flow Rate: O2 Flow Rate (L/min): 2 L/min  Intake/output summary:   Intake/Output Summary (Last 24 hours) at 07/09/2020 1230 Last data filed at 07/09/2020 1000 Gross per 24 hour  Intake 1890.12 ml  Output 2340 ml  Net -449.88 ml   LBM: Last BM Date:  (07/07/2020) Baseline Weight: Weight: 111 kg Most recent weight: Weight: 108.3 kg       Palliative Assessment/Data: PPS: 10%      Patient Active Problem List   Diagnosis Date Noted  . Encephalopathy acute   . Pneumonia   . Advanced care planning/counseling discussion   . Goals of care, counseling/discussion   . Protein-calorie malnutrition, severe 06/30/2020  . Denture stomatitis   . Pneumonia due to COVID-19 virus 06/22/2020  . Dehydration 06/22/2020  . Acute respiratory failure due to COVID-19 ([place]) 06/22/2020  . Hypokalemia 06/22/2020  . Acute metabolic encephalopathy 42/59/5638  . 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 Blood clotting disorder ([place]) 03/14/2020  . Long term (current) use of anticoagulants [Z79.01] 02/22/2016  . Hypotension 12/21/2015  . Fatigue 12/21/2015  . Alcohol abuse 02/04/2015  . Infected prepatellar bursa 02/03/2015  . Effusion of right knee 02/03/2015  . AKI (acute kidney injury) ([place]) 02/03/2015  . Chronic atrial fibrillation ([place]) 02/03/2015  . Knee effusion 02/03/2015  . Atrial  fibrillation ([place]) 01/31/2015  . Gout 01/31/2015  . Second degree AV block, Mobitz type I 12/09/2013  . RBBB (right bundle branch block) 01/03/2011  . Hypertension   . Hyperlipidemia   . SBO (small bowel obstruction) ([place])   . LBP (low back pain)   . Arthritis   . Gout   . Coronary artery disease 12/26/2007    Palliative Care Assessment & Plan   Patient Profile: 79 y.o.malewith past medical history of coronary artery disease, gout, heavy alcohol use, atrial fib,admitted on 10/27/2021with mild Covid pneumonia, aspiration pneumonia, bacteremia with Enterococcus.Admission has been complicated by delirium tremens and upper GI bleed resulting in intubation on November 3. He was extubated on November 9. He is extremely weak, unable to control copious secretions. Appears to continue to be aspirating. Palliative medicine consulted for goals of care.  Assessment/Recommendations/Plan   Continue current plan of care  Current GOC are to improve patient to where he can return home  Goals of Care and Additional Recommendations:  Limitations on Scope of Treatment: Full Scope Treatment  Code Status:  DNR  Prognosis:   Unable to determine  Discharge Planning:  To Be Determined  Care plan was discussed with patient's family and nurse.   Thank you for allowing the Palliative Medicine Team to assist in the care of this patient.   Total time: 41 mins Greater than 50%  of this time was spent counseling and coordinating care related to the above assessment and plan.  Mariana Kaufman, AGNP-C Palliative Medicine   Please contact Palliative Medicine Team phone at [phone] for questions and concerns.

## 2020-07-09 NOTE — Evaluation (Signed)
Clinical/Bedside Swallow Evaluation Patient Details  Name: Gregory Wheeler MRN: [id number] Date of Birth: 1941-03-12  Today's Date: 07/09/2020 Time: SLP Start Time (ACUTE ONLY): 1450 SLP Stop Time (ACUTE ONLY): 1505 SLP Time Calculation (min) (ACUTE ONLY): 15 min  Past Medical History:  Past Medical History:  Diagnosis Date  . Arthritis   . Atrial fibrillation ([facility]) 01/31/2015  . Chronic gout   . Coronary artery disease 12/2007   STATUS POST STENTING OF THE LEFT ANTERIOR DESCENDING ARTERY  . COVID-19   . Dyslipidemia   . Gout   . Hiatal hernia   . Hyperlipidemia   . Hypertension   . LBP (low back pain)   . Partial small bowel obstruction ([facility])   . RBBB (right bundle branch block)   . SBO (small bowel obstruction) ([facility])    PARTIAL  . Second degree AV block, Mobitz type I   . Urinary tract infection    Escherichia coli urinary tract infection   Past Surgical History:  Past Surgical History:  Procedure Laterality Date  . CARDIAC CATHETERIZATION    . ESOPHAGOGASTRODUODENOSCOPY N/A 06/30/2020   Procedure: ESOPHAGOGASTRODUODENOSCOPY (EGD);  Surgeon: Vida Rigger, MD;  Location: [facility] ENDOSCOPY;  Service: Endoscopy;  Laterality: N/A;  . HERNIA REPAIR     INGUINAL  . ROTATOR CUFF REPAIR     HPI:  79 yo male presenting to ED with generalized weakness, poor food intake, and confusion. COVID positive. PMH including hiatal hernia, arthritis, A-fib, HTN, AKI, alcohol abuse, and CAD with s/p stenting of L anterior descending artery. Intubated 11/3-11/9 due to respiratory failure in setting of worsening delirium and likely aspiration event. CXR with multifocal pneumonia with progression at the left base.  Pt was intubated from  06/29/20 - 07/05/20.    Assessment / Plan / Recommendation Clinical Impression  Pt was seen for a re-evaluation post recent extubation on 07/05/20.  Pt was previously seen for a BSE and MBS on 06/23/2020 prior to intubation, with recommendations for Dysphagia 3 solids and  honey-thick liquids.  Pt appeared to have improved control of oral secretions during this evaluation in comparison to previous notes per chart review; however there is still a significant concern for aspiration of his secretions.  He was alert and cooperative throughout this evaluation.  He was noted to be significantly dysphonic upon SLP arrival.  He consumed 3 tsp of honey-thick liquid via spoon with reduced lingual manipulation, suspected prolonged AP transport, and suspected delayed swallow initiation.  Swallow was audible and a prolonged weak, congested cough was observed following the 3rd trial of honey-thick liquid.  No additional trials were attempted.  Patient is not ready for a repeat MBS at this time.  Recommend continuation of NPO with alternative means of nutrition.  SLP will continue to f/u per POC.    SLP Visit Diagnosis: Dysphagia, oropharyngeal phase (R13.12)    Aspiration Risk  Severe aspiration risk    Diet Recommendation NPO;Alternative means - temporary   Medication Administration: Via alternative means    Other  Recommendations Oral Care Recommendations: Oral care QID;Staff/trained caregiver to provide oral care Other Recommendations: Remove water pitcher;Have oral suction available   Follow up Recommendations Other (comment);Skilled Nursing facility      Frequency and Duration min 2x/week  2 weeks       Prognosis Prognosis for Safe Diet Advancement: Guarded Barriers to Reach Goals: Severity of deficits;Cognitive deficits      Swallow Study   General Date of Onset: 06/22/20 HPI: 79 yo male presenting  to ED with generalized weakness, poor food intake, and confusion. COVID positive. PMH including hiatal hernia, arthritis, A-fib, HTN, AKI, alcohol abuse, and CAD with s/p stenting of L anterior descending artery. Intubated 11/3-11/9 due to respiratory failure in setting of worsening delirium and likely aspiration event. CXR with multifocal pneumonia with progression at  the left base. Type of Study: Bedside Swallow Evaluation Previous Swallow Assessment: BSE & MBS 04/23/20 Diet Prior to this Study: NPO;NG Tube Temperature Spikes Noted: Yes Respiratory Status: Nasal cannula History of Recent Intubation: Yes Length of Intubations (days): 6 days Date extubated: 07/05/20 Behavior/Cognition: Alert;Cooperative;Pleasant mood Oral Cavity Assessment: Within Functional Limits Oral Care Completed by SLP: No Oral Cavity - Dentition: Other (Comment) (Upper and lower partials ) Vision: Functional for self-feeding Self-Feeding Abilities: Needs assist Patient Positioning: Upright in bed Baseline Vocal Quality: Low vocal intensity;Breathy Volitional Cough: Congested;Wet;Weak Volitional Swallow: Able to elicit    Oral/Motor/Sensory Function Overall Oral Motor/Sensory Function: Generalized oral weakness   Ice Chips Ice chips: Not tested   Thin Liquid Thin Liquid: Not tested    Nectar Thick Nectar Thick Liquid: Not tested   Honey Thick Honey Thick Liquid: Impaired Presentation: Spoon Oral Phase Impairments: Reduced lingual movement/coordination Oral Phase Functional Implications: Prolonged oral transit Pharyngeal Phase Impairments: Cough - Delayed   Puree Puree: Not tested   Solid     Solid: Not tested     Villa Herb M.S., CCC-SLP Acute Rehabilitation Services Office: [phone]  Shanon Rosser Dheeraj Hail 07/09/2020,3:23 PM

## 2020-07-09 NOTE — Progress Notes (Signed)
PROGRESS NOTE                                                                                                                                                                                                             Patient Demographics:    Gregory Wheeler, is a 79 y.o. male, DOB - 1941/03/22, VQX:[id number]  Outpatient Primary MD for the patient is [name], NP    LOS - 17  Admit date - 06/22/2020    Chief Complaint  Patient presents with  . Covid pos  and Not eating or drinking       Brief Narrative (HPI from H&P)   79 year old Caucasian male who was admitted for COVID-19 pneumonia despite being fully vaccinated, his COVID-37 pneumonia was actually quite limited however he went into full-blown DTs and stay was complicated by upper GI bleed for which he was seen by [facility], respiratory arrest resuscitation, critical care was consulted and he was intubated for close to a week, post extubation he is showing signs of ongoing aspiration of oral secretions, extremely frail and weak.  Now wants to be DNR.  We will continue medical treatment and monitor closely for another 24 hours in ICU.  Will involve palliative care as well.   Subjective:   In bed sitting up, NG tube in place, appears to be in no distress but overall appears extremely weak and frail, still has some gurgling in his upper airway, denies any headache chest or abdominal pain.   Assessment  & Plan :     1. Acute Hypoxic Resp. Failure due to Acute Covid 19 Viral Pneumonitis along with aspiration pneumonia causing respiratory arrest - he finished his COVID-19 specific treatment with steroids and Remdesivir, he was briefly intubated due to respiratory arrest and finally extubated on 07/06/2020.  At this time post extubation on 07/06/2020 he seems to have difficulty controlling his oral secretions, he is tube feed dependent through NG tube, loud gurgling in the  upper airway continues, extremely weak and deconditioned with extremely poor prognosis and high chances of repeat aspiration.  Discussed with the patient he does not want to go back on the ventilator or undergo CPR, palliative care had similar discussion with same answer, palliative care had a meeting meeting with family on 07/08/2020 - now DNR, continue medical treatment.  SpO2: 97 %  O2 Flow Rate (L/min): 2 L/min FiO2 (%): 40 %   2.  Enterococcal bacteremia.  He is currently on 6 weeks total of IV Rocephin and ampicillin through PICC line with a tentative stop date of 08/04/2020.  Poor candidate for TEE.  He has been seen by ID this admission.  For stopping his antibiotic he has to see ID.  3.  Upper GI bleed this admission.  Seen by [name] earlier, underwent EGD on 06/30/2020 showing possible gastritis, on IV PPI, monitor H&H closely.  4.  History of CAD, right bundle branch block, paroxysmal A. fib.  Baseline on Xarelto, due to recent GI bleed currently on full dose Lovenox.  Continue beta-blocker.  5.  Urinary retention.  On Foley, resume Flomax once he is able to take pills orally.  6.  Essential hypertension currently on Norvasc, Nitropaste along with beta-blocker.  7.  Excessive oral secretions.  Added scopolamine patch, secretions have improved.  8 . Alcohol abuse with DTs earlier this admission.  Seems to be stable right now.  Counseled to quit alcohol.  9.  Dysphagia due to extreme weakness and deconditioning.  Tube feeding continued.  Speech following.  10.  Third spacing of fluid and edema.  Gentle Lasix on 07/09/2020.    Condition - Extremely Guarded  Family Communication  : Son [name] [phone] on 07/07/2020, clearly explained to him that his father wants to be DNR that prognosis is extremely guarded, 07/09/20  Code Status :  DNR  Consults  :  Pall Care, PCCM, GI  Procedures  :    PICC 11/1 >   ETT 11/3 >11/10  TTE -   1. Left ventricular ejection fraction,  by estimation, is 60 to 65%. The left ventricle has normal function. The left ventricle has no regional wall motion abnormalities. There is mild left ventricular hypertrophy. Left ventricular diastolic parameters are consistent with Grade I diastolic dysfunction (impaired relaxation).  2. Right ventricular systolic function is moderately reduced. The right ventricular size is normal. There is normal pulmonary artery systolic pressure. The estimated right ventricular systolic pressure is 09.8 mmHg. Abnormal RV, would consider PE.  3. Right atrial size was mildly dilated.  4. The mitral valve is normal in structure. No evidence of mitral valve regurgitation. No evidence of mitral stenosis.  5. The aortic valve is tricuspid. Aortic valve regurgitation is trivial. Mild aortic valve sclerosis is present, with no evidence of aortic valve stenosis.  6. The inferior vena cava is normal in size with greater than 50% respiratory variability, suggesting right atrial pressure of 3 mmHg.  CT -  1. No acute intracranial abnormality. 2. Age related atrophy and chronic small vessel ischemia. 3. Opacification of the lower right greater than left mastoid air cells likely mastoid effusion, progressed from 2017. Small fluid levels in the left and right side of the sphenoid sinus.  PUD Prophylaxis : PPI  Disposition Plan  :    Status is: Inpatient  Remains inpatient appropriate because:IV treatments appropriate due to intensity of illness or inability to take PO   Dispo: The patient is from: Home              Anticipated d/c is to: SNF              Anticipated d/c date is: > 3 days              Patient currently is not medically stable to d/c.  DVT Prophylaxis  :  Lovenox  Lab Results  Component Value Date   PLT 224 07/09/2020    Diet :  Diet Order            Diet NPO time specified Except for: Sips with Meds  Diet effective now                  Inpatient Medications  Scheduled Meds: . allopurinol   300 mg Per Tube QHS  . amLODipine  10 mg Per Tube Daily  . chlorhexidine gluconate (MEDLINE KIT)  15 mL Mouth Rinse BID  . Chlorhexidine Gluconate Cloth  6 each Topical Daily  . enoxaparin (LOVENOX) injection  100 mg Subcutaneous Q12H  . folic acid  1 mg Per Tube Daily  . insulin aspart  0-6 Units Subcutaneous Q4H  . mouth rinse  15 mL Mouth Rinse 10 times per day  . metoprolol tartrate  12.5 mg Per Tube BID  . multivitamin with minerals  1 tablet Per Tube Daily  . nitroGLYCERIN  1 inch Topical Q6H  . pantoprazole sodium  40 mg Per Tube BID  . scopolamine  1 patch Transdermal Q72H  . sodium chloride flush  10-40 mL Intracatheter Q12H  . thiamine  100 mg Per Tube Daily   Continuous Infusions: . ampicillin (OMNIPEN) IV 2 g (07/09/20 0542)  . cefTRIAXone (ROCEPHIN)  IV Stopped (07/08/20 2136)  . feeding supplement (VITAL 1.5 CAL) 1,000 mL (07/09/20 0431)   PRN Meds:.acetaminophen, guaiFENesin-dextromethorphan, hydrALAZINE, [DISCONTINUED] ondansetron **OR** ondansetron (ZOFRAN) IV, polyethylene glycol, white petrolatum  Antibiotics  :    Anti-infectives (From admission, onward)   Start     Dose/Rate Route Frequency Ordered Stop   07/01/20 1200  ampicillin (OMNIPEN) 2 g in sodium chloride 0.9 % 100 mL IVPB        2 g 300 mL/hr over 20 Minutes Intravenous Every 4 hours 07/01/20 0955     06/29/20 1201  Ampicillin-Sulbactam (UNASYN) 3 g in sodium chloride 0.9 % 100 mL IVPB  Status:  Discontinued        3 g 200 mL/hr over 30 Minutes Intravenous Every 6 hours 06/29/20 0959 07/01/20 0955   06/29/20 1045  cefTRIAXone (ROCEPHIN) 2 g in sodium chloride 0.9 % 100 mL IVPB        2 g 200 mL/hr over 30 Minutes Intravenous Every 12 hours 06/29/20 0959     06/29/20 1030  cefTRIAXone (ROCEPHIN) 2 g in sodium chloride 0.9 % 100 mL IVPB  Status:  Discontinued        2 g 200 mL/hr over 30 Minutes Intravenous Every 24 hours 06/29/20 0944 06/29/20 0959   06/29/20 0500  vancomycin (VANCOREADY) IVPB 750  mg/150 mL  Status:  Discontinued        750 mg 150 mL/hr over 60 Minutes Intravenous Every 12 hours 06/28/20 1441 06/29/20 0442   06/28/20 2030  fluconazole (DIFLUCAN) IVPB 100 mg  Status:  Discontinued        100 mg 50 mL/hr over 60 Minutes Intravenous Every 24 hours 06/28/20 1906 07/01/20 1538   06/28/20 2030  Ampicillin-Sulbactam (UNASYN) 3 g in sodium chloride 0.9 % 100 mL IVPB  Status:  Discontinued        3 g 200 mL/hr over 30 Minutes Intravenous Every 8 hours 06/28/20 1940 06/29/20 0959   06/28/20 1600  acyclovir (ZOVIRAX) 110 mg in dextrose 5 % 100 mL IVPB  Status:  Discontinued        110 mg 102.2 mL/hr over 60 Minutes Intravenous  Every 8 hours 06/28/20 1441 06/28/20 1448   06/28/20 1600  acyclovir (ZOVIRAX) 900 mg in dextrose 5 % 150 mL IVPB  Status:  Discontinued        900 mg 168 mL/hr over 60 Minutes Intravenous Every 8 hours 06/28/20 1450 06/28/20 1912   06/28/20 1530  vancomycin (VANCOREADY) IVPB 1500 mg/300 mL        1,500 mg 150 mL/hr over 120 Minutes Intravenous  Once 06/28/20 1441 06/28/20 2037   06/27/20 1400  ampicillin (OMNIPEN) 2 g in sodium chloride 0.9 % 100 mL IVPB  Status:  Discontinued        2 g 300 mL/hr over 20 Minutes Intravenous Every 4 hours 06/27/20 1216 06/28/20 1437   06/25/20 1947  Ampicillin-Sulbactam (UNASYN) 3 g in sodium chloride 0.9 % 100 mL IVPB  Status:  Discontinued        3 g 200 mL/hr over 30 Minutes Intravenous Every 6 hours 06/25/20 1419 06/27/20 1218   06/24/20 2200  cefTRIAXone (ROCEPHIN) 2 g in sodium chloride 0.9 % 100 mL IVPB  Status:  Discontinued        2 g 200 mL/hr over 30 Minutes Intravenous Every 12 hours 06/24/20 1513 06/28/20 1437   06/24/20 1230  cefTRIAXone (ROCEPHIN) 2 g in sodium chloride 0.9 % 100 mL IVPB  Status:  Discontinued        2 g 200 mL/hr over 30 Minutes Intravenous Every 24 hours 06/24/20 1139 06/24/20 1513   06/23/20 1430  azithromycin (ZITHROMAX) 500 mg in sodium chloride 0.9 % 250 mL IVPB  Status:   Discontinued        500 mg 250 mL/hr over 60 Minutes Intravenous Every 24 hours 06/22/20 1406 06/22/20 1410   06/23/20 1300  Ampicillin-Sulbactam (UNASYN) 3 g in sodium chloride 0.9 % 100 mL IVPB  Status:  Discontinued        3 g 200 mL/hr over 30 Minutes Intravenous Every 8 hours 06/23/20 1131 06/25/20 1419   06/23/20 1000  remdesivir 100 mg in sodium chloride 0.9 % 100 mL IVPB        100 mg 200 mL/hr over 30 Minutes Intravenous Daily 06/22/20 1345 06/26/20 1200   06/23/20 0945  ampicillin (OMNIPEN) 2 g in sodium chloride 0.9 % 100 mL IVPB  Status:  Discontinued        2 g 300 mL/hr over 20 Minutes Intravenous Every 4 hours 06/23/20 0852 06/23/20 1131   06/22/20 1430  azithromycin (ZITHROMAX) 500 mg in sodium chloride 0.9 % 250 mL IVPB  Status:  Discontinued        500 mg 250 mL/hr over 60 Minutes Intravenous Every 24 hours 06/22/20 1410 06/23/20 0902   06/22/20 1400  remdesivir 100 mg in sodium chloride 0.9 % 100 mL IVPB        100 mg 200 mL/hr over 30 Minutes Intravenous Every 30 min 06/22/20 1345 06/22/20 1518   06/22/20 1400  cefTRIAXone (ROCEPHIN) 2 g in sodium chloride 0.9 % 100 mL IVPB  Status:  Discontinued        2 g 200 mL/hr over 30 Minutes Intravenous Every 24 hours 06/22/20 1357 06/23/20 0902   06/22/20 1400  azithromycin (ZITHROMAX) 500 mg in sodium chloride 0.9 % 250 mL IVPB  Status:  Discontinued        500 mg 250 mL/hr over 60 Minutes Intravenous Every 24 hours 06/22/20 1357 06/22/20 1406       Time Spent in minutes  30  Lala Lund M.D on 07/09/2020 at 8:59 AM  To page go to [email or web address] - password [employer]  [facility] -  Office  [phone]   See all Orders from today for further details    Objective:   Vitals:   07/09/20 0530 07/09/20 0600 07/09/20 0630 07/09/20 0700  BP:  (!) 171/85  (!) 150/72  Pulse: 86 85 87 88  Resp: (!) 25 (!) 23 (!) 28 (!) 26  Temp:      TempSrc:      SpO2: 98% 100% 96% 97%  Weight:      Height:        Wt  Readings from Last 3 Encounters:  07/09/20 108.3 kg  03/14/20 110.3 kg  09/17/19 106.6 kg     Intake/Output Summary (Last 24 hours) at 07/09/2020 0859 Last data filed at 07/09/2020 0640 Gross per 24 hour  Intake 1690.04 ml  Output 2140 ml  Net -449.96 ml     Physical Exam  Awake but appears extremely weak, NG tube in place, with Scopolamine patch oral secretions have reduced and so is gurgling, still drooling from the mouth and likely unable to control his oral secretions [place].AT,PERRAL Supple Neck,No JVD, No cervical lymphadenopathy appriciated.  Symmetrical Chest wall movement, Good air movement bilaterally, CTAB RRR,No Gallops, Rubs or new Murmurs, No Parasternal Heave +ve B.Sounds, Abd Soft, No tenderness, No organomegaly appriciated, No rebound - guarding or rigidity. No Cyanosis, Clubbing or edema, No new Rash or bruise      Data Review:    CBC Recent Labs  Lab 07/04/20 0513 07/04/20 0513 07/05/20 0500 07/06/20 0500 07/07/20 0515 07/08/20 0407 07/09/20 0531  WBC 8.7   < > 9.4 11.0* 10.6* 9.1 8.0  HGB 8.3*   < > 7.9* 9.1* 8.5* 7.9* 7.7*  HCT 26.3*   < > 24.6* 28.6* 26.8* 25.6* 24.8*  PLT 146*   < > 135* 169 177 193 224  MCV 108.2*   < > 107.4* 105.9* 106.3* 107.6* 108.8*  MCH 34.2*   < > 34.5* 33.7 33.7 33.2 33.8  MCHC 31.6   < > 32.1 31.8 31.7 30.9 31.0  RDW 15.4   < > 15.3 15.7* 15.4 15.5 15.3  LYMPHSABS 0.7  --   --   --   --  1.1 0.8  MONOABS 0.8  --   --   --   --  0.6 0.5  EOSABS 0.3  --   --   --   --  0.4 0.5  BASOSABS 0.0  --   --   --   --  0.0 0.0   < > = values in this interval not displayed.    Recent Labs  Lab 07/03/20 0339 07/03/20 0339 07/04/20 0513 07/04/20 0513 07/05/20 0500 07/06/20 0500 07/07/20 0515 07/07/20 0711 07/08/20 0407 07/08/20 0408 07/09/20 0531  NA 143   < > 141   < > 142 142 142  --  142  --  141  K 4.5   < > 4.7   < > 3.8 4.2 4.4  --  4.4  --  3.9  CL 111   < > 108   < > 105 101 104  --  104  --  106  CO2 26    < > 25   < > '28 30 28  ' --  27  --  25  GLUCOSE 131*   < > 151*   < > 139* 100* 100*  --  110*  --  109*  BUN 14   < > 16   < > '12 12 13  ' --  14  --  13  CREATININE 0.99   < > 0.81   < > 0.82 0.87 0.92  --  0.94  --  0.88  CALCIUM 8.9   < > 8.5*   < > 8.8* 9.6 9.6  --  9.7  --  9.5  AST  --   --   --   --   --   --   --   --  46*  --  35  ALT  --   --   --   --   --   --   --   --  32  --  31  ALKPHOS  --   --   --   --   --   --   --   --  64  --  61  BILITOT  --   --   --   --   --   --   --   --  0.5  --  0.4  ALBUMIN  --   --   --   --   --   --   --   --  2.0*  --  2.1*  MG 1.8  --  1.3*  --  1.6* 1.6* 1.8  --   --   --   --   CRP  --   --   --   --   --   --   --   --  2.5*  --  1.2*  DDIMER  --   --   --   --   --   --   --   --  1.86*  --  1.88*  PROCALCITON  --   --   --   --   --   --  0.30  --  0.27  --   --   BNP  --   --   --   --   --   --   --  464.9*  --  255.3* 344.4*   < > = values in this interval not displayed.    ------------------------------------------------------------------------------------------------------------------ No results for input(s): CHOL, HDL, LDLCALC, TRIG, CHOLHDL, LDLDIRECT in the last 72 hours.  Lab Results  Component Value Date   HGBA1C 5.9 (H) 06/29/2020   ------------------------------------------------------------------------------------------------------------------ No results for input(s): TSH, T4TOTAL, T3FREE, THYROIDAB in the last 72 hours.  Invalid input(s): FREET3  Cardiac Enzymes No results for input(s): CKMB, TROPONINI, MYOGLOBIN in the last 168 hours.  Invalid input(s): CK ------------------------------------------------------------------------------------------------------------------    Component Value Date/Time   BNP 344.4 (H) 07/09/2020 0531    Micro Results Recent Results (from the past 240 hour(s))  Culture, respiratory (non-expectorated)     Status: None   Collection Time: 06/29/20 11:37 AM   Specimen: Tracheal  Aspirate; Respiratory  Result Value Ref Range Status   Specimen Description TRACHEAL ASPIRATE  Final   Special Requests NONE  Final   Gram Stain   Final    FEW WBC PRESENT,BOTH PMN AND MONONUCLEAR RARE BUDDING YEAST SEEN Performed at [facility], [place] [address] [place], [place] [address]    Culture FEW CANDIDA ALBICANS  Final   Report Status 07/01/2020 FINAL  Final    Radiology Reports DG Abd 1 View  Result Date: 06/29/2020 CLINICAL DATA:  OG tube placement. EXAM: ABDOMEN - 1 VIEW COMPARISON:  Abdominal radiographs June 28, 2020, chest radiograph June 27, 2020 FINDINGS: Orogastric tube courses below the diaphragm with the tip projecting in the expected region of the stomach. Side port is below the gastroesophageal junction. Partially imaged airspace opacities in the lung bases, better characterized on recent chest radiograph. Partially imaged scattered retained contrast within colonic diverticula in the upper abdomen. IMPRESSION: 1. Orogastric tube courses below the diaphragm with the tip projecting in the expected region of the stomach. 2. Bibasilar airspace opacities. Electronically Signed   By: Margaretha Sheffield MD   On: 06/29/2020 11:21   CT HEAD WO CONTRAST  Result Date: 06/30/2020 CLINICAL DATA:  Unresponsive since respiratory arrest yesterday EXAM: CT HEAD WITHOUT CONTRAST TECHNIQUE: Contiguous axial images were obtained from the base of the skull through the vertex without intravenous contrast. COMPARISON:  CT 06/22/2020 FINDINGS: Brain: No evidence of acute infarction, hemorrhage, hydrocephalus, extra-axial collection, visible mass lesion or mass effect. Symmetric prominence of the ventricles, cisterns and sulci compatible with parenchymal volume loss. Mixed patchy and confluent areas of white matter hypoattenuation are most compatible with chronic microvascular angiopathy. Vascular: Atherosclerotic calcification of the carotid siphons and intradural vertebral arteries. No  hyperdense vessel. Skull: No scalp swelling or hematoma. No calvarial fracture or suspicious osseous lesion. Sinuses/Orbits: Minimal thickening in the ethmoids and maxillary sinuses with pneumatized secretions and layering air-fluid levels in the sphenoid sinuses possibly related to instrumentation. Bilateral mastoid effusions are present as well. Middle ear cavities are clear. Orbital structures are unremarkable aside from prior lens extractions. Other: None. IMPRESSION: 1. No CT evidence of acute large territory or cortically based infarct nor other acute intracranial abnormality. If there is persisting concern for acute infarction, MRI is more sensitive and specific for early features of ischemia. 2. Moderate parenchymal volume loss and chronic microvascular angiopathy. 3. Mural thickening in the paranasal sinuses with layering fluid in the sphenoid sinuses and bilateral mastoid effusions. Possibly related to instrumentation/intubation. Electronically Signed   By: Lovena Le M.D.   On: 06/30/2020 01:44   CT HEAD WO CONTRAST  Result Date: 06/22/2020 CLINICAL DATA:  Delirium. EXAM: CT HEAD WITHOUT CONTRAST TECHNIQUE: Contiguous axial images were obtained from the base of the skull through the vertex without intravenous contrast. COMPARISON:  Head CT 05/14/2016 FINDINGS: Brain: No intracranial hemorrhage, mass effect, or midline shift. Age related atrophy. No hydrocephalus. The basilar cisterns are patent. Mild to moderate chronic small vessel ischemia with slight progression from 2017. No evidence of territorial infarct or acute ischemia. No extra-axial or intracranial fluid collection. Vascular: Atherosclerosis of skullbase vasculature without hyperdense vessel or abnormal calcification. Skull: No fracture or focal lesion. Sinuses/Orbits: Opacification of the lower right greater than left mastoid air cells, progressed from 2017. Small fluid levels in the left and right side of sphenoid sinus. No acute  orbital abnormality. Bilateral cataract resection. Other: None. IMPRESSION: 1. No acute intracranial abnormality. 2. Age related atrophy and chronic small vessel ischemia. 3. Opacification of the lower right greater than left mastoid air cells likely mastoid effusion, progressed from 2017. Small fluid levels in the left and right side of the sphenoid sinus. Electronically Signed   By: Keith Rake M.D.   On: 06/22/2020 22:36   DG Chest Port 1 View  Result Date: 07/07/2020 CLINICAL DATA:  Shortness of breath.  COVID-19 positive EXAM: PORTABLE CHEST 1 VIEW COMPARISON:  July 05, 2020. FINDINGS: Feeding tube tip is below the diaphragm. Endotracheal tube no longer present. Central catheter tip is in the superior vena cava.  No pneumothorax. Areas of ill-defined opacity present in the mid and lower lung regions with somewhat less opacity bilaterally compared to 2 days prior. Stable apical pleural thickening. Stable cardiac prominence with pulmonary vascularity normal. Sizable hiatal type hernia present. IMPRESSION: Patchy airspace opacity bilaterally, slightly less compared to 2 days prior. Stable cardiomegaly. Sizable hiatal type hernia. Feeding tube tip below diaphragm. Electronically Signed   By: Lowella Grip III M.D.   On: 07/07/2020 08:04   DG CHEST PORT 1 VIEW  Result Date: 07/05/2020 CLINICAL DATA:  Pneumonia EXAM: PORTABLE CHEST 1 VIEW COMPARISON:  Six days ago FINDINGS: Endotracheal tube with tip halfway between the clavicular heads and carina. Feeding tube at least reaches the stomach. Left PICC with tip at the SVC. Patchy bilateral pneumonia with progression the left base. No visible effusion or pneumothorax. Cardiomegaly. IMPRESSION: 1. Unremarkable hardware. 2. Multifocal pneumonia with progression at the left base. Electronically Signed   By: Monte Fantasia M.D.   On: 07/05/2020 06:14   DG CHEST PORT 1 VIEW  Result Date: 06/29/2020 CLINICAL DATA:  Intubation EXAM: PORTABLE CHEST 1  VIEW COMPARISON:  Two days ago FINDINGS: Endotracheal tube with tip between the clavicular heads and carina. Enteric tube with side port seen over the stomach. Left PICC with tip near the SVC brachiocephalic junction. Patchy bilateral infiltrate. Cardiopericardial enlargement accentuated by rightward rotation. No visible effusion or pneumothorax. IMPRESSION: 1. Intubation with unremarkable hardware. 2. Atypical pneumonia with mild progression from 2 days ago. Electronically Signed   By: Monte Fantasia M.D.   On: 06/29/2020 05:07   DG CHEST PORT 1 VIEW  Result Date: 06/27/2020 CLINICAL DATA:  PICC in place EXAM: PORTABLE CHEST 1 VIEW COMPARISON:  06/27/2020 FINDINGS: Left arm PICC terminates in the mid SVC. Subpleural patchy opacities in the lungs bilaterally, left lower lobe predominant, compatible with pneumonia in this patient with known COVID. No pleural effusion or pneumothorax. The heart is top-normal in size. IMPRESSION: Left arm PICC terminates in the mid SVC. Multifocal pneumonia in this patient with known COVID. Electronically Signed   By: Julian Hy M.D.   On: 06/27/2020 09:35   DG Chest Port 1 View  Result Date: 06/27/2020 CLINICAL DATA:  Shortness of breath, COVID-19 positive. EXAM: PORTABLE CHEST 1 VIEW COMPARISON:  June 23, 2020. FINDINGS: Stable cardiomegaly. Stable multiple airspace opacities are noted bilaterally consistent with multifocal pneumonia. No pneumothorax or pleural effusion is noted. Bony thorax is unremarkable. IMPRESSION: Stable bilateral multifocal pneumonia. Electronically Signed   By: Marijo Conception M.D.   On: 06/27/2020 08:07   DG Chest Port 1 View  Result Date: 06/23/2020 CLINICAL DATA:  Shortness of breath and COVID. EXAM: PORTABLE CHEST 1 VIEW COMPARISON:  06/22/2020 FINDINGS: 0846 hours. Low lung volumes. Progressive patchy ground-glass and consolidative airspace opacity in the lower lungs. The cardio pericardial silhouette is enlarged. No substantial  pleural effusion. The visualized bony structures of the thorax show no acute abnormality. Telemetry leads overlie the chest. IMPRESSION: Progressive bibasilar airspace disease consistent with multifocal pneumonia. Electronically Signed   By: Misty Stanley M.D.   On: 06/23/2020 09:04   DG Chest Port 1 View  Result Date: 06/22/2020 CLINICAL DATA:  COVID, altered mental status. EXAM: PORTABLE CHEST 1 VIEW COMPARISON:  10/19/2014. FINDINGS: Low lung volumes. Subtle airspace opacities in the peripheral lung bases. No visible pleural effusions or pneumothorax. Cardiomediastinal silhouette is within normal limits. No evidence of acute osseous abnormality. IMPRESSION: Subtle airspace opacities in the peripheral lung bases, concerning for  multifocal pneumonia and consistent with reported COVID diagnosis. Electronically Signed   By: Margaretha Sheffield MD   On: 06/22/2020 11:39   DG Abd Portable 1V  Result Date: 06/28/2020 CLINICAL DATA:  Nausea, vomiting blood, COVID-19, atrial fibrillation, coronary artery disease, hypertension, former smoker EXAM: PORTABLE ABDOMEN - 1 VIEW COMPARISON:  Portable exam 1013 hours compared to CT abdomen and pelvis of 06/20/2018 FINDINGS: Scattered retained contrast in colonic diverticula and rectum. Nonobstructive bowel gas pattern. No bowel dilatation or bowel wall thickening. Bones demineralized with degenerative changes lumbar spine. IMPRESSION: Colonic diverticulosis. Electronically Signed   By: Lavonia Dana M.D.   On: 06/28/2020 10:25   DG Swallowing Func-Speech Pathology  Result Date: 06/23/2020 Objective Swallowing Evaluation: Type of Study: MBS-Modified Barium Swallow Study  Patient Details Name: ELIH MOONEY MRN: [id number] Date of Birth: 1941/01/09 Today's Date: 06/23/2020 Time: SLP Start Time (ACUTE ONLY): 1417 -SLP Stop Time (ACUTE ONLY): 7106 SLP Time Calculation (min) (ACUTE ONLY): 20 min Past Medical History: Past Medical History: Diagnosis Date . Arthritis  . Atrial  fibrillation ([place]) 01/31/2015 . Chronic gout  . Coronary artery disease 12/2007  STATUS POST STENTING OF THE LEFT ANTERIOR DESCENDING ARTERY . COVID-19  . Dyslipidemia  . Gout  . Hiatal hernia  . Hyperlipidemia  . Hypertension  . LBP (low back pain)  . Partial small bowel obstruction ([place])  . RBBB (right bundle branch block)  . SBO (small bowel obstruction) ([facility])   PARTIAL . Second degree AV block, Mobitz type I  . Urinary tract infection   Escherichia coli urinary tract infection Past Surgical History: Past Surgical History: Procedure Laterality Date . CARDIAC CATHETERIZATION   . HERNIA REPAIR    INGUINAL . ROTATOR CUFF REPAIR   HPI: 79 yo male presenting to ED with generalized weakness, poor food intake, and confusion. COVID positive. PMH including hiatal hernia, arthritis, A-fib, HTN, AKI, alcohol abuse, and CAD with s/p stenting of L anterior descending artery. CXR Progressive bibasilar airspace disease consistent with multifocal pneumonia.  No data recorded Assessment / Plan / Recommendation CHL IP CLINICAL IMPRESSIONS 06/23/2020 Clinical Impression Pt exhibits mild oropharyngeal dysphagia with aspiration of nectar consistency, and mild vallecular residual. 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 Pt unable to contain entire bolus in oral cavity and moderate amount nectar thick fell over tongue and was aspirated prior to initiation of laryngeal closure (frank volume) with immediate cough. Timing of closure delayed with thin using chin tuck and slight aspiraiton present. Lingual residue present falling into valleculae post swallow. Chin down posture using straw was effective with nectar however pt displays memory deficit and follow through of strategy is questionable. 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 Recommend honey thick liquids, Dys 3, crush meds. full supervision/assist. SLP will  work with pt on chin tuck with nectar for possible upgrade.  SLP Visit Diagnosis Dysphagia, oropharyngeal phase (R13.12) Attention and concentration deficit following -- Frontal lobe and executive function deficit following -- Impact on safety and function Mild aspiration risk;Moderate aspiration risk   CHL IP TREATMENT RECOMMENDATION 06/23/2020 Treatment Recommendations Therapy as outlined in treatment plan below   Prognosis 06/23/2020 Prognosis for Safe Diet Advancement (No Data) Barriers to Reach Goals Cognitive deficits Barriers/Prognosis Comment -- CHL IP DIET RECOMMENDATION 06/23/2020 SLP Diet Recommendations Dysphagia 3 (Mech soft) solids;Honey thick liquids Liquid Administration via Cup Medication Administration Crushed with puree Compensations Minimize environmental distractions;Slow rate;Small sips/bites  Postural Changes Seated upright at 90 degrees;Remain semi-upright after after feeds/meals (Comment)   CHL IP OTHER RECOMMENDATIONS 06/23/2020 Recommended Consults -- Oral Care Recommendations Oral care BID Other Recommendations --   CHL IP FOLLOW UP RECOMMENDATIONS 06/23/2020 Follow up Recommendations (No Data)   CHL IP FREQUENCY AND DURATION 06/23/2020 Speech Therapy Frequency (ACUTE ONLY) min 2x/week Treatment Duration 2 weeks      CHL IP ORAL PHASE 06/23/2020 Oral Phase Impaired Oral - Pudding Teaspoon -- Oral - Pudding Cup -- Oral - Honey Teaspoon -- Oral - Honey Cup Lingual/palatal residue;Piecemeal swallowing Oral - Nectar Teaspoon -- Oral - Nectar Cup Decreased bolus cohesion Oral - Nectar Straw Decreased bolus cohesion Oral - Thin Teaspoon -- Oral - Thin Cup -- Oral - Thin Straw -- Oral - Puree Piecemeal swallowing Oral - Mech Soft -- Oral - Regular -- Oral - Multi-Consistency -- Oral - Pill -- Oral Phase - Comment --  CHL IP PHARYNGEAL PHASE 06/23/2020 Pharyngeal Phase Impaired Pharyngeal- Pudding Teaspoon -- Pharyngeal -- Pharyngeal- Pudding Cup -- Pharyngeal -- Pharyngeal- Honey Teaspoon --  Pharyngeal -- Pharyngeal- Honey Cup Pharyngeal residue - valleculae Pharyngeal -- Pharyngeal- Nectar Teaspoon -- Pharyngeal -- Pharyngeal- Nectar Cup Penetration/Aspiration before swallow Pharyngeal Material enters airway, passes BELOW cords and not ejected out despite cough attempt by patient Pharyngeal- Nectar Straw Penetration/Aspiration during swallow Pharyngeal Material enters airway, remains ABOVE vocal cords then ejected out;Material does not enter airway Pharyngeal- Thin Teaspoon NT Pharyngeal -- Pharyngeal- Thin Cup NT Pharyngeal -- Pharyngeal- Thin Straw Penetration/Aspiration during swallow Pharyngeal Material enters airway, passes BELOW cords without attempt by patient to eject out (silent aspiration) Pharyngeal- Puree Pharyngeal residue - valleculae Pharyngeal -- Pharyngeal- Mechanical Soft -- Pharyngeal -- Pharyngeal- Regular WFL Pharyngeal -- Pharyngeal- Multi-consistency -- Pharyngeal -- Pharyngeal- Pill -- Pharyngeal -- Pharyngeal Comment --  CHL IP CERVICAL ESOPHAGEAL PHASE 06/23/2020 Cervical Esophageal Phase WFL Pudding Teaspoon -- Pudding Cup -- Honey Teaspoon -- Honey Cup -- Nectar Teaspoon -- Nectar Cup -- Nectar Straw -- Thin Teaspoon -- Thin Cup -- Thin Straw -- Puree -- Mechanical Soft -- Regular -- Multi-consistency -- Pill -- Cervical Esophageal Comment -- [place] Siren 06/23/2020, 4:04 PM Orbie Pyo Colvin Caroli.Ed Actor Pager [phone] Office [phone]              ECHOCARDIOGRAM COMPLETE  Result Date: 06/23/2020    ECHOCARDIOGRAM REPORT   Patient Name:   KIERON KANTNER Date of Exam: 06/23/2020 Medical Rec #:  [id number]      Height:       71.0 in Accession #:    [id number]     Weight:       244.7 lb Date of Birth:  07/12/1941      BSA:          2.297 m Patient Age:    8 years       BP:           170/76 mmHg Patient Gender: M              HR:           71 bpm. Exam Location:  Inpatient Procedure: 2D Echo, Cardiac Doppler and Color Doppler  Indications:    R07.9* Chest pain, unspecified. Elevated troponin  History:        Patient has prior history of Echocardiogram examinations, most                 recent 02/24/2019. CAD, Abnormal ECG, Arrythmias:RBBB,  Signs/Symptoms:Hypotension; Risk Factors:Dyslipidemia. Covid 19                 positive. ETOH.  Sonographer:    Roseanna Rainbow RDCS Referring Phys: [place]  Sonographer Comments: Patient in Fowlaer's position. IMPRESSIONS  1. Left ventricular ejection fraction, by estimation, is 60 to 65%. The left ventricle has normal function. The left ventricle has no regional wall motion abnormalities. There is mild left ventricular hypertrophy. Left ventricular diastolic parameters are consistent with Grade I diastolic dysfunction (impaired relaxation).  2. Right ventricular systolic function is moderately reduced. The right ventricular size is normal. There is normal pulmonary artery systolic pressure. The estimated right ventricular systolic pressure is 50.0 mmHg. Abnormal RV, would consider PE.  3. Right atrial size was mildly dilated.  4. The mitral valve is normal in structure. No evidence of mitral valve regurgitation. No evidence of mitral stenosis.  5. The aortic valve is tricuspid. Aortic valve regurgitation is trivial. Mild aortic valve sclerosis is present, with no evidence of aortic valve stenosis.  6. The inferior vena cava is normal in size with greater than 50% respiratory variability, suggesting right atrial pressure of 3 mmHg. FINDINGS  Left Ventricle: Left ventricular ejection fraction, by estimation, is 60 to 65%. The left ventricle has normal function. The left ventricle has no regional wall motion abnormalities. The left ventricular internal cavity size was normal in size. There is  mild left ventricular hypertrophy. Left ventricular diastolic parameters are consistent with Grade I diastolic dysfunction (impaired relaxation). Right Ventricle: The right ventricular  size is normal. No increase in right ventricular wall thickness. Right ventricular systolic function is moderately reduced. There is normal pulmonary artery systolic pressure. The tricuspid regurgitant velocity is 2.75 m/s, and with an assumed right atrial pressure of 3 mmHg, the estimated right ventricular systolic pressure is 93.8 mmHg. Left Atrium: Left atrial size was normal in size. Right Atrium: Right atrial size was mildly dilated. Pericardium: There is no evidence of pericardial effusion. Mitral Valve: The mitral valve is normal in structure. No evidence of mitral valve regurgitation. No evidence of mitral valve stenosis. Tricuspid Valve: The tricuspid valve is normal in structure. Tricuspid valve regurgitation is trivial. Aortic Valve: The aortic valve is tricuspid. Aortic valve regurgitation is trivial. Mild aortic valve sclerosis is present, with no evidence of aortic valve stenosis. Pulmonic Valve: The pulmonic valve was normal in structure. Pulmonic valve regurgitation is not visualized. Aorta: The aortic root is normal in size and structure. Venous: The inferior vena cava is normal in size with greater than 50% respiratory variability, suggesting right atrial pressure of 3 mmHg. IAS/Shunts: No atrial level shunt detected by color flow Doppler.  LEFT VENTRICLE PLAX 2D LVIDd:         3.80 cm     Diastology LVIDs:         2.60 cm     LV e' medial:    4.35 cm/s LV PW:         1.70 cm     LV E/e' medial:  16.9 LV IVS:        1.50 cm     LV e' lateral:   5.33 cm/s LVOT diam:     2.30 cm     LV E/e' lateral: 13.8 LV SV:         97 LV SV Index:   42 LVOT Area:     4.15 cm  LV Volumes (MOD) LV vol d, MOD A2C: 78.7 ml LV vol  d, MOD A4C: 84.2 ml LV vol s, MOD A2C: 17.5 ml LV vol s, MOD A4C: 25.2 ml LV SV MOD A2C:     61.2 ml LV SV MOD A4C:     84.2 ml LV SV MOD BP:      64.3 ml RIGHT VENTRICLE             IVC RV S prime:     18.50 cm/s  IVC diam: 2.20 cm TAPSE (M-mode): 3.2 cm LEFT ATRIUM             Index        RIGHT ATRIUM           Index LA diam:        3.70 cm 1.61 cm/m  RA Area:     18.30 cm LA Vol (A2C):   34.1 ml 14.84 ml/m RA Volume:   53.10 ml  23.11 ml/m LA Vol (A4C):   49.7 ml 21.63 ml/m LA Biplane Vol: 43.7 ml 19.02 ml/m  AORTIC VALVE LVOT Vmax:   124.00 cm/s LVOT Vmean:  89.800 cm/s LVOT VTI:    0.233 m  AORTA Ao Root diam: 3.50 cm MITRAL VALVE               TRICUSPID VALVE MV Area (PHT): 3.21 cm    TR Peak grad:   30.2 mmHg MV Decel Time: 236 msec    TR Vmax:        275.00 cm/s MV E velocity: 73.30 cm/s MV A velocity: 99.05 cm/s  SHUNTS MV E/A ratio:  0.74        Systemic VTI:  0.23 m                            Systemic Diam: 2.30 cm Loralie Champagne MD Electronically signed by Loralie Champagne MD Signature Date/Time: 06/23/2020/4:53:51 PM    Final    [place] EKG SITE RITE  Result Date: 06/29/2020 If Site Rite image not attached, placement could not be confirmed due to current cardiac rhythm.  [place] EKG SITE RITE  Result Date: 06/25/2020 If Site Rite image not attached, placement could not be confirmed due to current cardiac rhythm.

## 2020-07-09 NOTE — Progress Notes (Addendum)
PHARMACY CONSULT NOTE FOR:  OUTPATIENT  PARENTERAL ANTIBIOTIC THERAPY (OPAT)  Indication: Bacteremia Regimen: Ampicillin 12 gms IV q24hrs as a continuous infusion and Rocephin 2 gms IV q12hr End date: 08/03/20  IV antibiotic discharge orders are pended. To discharging provider:  please sign these orders via discharge navigator,  Select New Orders & click on the button choice - Manage This Unsigned Work.     Thank you for allowing pharmacy to be a part of this patient's care.  Tera Mater 07/09/2020, 11:01 AM

## 2020-07-10 DIAGNOSIS — G9340 Encephalopathy, unspecified: Secondary | ICD-10-CM | POA: Diagnosis not present

## 2020-07-10 LAB — CBC WITH DIFFERENTIAL/PLATELET
Abs Immature Granulocytes: 0.05 10*3/uL (ref 0.00–0.07)
Basophils Absolute: 0 10*3/uL (ref 0.0–0.1)
Basophils Relative: 1 %
Eosinophils Absolute: 0.6 10*3/uL — ABNORMAL HIGH (ref 0.0–0.5)
Eosinophils Relative: 7 %
HCT: 26.3 % — ABNORMAL LOW (ref 39.0–52.0)
Hemoglobin: 8.2 g/dL — ABNORMAL LOW (ref 13.0–17.0)
Immature Granulocytes: 1 %
Lymphocytes Relative: 13 %
Lymphs Abs: 1.1 10*3/uL (ref 0.7–4.0)
MCH: 33.5 pg (ref 26.0–34.0)
MCHC: 31.2 g/dL (ref 30.0–36.0)
MCV: 107.3 fL — ABNORMAL HIGH (ref 80.0–100.0)
Monocytes Absolute: 0.5 10*3/uL (ref 0.1–1.0)
Monocytes Relative: 5 %
Neutro Abs: 6.3 10*3/uL (ref 1.7–7.7)
Neutrophils Relative %: 73 %
Platelets: 276 10*3/uL (ref 150–400)
RBC: 2.45 MIL/uL — ABNORMAL LOW (ref 4.22–5.81)
RDW: 15.7 % — ABNORMAL HIGH (ref 11.5–15.5)
WBC: 8.6 10*3/uL (ref 4.0–10.5)
nRBC: 0 % (ref 0.0–0.2)

## 2020-07-10 LAB — BRAIN NATRIURETIC PEPTIDE: B Natriuretic Peptide: 197.7 pg/mL — ABNORMAL HIGH (ref 0.0–100.0)

## 2020-07-10 LAB — COMPREHENSIVE METABOLIC PANEL
ALT: 29 U/L (ref 0–44)
AST: 31 U/L (ref 15–41)
Albumin: 2.2 g/dL — ABNORMAL LOW (ref 3.5–5.0)
Alkaline Phosphatase: 64 U/L (ref 38–126)
Anion gap: 10 (ref 5–15)
BUN: 11 mg/dL (ref 8–23)
CO2: 26 mmol/L (ref 22–32)
Calcium: 9.5 mg/dL (ref 8.9–10.3)
Chloride: 106 mmol/L (ref 98–111)
Creatinine, Ser: 0.89 mg/dL (ref 0.61–1.24)
GFR, Estimated: >60 mL/min (ref >60)
Glucose, Bld: 109 mg/dL — ABNORMAL HIGH (ref 70–99)
Potassium: 3.7 mmol/L (ref 3.5–5.1)
Sodium: 142 mmol/L (ref 135–145)
Total Bilirubin: 0.6 mg/dL (ref 0.3–1.2)
Total Protein: 5.7 g/dL — ABNORMAL LOW (ref 6.5–8.1)

## 2020-07-10 LAB — D-DIMER, QUANTITATIVE: D-Dimer, Quant: 1.94 ug/mL-FEU — ABNORMAL HIGH (ref 0.00–0.50)

## 2020-07-10 LAB — GLUCOSE, CAPILLARY
Glucose-Capillary: 101 mg/dL — ABNORMAL HIGH (ref 70–99)
Glucose-Capillary: 101 mg/dL — ABNORMAL HIGH (ref 70–99)
Glucose-Capillary: 104 mg/dL — ABNORMAL HIGH (ref 70–99)
Glucose-Capillary: 104 mg/dL — ABNORMAL HIGH (ref 70–99)
Glucose-Capillary: 105 mg/dL — ABNORMAL HIGH (ref 70–99)
Glucose-Capillary: 112 mg/dL — ABNORMAL HIGH (ref 70–99)

## 2020-07-10 LAB — C-REACTIVE PROTEIN: CRP: 0.9 mg/dL (ref <1.0)

## 2020-07-10 MED ORDER — FUROSEMIDE 40 MG PO TABS
40.0000 mg | ORAL_TABLET | Freq: Once | ORAL | Status: AC
  Administered 2020-07-10: 40 mg
  Filled 2020-07-10: qty 1

## 2020-07-10 MED ORDER — POTASSIUM CHLORIDE 20 MEQ PO PACK
40.0000 meq | PACK | Freq: Once | ORAL | Status: AC
  Administered 2020-07-10: 40 meq via ORAL
  Filled 2020-07-10: qty 2

## 2020-07-10 NOTE — Progress Notes (Signed)
PROGRESS NOTE                                                                                                                                                                                                             Patient Demographics:    Gregory Wheeler, is a 79 y.o. male, DOB - 11-14-1940, ULA:[id number]  Outpatient Primary MD for the patient is [name], NP    LOS - 41  Admit date - 06/22/2020    Chief Complaint  Patient presents with   Covid pos  and Not eating or drinking       Brief Narrative (HPI from H&P)   79 year old Caucasian male who was admitted for COVID-19 pneumonia despite being fully vaccinated, his COVID-1 pneumonia was actually quite limited however he went into full-blown DTs and stay was complicated by upper GI bleed for which he was seen by [name], respiratory arrest resuscitation, critical care was consulted and he was intubated for close to a week, post extubation he is showing signs of ongoing aspiration of oral secretions, extremely frail and weak.  Now wants to be DNR.  We will continue medical treatment and monitor closely for another 24 hours in ICU.  Will involve palliative care as well.   Subjective:   Patient in bed NG tube in place, still gurgling on his oral secretions, denies any headache, denies any chest or abdominal pain.  Still appears very weak and lethargic.   Assessment  & Plan :     1. Acute Hypoxic Resp. Failure due to Acute Covid 19 Viral Pneumonitis along with aspiration pneumonia causing respiratory arrest - he finished his COVID-19 specific treatment with steroids and Remdesivir, he was briefly intubated due to respiratory arrest and finally extubated on 07/06/2020.  At this time post extubation on 07/06/2020 he seems to have difficulty controlling his oral secretions, he is tube feed dependent through NG tube, loud gurgling in the upper airway continues, extremely  weak and deconditioned with extremely poor prognosis and high chances of repeat aspiration.  Discussed with the patient he does not want to go back on the ventilator or undergo CPR, palliative  care had similar discussion with same answer, palliative care had a meeting meeting with family on 07/08/2020 - now DNR, continue medical treatment.  SpO2: 95 % O2 Flow Rate (L/min): 2 L/min FiO2 (%): 40 %   2.  Enterococcal bacteremia.  He is currently on 6 weeks total of IV Rocephin and ampicillin through PICC line with a tentative stop date of 08/04/2020.  Poor candidate for TEE.  He has been seen by ID this admission.  For stopping his antibiotic he has to see ID.  3.  Upper GI bleed this admission.  Seen by [name] earlier, underwent EGD on 06/30/2020 showing possible gastritis, on IV PPI, monitor H&H closely.  4.  History of CAD, right bundle branch block, paroxysmal A. fib.  Baseline on Xarelto, due to recent GI bleed currently on full dose Lovenox.  Continue beta-blocker.  5.  Urinary retention.  On Foley, resume Flomax once he is able to take pills orally.  6.  Essential hypertension currently on Norvasc, Nitropaste along with beta-blocker.  7.  Excessive oral secretions.  Added scopolamine patch, secretions have improved.  8 . Alcohol abuse with DTs earlier this admission.  Seems to be stable right now.  Counseled to quit alcohol.  9.  Dysphagia due to extreme weakness and deconditioning.  Tube feeding continued.  Speech following.  10. Third spacing of fluid and edema.  Gentle Lasix PRN with KCL.    Condition - Extremely Guarded  Family Communication  : Son [name] [phone] on 07/07/2020, clearly explained to him that his father wants to be DNR that prognosis is extremely guarded, 07/09/20, 07/10/20  Code Status :  DNR  Consults  :  Pall Care, PCCM, GI  Procedures  :    PICC 11/1 >   ETT 11/3 >11/10  TTE -   1. Left ventricular ejection fraction, by estimation, is 60 to  65%. The left ventricle has normal function. The left ventricle has no regional wall motion abnormalities. There is mild left ventricular hypertrophy. Left ventricular diastolic parameters are consistent with Grade I diastolic dysfunction (impaired relaxation).  2. Right ventricular systolic function is moderately reduced. The right ventricular size is normal. There is normal pulmonary artery systolic pressure. The estimated right ventricular systolic pressure is 16.9 mmHg. Abnormal RV, would consider PE.  3. Right atrial size was mildly dilated.  4. The mitral valve is normal in structure. No evidence of mitral valve regurgitation. No evidence of mitral stenosis.  5. The aortic valve is tricuspid. Aortic valve regurgitation is trivial. Mild aortic valve sclerosis is present, with no evidence of aortic valve stenosis.  6. The inferior vena cava is normal in size with greater than 50% respiratory variability, suggesting right atrial pressure of 3 mmHg.  CT -  1. No acute intracranial abnormality. 2. Age related atrophy and chronic small vessel ischemia. 3. Opacification of the lower right greater than left mastoid air cells likely mastoid effusion, progressed from 2017. Small fluid levels in the left and right side of the sphenoid sinus.  PUD Prophylaxis : PPI  Disposition Plan  :    Status is: Inpatient  Remains inpatient appropriate because:IV treatments appropriate due to intensity of illness or inability to take PO   Dispo: The patient is from: Home              Anticipated d/c is to: SNF              Anticipated d/c date is: > 3 days  Patient currently is not medically stable to d/c.  DVT Prophylaxis  :  Lovenox   Lab Results  Component Value Date   PLT 276 07/10/2020    Diet :  Diet Order            Diet NPO time specified Except for: Sips with Meds  Diet effective now                  Inpatient Medications  Scheduled Meds:  allopurinol  300 mg Per Tube QHS     amLODipine  10 mg Per Tube Daily   chlorhexidine gluconate (MEDLINE KIT)  15 mL Mouth Rinse BID   Chlorhexidine Gluconate Cloth  6 each Topical Daily   enoxaparin (LOVENOX) injection  100 mg Subcutaneous G40N   folic acid  1 mg Per Tube Daily   furosemide  40 mg Per Tube Once   insulin aspart  0-6 Units Subcutaneous Q4H   mouth rinse  15 mL Mouth Rinse 10 times per day   metoprolol tartrate  12.5 mg Per Tube BID   multivitamin with minerals  1 tablet Per Tube Daily   nitroGLYCERIN  1 inch Topical Q6H   pantoprazole sodium  40 mg Per Tube BID   potassium chloride  40 mEq Oral Once   scopolamine  1 patch Transdermal Q72H   sodium chloride flush  10-40 mL Intracatheter Q12H   thiamine  100 mg Per Tube Daily   Continuous Infusions:  ampicillin (OMNIPEN) IV 2 g (07/10/20 0647)   cefTRIAXone (ROCEPHIN)  IV 2 g (07/10/20 0859)   feeding supplement (VITAL 1.5 CAL) 1,000 mL (07/10/20 0245)   PRN Meds:.acetaminophen, guaiFENesin-dextromethorphan, hydrALAZINE, LORazepam, [DISCONTINUED] ondansetron **OR** ondansetron (ZOFRAN) IV, polyethylene glycol, white petrolatum  Antibiotics  :    Anti-infectives (From admission, onward)   Start     Dose/Rate Route Frequency Ordered Stop   07/01/20 1200  ampicillin (OMNIPEN) 2 g in sodium chloride 0.9 % 100 mL IVPB        2 g 300 mL/hr over 20 Minutes Intravenous Every 4 hours 07/01/20 0955     06/29/20 1201  Ampicillin-Sulbactam (UNASYN) 3 g in sodium chloride 0.9 % 100 mL IVPB  Status:  Discontinued        3 g 200 mL/hr over 30 Minutes Intravenous Every 6 hours 06/29/20 0959 07/01/20 0955   06/29/20 1045  cefTRIAXone (ROCEPHIN) 2 g in sodium chloride 0.9 % 100 mL IVPB        2 g 200 mL/hr over 30 Minutes Intravenous Every 12 hours 06/29/20 0959     06/29/20 1030  cefTRIAXone (ROCEPHIN) 2 g in sodium chloride 0.9 % 100 mL IVPB  Status:  Discontinued        2 g 200 mL/hr over 30 Minutes Intravenous Every 24 hours 06/29/20 0944  06/29/20 0959   06/29/20 0500  vancomycin (VANCOREADY) IVPB 750 mg/150 mL  Status:  Discontinued        750 mg 150 mL/hr over 60 Minutes Intravenous Every 12 hours 06/28/20 1441 06/29/20 0442   06/28/20 2030  fluconazole (DIFLUCAN) IVPB 100 mg  Status:  Discontinued        100 mg 50 mL/hr over 60 Minutes Intravenous Every 24 hours 06/28/20 1906 07/01/20 1538   06/28/20 2030  Ampicillin-Sulbactam (UNASYN) 3 g in sodium chloride 0.9 % 100 mL IVPB  Status:  Discontinued        3 g 200 mL/hr over 30 Minutes Intravenous Every 8 hours 06/28/20  1940 06/29/20 0959   06/28/20 1600  acyclovir (ZOVIRAX) 110 mg in dextrose 5 % 100 mL IVPB  Status:  Discontinued        110 mg 102.2 mL/hr over 60 Minutes Intravenous Every 8 hours 06/28/20 1441 06/28/20 1448   06/28/20 1600  acyclovir (ZOVIRAX) 900 mg in dextrose 5 % 150 mL IVPB  Status:  Discontinued        900 mg 168 mL/hr over 60 Minutes Intravenous Every 8 hours 06/28/20 1450 06/28/20 1912   06/28/20 1530  vancomycin (VANCOREADY) IVPB 1500 mg/300 mL        1,500 mg 150 mL/hr over 120 Minutes Intravenous  Once 06/28/20 1441 06/28/20 2037   06/27/20 1400  ampicillin (OMNIPEN) 2 g in sodium chloride 0.9 % 100 mL IVPB  Status:  Discontinued        2 g 300 mL/hr over 20 Minutes Intravenous Every 4 hours 06/27/20 1216 06/28/20 1437   06/25/20 1947  Ampicillin-Sulbactam (UNASYN) 3 g in sodium chloride 0.9 % 100 mL IVPB  Status:  Discontinued        3 g 200 mL/hr over 30 Minutes Intravenous Every 6 hours 06/25/20 1419 06/27/20 1218   06/24/20 2200  cefTRIAXone (ROCEPHIN) 2 g in sodium chloride 0.9 % 100 mL IVPB  Status:  Discontinued        2 g 200 mL/hr over 30 Minutes Intravenous Every 12 hours 06/24/20 1513 06/28/20 1437   06/24/20 1230  cefTRIAXone (ROCEPHIN) 2 g in sodium chloride 0.9 % 100 mL IVPB  Status:  Discontinued        2 g 200 mL/hr over 30 Minutes Intravenous Every 24 hours 06/24/20 1139 06/24/20 1513   06/23/20 1430  azithromycin  (ZITHROMAX) 500 mg in sodium chloride 0.9 % 250 mL IVPB  Status:  Discontinued        500 mg 250 mL/hr over 60 Minutes Intravenous Every 24 hours 06/22/20 1406 06/22/20 1410   06/23/20 1300  Ampicillin-Sulbactam (UNASYN) 3 g in sodium chloride 0.9 % 100 mL IVPB  Status:  Discontinued        3 g 200 mL/hr over 30 Minutes Intravenous Every 8 hours 06/23/20 1131 06/25/20 1419   06/23/20 1000  remdesivir 100 mg in sodium chloride 0.9 % 100 mL IVPB        100 mg 200 mL/hr over 30 Minutes Intravenous Daily 06/22/20 1345 06/26/20 1200   06/23/20 0945  ampicillin (OMNIPEN) 2 g in sodium chloride 0.9 % 100 mL IVPB  Status:  Discontinued        2 g 300 mL/hr over 20 Minutes Intravenous Every 4 hours 06/23/20 0852 06/23/20 1131   06/22/20 1430  azithromycin (ZITHROMAX) 500 mg in sodium chloride 0.9 % 250 mL IVPB  Status:  Discontinued        500 mg 250 mL/hr over 60 Minutes Intravenous Every 24 hours 06/22/20 1410 06/23/20 0902   06/22/20 1400  remdesivir 100 mg in sodium chloride 0.9 % 100 mL IVPB        100 mg 200 mL/hr over 30 Minutes Intravenous Every 30 min 06/22/20 1345 06/22/20 1518   06/22/20 1400  cefTRIAXone (ROCEPHIN) 2 g in sodium chloride 0.9 % 100 mL IVPB  Status:  Discontinued        2 g 200 mL/hr over 30 Minutes Intravenous Every 24 hours 06/22/20 1357 06/23/20 0902   06/22/20 1400  azithromycin (ZITHROMAX) 500 mg in sodium chloride 0.9 % 250 mL IVPB  Status:  Discontinued        500 mg 250 mL/hr over 60 Minutes Intravenous Every 24 hours 06/22/20 1357 06/22/20 1406       Time Spent in minutes  30   Lala Lund M.D on 07/10/2020 at 10:40 AM  To page go to [email or web address] - password [facility]  [facility] -  Office  [phone]   See all Orders from today for further details    Objective:   Vitals:   07/10/20 0400 07/10/20 0446 07/10/20 0605 07/10/20 0731  BP: 109/90  (!) 145/96 (!) 164/74  Pulse: 96  95 96  Resp: (!) 21  18 (!) 22  Temp: 98.1 F (36.7 C)   97.9  F (36.6 C)  TempSrc: Oral   Oral  SpO2: 97%  95% 95%  Weight:  103.5 kg    Height:        Wt Readings from Last 3 Encounters:  07/10/20 103.5 kg  03/14/20 110.3 kg  09/17/19 106.6 kg     Intake/Output Summary (Last 24 hours) at 07/10/2020 1040 Last data filed at 07/10/2020 0600 Gross per 24 hour  Intake 2358.25 ml  Output 3575 ml  Net -1216.75 ml     Physical Exam  Awake but appears extremely weak, NG tube in place, with Scopolamine patch oral secretions have reduced and so is gurgling, still drooling from the mouth and likely unable to control his oral secretions [place].AT,PERRAL Supple Neck,No JVD, No cervical lymphadenopathy appriciated.  Symmetrical Chest wall movement, Good air movement bilaterally, CTAB RRR,No Gallops, Rubs or new Murmurs, No Parasternal Heave +ve B.Sounds, Abd Soft, No tenderness, No organomegaly appriciated, No rebound - guarding or rigidity. No Cyanosis, Clubbing or edema, No new Rash or bruise    Data Review:    CBC Recent Labs  Lab 07/04/20 0513 07/05/20 0500 07/06/20 0500 07/07/20 0515 07/08/20 0407 07/09/20 0531 07/10/20 0356  WBC 8.7   < > 11.0* 10.6* 9.1 8.0 8.6  HGB 8.3*   < > 9.1* 8.5* 7.9* 7.7* 8.2*  HCT 26.3*   < > 28.6* 26.8* 25.6* 24.8* 26.3*  PLT 146*   < > 169 177 193 224 276  MCV 108.2*   < > 105.9* 106.3* 107.6* 108.8* 107.3*  MCH 34.2*   < > 33.7 33.7 33.2 33.8 33.5  MCHC 31.6   < > 31.8 31.7 30.9 31.0 31.2  RDW 15.4   < > 15.7* 15.4 15.5 15.3 15.7*  LYMPHSABS 0.7  --   --   --  1.1 0.8 1.1  MONOABS 0.8  --   --   --  0.6 0.5 0.5  EOSABS 0.3  --   --   --  0.4 0.5 0.6*  BASOSABS 0.0  --   --   --  0.0 0.0 0.0   < > = values in this interval not displayed.    Recent Labs  Lab 07/04/20 0513 07/04/20 0513 07/05/20 0500 07/05/20 0500 07/06/20 0500 07/07/20 0515 07/07/20 0711 07/08/20 0407 07/08/20 0408 07/09/20 0531 07/10/20 0356  NA 141   < > 142   < > 142 142  --  142  --  141 142  K 4.7   < > 3.8   < >  4.2 4.4  --  4.4  --  3.9 3.7  CL 108   < > 105   < > 101 104  --  104  --  106 106  CO2 25   < > 28   < >  30 28  --  27  --  25 26  GLUCOSE 151*   < > 139*   < > 100* 100*  --  110*  --  109* 109*  BUN 16   < > 12   < > 12 13  --  14  --  13 11  CREATININE 0.81   < > 0.82   < > 0.87 0.92  --  0.94  --  0.88 0.89  CALCIUM 8.5*   < > 8.8*   < > 9.6 9.6  --  9.7  --  9.5 9.5  AST  --   --   --   --   --   --   --  46*  --  35 31  ALT  --   --   --   --   --   --   --  32  --  31 29  ALKPHOS  --   --   --   --   --   --   --  64  --  61 64  BILITOT  --   --   --   --   --   --   --  0.5  --  0.4 0.6  ALBUMIN  --   --   --   --   --   --   --  2.0*  --  2.1* 2.2*  MG 1.3*  --  1.6*  --  1.6* 1.8  --   --   --   --   --   CRP  --   --   --   --   --   --   --  2.5*  --  1.2* 0.9  DDIMER  --   --   --   --   --   --   --  1.86*  --  1.88* 1.94*  PROCALCITON  --   --   --   --   --  0.30  --  0.27  --   --   --   BNP  --   --   --   --   --   --  464.9*  --  255.3* 344.4* 197.7*   < > = values in this interval not displayed.    ------------------------------------------------------------------------------------------------------------------ No results for input(s): CHOL, HDL, LDLCALC, TRIG, CHOLHDL, LDLDIRECT in the last 72 hours.  Lab Results  Component Value Date   HGBA1C 5.9 (H) 06/29/2020   ------------------------------------------------------------------------------------------------------------------ No results for input(s): TSH, T4TOTAL, T3FREE, THYROIDAB in the last 72 hours.  Invalid input(s): FREET3  Cardiac Enzymes No results for input(s): CKMB, TROPONINI, MYOGLOBIN in the last 168 hours.  Invalid input(s): CK ------------------------------------------------------------------------------------------------------------------    Component Value Date/Time   BNP 197.7 (H) 07/10/2020 0356    Micro Results Recent Results (from the past 240 hour(s))  Culture, Urine      Status: None   Collection Time: 07/07/20  8:55 AM   Specimen: Urine, Random  Result Value Ref Range Status   Specimen Description URINE, RANDOM  Final   Special Requests NONE  Final   Culture   Final    NO GROWTH Performed at [facility], [address], [place], [place] [address]    Report Status 07/09/2020 FINAL  Final    Radiology Reports DG Abd 1 View  Result Date: 06/29/2020 CLINICAL DATA:  OG tube placement. EXAM: ABDOMEN - 1 VIEW  COMPARISON:  Abdominal radiographs June 28, 2020, chest radiograph June 27, 2020 FINDINGS: Orogastric tube courses below the diaphragm with the tip projecting in the expected region of the stomach. Side port is below the gastroesophageal junction. Partially imaged airspace opacities in the lung bases, better characterized on recent chest radiograph. Partially imaged scattered retained contrast within colonic diverticula in the upper abdomen. IMPRESSION: 1. Orogastric tube courses below the diaphragm with the tip projecting in the expected region of the stomach. 2. Bibasilar airspace opacities. Electronically Signed   By: Margaretha Sheffield MD   On: 06/29/2020 11:21   CT HEAD WO CONTRAST  Result Date: 06/30/2020 CLINICAL DATA:  Unresponsive since respiratory arrest yesterday EXAM: CT HEAD WITHOUT CONTRAST TECHNIQUE: Contiguous axial images were obtained from the base of the skull through the vertex without intravenous contrast. COMPARISON:  CT 06/22/2020 FINDINGS: Brain: No evidence of acute infarction, hemorrhage, hydrocephalus, extra-axial collection, visible mass lesion or mass effect. Symmetric prominence of the ventricles, cisterns and sulci compatible with parenchymal volume loss. Mixed patchy and confluent areas of white matter hypoattenuation are most compatible with chronic microvascular angiopathy. Vascular: Atherosclerotic calcification of the carotid siphons and intradural vertebral arteries. No hyperdense vessel. Skull: No scalp swelling or  hematoma. No calvarial fracture or suspicious osseous lesion. Sinuses/Orbits: Minimal thickening in the ethmoids and maxillary sinuses with pneumatized secretions and layering air-fluid levels in the sphenoid sinuses possibly related to instrumentation. Bilateral mastoid effusions are present as well. Middle ear cavities are clear. Orbital structures are unremarkable aside from prior lens extractions. Other: None. IMPRESSION: 1. No CT evidence of acute large territory or cortically based infarct nor other acute intracranial abnormality. If there is persisting concern for acute infarction, MRI is more sensitive and specific for early features of ischemia. 2. Moderate parenchymal volume loss and chronic microvascular angiopathy. 3. Mural thickening in the paranasal sinuses with layering fluid in the sphenoid sinuses and bilateral mastoid effusions. Possibly related to instrumentation/intubation. Electronically Signed   By: Lovena Le M.D.   On: 06/30/2020 01:44   CT HEAD WO CONTRAST  Result Date: 06/22/2020 CLINICAL DATA:  Delirium. EXAM: CT HEAD WITHOUT CONTRAST TECHNIQUE: Contiguous axial images were obtained from the base of the skull through the vertex without intravenous contrast. COMPARISON:  Head CT 05/14/2016 FINDINGS: Brain: No intracranial hemorrhage, mass effect, or midline shift. Age related atrophy. No hydrocephalus. The basilar cisterns are patent. Mild to moderate chronic small vessel ischemia with slight progression from 2017. No evidence of territorial infarct or acute ischemia. No extra-axial or intracranial fluid collection. Vascular: Atherosclerosis of skullbase vasculature without hyperdense vessel or abnormal calcification. Skull: No fracture or focal lesion. Sinuses/Orbits: Opacification of the lower right greater than left mastoid air cells, progressed from 2017. Small fluid levels in the left and right side of sphenoid sinus. No acute orbital abnormality. Bilateral cataract resection.  Other: None. IMPRESSION: 1. No acute intracranial abnormality. 2. Age related atrophy and chronic small vessel ischemia. 3. Opacification of the lower right greater than left mastoid air cells likely mastoid effusion, progressed from 2017. Small fluid levels in the left and right side of the sphenoid sinus. Electronically Signed   By: Keith Rake M.D.   On: 06/22/2020 22:36   DG Chest Port 1 View  Result Date: 07/07/2020 CLINICAL DATA:  Shortness of breath.  COVID-19 positive EXAM: PORTABLE CHEST 1 VIEW COMPARISON:  July 05, 2020. FINDINGS: Feeding tube tip is below the diaphragm. Endotracheal tube no longer present. Central catheter tip is in the superior  vena cava. No pneumothorax. Areas of ill-defined opacity present in the mid and lower lung regions with somewhat less opacity bilaterally compared to 2 days prior. Stable apical pleural thickening. Stable cardiac prominence with pulmonary vascularity normal. Sizable hiatal type hernia present. IMPRESSION: Patchy airspace opacity bilaterally, slightly less compared to 2 days prior. Stable cardiomegaly. Sizable hiatal type hernia. Feeding tube tip below diaphragm. Electronically Signed   By: Lowella Grip III M.D.   On: 07/07/2020 08:04   DG CHEST PORT 1 VIEW  Result Date: 07/05/2020 CLINICAL DATA:  Pneumonia EXAM: PORTABLE CHEST 1 VIEW COMPARISON:  Six days ago FINDINGS: Endotracheal tube with tip halfway between the clavicular heads and carina. Feeding tube at least reaches the stomach. Left PICC with tip at the SVC. Patchy bilateral pneumonia with progression the left base. No visible effusion or pneumothorax. Cardiomegaly. IMPRESSION: 1. Unremarkable hardware. 2. Multifocal pneumonia with progression at the left base. Electronically Signed   By: Monte Fantasia M.D.   On: 07/05/2020 06:14   DG CHEST PORT 1 VIEW  Result Date: 06/29/2020 CLINICAL DATA:  Intubation EXAM: PORTABLE CHEST 1 VIEW COMPARISON:  Two days ago FINDINGS: Endotracheal  tube with tip between the clavicular heads and carina. Enteric tube with side port seen over the stomach. Left PICC with tip near the SVC brachiocephalic junction. Patchy bilateral infiltrate. Cardiopericardial enlargement accentuated by rightward rotation. No visible effusion or pneumothorax. IMPRESSION: 1. Intubation with unremarkable hardware. 2. Atypical pneumonia with mild progression from 2 days ago. Electronically Signed   By: Monte Fantasia M.D.   On: 06/29/2020 05:07   DG CHEST PORT 1 VIEW  Result Date: 06/27/2020 CLINICAL DATA:  PICC in place EXAM: PORTABLE CHEST 1 VIEW COMPARISON:  06/27/2020 FINDINGS: Left arm PICC terminates in the mid SVC. Subpleural patchy opacities in the lungs bilaterally, left lower lobe predominant, compatible with pneumonia in this patient with known COVID. No pleural effusion or pneumothorax. The heart is top-normal in size. IMPRESSION: Left arm PICC terminates in the mid SVC. Multifocal pneumonia in this patient with known COVID. Electronically Signed   By: Julian Hy M.D.   On: 06/27/2020 09:35   DG Chest Port 1 View  Result Date: 06/27/2020 CLINICAL DATA:  Shortness of breath, COVID-19 positive. EXAM: PORTABLE CHEST 1 VIEW COMPARISON:  June 23, 2020. FINDINGS: Stable cardiomegaly. Stable multiple airspace opacities are noted bilaterally consistent with multifocal pneumonia. No pneumothorax or pleural effusion is noted. Bony thorax is unremarkable. IMPRESSION: Stable bilateral multifocal pneumonia. Electronically Signed   By: Marijo Conception M.D.   On: 06/27/2020 08:07   DG Chest Port 1 View  Result Date: 06/23/2020 CLINICAL DATA:  Shortness of breath and COVID. EXAM: PORTABLE CHEST 1 VIEW COMPARISON:  06/22/2020 FINDINGS: 0846 hours. Low lung volumes. Progressive patchy ground-glass and consolidative airspace opacity in the lower lungs. The cardio pericardial silhouette is enlarged. No substantial pleural effusion. The visualized bony structures of the  thorax show no acute abnormality. Telemetry leads overlie the chest. IMPRESSION: Progressive bibasilar airspace disease consistent with multifocal pneumonia. Electronically Signed   By: Misty Stanley M.D.   On: 06/23/2020 09:04   DG Chest Port 1 View  Result Date: 06/22/2020 CLINICAL DATA:  COVID, altered mental status. EXAM: PORTABLE CHEST 1 VIEW COMPARISON:  10/19/2014. FINDINGS: Low lung volumes. Subtle airspace opacities in the peripheral lung bases. No visible pleural effusions or pneumothorax. Cardiomediastinal silhouette is within normal limits. No evidence of acute osseous abnormality. IMPRESSION: Subtle airspace opacities in the peripheral lung bases,  concerning for multifocal pneumonia and consistent with reported COVID diagnosis. Electronically Signed   By: Margaretha Sheffield MD   On: 06/22/2020 11:39   DG Abd Portable 1V  Result Date: 06/28/2020 CLINICAL DATA:  Nausea, vomiting blood, COVID-19, atrial fibrillation, coronary artery disease, hypertension, former smoker EXAM: PORTABLE ABDOMEN - 1 VIEW COMPARISON:  Portable exam 1013 hours compared to CT abdomen and pelvis of 06/20/2018 FINDINGS: Scattered retained contrast in colonic diverticula and rectum. Nonobstructive bowel gas pattern. No bowel dilatation or bowel wall thickening. Bones demineralized with degenerative changes lumbar spine. IMPRESSION: Colonic diverticulosis. Electronically Signed   By: Lavonia Dana M.D.   On: 06/28/2020 10:25   DG Swallowing Func-Speech Pathology  Result Date: 06/23/2020 Objective Swallowing Evaluation: Type of Study: MBS-Modified Barium Swallow Study  Patient Details Name: KRISTINA BERTONE MRN: [id number] Date of Birth: 28-Sep-1940 Today's Date: 06/23/2020 Time: SLP Start Time (ACUTE ONLY): 50 -SLP Stop Time (ACUTE ONLY): 4098 SLP Time Calculation (min) (ACUTE ONLY): 20 min Past Medical History: Past Medical History: Diagnosis Date  Arthritis   Atrial fibrillation ([place]) 01/31/2015  Chronic gout   Coronary  artery disease 12/2007  STATUS POST STENTING OF THE LEFT ANTERIOR DESCENDING ARTERY  COVID-19   Dyslipidemia   Gout   Hiatal hernia   Hyperlipidemia   Hypertension   LBP (low back pain)   Partial small bowel obstruction ([facility])   RBBB (right bundle branch block)   SBO (small bowel obstruction) ([facility])   PARTIAL  Second degree AV block, Mobitz type I   Urinary tract infection   Escherichia coli urinary tract infection Past Surgical History: Past Surgical History: Procedure Laterality Date  CARDIAC CATHETERIZATION    HERNIA REPAIR    INGUINAL  ROTATOR CUFF REPAIR   HPI: 79 yo male presenting to ED with generalized weakness, poor food intake, and confusion. COVID positive. PMH including hiatal hernia, arthritis, A-fib, HTN, AKI, alcohol abuse, and CAD with s/p stenting of L anterior descending artery. CXR Progressive bibasilar airspace disease consistent with multifocal pneumonia.  No data recorded Assessment / Plan / Recommendation CHL IP CLINICAL IMPRESSIONS 06/23/2020 Clinical Impression Pt exhibits mild oropharyngeal dysphagia with aspiration of nectar consistency, and mild vallecular residual. 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 Pt unable to contain entire bolus in oral cavity and moderate amount nectar thick fell over tongue and was aspirated prior to initiation of laryngeal closure (frank volume) with immediate cough. Timing of closure delayed with thin using chin tuck and slight aspiraiton present. Lingual residue present falling into valleculae post swallow. Chin down posture using straw was effective with nectar however pt displays memory deficit and follow through of strategy is questionable. 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 Recommend honey thick liquids, Dys 3, crush meds. full supervision/assist. SLP will work with pt on chin tuck with nectar for possible  upgrade.  SLP Visit Diagnosis Dysphagia, oropharyngeal phase (R13.12) Attention and concentration deficit following -- Frontal lobe and executive function deficit following -- Impact on safety and function Mild aspiration risk;Moderate aspiration risk   CHL IP TREATMENT RECOMMENDATION 06/23/2020 Treatment Recommendations Therapy as outlined in treatment plan below   Prognosis 06/23/2020 Prognosis for Safe Diet Advancement (No Data) Barriers to Reach Goals Cognitive deficits Barriers/Prognosis Comment -- CHL IP DIET RECOMMENDATION 06/23/2020 SLP Diet Recommendations Dysphagia 3 (Mech soft) solids;Honey thick liquids Liquid Administration via Cup Medication Administration Crushed with puree Compensations Minimize environmental distractions;Slow  rate;Small sips/bites Postural Changes Seated upright at 90 degrees;Remain semi-upright after after feeds/meals (Comment)   CHL IP OTHER RECOMMENDATIONS 06/23/2020 Recommended Consults -- Oral Care Recommendations Oral care BID Other Recommendations --   CHL IP FOLLOW UP RECOMMENDATIONS 06/23/2020 Follow up Recommendations (No Data)   CHL IP FREQUENCY AND DURATION 06/23/2020 Speech Therapy Frequency (ACUTE ONLY) min 2x/week Treatment Duration 2 weeks      CHL IP ORAL PHASE 06/23/2020 Oral Phase Impaired Oral - Pudding Teaspoon -- Oral - Pudding Cup -- Oral - Honey Teaspoon -- Oral - Honey Cup Lingual/palatal residue;Piecemeal swallowing Oral - Nectar Teaspoon -- Oral - Nectar Cup Decreased bolus cohesion Oral - Nectar Straw Decreased bolus cohesion Oral - Thin Teaspoon -- Oral - Thin Cup -- Oral - Thin Straw -- Oral - Puree Piecemeal swallowing Oral - Mech Soft -- Oral - Regular -- Oral - Multi-Consistency -- Oral - Pill -- Oral Phase - Comment --  CHL IP PHARYNGEAL PHASE 06/23/2020 Pharyngeal Phase Impaired Pharyngeal- Pudding Teaspoon -- Pharyngeal -- Pharyngeal- Pudding Cup -- Pharyngeal -- Pharyngeal- Honey Teaspoon -- Pharyngeal -- Pharyngeal- Honey Cup Pharyngeal residue  - valleculae Pharyngeal -- Pharyngeal- Nectar Teaspoon -- Pharyngeal -- Pharyngeal- Nectar Cup Penetration/Aspiration before swallow Pharyngeal Material enters airway, passes BELOW cords and not ejected out despite cough attempt by patient Pharyngeal- Nectar Straw Penetration/Aspiration during swallow Pharyngeal Material enters airway, remains ABOVE vocal cords then ejected out;Material does not enter airway Pharyngeal- Thin Teaspoon NT Pharyngeal -- Pharyngeal- Thin Cup NT Pharyngeal -- Pharyngeal- Thin Straw Penetration/Aspiration during swallow Pharyngeal Material enters airway, passes BELOW cords without attempt by patient to eject out (silent aspiration) Pharyngeal- Puree Pharyngeal residue - valleculae Pharyngeal -- Pharyngeal- Mechanical Soft -- Pharyngeal -- Pharyngeal- Regular WFL Pharyngeal -- Pharyngeal- Multi-consistency -- Pharyngeal -- Pharyngeal- Pill -- Pharyngeal -- Pharyngeal Comment --  CHL IP CERVICAL ESOPHAGEAL PHASE 06/23/2020 Cervical Esophageal Phase WFL Pudding Teaspoon -- Pudding Cup -- Honey Teaspoon -- Honey Cup -- Nectar Teaspoon -- Nectar Cup -- Nectar Straw -- Thin Teaspoon -- Thin Cup -- Thin Straw -- Puree -- Mechanical Soft -- Regular -- Multi-consistency -- Pill -- Cervical Esophageal Comment -- [place] Siren 06/23/2020, 4:04 PM Orbie Pyo Colvin Caroli.Ed Actor Pager [phone] Office [phone]              ECHOCARDIOGRAM COMPLETE  Result Date: 06/23/2020    ECHOCARDIOGRAM REPORT   Patient Name:   REXFORD PREVO Date of Exam: 06/23/2020 Medical Rec #:  [id number]      Height:       71.0 in Accession #:    [id number]     Weight:       244.7 lb Date of Birth:  [birth year]      BSA:          2.297 m Patient Age:    32 years       BP:           170/76 mmHg Patient Gender: M              HR:           71 bpm. Exam Location:  Inpatient Procedure: 2D Echo, Cardiac Doppler and Color Doppler Indications:    R07.9* Chest pain, unspecified. Elevated troponin   History:        Patient has prior history of Echocardiogram examinations, most                 recent 02/24/2019. CAD, Abnormal ECG, Arrythmias:RBBB,  Signs/Symptoms:Hypotension; Risk Factors:Dyslipidemia. Covid 19                 positive. ETOH.  Sonographer:    Roseanna Rainbow RDCS Referring Phys: [place]  Sonographer Comments: Patient in Fowlaer's position. IMPRESSIONS  1. Left ventricular ejection fraction, by estimation, is 60 to 65%. The left ventricle has normal function. The left ventricle has no regional wall motion abnormalities. There is mild left ventricular hypertrophy. Left ventricular diastolic parameters are consistent with Grade I diastolic dysfunction (impaired relaxation).  2. Right ventricular systolic function is moderately reduced. The right ventricular size is normal. There is normal pulmonary artery systolic pressure. The estimated right ventricular systolic pressure is 48.2 mmHg. Abnormal RV, would consider PE.  3. Right atrial size was mildly dilated.  4. The mitral valve is normal in structure. No evidence of mitral valve regurgitation. No evidence of mitral stenosis.  5. The aortic valve is tricuspid. Aortic valve regurgitation is trivial. Mild aortic valve sclerosis is present, with no evidence of aortic valve stenosis.  6. The inferior vena cava is normal in size with greater than 50% respiratory variability, suggesting right atrial pressure of 3 mmHg. FINDINGS  Left Ventricle: Left ventricular ejection fraction, by estimation, is 60 to 65%. The left ventricle has normal function. The left ventricle has no regional wall motion abnormalities. The left ventricular internal cavity size was normal in size. There is  mild left ventricular hypertrophy. Left ventricular diastolic parameters are consistent with Grade I diastolic dysfunction (impaired relaxation). Right Ventricle: The right ventricular size is normal. No increase in right ventricular wall thickness.  Right ventricular systolic function is moderately reduced. There is normal pulmonary artery systolic pressure. The tricuspid regurgitant velocity is 2.75 m/s, and with an assumed right atrial pressure of 3 mmHg, the estimated right ventricular systolic pressure is 50.0 mmHg. Left Atrium: Left atrial size was normal in size. Right Atrium: Right atrial size was mildly dilated. Pericardium: There is no evidence of pericardial effusion. Mitral Valve: The mitral valve is normal in structure. No evidence of mitral valve regurgitation. No evidence of mitral valve stenosis. Tricuspid Valve: The tricuspid valve is normal in structure. Tricuspid valve regurgitation is trivial. Aortic Valve: The aortic valve is tricuspid. Aortic valve regurgitation is trivial. Mild aortic valve sclerosis is present, with no evidence of aortic valve stenosis. Pulmonic Valve: The pulmonic valve was normal in structure. Pulmonic valve regurgitation is not visualized. Aorta: The aortic root is normal in size and structure. Venous: The inferior vena cava is normal in size with greater than 50% respiratory variability, suggesting right atrial pressure of 3 mmHg. IAS/Shunts: No atrial level shunt detected by color flow Doppler.  LEFT VENTRICLE PLAX 2D LVIDd:         3.80 cm     Diastology LVIDs:         2.60 cm     LV e' medial:    4.35 cm/s LV PW:         1.70 cm     LV E/e' medial:  16.9 LV IVS:        1.50 cm     LV e' lateral:   5.33 cm/s LVOT diam:     2.30 cm     LV E/e' lateral: 13.8 LV SV:         97 LV SV Index:   42 LVOT Area:     4.15 cm  LV Volumes (MOD) LV vol d, MOD A2C: 78.7 ml LV vol  d, MOD A4C: 84.2 ml LV vol s, MOD A2C: 17.5 ml LV vol s, MOD A4C: 25.2 ml LV SV MOD A2C:     61.2 ml LV SV MOD A4C:     84.2 ml LV SV MOD BP:      64.3 ml RIGHT VENTRICLE             IVC RV S prime:     18.50 cm/s  IVC diam: 2.20 cm TAPSE (M-mode): 3.2 cm LEFT ATRIUM             Index       RIGHT ATRIUM           Index LA diam:        3.70 cm 1.61 cm/m   RA Area:     18.30 cm LA Vol (A2C):   34.1 ml 14.84 ml/m RA Volume:   53.10 ml  23.11 ml/m LA Vol (A4C):   49.7 ml 21.63 ml/m LA Biplane Vol: 43.7 ml 19.02 ml/m  AORTIC VALVE LVOT Vmax:   124.00 cm/s LVOT Vmean:  89.800 cm/s LVOT VTI:    0.233 m  AORTA Ao Root diam: 3.50 cm MITRAL VALVE               TRICUSPID VALVE MV Area (PHT): 3.21 cm    TR Peak grad:   30.2 mmHg MV Decel Time: 236 msec    TR Vmax:        275.00 cm/s MV E velocity: 73.30 cm/s MV A velocity: 99.05 cm/s  SHUNTS MV E/A ratio:  0.74        Systemic VTI:  0.23 m                            Systemic Diam: 2.30 cm Loralie Champagne MD Electronically signed by Loralie Champagne MD Signature Date/Time: 06/23/2020/4:53:51 PM    Final    [place] EKG SITE RITE  Result Date: 06/29/2020 If Site Rite image not attached, placement could not be confirmed due to current cardiac rhythm.  [place] EKG SITE RITE  Result Date: 06/25/2020 If Site Rite image not attached, placement could not be confirmed due to current cardiac rhythm.

## 2020-07-10 NOTE — Progress Notes (Signed)
Spoke with son Christen Bame, gave family update on patient, care provided.  All questions answered.

## 2020-07-11 DIAGNOSIS — G9340 Encephalopathy, unspecified: Secondary | ICD-10-CM | POA: Diagnosis not present

## 2020-07-11 DIAGNOSIS — Z515 Encounter for palliative care: Secondary | ICD-10-CM

## 2020-07-11 LAB — D-DIMER, QUANTITATIVE: D-Dimer, Quant: 2.03 ug/mL-FEU — ABNORMAL HIGH (ref 0.00–0.50)

## 2020-07-11 LAB — GLUCOSE, CAPILLARY
Glucose-Capillary: 92 mg/dL (ref 70–99)
Glucose-Capillary: 116 mg/dL — ABNORMAL HIGH (ref 70–99)
Glucose-Capillary: 107 mg/dL — ABNORMAL HIGH (ref 70–99)
Glucose-Capillary: 105 mg/dL — ABNORMAL HIGH (ref 70–99)
Glucose-Capillary: 104 mg/dL — ABNORMAL HIGH (ref 70–99)

## 2020-07-11 LAB — CBC WITH DIFFERENTIAL/PLATELET
Abs Immature Granulocytes: 0.04 10*3/uL (ref 0.00–0.07)
Basophils Absolute: 0 10*3/uL (ref 0.0–0.1)
Basophils Relative: 0 %
Eosinophils Absolute: 0.7 10*3/uL — ABNORMAL HIGH (ref 0.0–0.5)
Eosinophils Relative: 8 %
HCT: 27.2 % — ABNORMAL LOW (ref 39.0–52.0)
Hemoglobin: 8.3 g/dL — ABNORMAL LOW (ref 13.0–17.0)
Immature Granulocytes: 0 %
Lymphocytes Relative: 14 %
Lymphs Abs: 1.2 10*3/uL (ref 0.7–4.0)
MCH: 33.1 pg (ref 26.0–34.0)
MCHC: 30.5 g/dL (ref 30.0–36.0)
MCV: 108.4 fL — ABNORMAL HIGH (ref 80.0–100.0)
Monocytes Absolute: 0.5 10*3/uL (ref 0.1–1.0)
Monocytes Relative: 5 %
Neutro Abs: 6.4 10*3/uL (ref 1.7–7.7)
Neutrophils Relative %: 73 %
Platelets: 323 10*3/uL (ref 150–400)
RBC: 2.51 MIL/uL — ABNORMAL LOW (ref 4.22–5.81)
RDW: 15.9 % — ABNORMAL HIGH (ref 11.5–15.5)
WBC: 8.9 10*3/uL (ref 4.0–10.5)
nRBC: 0 % (ref 0.0–0.2)

## 2020-07-11 LAB — COMPREHENSIVE METABOLIC PANEL
ALT: 28 U/L (ref 0–44)
AST: 28 U/L (ref 15–41)
Albumin: 2.3 g/dL — ABNORMAL LOW (ref 3.5–5.0)
Alkaline Phosphatase: 66 U/L (ref 38–126)
Anion gap: 8 (ref 5–15)
BUN: 9 mg/dL (ref 8–23)
CO2: 27 mmol/L (ref 22–32)
Calcium: 9.5 mg/dL (ref 8.9–10.3)
Chloride: 105 mmol/L (ref 98–111)
Creatinine, Ser: 0.84 mg/dL (ref 0.61–1.24)
GFR, Estimated: >60 mL/min (ref >60)
Glucose, Bld: 101 mg/dL — ABNORMAL HIGH (ref 70–99)
Potassium: 3.9 mmol/L (ref 3.5–5.1)
Sodium: 140 mmol/L (ref 135–145)
Total Bilirubin: 0.4 mg/dL (ref 0.3–1.2)
Total Protein: 6 g/dL — ABNORMAL LOW (ref 6.5–8.1)

## 2020-07-11 LAB — C-REACTIVE PROTEIN: CRP: 1 mg/dL — ABNORMAL HIGH (ref <1.0)

## 2020-07-11 LAB — BRAIN NATRIURETIC PEPTIDE: B Natriuretic Peptide: 139.7 pg/mL — ABNORMAL HIGH (ref 0.0–100.0)

## 2020-07-11 MED ORDER — POTASSIUM CHLORIDE 20 MEQ PO PACK
20.0000 meq | PACK | Freq: Once | ORAL | Status: AC
  Administered 2020-07-11: 20 meq via ORAL
  Filled 2020-07-11: qty 1

## 2020-07-11 MED ORDER — GLYCOPYRROLATE 0.2 MG/ML IJ SOLN
0.1000 mg | Freq: Three times a day (TID) | INTRAMUSCULAR | Status: DC
  Administered 2020-07-11 – 2020-07-12 (×3): 0.1 mg via INTRAVENOUS
  Filled 2020-07-11 (×4): qty 1

## 2020-07-11 MED ORDER — FUROSEMIDE 40 MG PO TABS
40.0000 mg | ORAL_TABLET | Freq: Once | ORAL | Status: AC
  Administered 2020-07-11: 40 mg
  Filled 2020-07-11: qty 1

## 2020-07-11 NOTE — Progress Notes (Signed)
PROGRESS NOTE                                                                                                                                                                                                             Patient Demographics:    Gregory Wheeler, is a 79 y.o. male, DOB - 29-Jul-1941, UJW:[id number]  Outpatient Primary MD for the patient is [name], NP    LOS - 19  Admit date - 06/22/2020    Chief Complaint  Patient presents with  . Covid pos  and Not eating or drinking       Brief Narrative (HPI from H&P)   79 year old Caucasian male who was admitted for COVID-19 pneumonia despite being fully vaccinated, his COVID-66 pneumonia was actually quite limited however he went into full-blown DTs and stay was complicated by upper GI bleed for which he was seen by [facility], respiratory arrest resuscitation, critical care was consulted and he was intubated for close to a week, post extubation he is showing signs of ongoing aspiration of oral secretions, extremely frail and weak.  Now wants to be DNR.  We will continue medical treatment and monitor closely for another 24 hours in ICU.  Will involve palliative care as well.   Subjective:   Patient in bed NG tube in place, appears tired, denies any chest pain or belly pain states he is comfortable.  Unable to follow commands reliably.   Assessment  & Plan :     1. Acute Hypoxic Resp. Failure due to Acute Covid 19 Viral Pneumonitis along with aspiration pneumonia causing respiratory arrest - he finished his COVID-19 specific treatment with steroids and Remdesivir, he was briefly intubated due to respiratory arrest and finally extubated on 07/06/2020.  At this time post extubation on 07/06/2020 he seems to have difficulty controlling his oral secretions, he is tube feed dependent through NG tube, loud gurgling in the upper airway continues, extremely weak and deconditioned  with extremely poor prognosis and high chances of repeat aspiration.  Discussed with the patient he does not want to go back on the ventilator or undergo CPR, palliative care had similar discussion with same answer, palliative care had a meeting meeting with family on 07/08/2020 - now DNR, continue medical treatment.  His original COVID-19 positive date was 06/13/2020, copy of the report in  the chart, it is over 21 days and his contact precautions will be removed on 07/11/2020.  SpO2: 99 % O2 Flow Rate (L/min): 2 L/min FiO2 (%): 40 %   2.  Enterococcal bacteremia.  He is currently on 6 weeks total of IV Rocephin and ampicillin through PICC line with a tentative stop date of 08/04/2020.  Poor candidate for TEE.  He has been seen by ID this admission.  For stopping his antibiotic he has to see ID.  3.  Upper GI bleed this admission.  Seen by [name] earlier, underwent EGD on 06/30/2020 showing possible gastritis, on IV PPI, monitor H&H closely.  4.  History of CAD, right bundle branch block, paroxysmal A. fib.  Baseline on Xarelto, due to recent GI bleed currently on full dose Lovenox.  Continue beta-blocker.  5.  Urinary retention.  On Foley, resume Flomax once he is able to take pills orally.  6.  Essential hypertension currently on Norvasc, Nitropaste along with beta-blocker.  7.  Excessive oral secretions.  Added scopolamine patch, secretions have improved.  8 . Alcohol abuse with DTs earlier this admission.  Seems to be stable right now.  Counseled to quit alcohol.  9.  Dysphagia due to extreme weakness and deconditioning.  Tube feeding continued.  Speech following.  10. Third spacing of fluid and edema.  Gentle Lasix PRN with KCL.    Condition - Extremely Guarded  Family Communication  : Son [name] [phone] on 07/07/2020, clearly explained to him that his father wants to be DNR that prognosis is extremely guarded, 07/09/20, 07/10/20  Code Status :  DNR  Consults  :  Pall  Care, PCCM, GI  Procedures  :    PICC 11/1 >   ETT 11/3 >11/10  TTE -   1. Left ventricular ejection fraction, by estimation, is 60 to 65%. The left ventricle has normal function. The left ventricle has no regional wall motion abnormalities. There is mild left ventricular hypertrophy. Left ventricular diastolic parameters are consistent with Grade I diastolic dysfunction (impaired relaxation).  2. Right ventricular systolic function is moderately reduced. The right ventricular size is normal. There is normal pulmonary artery systolic pressure. The estimated right ventricular systolic pressure is 12.8 mmHg. Abnormal RV, would consider PE.  3. Right atrial size was mildly dilated.  4. The mitral valve is normal in structure. No evidence of mitral valve regurgitation. No evidence of mitral stenosis.  5. The aortic valve is tricuspid. Aortic valve regurgitation is trivial. Mild aortic valve sclerosis is present, with no evidence of aortic valve stenosis.  6. The inferior vena cava is normal in size with greater than 50% respiratory variability, suggesting right atrial pressure of 3 mmHg.  CT -  1. No acute intracranial abnormality. 2. Age related atrophy and chronic small vessel ischemia. 3. Opacification of the lower right greater than left mastoid air cells likely mastoid effusion, progressed from 2017. Small fluid levels in the left and right side of the sphenoid sinus.  PUD Prophylaxis : PPI  Disposition Plan  :    Status is: Inpatient  Remains inpatient appropriate because:IV treatments appropriate due to intensity of illness or inability to take PO   Dispo: The patient is from: Home              Anticipated d/c is to: SNF              Anticipated d/c date is: > 3 days  Patient currently is not medically stable to d/c.  DVT Prophylaxis  :  Lovenox   Lab Results  Component Value Date   PLT 323 07/11/2020    Diet :  Diet Order            Diet NPO time specified Except  for: Sips with Meds  Diet effective now                  Inpatient Medications  Scheduled Meds: . allopurinol  300 mg Per Tube QHS  . amLODipine  10 mg Per Tube Daily  . chlorhexidine gluconate (MEDLINE KIT)  15 mL Mouth Rinse BID  . Chlorhexidine Gluconate Cloth  6 each Topical Daily  . enoxaparin (LOVENOX) injection  100 mg Subcutaneous Q12H  . folic acid  1 mg Per Tube Daily  . insulin aspart  0-6 Units Subcutaneous Q4H  . mouth rinse  15 mL Mouth Rinse 10 times per day  . metoprolol tartrate  12.5 mg Per Tube BID  . multivitamin with minerals  1 tablet Per Tube Daily  . nitroGLYCERIN  1 inch Topical Q6H  . pantoprazole sodium  40 mg Per Tube BID  . scopolamine  1 patch Transdermal Q72H  . sodium chloride flush  10-40 mL Intracatheter Q12H  . thiamine  100 mg Per Tube Daily   Continuous Infusions: . ampicillin (OMNIPEN) IV 2 g (07/11/20 0859)  . cefTRIAXone (ROCEPHIN)  IV 2 g (07/11/20 0857)  . feeding supplement (VITAL 1.5 CAL) 1,000 mL (07/10/20 0245)   PRN Meds:.acetaminophen, guaiFENesin-dextromethorphan, hydrALAZINE, LORazepam, [DISCONTINUED] ondansetron **OR** ondansetron (ZOFRAN) IV, polyethylene glycol, white petrolatum  Antibiotics  :    Anti-infectives (From admission, onward)   Start     Dose/Rate Route Frequency Ordered Stop   07/01/20 1200  ampicillin (OMNIPEN) 2 g in sodium chloride 0.9 % 100 mL IVPB        2 g 300 mL/hr over 20 Minutes Intravenous Every 4 hours 07/01/20 0955     06/29/20 1201  Ampicillin-Sulbactam (UNASYN) 3 g in sodium chloride 0.9 % 100 mL IVPB  Status:  Discontinued        3 g 200 mL/hr over 30 Minutes Intravenous Every 6 hours 06/29/20 0959 07/01/20 0955   06/29/20 1045  cefTRIAXone (ROCEPHIN) 2 g in sodium chloride 0.9 % 100 mL IVPB        2 g 200 mL/hr over 30 Minutes Intravenous Every 12 hours 06/29/20 0959     06/29/20 1030  cefTRIAXone (ROCEPHIN) 2 g in sodium chloride 0.9 % 100 mL IVPB  Status:  Discontinued        2 g 200  mL/hr over 30 Minutes Intravenous Every 24 hours 06/29/20 0944 06/29/20 0959   06/29/20 0500  vancomycin (VANCOREADY) IVPB 750 mg/150 mL  Status:  Discontinued        750 mg 150 mL/hr over 60 Minutes Intravenous Every 12 hours 06/28/20 1441 06/29/20 0442   06/28/20 2030  fluconazole (DIFLUCAN) IVPB 100 mg  Status:  Discontinued        100 mg 50 mL/hr over 60 Minutes Intravenous Every 24 hours 06/28/20 1906 07/01/20 1538   06/28/20 2030  Ampicillin-Sulbactam (UNASYN) 3 g in sodium chloride 0.9 % 100 mL IVPB  Status:  Discontinued        3 g 200 mL/hr over 30 Minutes Intravenous Every 8 hours 06/28/20 1940 06/29/20 0959   06/28/20 1600  acyclovir (ZOVIRAX) 110 mg in dextrose 5 % 100 mL IVPB  Status:  Discontinued        110 mg 102.2 mL/hr over 60 Minutes Intravenous Every 8 hours 06/28/20 1441 06/28/20 1448   06/28/20 1600  acyclovir (ZOVIRAX) 900 mg in dextrose 5 % 150 mL IVPB  Status:  Discontinued        900 mg 168 mL/hr over 60 Minutes Intravenous Every 8 hours 06/28/20 1450 06/28/20 1912   06/28/20 1530  vancomycin (VANCOREADY) IVPB 1500 mg/300 mL        1,500 mg 150 mL/hr over 120 Minutes Intravenous  Once 06/28/20 1441 06/28/20 2037   06/27/20 1400  ampicillin (OMNIPEN) 2 g in sodium chloride 0.9 % 100 mL IVPB  Status:  Discontinued        2 g 300 mL/hr over 20 Minutes Intravenous Every 4 hours 06/27/20 1216 06/28/20 1437   06/25/20 1947  Ampicillin-Sulbactam (UNASYN) 3 g in sodium chloride 0.9 % 100 mL IVPB  Status:  Discontinued        3 g 200 mL/hr over 30 Minutes Intravenous Every 6 hours 06/25/20 1419 06/27/20 1218   06/24/20 2200  cefTRIAXone (ROCEPHIN) 2 g in sodium chloride 0.9 % 100 mL IVPB  Status:  Discontinued        2 g 200 mL/hr over 30 Minutes Intravenous Every 12 hours 06/24/20 1513 06/28/20 1437   06/24/20 1230  cefTRIAXone (ROCEPHIN) 2 g in sodium chloride 0.9 % 100 mL IVPB  Status:  Discontinued        2 g 200 mL/hr over 30 Minutes Intravenous Every 24 hours  06/24/20 1139 06/24/20 1513   06/23/20 1430  azithromycin (ZITHROMAX) 500 mg in sodium chloride 0.9 % 250 mL IVPB  Status:  Discontinued        500 mg 250 mL/hr over 60 Minutes Intravenous Every 24 hours 06/22/20 1406 06/22/20 1410   06/23/20 1300  Ampicillin-Sulbactam (UNASYN) 3 g in sodium chloride 0.9 % 100 mL IVPB  Status:  Discontinued        3 g 200 mL/hr over 30 Minutes Intravenous Every 8 hours 06/23/20 1131 06/25/20 1419   06/23/20 1000  remdesivir 100 mg in sodium chloride 0.9 % 100 mL IVPB        100 mg 200 mL/hr over 30 Minutes Intravenous Daily 06/22/20 1345 06/26/20 1200   06/23/20 0945  ampicillin (OMNIPEN) 2 g in sodium chloride 0.9 % 100 mL IVPB  Status:  Discontinued        2 g 300 mL/hr over 20 Minutes Intravenous Every 4 hours 06/23/20 0852 06/23/20 1131   06/22/20 1430  azithromycin (ZITHROMAX) 500 mg in sodium chloride 0.9 % 250 mL IVPB  Status:  Discontinued        500 mg 250 mL/hr over 60 Minutes Intravenous Every 24 hours 06/22/20 1410 06/23/20 0902   06/22/20 1400  remdesivir 100 mg in sodium chloride 0.9 % 100 mL IVPB        100 mg 200 mL/hr over 30 Minutes Intravenous Every 30 min 06/22/20 1345 06/22/20 1518   06/22/20 1400  cefTRIAXone (ROCEPHIN) 2 g in sodium chloride 0.9 % 100 mL IVPB  Status:  Discontinued        2 g 200 mL/hr over 30 Minutes Intravenous Every 24 hours 06/22/20 1357 06/23/20 0902   06/22/20 1400  azithromycin (ZITHROMAX) 500 mg in sodium chloride 0.9 % 250 mL IVPB  Status:  Discontinued        500 mg 250 mL/hr over 60 Minutes Intravenous Every 24 hours  06/22/20 1357 06/22/20 1406       Time Spent in minutes  30   Lala Lund M.D on 07/11/2020 at 11:40 AM  To page go to [email or web address] - password [facility]  [facility] -  Office  [phone]   See all Orders from today for further details    Objective:   Vitals:   07/10/20 1937 07/10/20 2345 07/11/20 0420 07/11/20 0500  BP: (!) 142/57 (!) 162/64 101/60   Pulse: 90 93 88    Resp: (!) 22 (!) 22 19   Temp: 98.6 F (37 C) 98.3 F (36.8 C) 99.1 F (37.3 C)   TempSrc: Axillary Axillary Axillary   SpO2: 99% 96% 99%   Weight:    103.2 kg  Height:        Wt Readings from Last 3 Encounters:  07/11/20 103.2 kg  03/14/20 110.3 kg  09/17/19 106.6 kg     Intake/Output Summary (Last 24 hours) at 07/11/2020 1140 Last data filed at 07/11/2020 0534 Gross per 24 hour  Intake --  Output 4400 ml  Net -4400 ml     Physical Exam  Awake but appears extremely weak, NG tube in place, with Scopolamine patch oral secretions have reduced and so is gurgling, still drooling from the mouth and likely unable to control his oral secretions [place].AT,PERRAL Supple Neck,No JVD, No cervical lymphadenopathy appriciated.  Symmetrical Chest wall movement, Good air movement bilaterally, CTAB RRR,No Gallops, Rubs or new Murmurs, No Parasternal Heave +ve B.Sounds, Abd Soft, No tenderness, No organomegaly appriciated, No rebound - guarding or rigidity. No Cyanosis, Clubbing or edema, No new Rash or bruise     Data Review:    CBC Recent Labs  Lab 07/07/20 0515 07/08/20 0407 07/09/20 0531 07/10/20 0356 07/11/20 0429  WBC 10.6* 9.1 8.0 8.6 8.9  HGB 8.5* 7.9* 7.7* 8.2* 8.3*  HCT 26.8* 25.6* 24.8* 26.3* 27.2*  PLT 177 193 224 276 323  MCV 106.3* 107.6* 108.8* 107.3* 108.4*  MCH 33.7 33.2 33.8 33.5 33.1  MCHC 31.7 30.9 31.0 31.2 30.5  RDW 15.4 15.5 15.3 15.7* 15.9*  LYMPHSABS  --  1.1 0.8 1.1 1.2  MONOABS  --  0.6 0.5 0.5 0.5  EOSABS  --  0.4 0.5 0.6* 0.7*  BASOSABS  --  0.0 0.0 0.0 0.0    Recent Labs  Lab 07/05/20 0500 07/05/20 0500 07/06/20 0500 07/06/20 0500 07/07/20 0515 07/07/20 0711 07/08/20 0407 07/08/20 0408 07/09/20 0531 07/10/20 0356 07/11/20 0429 07/11/20 0430  NA 142   < > 142   < > 142  --  142  --  141 142 140  --   K 3.8   < > 4.2   < > 4.4  --  4.4  --  3.9 3.7 3.9  --   CL 105   < > 101   < > 104  --  104  --  106 106 105  --   CO2 28   <  > 30   < > 28  --  27  --  _0 --   GLUCOSE 139*   < > 100*   < > 100*  --  110*  --  109* 109* 101*  --   BUN 12   < > 12   < > 13  --  14  --  _1 --   CREATININE 0.82   < > 0.87   < > 0.92  --  0.94  --  0.88 0.89 0.84  --   CALCIUM 8.8*   < > 9.6   < > 9.6  --  9.7  --  9.5 9.5 9.5  --   AST  --   --   --   --   --   --  46*  --  35 31 28  --   ALT  --   --   --   --   --   --  32  --  _0 --   ALKPHOS  --   --   --   --   --   --  64  --  61 64 66  --   BILITOT  --   --   --   --   --   --  0.5  --  0.4 0.6 0.4  --   ALBUMIN  --   --   --   --   --   --  2.0*  --  2.1* 2.2* 2.3*  --   MG 1.6*  --  1.6*  --  1.8  --   --   --   --   --   --   --   CRP  --   --   --   --   --   --  2.5*  --  1.2* 0.9 1.0*  --   DDIMER  --   --   --   --   --   --  1.86*  --  1.88* 1.94* 2.03*  --   PROCALCITON  --   --   --   --  0.30  --  0.27  --   --   --   --   --   BNP  --   --   --   --   --  464.9*  --  255.3* 344.4* 197.7*  --  139.7*   < > = values in this interval not displayed.    ------------------------------------------------------------------------------------------------------------------ No results for input(s): CHOL, HDL, LDLCALC, TRIG, CHOLHDL, LDLDIRECT in the last 72 hours.  Lab Results  Component Value Date   HGBA1C 5.9 (H) 06/29/2020   ------------------------------------------------------------------------------------------------------------------ No results for input(s): TSH, T4TOTAL, T3FREE, THYROIDAB in the last 72 hours.  Invalid input(s): FREET3  Cardiac Enzymes No results for input(s): CKMB, TROPONINI, MYOGLOBIN in the last 168 hours.  Invalid input(s): CK ------------------------------------------------------------------------------------------------------------------    Component Value Date/Time   BNP 139.7 (H) 07/11/2020 0430    Micro Results Recent Results (from the past 240 hour(s))  Culture, Urine     Status: None   Collection Time:  07/07/20  8:55 AM   Specimen: Urine, Random  Result Value Ref Range Status   Specimen Description URINE, RANDOM  Final   Special Requests NONE  Final   Culture   Final    NO GROWTH Performed at [facility], [address], [place], [place] [address]    Report Status 07/09/2020 FINAL  Final    Radiology Reports DG Abd 1 View  Result Date: 06/29/2020 CLINICAL DATA:  OG tube placement. EXAM: ABDOMEN - 1 VIEW COMPARISON:  Abdominal radiographs June 28, 2020, chest radiograph June 27, 2020 FINDINGS: Orogastric tube courses below the diaphragm with the tip projecting in the expected region of the stomach. Side port is below the gastroesophageal junction. Partially imaged airspace opacities in the lung bases, better characterized on recent chest radiograph. Partially imaged scattered retained  contrast within colonic diverticula in the upper abdomen. IMPRESSION: 1. Orogastric tube courses below the diaphragm with the tip projecting in the expected region of the stomach. 2. Bibasilar airspace opacities. Electronically Signed   By: Margaretha Sheffield MD   On: 06/29/2020 11:21   CT HEAD WO CONTRAST  Result Date: 06/30/2020 CLINICAL DATA:  Unresponsive since respiratory arrest yesterday EXAM: CT HEAD WITHOUT CONTRAST TECHNIQUE: Contiguous axial images were obtained from the base of the skull through the vertex without intravenous contrast. COMPARISON:  CT 06/22/2020 FINDINGS: Brain: No evidence of acute infarction, hemorrhage, hydrocephalus, extra-axial collection, visible mass lesion or mass effect. Symmetric prominence of the ventricles, cisterns and sulci compatible with parenchymal volume loss. Mixed patchy and confluent areas of white matter hypoattenuation are most compatible with chronic microvascular angiopathy. Vascular: Atherosclerotic calcification of the carotid siphons and intradural vertebral arteries. No hyperdense vessel. Skull: No scalp swelling or hematoma. No calvarial fracture  or suspicious osseous lesion. Sinuses/Orbits: Minimal thickening in the ethmoids and maxillary sinuses with pneumatized secretions and layering air-fluid levels in the sphenoid sinuses possibly related to instrumentation. Bilateral mastoid effusions are present as well. Middle ear cavities are clear. Orbital structures are unremarkable aside from prior lens extractions. Other: None. IMPRESSION: 1. No CT evidence of acute large territory or cortically based infarct nor other acute intracranial abnormality. If there is persisting concern for acute infarction, MRI is more sensitive and specific for early features of ischemia. 2. Moderate parenchymal volume loss and chronic microvascular angiopathy. 3. Mural thickening in the paranasal sinuses with layering fluid in the sphenoid sinuses and bilateral mastoid effusions. Possibly related to instrumentation/intubation. Electronically Signed   By: Lovena Le M.D.   On: 06/30/2020 01:44   CT HEAD WO CONTRAST  Result Date: 06/22/2020 CLINICAL DATA:  Delirium. EXAM: CT HEAD WITHOUT CONTRAST TECHNIQUE: Contiguous axial images were obtained from the base of the skull through the vertex without intravenous contrast. COMPARISON:  Head CT 05/14/2016 FINDINGS: Brain: No intracranial hemorrhage, mass effect, or midline shift. Age related atrophy. No hydrocephalus. The basilar cisterns are patent. Mild to moderate chronic small vessel ischemia with slight progression from 2017. No evidence of territorial infarct or acute ischemia. No extra-axial or intracranial fluid collection. Vascular: Atherosclerosis of skullbase vasculature without hyperdense vessel or abnormal calcification. Skull: No fracture or focal lesion. Sinuses/Orbits: Opacification of the lower right greater than left mastoid air cells, progressed from 2017. Small fluid levels in the left and right side of sphenoid sinus. No acute orbital abnormality. Bilateral cataract resection. Other: None. IMPRESSION: 1. No  acute intracranial abnormality. 2. Age related atrophy and chronic small vessel ischemia. 3. Opacification of the lower right greater than left mastoid air cells likely mastoid effusion, progressed from 2017. Small fluid levels in the left and right side of the sphenoid sinus. Electronically Signed   By: Keith Rake M.D.   On: 06/22/2020 22:36   DG Chest Port 1 View  Result Date: 07/07/2020 CLINICAL DATA:  Shortness of breath.  COVID-19 positive EXAM: PORTABLE CHEST 1 VIEW COMPARISON:  July 05, 2020. FINDINGS: Feeding tube tip is below the diaphragm. Endotracheal tube no longer present. Central catheter tip is in the superior vena cava. No pneumothorax. Areas of ill-defined opacity present in the mid and lower lung regions with somewhat less opacity bilaterally compared to 2 days prior. Stable apical pleural thickening. Stable cardiac prominence with pulmonary vascularity normal. Sizable hiatal type hernia present. IMPRESSION: Patchy airspace opacity bilaterally, slightly less compared to 2 days prior. Stable  cardiomegaly. Sizable hiatal type hernia. Feeding tube tip below diaphragm. Electronically Signed   By: Lowella Grip III M.D.   On: 07/07/2020 08:04   DG CHEST PORT 1 VIEW  Result Date: 07/05/2020 CLINICAL DATA:  Pneumonia EXAM: PORTABLE CHEST 1 VIEW COMPARISON:  Six days ago FINDINGS: Endotracheal tube with tip halfway between the clavicular heads and carina. Feeding tube at least reaches the stomach. Left PICC with tip at the SVC. Patchy bilateral pneumonia with progression the left base. No visible effusion or pneumothorax. Cardiomegaly. IMPRESSION: 1. Unremarkable hardware. 2. Multifocal pneumonia with progression at the left base. Electronically Signed   By: Monte Fantasia M.D.   On: 07/05/2020 06:14   DG CHEST PORT 1 VIEW  Result Date: 06/29/2020 CLINICAL DATA:  Intubation EXAM: PORTABLE CHEST 1 VIEW COMPARISON:  Two days ago FINDINGS: Endotracheal tube with tip between the  clavicular heads and carina. Enteric tube with side port seen over the stomach. Left PICC with tip near the SVC brachiocephalic junction. Patchy bilateral infiltrate. Cardiopericardial enlargement accentuated by rightward rotation. No visible effusion or pneumothorax. IMPRESSION: 1. Intubation with unremarkable hardware. 2. Atypical pneumonia with mild progression from 2 days ago. Electronically Signed   By: Monte Fantasia M.D.   On: 06/29/2020 05:07   DG CHEST PORT 1 VIEW  Result Date: 06/27/2020 CLINICAL DATA:  PICC in place EXAM: PORTABLE CHEST 1 VIEW COMPARISON:  06/27/2020 FINDINGS: Left arm PICC terminates in the mid SVC. Subpleural patchy opacities in the lungs bilaterally, left lower lobe predominant, compatible with pneumonia in this patient with known COVID. No pleural effusion or pneumothorax. The heart is top-normal in size. IMPRESSION: Left arm PICC terminates in the mid SVC. Multifocal pneumonia in this patient with known COVID. Electronically Signed   By: Julian Hy M.D.   On: 06/27/2020 09:35   DG Chest Port 1 View  Result Date: 06/27/2020 CLINICAL DATA:  Shortness of breath, COVID-19 positive. EXAM: PORTABLE CHEST 1 VIEW COMPARISON:  June 23, 2020. FINDINGS: Stable cardiomegaly. Stable multiple airspace opacities are noted bilaterally consistent with multifocal pneumonia. No pneumothorax or pleural effusion is noted. Bony thorax is unremarkable. IMPRESSION: Stable bilateral multifocal pneumonia. Electronically Signed   By: Marijo Conception M.D.   On: 06/27/2020 08:07   DG Chest Port 1 View  Result Date: 06/23/2020 CLINICAL DATA:  Shortness of breath and COVID. EXAM: PORTABLE CHEST 1 VIEW COMPARISON:  06/22/2020 FINDINGS: 0846 hours. Low lung volumes. Progressive patchy ground-glass and consolidative airspace opacity in the lower lungs. The cardio pericardial silhouette is enlarged. No substantial pleural effusion. The visualized bony structures of the thorax show no acute  abnormality. Telemetry leads overlie the chest. IMPRESSION: Progressive bibasilar airspace disease consistent with multifocal pneumonia. Electronically Signed   By: Misty Stanley M.D.   On: 06/23/2020 09:04   DG Chest Port 1 View  Result Date: 06/22/2020 CLINICAL DATA:  COVID, altered mental status. EXAM: PORTABLE CHEST 1 VIEW COMPARISON:  10/19/2014. FINDINGS: Low lung volumes. Subtle airspace opacities in the peripheral lung bases. No visible pleural effusions or pneumothorax. Cardiomediastinal silhouette is within normal limits. No evidence of acute osseous abnormality. IMPRESSION: Subtle airspace opacities in the peripheral lung bases, concerning for multifocal pneumonia and consistent with reported COVID diagnosis. Electronically Signed   By: Margaretha Sheffield MD   On: 06/22/2020 11:39   DG Abd Portable 1V  Result Date: 06/28/2020 CLINICAL DATA:  Nausea, vomiting blood, COVID-19, atrial fibrillation, coronary artery disease, hypertension, former smoker EXAM: PORTABLE ABDOMEN - 1 VIEW  COMPARISON:  Portable exam 1013 hours compared to CT abdomen and pelvis of 06/20/2018 FINDINGS: Scattered retained contrast in colonic diverticula and rectum. Nonobstructive bowel gas pattern. No bowel dilatation or bowel wall thickening. Bones demineralized with degenerative changes lumbar spine. IMPRESSION: Colonic diverticulosis. Electronically Signed   By: Lavonia Dana M.D.   On: 06/28/2020 10:25   DG Swallowing Func-Speech Pathology  Result Date: 06/23/2020 Objective Swallowing Evaluation: Type of Study: MBS-Modified Barium Swallow Study  Patient Details Name: HYLAND MOLLENKOPF MRN: [id number] Date of Birth: July 26, 1941 Today's Date: 06/23/2020 Time: SLP Start Time (ACUTE ONLY): 1417 -SLP Stop Time (ACUTE ONLY): 9417 SLP Time Calculation (min) (ACUTE ONLY): 20 min Past Medical History: Past Medical History: Diagnosis Date . Arthritis  . Atrial fibrillation ([place]) 01/31/2015 . Chronic gout  . Coronary artery disease 12/2007   STATUS POST STENTING OF THE LEFT ANTERIOR DESCENDING ARTERY . COVID-19  . Dyslipidemia  . Gout  . Hiatal hernia  . Hyperlipidemia  . Hypertension  . LBP (low back pain)  . Partial small bowel obstruction ([place])  . RBBB (right bundle branch block)  . SBO (small bowel obstruction) ([facility])   PARTIAL . Second degree AV block, Mobitz type I  . Urinary tract infection   Escherichia coli urinary tract infection Past Surgical History: Past Surgical History: Procedure Laterality Date . CARDIAC CATHETERIZATION   . HERNIA REPAIR    INGUINAL . ROTATOR CUFF REPAIR   HPI: 79 yo male presenting to ED with generalized weakness, poor food intake, and confusion. COVID positive. PMH including hiatal hernia, arthritis, A-fib, HTN, AKI, alcohol abuse, and CAD with s/p stenting of L anterior descending artery. CXR Progressive bibasilar airspace disease consistent with multifocal pneumonia.  No data recorded Assessment / Plan / Recommendation CHL IP CLINICAL IMPRESSIONS 06/23/2020 Clinical Impression Pt exhibits mild oropharyngeal dysphagia with aspiration of nectar consistency, and mild vallecular residual. 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 Pt unable to contain entire bolus in oral cavity and moderate amount nectar thick fell over tongue and was aspirated prior to initiation of laryngeal closure (frank volume) with immediate cough. Timing of closure delayed with thin using chin tuck and slight aspiraiton present. Lingual residue present falling into valleculae post swallow. Chin down posture using straw was effective with nectar however pt displays memory deficit and follow through of strategy is questionable. 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 Recommend honey thick liquids, Dys 3, crush meds. full supervision/assist. SLP will work with pt on chin tuck with nectar for possible upgrade.  SLP Visit  Diagnosis Dysphagia, oropharyngeal phase (R13.12) Attention and concentration deficit following -- Frontal lobe and executive function deficit following -- Impact on safety and function Mild aspiration risk;Moderate aspiration risk   CHL IP TREATMENT RECOMMENDATION 06/23/2020 Treatment Recommendations Therapy as outlined in treatment plan below   Prognosis 06/23/2020 Prognosis for Safe Diet Advancement (No Data) Barriers to Reach Goals Cognitive deficits Barriers/Prognosis Comment -- CHL IP DIET RECOMMENDATION 06/23/2020 SLP Diet Recommendations Dysphagia 3 (Mech soft) solids;Honey thick liquids Liquid Administration via Cup Medication Administration Crushed with puree Compensations Minimize environmental distractions;Slow rate;Small sips/bites Postural Changes Seated upright at 90 degrees;Remain semi-upright after after feeds/meals (Comment)   CHL IP OTHER RECOMMENDATIONS 06/23/2020 Recommended Consults -- Oral Care Recommendations Oral care BID Other Recommendations --   CHL IP FOLLOW UP RECOMMENDATIONS 06/23/2020 Follow up Recommendations (No Data)   CHL IP FREQUENCY AND DURATION 06/23/2020 Speech  Therapy Frequency (ACUTE ONLY) min 2x/week Treatment Duration 2 weeks      CHL IP ORAL PHASE 06/23/2020 Oral Phase Impaired Oral - Pudding Teaspoon -- Oral - Pudding Cup -- Oral - Honey Teaspoon -- Oral - Honey Cup Lingual/palatal residue;Piecemeal swallowing Oral - Nectar Teaspoon -- Oral - Nectar Cup Decreased bolus cohesion Oral - Nectar Straw Decreased bolus cohesion Oral - Thin Teaspoon -- Oral - Thin Cup -- Oral - Thin Straw -- Oral - Puree Piecemeal swallowing Oral - Mech Soft -- Oral - Regular -- Oral - Multi-Consistency -- Oral - Pill -- Oral Phase - Comment --  CHL IP PHARYNGEAL PHASE 06/23/2020 Pharyngeal Phase Impaired Pharyngeal- Pudding Teaspoon -- Pharyngeal -- Pharyngeal- Pudding Cup -- Pharyngeal -- Pharyngeal- Honey Teaspoon -- Pharyngeal -- Pharyngeal- Honey Cup Pharyngeal residue - valleculae  Pharyngeal -- Pharyngeal- Nectar Teaspoon -- Pharyngeal -- Pharyngeal- Nectar Cup Penetration/Aspiration before swallow Pharyngeal Material enters airway, passes BELOW cords and not ejected out despite cough attempt by patient Pharyngeal- Nectar Straw Penetration/Aspiration during swallow Pharyngeal Material enters airway, remains ABOVE vocal cords then ejected out;Material does not enter airway Pharyngeal- Thin Teaspoon NT Pharyngeal -- Pharyngeal- Thin Cup NT Pharyngeal -- Pharyngeal- Thin Straw Penetration/Aspiration during swallow Pharyngeal Material enters airway, passes BELOW cords without attempt by patient to eject out (silent aspiration) Pharyngeal- Puree Pharyngeal residue - valleculae Pharyngeal -- Pharyngeal- Mechanical Soft -- Pharyngeal -- Pharyngeal- Regular WFL Pharyngeal -- Pharyngeal- Multi-consistency -- Pharyngeal -- Pharyngeal- Pill -- Pharyngeal -- Pharyngeal Comment --  CHL IP CERVICAL ESOPHAGEAL PHASE 06/23/2020 Cervical Esophageal Phase WFL Pudding Teaspoon -- Pudding Cup -- Honey Teaspoon -- Honey Cup -- Nectar Teaspoon -- Nectar Cup -- Nectar Straw -- Thin Teaspoon -- Thin Cup -- Thin Straw -- Puree -- Mechanical Soft -- Regular -- Multi-consistency -- Pill -- Cervical Esophageal Comment -- [place] Siren 06/23/2020, 4:04 PM Orbie Pyo Colvin Caroli.Ed Actor Pager [phone] Office [phone]              ECHOCARDIOGRAM COMPLETE  Result Date: 06/23/2020    ECHOCARDIOGRAM REPORT   Patient Name:   ARAF CLUGSTON Date of Exam: 06/23/2020 Medical Rec #:  [id number]      Height:       71.0 in Accession #:    [id number]     Weight:       244.7 lb Date of Birth:  1940/10/07      BSA:          2.297 m Patient Age:    46 years       BP:           170/76 mmHg Patient Gender: M              HR:           71 bpm. Exam Location:  Inpatient Procedure: 2D Echo, Cardiac Doppler and Color Doppler Indications:    R07.9* Chest pain, unspecified. Elevated troponin  History:         Patient has prior history of Echocardiogram examinations, most                 recent 02/24/2019. CAD, Abnormal ECG, Arrythmias:RBBB,                 Signs/Symptoms:Hypotension; Risk Factors:Dyslipidemia. Covid 19                 positive. ETOH.  Sonographer:    Roseanna Rainbow RDCS Referring Phys: [place]  Sonographer Comments:  Patient in Fowlaer's position. IMPRESSIONS  1. Left ventricular ejection fraction, by estimation, is 60 to 65%. The left ventricle has normal function. The left ventricle has no regional wall motion abnormalities. There is mild left ventricular hypertrophy. Left ventricular diastolic parameters are consistent with Grade I diastolic dysfunction (impaired relaxation).  2. Right ventricular systolic function is moderately reduced. The right ventricular size is normal. There is normal pulmonary artery systolic pressure. The estimated right ventricular systolic pressure is 10.2 mmHg. Abnormal RV, would consider PE.  3. Right atrial size was mildly dilated.  4. The mitral valve is normal in structure. No evidence of mitral valve regurgitation. No evidence of mitral stenosis.  5. The aortic valve is tricuspid. Aortic valve regurgitation is trivial. Mild aortic valve sclerosis is present, with no evidence of aortic valve stenosis.  6. The inferior vena cava is normal in size with greater than 50% respiratory variability, suggesting right atrial pressure of 3 mmHg. FINDINGS  Left Ventricle: Left ventricular ejection fraction, by estimation, is 60 to 65%. The left ventricle has normal function. The left ventricle has no regional wall motion abnormalities. The left ventricular internal cavity size was normal in size. There is  mild left ventricular hypertrophy. Left ventricular diastolic parameters are consistent with Grade I diastolic dysfunction (impaired relaxation). Right Ventricle: The right ventricular size is normal. No increase in right ventricular wall thickness. Right ventricular  systolic function is moderately reduced. There is normal pulmonary artery systolic pressure. The tricuspid regurgitant velocity is 2.75 m/s, and with an assumed right atrial pressure of 3 mmHg, the estimated right ventricular systolic pressure is 72.5 mmHg. Left Atrium: Left atrial size was normal in size. Right Atrium: Right atrial size was mildly dilated. Pericardium: There is no evidence of pericardial effusion. Mitral Valve: The mitral valve is normal in structure. No evidence of mitral valve regurgitation. No evidence of mitral valve stenosis. Tricuspid Valve: The tricuspid valve is normal in structure. Tricuspid valve regurgitation is trivial. Aortic Valve: The aortic valve is tricuspid. Aortic valve regurgitation is trivial. Mild aortic valve sclerosis is present, with no evidence of aortic valve stenosis. Pulmonic Valve: The pulmonic valve was normal in structure. Pulmonic valve regurgitation is not visualized. Aorta: The aortic root is normal in size and structure. Venous: The inferior vena cava is normal in size with greater than 50% respiratory variability, suggesting right atrial pressure of 3 mmHg. IAS/Shunts: No atrial level shunt detected by color flow Doppler.  LEFT VENTRICLE PLAX 2D LVIDd:         3.80 cm     Diastology LVIDs:         2.60 cm     LV e' medial:    4.35 cm/s LV PW:         1.70 cm     LV E/e' medial:  16.9 LV IVS:        1.50 cm     LV e' lateral:   5.33 cm/s LVOT diam:     2.30 cm     LV E/e' lateral: 13.8 LV SV:         97 LV SV Index:   42 LVOT Area:     4.15 cm  LV Volumes (MOD) LV vol d, MOD A2C: 78.7 ml LV vol d, MOD A4C: 84.2 ml LV vol s, MOD A2C: 17.5 ml LV vol s, MOD A4C: 25.2 ml LV SV MOD A2C:     61.2 ml LV SV MOD A4C:     84.2 ml  LV SV MOD BP:      64.3 ml RIGHT VENTRICLE             IVC RV S prime:     18.50 cm/s  IVC diam: 2.20 cm TAPSE (M-mode): 3.2 cm LEFT ATRIUM             Index       RIGHT ATRIUM           Index LA diam:        3.70 cm 1.61 cm/m  RA Area:      18.30 cm LA Vol (A2C):   34.1 ml 14.84 ml/m RA Volume:   53.10 ml  23.11 ml/m LA Vol (A4C):   49.7 ml 21.63 ml/m LA Biplane Vol: 43.7 ml 19.02 ml/m  AORTIC VALVE LVOT Vmax:   124.00 cm/s LVOT Vmean:  89.800 cm/s LVOT VTI:    0.233 m  AORTA Ao Root diam: 3.50 cm MITRAL VALVE               TRICUSPID VALVE MV Area (PHT): 3.21 cm    TR Peak grad:   30.2 mmHg MV Decel Time: 236 msec    TR Vmax:        275.00 cm/s MV E velocity: 73.30 cm/s MV A velocity: 99.05 cm/s  SHUNTS MV E/A ratio:  0.74        Systemic VTI:  0.23 m                            Systemic Diam: 2.30 cm Loralie Champagne MD Electronically signed by Loralie Champagne MD Signature Date/Time: 06/23/2020/4:53:51 PM    Final    [place] EKG SITE RITE  Result Date: 06/29/2020 If Site Rite image not attached, placement could not be confirmed due to current cardiac rhythm.  [place] EKG SITE RITE  Result Date: 06/25/2020 If Site Rite image not attached, placement could not be confirmed due to current cardiac rhythm.

## 2020-07-11 NOTE — Progress Notes (Signed)
CSW received request to contact patient's son. CSW spoke with [place], but he was at lunch and stated he would call CSW back at at later time. He requested staff let him know if patient is moved to a different unit.   Roxanne Orner LCSW

## 2020-07-11 NOTE — Progress Notes (Signed)
Patient transferred to 5W 36. Report given to Alycia Rossetti, Charity fundraiser. Son, Sheridan, notified of room change.

## 2020-07-11 NOTE — Progress Notes (Signed)
Patient has a red MEWs score. Pt is in no distress. Respirations and heart rate are up. Tobi Bastos the charge nurse was notified. Will continue to check VS more frequently according to the red MEWs guidelines.

## 2020-07-11 NOTE — Progress Notes (Signed)
  Speech Language Pathology Treatment: Dysphagia  Patient Details Name: Gregory Wheeler MRN: [id number] DOB: 1941/02/15 Today's Date: 07/11/2020 Time: 9937-1696 SLP Time Calculation (min) (ACUTE ONLY): 20 min  Assessment / Plan / Recommendation Clinical Impression  Continues to be withdrawing and on arrival pt reaching into air, trying to sit up and anxious. Pt has severe xerostomia from NPO status and noted to have copious secretions diffusely on tongue, soft and hard palate. Dedridement with assist of ice chip removed sheet of dried mucous covering the entirety of his hard palate. Removed majority from tongue and soft palate. Oral delay with ice chip and 1/2 teaspoon water- no observable swallow initiated and no further trials provided. Notified RN or oral debris/removal and recommend frequent oral care- SLP ordered the oral care protocol. SLP will likely incorporate ice chips with nursing into treatment plan for moisture and prevent disuse atrophy with swallowing. Prognosis for return to po's uncertain with his cognition and ETOH withdraws.    HPI HPI: 80 yo male presenting to ED with generalized weakness, poor food intake, and confusion. COVID positive. PMH including hiatal hernia, arthritis, A-fib, HTN, AKI, alcohol abuse, and CAD with s/p stenting of L anterior descending artery. Intubated 11/3-11/9 due to respiratory failure in setting of worsening delirium and likely aspiration event. CXR with multifocal pneumonia with progression at the left base.  Pt was intubated from  06/29/20 - 07/05/20.        SLP Plan  Continue with current plan of care       Recommendations  Diet recommendations: NPO Medication Administration: Via alternative means                Oral Care Recommendations: Oral care QID;Staff/trained caregiver to provide oral care Follow up Recommendations: Skilled Nursing facility SLP Visit Diagnosis: Dysphagia, oropharyngeal phase (R13.12) Plan: Continue with current  plan of care       GO                Royce Macadamia 07/11/2020, 3:24 PM  Breck Coons Lola Czerwonka M.Ed Nurse, children's [phone] Office [phone]

## 2020-07-11 NOTE — Progress Notes (Addendum)
Daily Progress Note   Patient Name: Gregory Wheeler       Date: 07/11/2020 DOB: 09-Sep-1940  Age: 79 y.o. MRN#: [id number] Attending Physician: Thurnell Lose, MD Primary Care Physician: [name], NP Admit Date: 06/22/2020  Reason for Consultation/Follow-up: To discuss complex medical decision making related to patient's goals of care  Subjective: Met with patient at bedside.  Son, [name] happens to be here visiting.  [name] is happy that his father's mental status seems improved.  Patient makes eye contact and attempts to respond to questions.  His voice is a whisper and a mumble.  [name] is much better at interpreting his responses than I am.  Patient states his "balls" hurt.  He has a rectal tube in place which is likely not comfortable.  [name] asked about turning down the oxygen.  Patient was on two liters.  I turned it off while we stood at bedside and chatted.  After about 5 min the patient trended down from 97 to 93.  I turned it back to two liters prior to leaving the room.   He maintained mildly increased work of breathing and accessory muscle use the entire time.  Discussed the need for intensive rehab with [facility].  He mentioned that his mother had been in the hospital for 1 month last year and went to [facility] afterward.  The family was very pleased with [facility].  His mother improved and came home.   Assessment: 79 yo male.  Prolonged COVID course.  Comfortable on 2L of oxygen.  Mumbles in a whisper.  Not yet able to safely eat per SLP. Secretions do not appear to be a problem at this moment.  Mental status improving per son.  Severely debilitated.     Patient Profile/HPI:  79 y.o. male  with past medical history of coronary artery disease, gout, heavy alcohol use, atrial  fib, admitted on 06/22/2020 with mild Covid pneumonia, aspiration pneumonia, bacteremia with Enterococcus.  Admission has been complicated by delirium tremens and upper GI bleed resulting in intubation on November 3.  He was extubated on November 9.  He is extremely weak, unable to control copious secretions.  Appears to continue to be aspirating.  Palliative medicine consulted for goals of care.    Length of Stay: 19   Vital Signs: BP 129/69 (  BP Location: Right Arm)   Pulse 98   Temp 99.3 F (37.4 C) (Axillary)   Resp (!) 24   Ht _0  (1.803 m)   Wt 103.2 kg   SpO2 97%   BMI 31.73 kg/m  SpO2: SpO2: 97 % O2 Device: O2 Device: Nasal Cannula O2 Flow Rate: O2 Flow Rate (L/min): 2 L/min       Palliative Assessment/Data: 20%     Palliative Care Plan    Recommendations/Plan:  PMT will continue to follow with you.    Will switch scopolamine patch for robinul injection in hopes of reducing potential for increased encephalopathy.  Robinul does not cross the BBB.  Need to complete MOST form with family.  Code Status:  DNR   Prognosis: extremely guarded.  Patient is at high risk for acute decline or death.  He is severely debilitated and deconditioned with an inability to eat safely.   Discharge Planning:  To Be Determined  Care plan was discussed with son at bedside.  Thank you for allowing the Palliative Medicine Team to assist in the care of this patient.  Total time spent:  25 min.     Greater than 50%  of this time was spent counseling and coordinating care related to the above assessment and plan.  Florentina Jenny, PA-C Palliative Medicine  Please contact Palliative MedicineTeam phone at [phone] for questions and concerns between 7 am - 7 pm.   Please see AMION for individual provider pager numbers.

## 2020-07-11 NOTE — Progress Notes (Signed)
Patient's son faxed pt's initial covid positive test which was dated for 06/13/2020. Test can be found under media in chart review. Dr. Thedore Mins notified. Isolation precautions will be lifted.

## 2020-07-12 ENCOUNTER — Inpatient Hospital Stay (HOSPITAL_COMMUNITY): Payer: Medicare Other

## 2020-07-12 DIAGNOSIS — R092 Respiratory arrest: Secondary | ICD-10-CM

## 2020-07-12 LAB — GLUCOSE, CAPILLARY
Glucose-Capillary: 106 mg/dL — ABNORMAL HIGH (ref 70–99)
Glucose-Capillary: 102 mg/dL — ABNORMAL HIGH (ref 70–99)
Glucose-Capillary: 99 mg/dL (ref 70–99)
Glucose-Capillary: 111 mg/dL — ABNORMAL HIGH (ref 70–99)

## 2020-07-12 LAB — PROCALCITONIN: Procalcitonin: 0.2 ng/mL

## 2020-07-12 MED ORDER — HALOPERIDOL 0.5 MG PO TABS
0.5000 mg | ORAL_TABLET | ORAL | Status: DC | PRN
  Filled 2020-07-12: qty 1

## 2020-07-12 MED ORDER — ONDANSETRON HCL 4 MG/2ML IJ SOLN: 4.0000 mg | Freq: Four times a day (QID) | INTRAMUSCULAR | Status: DC | PRN

## 2020-07-12 MED ORDER — GLYCOPYRROLATE 0.2 MG/ML IJ SOLN: 0.2000 mg | INTRAMUSCULAR | Status: DC | PRN

## 2020-07-12 MED ORDER — POLYVINYL ALCOHOL 1.4 % OP SOLN
1.0000 [drp] | Freq: Four times a day (QID) | OPHTHALMIC | Status: DC | PRN
  Filled 2020-07-12: qty 15

## 2020-07-12 MED ORDER — BIOTENE DRY MOUTH MT LIQD: 15.0000 mL | OROMUCOSAL | Status: DC | PRN

## 2020-07-12 MED ORDER — SODIUM CHLORIDE 0.9 % IV SOLN
2.0000 g | Freq: Two times a day (BID) | INTRAVENOUS | Status: DC
  Administered 2020-07-12 – 2020-07-13 (×2): 2 g via INTRAVENOUS
  Filled 2020-07-12 (×2): qty 20

## 2020-07-12 MED ORDER — ACETAMINOPHEN 325 MG PO TABS: 650.0000 mg | ORAL_TABLET | Freq: Four times a day (QID) | ORAL | Status: DC | PRN

## 2020-07-12 MED ORDER — GLYCOPYRROLATE 1 MG PO TABS
1.0000 mg | ORAL_TABLET | ORAL | Status: DC | PRN
  Filled 2020-07-12: qty 1

## 2020-07-12 MED ORDER — HALOPERIDOL LACTATE 5 MG/ML IJ SOLN: 1.0000 mg | Freq: Four times a day (QID) | INTRAMUSCULAR | Status: DC | PRN

## 2020-07-12 MED ORDER — SODIUM CHLORIDE 0.9 % IV SOLN
2.0000 g | INTRAVENOUS | Status: DC
  Administered 2020-07-12 – 2020-07-13 (×6): 2 g via INTRAVENOUS
  Filled 2020-07-12: qty 2
  Filled 2020-07-12 (×7): qty 2000
  Filled 2020-07-12: qty 2
  Filled 2020-07-12: qty 2000
  Filled 2020-07-12 (×2): qty 2
  Filled 2020-07-12: qty 2000

## 2020-07-12 MED ORDER — HALOPERIDOL LACTATE 2 MG/ML PO CONC
0.5000 mg | ORAL | Status: DC | PRN
  Filled 2020-07-12: qty 0.3

## 2020-07-12 MED ORDER — ACETAMINOPHEN 650 MG RE SUPP: 650.0000 mg | Freq: Four times a day (QID) | RECTAL | Status: DC | PRN

## 2020-07-12 MED ORDER — ONDANSETRON 4 MG PO TBDP: 4.0000 mg | ORAL_TABLET | Freq: Four times a day (QID) | ORAL | Status: DC | PRN

## 2020-07-12 MED ORDER — MORPHINE SULFATE (PF) 2 MG/ML IV SOLN: 2.0000 mg | INTRAVENOUS | Status: DC | PRN

## 2020-07-12 MED ORDER — HALOPERIDOL LACTATE 5 MG/ML IJ SOLN: 0.5000 mg | INTRAMUSCULAR | Status: DC | PRN

## 2020-07-12 NOTE — Progress Notes (Addendum)
Pt with tachypneic and labored breathing on 2L O2 throughout shift. He is non-verbal and opens his eyes only to voice and pain. He does not follow any commands. Ativan given once due to tremors and increased agitation/non-purposeful limb upper movement noted.  Family GOC meeting with pt's children - daughter and two sons - initiated today. Meeting occurred at bedside with all of pt's children and PA Norvel Richards present. Family in agreement to transition pt comfort care and transfer him to the 6th floor. After meeting, pt's family left unit and returned to unit desiring to rescind the comfort care decision. They are in agreement only to remove NGT and TF. PA Dellinger and MD Thedore Mins made aware of pt's family decision and ABX were reordered.   Pt's daughter inquiring about pt's wedding ring location as family noticed it was missing on 11/13. Per 11/3 note by RN Beatriz Chancellor on 64M, "ring placed in specimen cup next to mirror." 64M, 84M, security and ED contacted regarding wedding ring whereabouts. 5W also searched unit unsuccessfully for the wedding ring. Pt experience called on behalf of pt's daughter and left message for pt experience liason to contact daughter regarding her father's wedding ring and her displeasment with the MDs communication.

## 2020-07-12 NOTE — Progress Notes (Addendum)
PROGRESS NOTE                                                                                                                                                                                                             Patient Demographics:    Gregory Wheeler, is a 79 y.o. male, DOB - Jul 12, 1941, MBW:[id number]  Outpatient Primary MD for the patient is [name], NP    LOS - 20  Admit date - 06/22/2020    Chief Complaint  Patient presents with  . Covid pos  and Not eating or drinking       Brief Narrative (HPI from H&P)   79 year old Caucasian male who was admitted for COVID-19 pneumonia despite being fully vaccinated, his COVID-83 pneumonia was actually quite limited however he went into full-blown DTs and stay was complicated by upper GI bleed for which he was seen by [facility], respiratory arrest resuscitation, critical care was consulted and he was intubated for close to a week, post extubation he is showing signs of ongoing aspiration of oral secretions, extremely frail and weak.  Now wants to be DNR.  We will continue medical treatment and monitor closely for another 24 hours in ICU.  Will involve palliative care as well.   Subjective:   Patient with NG tube lying in bed appears tired, unable to answer questions or follow commands reliably today.   Assessment  & Plan :     1. Acute Hypoxic Resp. Failure due to Acute Covid 19 Viral Pneumonitis along with aspiration pneumonia causing respiratory arrest ongoing aspiration of oral secretions - he finished his COVID-19 specific treatment with steroids and Remdesivir, he was briefly intubated due to respiratory arrest and finally extubated on 07/06/2020.  At this time post extubation on 07/06/2020 he seems to have difficulty controlling his oral secretions and was aspirating his oral secretions, he is tube feed dependent through NG tube, extremely weak and deconditioned with  extremely poor prognosis and high chances of ongoing aspiration.  Patient's wishes he is DNR, palliative care on board.  Oral secretions mildly improved after addition of scopolamine patch which has now been changed to Robinul.  He is on antibiotics for enterococcal bacteremia as in #2 below which is protecting him from full-blown sepsis.  I discussed with the son again plan of care on 07/12/2020, for  now wants to continue tube feeds through NG tube, supportive care, will try and remove feeding tube in couple of days and challenge him with soft diet.  I have again reiterated that patient's prognosis is extremely poor, DNR, he will benefit from full comfort measures if further decline happens.  Appreciate palliative care following.  His original COVID-19 positive date was 06/13/2020, copy of the report in the chart, it is over 21 days and his contact precautions will be removed on 07/11/2020.  SpO2: 95 % O2 Flow Rate (L/min): 2 L/min FiO2 (%): 40 %   2.  Enterococcal bacteremia.  He is currently on 6 weeks total of IV Rocephin and ampicillin through PICC line with a tentative stop date of 08/04/2020.  Poor candidate for TEE.  He has been seen by ID this admission.  For stopping his antibiotic he has to see ID.  3.  Upper GI bleed this admission.  Seen by [name] earlier, underwent EGD on 06/30/2020 showing possible gastritis, on IV PPI, monitor H&H closely.  4.  History of CAD, right bundle branch block, paroxysmal A. fib.  Baseline on Xarelto, due to recent GI bleed currently on full dose Lovenox.  Continue beta-blocker.  5.  Urinary retention.  On Foley, resume Flomax once he is able to take pills orally.  6.  Essential hypertension currently on Norvasc, Nitropaste along with beta-blocker.  7.  Excessive oral secretions.  Added scopolamine patch, secretions have improved.  8 . Alcohol abuse with DTs earlier this admission.  Seems to be stable right now.  Counseled to quit alcohol.  9.   Dysphagia due to extreme weakness and deconditioning.  Tube feeding continued.  Speech following.  10. Third spacing of fluid and edema.  Gentle Lasix PRN with KCL.    Condition - Extremely Guarded  Family Communication  : Son [name] [phone] on 07/07/2020, clearly explained to him that his father wants to be DNR that prognosis is extremely guarded, 07/09/20, 07/10/20, 07/11/20. 07/12/20  Code Status :  DNR  Consults  :  Pall Care, PCCM, GI  Procedures  :    PICC 11/1 >   ETT 11/3 >11/10  TTE -   1. Left ventricular ejection fraction, by estimation, is 60 to 65%. The left ventricle has normal function. The left ventricle has no regional wall motion abnormalities. There is mild left ventricular hypertrophy. Left ventricular diastolic parameters are consistent with Grade I diastolic dysfunction (impaired relaxation).  2. Right ventricular systolic function is moderately reduced. The right ventricular size is normal. There is normal pulmonary artery systolic pressure. The estimated right ventricular systolic pressure is 97.0 mmHg. Abnormal RV, would consider PE.  3. Right atrial size was mildly dilated.  4. The mitral valve is normal in structure. No evidence of mitral valve regurgitation. No evidence of mitral stenosis.  5. The aortic valve is tricuspid. Aortic valve regurgitation is trivial. Mild aortic valve sclerosis is present, with no evidence of aortic valve stenosis.  6. The inferior vena cava is normal in size with greater than 50% respiratory variability, suggesting right atrial pressure of 3 mmHg.  CT -  1. No acute intracranial abnormality. 2. Age related atrophy and chronic small vessel ischemia. 3. Opacification of the lower right greater than left mastoid air cells likely mastoid effusion, progressed from 2017. Small fluid levels in the left and right side of the sphenoid sinus.  PUD Prophylaxis : PPI  Disposition Plan  :    Status is: Inpatient  Remains inpatient  appropriate because:IV treatments appropriate due to intensity of illness or inability to take PO   Dispo: The patient is from: Home              Anticipated d/c is to: SNF              Anticipated d/c date is: > 3 days              Patient currently is not medically stable to d/c.  DVT Prophylaxis  :  Lovenox   Lab Results  Component Value Date   PLT 323 07/11/2020    Diet :  Diet Order            Diet NPO time specified Except for: Sips with Meds  Diet effective now                  Inpatient Medications  Scheduled Meds: . allopurinol  300 mg Per Tube QHS  . amLODipine  10 mg Per Tube Daily  . chlorhexidine gluconate (MEDLINE KIT)  15 mL Mouth Rinse BID  . Chlorhexidine Gluconate Cloth  6 each Topical Daily  . enoxaparin (LOVENOX) injection  100 mg Subcutaneous Q12H  . folic acid  1 mg Per Tube Daily  . glycopyrrolate  0.1 mg Intravenous TID  . insulin aspart  0-6 Units Subcutaneous Q4H  . mouth rinse  15 mL Mouth Rinse 10 times per day  . metoprolol tartrate  12.5 mg Per Tube BID  . multivitamin with minerals  1 tablet Per Tube Daily  . nitroGLYCERIN  1 inch Topical Q6H  . pantoprazole sodium  40 mg Per Tube BID  . sodium chloride flush  10-40 mL Intracatheter Q12H  . thiamine  100 mg Per Tube Daily   Continuous Infusions: . ampicillin (OMNIPEN) IV 2 g (07/12/20 0602)  . cefTRIAXone (ROCEPHIN)  IV 2 g (07/12/20 0843)  . feeding supplement (VITAL 1.5 CAL) 1,000 mL (07/10/20 0245)   PRN Meds:.acetaminophen, guaiFENesin-dextromethorphan, hydrALAZINE, LORazepam, [DISCONTINUED] ondansetron **OR** ondansetron (ZOFRAN) IV, polyethylene glycol, white petrolatum  Antibiotics  :    Anti-infectives (From admission, onward)   Start     Dose/Rate Route Frequency Ordered Stop   07/01/20 1200  ampicillin (OMNIPEN) 2 g in sodium chloride 0.9 % 100 mL IVPB        2 g 300 mL/hr over 20 Minutes Intravenous Every 4 hours 07/01/20 0955     06/29/20 1201  Ampicillin-Sulbactam  (UNASYN) 3 g in sodium chloride 0.9 % 100 mL IVPB  Status:  Discontinued        3 g 200 mL/hr over 30 Minutes Intravenous Every 6 hours 06/29/20 0959 07/01/20 0955   06/29/20 1045  cefTRIAXone (ROCEPHIN) 2 g in sodium chloride 0.9 % 100 mL IVPB        2 g 200 mL/hr over 30 Minutes Intravenous Every 12 hours 06/29/20 0959     06/29/20 1030  cefTRIAXone (ROCEPHIN) 2 g in sodium chloride 0.9 % 100 mL IVPB  Status:  Discontinued        2 g 200 mL/hr over 30 Minutes Intravenous Every 24 hours 06/29/20 0944 06/29/20 0959   06/29/20 0500  vancomycin (VANCOREADY) IVPB 750 mg/150 mL  Status:  Discontinued        750 mg 150 mL/hr over 60 Minutes Intravenous Every 12 hours 06/28/20 1441 06/29/20 0442   06/28/20 2030  fluconazole (DIFLUCAN) IVPB 100 mg  Status:  Discontinued  100 mg 50 mL/hr over 60 Minutes Intravenous Every 24 hours 06/28/20 1906 07/01/20 1538   06/28/20 2030  Ampicillin-Sulbactam (UNASYN) 3 g in sodium chloride 0.9 % 100 mL IVPB  Status:  Discontinued        3 g 200 mL/hr over 30 Minutes Intravenous Every 8 hours 06/28/20 1940 06/29/20 0959   06/28/20 1600  acyclovir (ZOVIRAX) 110 mg in dextrose 5 % 100 mL IVPB  Status:  Discontinued        110 mg 102.2 mL/hr over 60 Minutes Intravenous Every 8 hours 06/28/20 1441 06/28/20 1448   06/28/20 1600  acyclovir (ZOVIRAX) 900 mg in dextrose 5 % 150 mL IVPB  Status:  Discontinued        900 mg 168 mL/hr over 60 Minutes Intravenous Every 8 hours 06/28/20 1450 06/28/20 1912   06/28/20 1530  vancomycin (VANCOREADY) IVPB 1500 mg/300 mL        1,500 mg 150 mL/hr over 120 Minutes Intravenous  Once 06/28/20 1441 06/28/20 2037   06/27/20 1400  ampicillin (OMNIPEN) 2 g in sodium chloride 0.9 % 100 mL IVPB  Status:  Discontinued        2 g 300 mL/hr over 20 Minutes Intravenous Every 4 hours 06/27/20 1216 06/28/20 1437   06/25/20 1947  Ampicillin-Sulbactam (UNASYN) 3 g in sodium chloride 0.9 % 100 mL IVPB  Status:  Discontinued        3  g 200 mL/hr over 30 Minutes Intravenous Every 6 hours 06/25/20 1419 06/27/20 1218   06/24/20 2200  cefTRIAXone (ROCEPHIN) 2 g in sodium chloride 0.9 % 100 mL IVPB  Status:  Discontinued        2 g 200 mL/hr over 30 Minutes Intravenous Every 12 hours 06/24/20 1513 06/28/20 1437   06/24/20 1230  cefTRIAXone (ROCEPHIN) 2 g in sodium chloride 0.9 % 100 mL IVPB  Status:  Discontinued        2 g 200 mL/hr over 30 Minutes Intravenous Every 24 hours 06/24/20 1139 06/24/20 1513   06/23/20 1430  azithromycin (ZITHROMAX) 500 mg in sodium chloride 0.9 % 250 mL IVPB  Status:  Discontinued        500 mg 250 mL/hr over 60 Minutes Intravenous Every 24 hours 06/22/20 1406 06/22/20 1410   06/23/20 1300  Ampicillin-Sulbactam (UNASYN) 3 g in sodium chloride 0.9 % 100 mL IVPB  Status:  Discontinued        3 g 200 mL/hr over 30 Minutes Intravenous Every 8 hours 06/23/20 1131 06/25/20 1419   06/23/20 1000  remdesivir 100 mg in sodium chloride 0.9 % 100 mL IVPB        100 mg 200 mL/hr over 30 Minutes Intravenous Daily 06/22/20 1345 06/26/20 1200   06/23/20 0945  ampicillin (OMNIPEN) 2 g in sodium chloride 0.9 % 100 mL IVPB  Status:  Discontinued        2 g 300 mL/hr over 20 Minutes Intravenous Every 4 hours 06/23/20 0852 06/23/20 1131   06/22/20 1430  azithromycin (ZITHROMAX) 500 mg in sodium chloride 0.9 % 250 mL IVPB  Status:  Discontinued        500 mg 250 mL/hr over 60 Minutes Intravenous Every 24 hours 06/22/20 1410 06/23/20 0902   06/22/20 1400  remdesivir 100 mg in sodium chloride 0.9 % 100 mL IVPB        100 mg 200 mL/hr over 30 Minutes Intravenous Every 30 min 06/22/20 1345 06/22/20 1518   06/22/20 1400  cefTRIAXone (ROCEPHIN)  2 g in sodium chloride 0.9 % 100 mL IVPB  Status:  Discontinued        2 g 200 mL/hr over 30 Minutes Intravenous Every 24 hours 06/22/20 1357 06/23/20 0902   06/22/20 1400  azithromycin (ZITHROMAX) 500 mg in sodium chloride 0.9 % 250 mL IVPB  Status:  Discontinued        500  mg 250 mL/hr over 60 Minutes Intravenous Every 24 hours 06/22/20 1357 06/22/20 1406       Time Spent in minutes  30   Lala Lund M.D on 07/12/2020 at 10:26 AM  To page go to [email or web address] - password [facility]  [facility] -  Office  [phone]   See all Orders from today for further details    Objective:   Vitals:   07/12/20 0400 07/12/20 0500 07/12/20 0715 07/12/20 0900  BP: (!) 141/99 (!) 150/119 119/88   Pulse: (!) 105 (!) 109 100   Resp: (!) 23 17 (!) 21   Temp: 99 F (37.2 C)  98.5 F (36.9 C) 98 F (36.7 C)  TempSrc: Axillary  Oral Oral  SpO2: 96% 95% 95%   Weight:  103.4 kg    Height:        Wt Readings from Last 3 Encounters:  07/12/20 103.4 kg  03/14/20 110.3 kg  09/17/19 106.6 kg     Intake/Output Summary (Last 24 hours) at 07/12/2020 1026 Last data filed at 07/11/2020 1851 Gross per 24 hour  Intake 0 ml  Output 750 ml  Net -750 ml     Physical Exam  Awake but appears extremely weak, NG tube in place, with Scopolamine patch >> robinul oral secretions have reduced and so is gurgling, still drooling from the mouth and likely unable to control his oral secretions [place].AT,PERRAL Supple Neck,No JVD, No cervical lymphadenopathy appriciated.  Symmetrical Chest wall movement, Good air movement bilaterally, CTAB RRR,No Gallops, Rubs or new Murmurs, No Parasternal Heave +ve B.Sounds, Abd Soft, No tenderness, No organomegaly appriciated, No rebound - guarding or rigidity. No Cyanosis, trace edema, No new Rash or bruise      Data Review:    CBC Recent Labs  Lab 07/07/20 0515 07/08/20 0407 07/09/20 0531 07/10/20 0356 07/11/20 0429  WBC 10.6* 9.1 8.0 8.6 8.9  HGB 8.5* 7.9* 7.7* 8.2* 8.3*  HCT 26.8* 25.6* 24.8* 26.3* 27.2*  PLT 177 193 224 276 323  MCV 106.3* 107.6* 108.8* 107.3* 108.4*  MCH 33.7 33.2 33.8 33.5 33.1  MCHC 31.7 30.9 31.0 31.2 30.5  RDW 15.4 15.5 15.3 15.7* 15.9*  LYMPHSABS  --  1.1 0.8 1.1 1.2  MONOABS  --  0.6 0.5  0.5 0.5  EOSABS  --  0.4 0.5 0.6* 0.7*  BASOSABS  --  0.0 0.0 0.0 0.0    Recent Labs  Lab 07/06/20 0500 07/06/20 0500 07/07/20 0515 07/07/20 0711 07/08/20 0407 07/08/20 0408 07/09/20 0531 07/10/20 0356 07/11/20 0429 07/11/20 0430 07/12/20 0813  NA 142   < > 142  --  142  --  141 142 140  --   --   K 4.2   < > 4.4  --  4.4  --  3.9 3.7 3.9  --   --   CL 101   < > 104  --  104  --  106 106 105  --   --   CO2 30   < > 28  --  27  --  _0 --   --  GLUCOSE 100*   < > 100*  --  110*  --  109* 109* 101*  --   --   BUN 12   < > 13  --  14  --  _0 --   --   CREATININE 0.87   < > 0.92  --  0.94  --  0.88 0.89 0.84  --   --   CALCIUM 9.6   < > 9.6  --  9.7  --  9.5 9.5 9.5  --   --   AST  --   --   --   --  46*  --  35 31 28  --   --   ALT  --   --   --   --  32  --  _1 --   --   ALKPHOS  --   --   --   --  64  --  61 64 66  --   --   BILITOT  --   --   --   --  0.5  --  0.4 0.6 0.4  --   --   ALBUMIN  --   --   --   --  2.0*  --  2.1* 2.2* 2.3*  --   --   MG 1.6*  --  1.8  --   --   --   --   --   --   --   --   CRP  --   --   --   --  2.5*  --  1.2* 0.9 1.0*  --   --   DDIMER  --   --   --   --  1.86*  --  1.88* 1.94* 2.03*  --   --   PROCALCITON  --   --  0.30  --  0.27  --   --   --   --   --  0.20  BNP  --   --   --  464.9*  --  255.3* 344.4* 197.7*  --  139.7*  --    < > = values in this interval not displayed.    ------------------------------------------------------------------------------------------------------------------ No results for input(s): CHOL, HDL, LDLCALC, TRIG, CHOLHDL, LDLDIRECT in the last 72 hours.  Lab Results  Component Value Date   HGBA1C 5.9 (H) 06/29/2020   ------------------------------------------------------------------------------------------------------------------ No results for input(s): TSH, T4TOTAL, T3FREE, THYROIDAB in the last 72 hours.  Invalid input(s): FREET3  Cardiac Enzymes No results for input(s): CKMB,  TROPONINI, MYOGLOBIN in the last 168 hours.  Invalid input(s): CK ------------------------------------------------------------------------------------------------------------------    Component Value Date/Time   BNP 139.7 (H) 07/11/2020 0430    Micro Results Recent Results (from the past 240 hour(s))  Culture, Urine     Status: None   Collection Time: 07/07/20  8:55 AM   Specimen: Urine, Random  Result Value Ref Range Status   Specimen Description URINE, RANDOM  Final   Special Requests NONE  Final   Culture   Final    NO GROWTH Performed at [facility], [address] [place], [place] [address]    Report Status 07/09/2020 FINAL  Final    Radiology Reports DG Abd 1 View  Result Date: 06/29/2020 CLINICAL DATA:  OG tube placement. EXAM: ABDOMEN - 1 VIEW COMPARISON:  Abdominal radiographs June 28, 2020, chest radiograph June 27, 2020 FINDINGS: Orogastric tube courses below the diaphragm with the tip  projecting in the expected region of the stomach. Side port is below the gastroesophageal junction. Partially imaged airspace opacities in the lung bases, better characterized on recent chest radiograph. Partially imaged scattered retained contrast within colonic diverticula in the upper abdomen. IMPRESSION: 1. Orogastric tube courses below the diaphragm with the tip projecting in the expected region of the stomach. 2. Bibasilar airspace opacities. Electronically Signed   By: Margaretha Sheffield MD   On: 06/29/2020 11:21   CT HEAD WO CONTRAST  Result Date: 06/30/2020 CLINICAL DATA:  Unresponsive since respiratory arrest yesterday EXAM: CT HEAD WITHOUT CONTRAST TECHNIQUE: Contiguous axial images were obtained from the base of the skull through the vertex without intravenous contrast. COMPARISON:  CT 06/22/2020 FINDINGS: Brain: No evidence of acute infarction, hemorrhage, hydrocephalus, extra-axial collection, visible mass lesion or mass effect. Symmetric prominence of the  ventricles, cisterns and sulci compatible with parenchymal volume loss. Mixed patchy and confluent areas of white matter hypoattenuation are most compatible with chronic microvascular angiopathy. Vascular: Atherosclerotic calcification of the carotid siphons and intradural vertebral arteries. No hyperdense vessel. Skull: No scalp swelling or hematoma. No calvarial fracture or suspicious osseous lesion. Sinuses/Orbits: Minimal thickening in the ethmoids and maxillary sinuses with pneumatized secretions and layering air-fluid levels in the sphenoid sinuses possibly related to instrumentation. Bilateral mastoid effusions are present as well. Middle ear cavities are clear. Orbital structures are unremarkable aside from prior lens extractions. Other: None. IMPRESSION: 1. No CT evidence of acute large territory or cortically based infarct nor other acute intracranial abnormality. If there is persisting concern for acute infarction, MRI is more sensitive and specific for early features of ischemia. 2. Moderate parenchymal volume loss and chronic microvascular angiopathy. 3. Mural thickening in the paranasal sinuses with layering fluid in the sphenoid sinuses and bilateral mastoid effusions. Possibly related to instrumentation/intubation. Electronically Signed   By: Lovena Le M.D.   On: 06/30/2020 01:44   CT HEAD WO CONTRAST  Result Date: 06/22/2020 CLINICAL DATA:  Delirium. EXAM: CT HEAD WITHOUT CONTRAST TECHNIQUE: Contiguous axial images were obtained from the base of the skull through the vertex without intravenous contrast. COMPARISON:  Head CT 05/14/2016 FINDINGS: Brain: No intracranial hemorrhage, mass effect, or midline shift. Age related atrophy. No hydrocephalus. The basilar cisterns are patent. Mild to moderate chronic small vessel ischemia with slight progression from 2017. No evidence of territorial infarct or acute ischemia. No extra-axial or intracranial fluid collection. Vascular: Atherosclerosis of  skullbase vasculature without hyperdense vessel or abnormal calcification. Skull: No fracture or focal lesion. Sinuses/Orbits: Opacification of the lower right greater than left mastoid air cells, progressed from 2017. Small fluid levels in the left and right side of sphenoid sinus. No acute orbital abnormality. Bilateral cataract resection. Other: None. IMPRESSION: 1. No acute intracranial abnormality. 2. Age related atrophy and chronic small vessel ischemia. 3. Opacification of the lower right greater than left mastoid air cells likely mastoid effusion, progressed from 2017. Small fluid levels in the left and right side of the sphenoid sinus. Electronically Signed   By: Keith Rake M.D.   On: 06/22/2020 22:36   DG Chest Port 1 View  Result Date: 07/12/2020 CLINICAL DATA:  Shortness of breath. EXAM: PORTABLE CHEST 1 VIEW COMPARISON:  07/07/2020 FINDINGS: Stable cardiomegaly. Moderate hiatal hernia again noted. Feeding tube is seen entering the stomach. Left arm PICC line remains in appropriate position. Mild airspace disease is again seen in the left lower lung, and through the peripheral aspect of the right long, but shows no significant  change compared to prior study. No evidence of pneumothorax or pleural effusion. IMPRESSION: No significant change in mild bilateral airspace disease. Stable hiatal hernia. Stable cardiomegaly. Electronically Signed   By: Marlaine Hind M.D.   On: 07/12/2020 08:17   DG Chest Port 1 View  Result Date: 07/07/2020 CLINICAL DATA:  Shortness of breath.  COVID-19 positive EXAM: PORTABLE CHEST 1 VIEW COMPARISON:  July 05, 2020. FINDINGS: Feeding tube tip is below the diaphragm. Endotracheal tube no longer present. Central catheter tip is in the superior vena cava. No pneumothorax. Areas of ill-defined opacity present in the mid and lower lung regions with somewhat less opacity bilaterally compared to 2 days prior. Stable apical pleural thickening. Stable cardiac  prominence with pulmonary vascularity normal. Sizable hiatal type hernia present. IMPRESSION: Patchy airspace opacity bilaterally, slightly less compared to 2 days prior. Stable cardiomegaly. Sizable hiatal type hernia. Feeding tube tip below diaphragm. Electronically Signed   By: Lowella Grip III M.D.   On: 07/07/2020 08:04   DG CHEST PORT 1 VIEW  Result Date: 07/05/2020 CLINICAL DATA:  Pneumonia EXAM: PORTABLE CHEST 1 VIEW COMPARISON:  Six days ago FINDINGS: Endotracheal tube with tip halfway between the clavicular heads and carina. Feeding tube at least reaches the stomach. Left PICC with tip at the SVC. Patchy bilateral pneumonia with progression the left base. No visible effusion or pneumothorax. Cardiomegaly. IMPRESSION: 1. Unremarkable hardware. 2. Multifocal pneumonia with progression at the left base. Electronically Signed   By: Monte Fantasia M.D.   On: 07/05/2020 06:14   DG CHEST PORT 1 VIEW  Result Date: 06/29/2020 CLINICAL DATA:  Intubation EXAM: PORTABLE CHEST 1 VIEW COMPARISON:  Two days ago FINDINGS: Endotracheal tube with tip between the clavicular heads and carina. Enteric tube with side port seen over the stomach. Left PICC with tip near the SVC brachiocephalic junction. Patchy bilateral infiltrate. Cardiopericardial enlargement accentuated by rightward rotation. No visible effusion or pneumothorax. IMPRESSION: 1. Intubation with unremarkable hardware. 2. Atypical pneumonia with mild progression from 2 days ago. Electronically Signed   By: Monte Fantasia M.D.   On: 06/29/2020 05:07   DG CHEST PORT 1 VIEW  Result Date: 06/27/2020 CLINICAL DATA:  PICC in place EXAM: PORTABLE CHEST 1 VIEW COMPARISON:  06/27/2020 FINDINGS: Left arm PICC terminates in the mid SVC. Subpleural patchy opacities in the lungs bilaterally, left lower lobe predominant, compatible with pneumonia in this patient with known COVID. No pleural effusion or pneumothorax. The heart is top-normal in size.  IMPRESSION: Left arm PICC terminates in the mid SVC. Multifocal pneumonia in this patient with known COVID. Electronically Signed   By: Julian Hy M.D.   On: 06/27/2020 09:35   DG Chest Port 1 View  Result Date: 06/27/2020 CLINICAL DATA:  Shortness of breath, COVID-19 positive. EXAM: PORTABLE CHEST 1 VIEW COMPARISON:  June 23, 2020. FINDINGS: Stable cardiomegaly. Stable multiple airspace opacities are noted bilaterally consistent with multifocal pneumonia. No pneumothorax or pleural effusion is noted. Bony thorax is unremarkable. IMPRESSION: Stable bilateral multifocal pneumonia. Electronically Signed   By: Marijo Conception M.D.   On: 06/27/2020 08:07   DG Chest Port 1 View  Result Date: 06/23/2020 CLINICAL DATA:  Shortness of breath and COVID. EXAM: PORTABLE CHEST 1 VIEW COMPARISON:  06/22/2020 FINDINGS: 0846 hours. Low lung volumes. Progressive patchy ground-glass and consolidative airspace opacity in the lower lungs. The cardio pericardial silhouette is enlarged. No substantial pleural effusion. The visualized bony structures of the thorax show no acute abnormality. Telemetry leads overlie  the chest. IMPRESSION: Progressive bibasilar airspace disease consistent with multifocal pneumonia. Electronically Signed   By: Misty Stanley M.D.   On: 06/23/2020 09:04   DG Chest Port 1 View  Result Date: 06/22/2020 CLINICAL DATA:  COVID, altered mental status. EXAM: PORTABLE CHEST 1 VIEW COMPARISON:  10/19/2014. FINDINGS: Low lung volumes. Subtle airspace opacities in the peripheral lung bases. No visible pleural effusions or pneumothorax. Cardiomediastinal silhouette is within normal limits. No evidence of acute osseous abnormality. IMPRESSION: Subtle airspace opacities in the peripheral lung bases, concerning for multifocal pneumonia and consistent with reported COVID diagnosis. Electronically Signed   By: Margaretha Sheffield MD   On: 06/22/2020 11:39   DG Abd Portable 1V  Result Date:  06/28/2020 CLINICAL DATA:  Nausea, vomiting blood, COVID-19, atrial fibrillation, coronary artery disease, hypertension, former smoker EXAM: PORTABLE ABDOMEN - 1 VIEW COMPARISON:  Portable exam 1013 hours compared to CT abdomen and pelvis of 06/20/2018 FINDINGS: Scattered retained contrast in colonic diverticula and rectum. Nonobstructive bowel gas pattern. No bowel dilatation or bowel wall thickening. Bones demineralized with degenerative changes lumbar spine. IMPRESSION: Colonic diverticulosis. Electronically Signed   By: Lavonia Dana M.D.   On: 06/28/2020 10:25   DG Swallowing Func-Speech Pathology  Result Date: 06/23/2020 Objective Swallowing Evaluation: Type of Study: MBS-Modified Barium Swallow Study  Patient Details Name: LON KLIPPEL MRN: [id number] Date of Birth: [birth year] Today's Date: 06/23/2020 Time: SLP Start Time (ACUTE ONLY): 1417 -SLP Stop Time (ACUTE ONLY): 4403 SLP Time Calculation (min) (ACUTE ONLY): 20 min Past Medical History: Past Medical History: Diagnosis Date . Arthritis  . Atrial fibrillation ([place]) 01/31/2015 . Chronic gout  . Coronary artery disease 12/2007  STATUS POST STENTING OF THE LEFT ANTERIOR DESCENDING ARTERY . COVID-19  . Dyslipidemia  . Gout  . Hiatal hernia  . Hyperlipidemia  . Hypertension  . LBP (low back pain)  . Partial small bowel obstruction ([place])  . RBBB (right bundle branch block)  . SBO (small bowel obstruction) ([facility])   PARTIAL . Second degree AV block, Mobitz type I  . Urinary tract infection   Escherichia coli urinary tract infection Past Surgical History: Past Surgical History: Procedure Laterality Date . CARDIAC CATHETERIZATION   . HERNIA REPAIR    INGUINAL . ROTATOR CUFF REPAIR   HPI: 79 yo male presenting to ED with generalized weakness, poor food intake, and confusion. COVID positive. PMH including hiatal hernia, arthritis, A-fib, HTN, AKI, alcohol abuse, and CAD with s/p stenting of L anterior descending artery. CXR Progressive bibasilar airspace disease  consistent with multifocal pneumonia.  No data recorded Assessment / Plan / Recommendation CHL IP CLINICAL IMPRESSIONS 06/23/2020 Clinical Impression Pt exhibits mild oropharyngeal dysphagia with aspiration of nectar consistency, and mild vallecular residual. 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 Pt unable to contain entire bolus in oral cavity and moderate amount nectar thick fell over tongue and was aspirated prior to initiation of laryngeal closure (frank volume) with immediate cough. Timing of closure delayed with thin using chin tuck and slight aspiraiton present. Lingual residue present falling into valleculae post swallow. Chin down posture using straw was effective with nectar however pt displays memory deficit and follow through of strategy is questionable. 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 Recommend honey thick liquids, Dys 3, crush meds. full supervision/assist. SLP will work with pt on chin tuck with nectar for possible upgrade.  SLP Visit Diagnosis Dysphagia, oropharyngeal  phase (R13.12) Attention and concentration deficit following -- Frontal lobe and executive function deficit following -- Impact on safety and function Mild aspiration risk;Moderate aspiration risk   CHL IP TREATMENT RECOMMENDATION 06/23/2020 Treatment Recommendations Therapy as outlined in treatment plan below   Prognosis 06/23/2020 Prognosis for Safe Diet Advancement (No Data) Barriers to Reach Goals Cognitive deficits Barriers/Prognosis Comment -- CHL IP DIET RECOMMENDATION 06/23/2020 SLP Diet Recommendations Dysphagia 3 (Mech soft) solids;Honey thick liquids Liquid Administration via Cup Medication Administration Crushed with puree Compensations Minimize environmental distractions;Slow rate;Small sips/bites Postural Changes Seated upright at 90 degrees;Remain semi-upright after after feeds/meals  (Comment)   CHL IP OTHER RECOMMENDATIONS 06/23/2020 Recommended Consults -- Oral Care Recommendations Oral care BID Other Recommendations --   CHL IP FOLLOW UP RECOMMENDATIONS 06/23/2020 Follow up Recommendations (No Data)   CHL IP FREQUENCY AND DURATION 06/23/2020 Speech Therapy Frequency (ACUTE ONLY) min 2x/week Treatment Duration 2 weeks      CHL IP ORAL PHASE 06/23/2020 Oral Phase Impaired Oral - Pudding Teaspoon -- Oral - Pudding Cup -- Oral - Honey Teaspoon -- Oral - Honey Cup Lingual/palatal residue;Piecemeal swallowing Oral - Nectar Teaspoon -- Oral - Nectar Cup Decreased bolus cohesion Oral - Nectar Straw Decreased bolus cohesion Oral - Thin Teaspoon -- Oral - Thin Cup -- Oral - Thin Straw -- Oral - Puree Piecemeal swallowing Oral - Mech Soft -- Oral - Regular -- Oral - Multi-Consistency -- Oral - Pill -- Oral Phase - Comment --  CHL IP PHARYNGEAL PHASE 06/23/2020 Pharyngeal Phase Impaired Pharyngeal- Pudding Teaspoon -- Pharyngeal -- Pharyngeal- Pudding Cup -- Pharyngeal -- Pharyngeal- Honey Teaspoon -- Pharyngeal -- Pharyngeal- Honey Cup Pharyngeal residue - valleculae Pharyngeal -- Pharyngeal- Nectar Teaspoon -- Pharyngeal -- Pharyngeal- Nectar Cup Penetration/Aspiration before swallow Pharyngeal Material enters airway, passes BELOW cords and not ejected out despite cough attempt by patient Pharyngeal- Nectar Straw Penetration/Aspiration during swallow Pharyngeal Material enters airway, remains ABOVE vocal cords then ejected out;Material does not enter airway Pharyngeal- Thin Teaspoon NT Pharyngeal -- Pharyngeal- Thin Cup NT Pharyngeal -- Pharyngeal- Thin Straw Penetration/Aspiration during swallow Pharyngeal Material enters airway, passes BELOW cords without attempt by patient to eject out (silent aspiration) Pharyngeal- Puree Pharyngeal residue - valleculae Pharyngeal -- Pharyngeal- Mechanical Soft -- Pharyngeal -- Pharyngeal- Regular WFL Pharyngeal -- Pharyngeal- Multi-consistency -- Pharyngeal --  Pharyngeal- Pill -- Pharyngeal -- Pharyngeal Comment --  CHL IP CERVICAL ESOPHAGEAL PHASE 06/23/2020 Cervical Esophageal Phase WFL Pudding Teaspoon -- Pudding Cup -- Honey Teaspoon -- Honey Cup -- Nectar Teaspoon -- Nectar Cup -- Nectar Straw -- Thin Teaspoon -- Thin Cup -- Thin Straw -- Puree -- Mechanical Soft -- Regular -- Multi-consistency -- Pill -- Cervical Esophageal Comment -- [place] Siren 06/23/2020, 4:04 PM Orbie Pyo Colvin Caroli.Ed Actor Pager [phone] Office [phone]              ECHOCARDIOGRAM COMPLETE  Result Date: 06/23/2020    ECHOCARDIOGRAM REPORT   Patient Name:   REGINAL WOJCICKI Date of Exam: 06/23/2020 Medical Rec #:  [id number]      Height:       71.0 in Accession #:    [id number]     Weight:       244.7 lb Date of Birth:  1941-03-24      BSA:          2.297 m Patient Age:    36 years       BP:           170/76 mmHg  Patient Gender: M              HR:           71 bpm. Exam Location:  Inpatient Procedure: 2D Echo, Cardiac Doppler and Color Doppler Indications:    R07.9* Chest pain, unspecified. Elevated troponin  History:        Patient has prior history of Echocardiogram examinations, most                 recent 02/24/2019. CAD, Abnormal ECG, Arrythmias:RBBB,                 Signs/Symptoms:Hypotension; Risk Factors:Dyslipidemia. Covid 19                 positive. ETOH.  Sonographer:    Roseanna Rainbow RDCS Referring Phys: [place]  Sonographer Comments: Patient in Fowlaer's position. IMPRESSIONS  1. Left ventricular ejection fraction, by estimation, is 60 to 65%. The left ventricle has normal function. The left ventricle has no regional wall motion abnormalities. There is mild left ventricular hypertrophy. Left ventricular diastolic parameters are consistent with Grade I diastolic dysfunction (impaired relaxation).  2. Right ventricular systolic function is moderately reduced. The right ventricular size is normal. There is normal pulmonary artery  systolic pressure. The estimated right ventricular systolic pressure is 13.1 mmHg. Abnormal RV, would consider PE.  3. Right atrial size was mildly dilated.  4. The mitral valve is normal in structure. No evidence of mitral valve regurgitation. No evidence of mitral stenosis.  5. The aortic valve is tricuspid. Aortic valve regurgitation is trivial. Mild aortic valve sclerosis is present, with no evidence of aortic valve stenosis.  6. The inferior vena cava is normal in size with greater than 50% respiratory variability, suggesting right atrial pressure of 3 mmHg. FINDINGS  Left Ventricle: Left ventricular ejection fraction, by estimation, is 60 to 65%. The left ventricle has normal function. The left ventricle has no regional wall motion abnormalities. The left ventricular internal cavity size was normal in size. There is  mild left ventricular hypertrophy. Left ventricular diastolic parameters are consistent with Grade I diastolic dysfunction (impaired relaxation). Right Ventricle: The right ventricular size is normal. No increase in right ventricular wall thickness. Right ventricular systolic function is moderately reduced. There is normal pulmonary artery systolic pressure. The tricuspid regurgitant velocity is 2.75 m/s, and with an assumed right atrial pressure of 3 mmHg, the estimated right ventricular systolic pressure is 43.8 mmHg. Left Atrium: Left atrial size was normal in size. Right Atrium: Right atrial size was mildly dilated. Pericardium: There is no evidence of pericardial effusion. Mitral Valve: The mitral valve is normal in structure. No evidence of mitral valve regurgitation. No evidence of mitral valve stenosis. Tricuspid Valve: The tricuspid valve is normal in structure. Tricuspid valve regurgitation is trivial. Aortic Valve: The aortic valve is tricuspid. Aortic valve regurgitation is trivial. Mild aortic valve sclerosis is present, with no evidence of aortic valve stenosis. Pulmonic Valve: The  pulmonic valve was normal in structure. Pulmonic valve regurgitation is not visualized. Aorta: The aortic root is normal in size and structure. Venous: The inferior vena cava is normal in size with greater than 50% respiratory variability, suggesting right atrial pressure of 3 mmHg. IAS/Shunts: No atrial level shunt detected by color flow Doppler.  LEFT VENTRICLE PLAX 2D LVIDd:         3.80 cm     Diastology LVIDs:         2.60 cm  LV e' medial:    4.35 cm/s LV PW:         1.70 cm     LV E/e' medial:  16.9 LV IVS:        1.50 cm     LV e' lateral:   5.33 cm/s LVOT diam:     2.30 cm     LV E/e' lateral: 13.8 LV SV:         97 LV SV Index:   42 LVOT Area:     4.15 cm  LV Volumes (MOD) LV vol d, MOD A2C: 78.7 ml LV vol d, MOD A4C: 84.2 ml LV vol s, MOD A2C: 17.5 ml LV vol s, MOD A4C: 25.2 ml LV SV MOD A2C:     61.2 ml LV SV MOD A4C:     84.2 ml LV SV MOD BP:      64.3 ml RIGHT VENTRICLE             IVC RV S prime:     18.50 cm/s  IVC diam: 2.20 cm TAPSE (M-mode): 3.2 cm LEFT ATRIUM             Index       RIGHT ATRIUM           Index LA diam:        3.70 cm 1.61 cm/m  RA Area:     18.30 cm LA Vol (A2C):   34.1 ml 14.84 ml/m RA Volume:   53.10 ml  23.11 ml/m LA Vol (A4C):   49.7 ml 21.63 ml/m LA Biplane Vol: 43.7 ml 19.02 ml/m  AORTIC VALVE LVOT Vmax:   124.00 cm/s LVOT Vmean:  89.800 cm/s LVOT VTI:    0.233 m  AORTA Ao Root diam: 3.50 cm MITRAL VALVE               TRICUSPID VALVE MV Area (PHT): 3.21 cm    TR Peak grad:   30.2 mmHg MV Decel Time: 236 msec    TR Vmax:        275.00 cm/s MV E velocity: 73.30 cm/s MV A velocity: 99.05 cm/s  SHUNTS MV E/A ratio:  0.74        Systemic VTI:  0.23 m                            Systemic Diam: 2.30 cm Loralie Champagne MD Electronically signed by Loralie Champagne MD Signature Date/Time: 06/23/2020/4:53:51 PM    Final    [place] EKG SITE RITE  Result Date: 06/29/2020 If Site Rite image not attached, placement could not be confirmed due to current cardiac rhythm.  [place] EKG SITE  RITE  Result Date: 06/25/2020 If Site Rite image not attached, placement could not be confirmed due to current cardiac rhythm.

## 2020-07-12 NOTE — Progress Notes (Addendum)
Daily Progress Note   Patient Name: Gregory Wheeler       Date: 07/12/2020 DOB: [birth year]  Age: 79 y.o. MRN#: [id number] Attending Physician: Leroy Sea, MD Primary Care Physician: Jacqualine Mau, NP Admit Date: 06/22/2020  Reason for Consultation/Follow-up:   To discuss complex medical decision making related to patient's goals of care Called to bedside by RN who felt patient was nearing EOL.  Not eating.  Having delirium.  Not purposeful.  Subjective: Talked with Lasandra Beech and [name] at bedside.  Patient is delirious and unable to communicate meaningfully after receiving ativan for tremors. Discussed PMH and hospital course including UGIB, aspiration, and respiratory arrest on 11/3.   Family states they were never told he had respiratory arrest.  Never understood why he was on life support.  [name] expresses she felt like they were kept in the dark and the doctors did not communicate with them about the respiratory arrest.  We talked thru the bacteremia, the history of ETOH use, and the mild case of COVID.  CT head does not show acute changes that would cause this change in mental status.  He is not eating.  SLP evaluations indicate this is a result of his mental status.   I expressed our concern that patient is nearing end of life and we talked about comfort measures.    Feeding tubes are not helpful at end of life.  Forcing artificial nutrition and fluids would only serve to volume overload him and cause severe symptoms of respiratory distress.   I stated we could shift to comfort measures now - however we would reassess each day and if he appeared improved we could change course.  Family agreeable to removing cor trak, which would also allow mitts to be removed.  Discussed  focusing on his symptoms and comfort and moving to 6N.   Family agreeable.  I received a call after our meeting that the family had changed their minds and did not want to move to 6N or remove antibiotics etc.  Assessment: Patient has had a prolonged hospitalization.  PMH of heart disease, bowel obstructions, heavy alcohol use.  Admitted with COVID and bacteremia.  Suffered severe DTs, UGIB, aspiration and respiratory arrest.  Was intubated x 1 week.  Has suffered with encephalopathy since.  At times patient is  more alert and coherent but could never swallow safely.  Today with increased respirations and decreased mental status. Still without PO intake.   Patient Profile/HPI:  79 y.o. male  with past medical history of coronary artery disease, gout, heavy alcohol use, atrial fib, admitted on 06/22/2020 with mild Covid pneumonia, aspiration pneumonia, bacteremia with Enterococcus.  Admission has been complicated by delirium tremens and upper GI bleed resulting in intubation on November 3.  He was extubated on November 9.  He is extremely weak, unable to control copious secretions.  Appears to continue to be aspirating.  Palliative medicine consulted for goals of care.    Length of Stay: 20   Vital Signs: BP (!) 137/106 (BP Location: Right Wrist)   Pulse (!) 101   Temp 98.1 F (36.7 C) (Oral)   Resp (!) 24   Ht 5\' 11"  (1.803 m)   Wt 103.4 kg   SpO2 90%   BMI 31.79 kg/m  SpO2: SpO2: 90 % O2 Device: O2 Device: Nasal Cannula O2 Flow Rate: O2 Flow Rate (L/min): 2 L/min       Palliative Assessment/Data:  10%    Palliative Care Plan    Recommendations/Plan:  Patient with delirium, no PO intake.  PMT to reassess daily, but very concerned patient is near EOL.  Family discussion about moving to comfort measures.  Family initially agreed and then reconsidered.  Needs more information/time/and Palliative follow.  Attending MD please communicate with family daily.  Family very  concerned about loss of patient's gold wedding band.  RN calling each floor to try to track it down.  Code Status:  DNR  Prognosis:   Hours - Days   Discharge Planning:  Anticipated Hospital Death  Care plan was discussed with 3 children, RN, attending MD.  Thank you for allowing the Palliative Medicine Team to assist in the care of this patient.  Total time spent:  65 min.     Greater than 50%  of this time was spent counseling and coordinating care related to the above assessment and plan.  , PA-C Palliative Medicine  Please contact Palliative MedicineTeam phone at [phone] for questions and concerns between 7 am - 7 pm.   Please see AMION for individual provider pager numbers.

## 2020-07-12 NOTE — Progress Notes (Signed)
CSW received call from patient's son expressing concern about a meeting requested with the family and if patient could have a different MD. CSW suggested he contact the person who mentioned the meeting as CSW is unaware of it and as for obtaining a new MD, CSW made him aware the hospitalist will change tomorrow.   Gregory Nouri LCSW

## 2020-07-12 NOTE — Progress Notes (Signed)
Nutrition Follow-up  DOCUMENTATION CODES:   Severe malnutrition in context of acute illness/injury  INTERVENTION:  Monitor for results of GOC discussions/nutrition plan of care. If pt/family wish for pt to continue receiving treatment, recommend TF be reinitiated via Cortrak as pt remains NPO. Consider: Vital 1.5 at 60 ml/hr Pro-Source 45 mL TID Provides 2280 kcals, 130 g of protein, 1094 mL of free water   If pt transitions to comfort measures, then food should be a source of comfort and Cortrak may be removed.     NUTRITION DIAGNOSIS:   Severe Malnutrition related to acute illness (COVID) as evidenced by moderate muscle depletion, energy intake < or equal to 50% for > or equal to 5 days.  ongoing  GOAL:   Patient will meet greater than or equal to 90% of their needs  Not met now that TF has been discontinued   MONITOR:   Vent status, Skin, TF tolerance, Weight trends, Labs, I & O's  REASON FOR ASSESSMENT:   Consult Assessment of nutrition requirement/status  ASSESSMENT:   79 y.o. male with medical history significant of Spondylosis, gout, GERD, HTn, p A.fib, iron defficiency anemia, heavy alcohol abuse, CAD, HLD, right bundle branch block, history of small bowel obstruction presents with shortness of breath and weakness. Pt diagnosed with COVID-19 pneumonia along with possible aspiration pneumonia, also had evidence of severe dehydration and AKI.   10/27 Admitted 11/02 ENT evaluated for oral denture mucositis, GI consult for ?GI bleed 11/03 Resp arrest, intubated 11/08 Cortrak placed 11/09 Extubated  Palliative met with pt's family today to discuss pt's prognosis and concern that pt is nearing end of life. Pt's family initially agreed to proceed with comfort measures; however, pt's family later called saying they had changed their minds. Per PMT, discussions are ongoing and situation will be reassessed daily by PMT. Anticipated hospital death. If pt/family wish for pt  to continue to receive treatment, recommend TF orders be reinitiated. If pt/family elect for comfort measures, then food should be a source of comfort. Unable to order oral nutrition supplements as pt does not have diet order.   UOP: 738m x24 hours  Labs and medications reviewed.   Diet Order:   Diet Order    None      EDUCATION NEEDS:   Not appropriate for education at this time  Skin:  Skin Assessment: Skin Integrity Issues: Skin Integrity Issues:: DTI DTI: coccyx  Last BM:  11/14 rectal tube  Height:   Ht Readings from Last 1 Encounters:  07/10/20 _0  (1.803 m)    Weight:   Wt Readings from Last 1 Encounters:  07/12/20 103.4 kg    BMI:  Body mass index is 31.79 kg/m.  Estimated Nutritional Needs:   Kcal:  2000-2300 kcals  Protein:  120-135 g  Fluid:  >/= 2 L/day    ALarkin Ina MS, RD, LDN RD pager number and weekend/on-call pager number located in [place]

## 2020-07-12 NOTE — Progress Notes (Signed)
Physical Therapy Treatment Patient Details Name: Gregory Wheeler MRN: [id number] DOB: 1941/01/22 Today's Date: 07/12/2020    History of Present Illness Pt is 79 yo male presenting to ED 10/27 with generalized weakness, poor food intake, and confusion. COVID positive. 11/3 pt with respiratory arrest and intubated also noted to have GIB. 11/4 EGD performed. Extubated 11/9. PMH including arthritis, A-fib, HTN, AKI, alcohol abuse, and CAD with s/p stenting of L anterior descending artery.    PT Comments    Patient awake and alert throughout session. Voice extremely weak and unable to understand his limited attempts to verbalize. Not using head nods/shakes for yes/no questions. Unable to follow any single step commands with verbal, visual, and tactile cues. Allowed PROM x 4 extremities, however at no point attempted to assist with moving his limbs. Patient repositioned in upright sitting position in bed with HOB 48 and heels floating above mattress. PT to continue efforts as he was ambulatory PTA.     Follow Up Recommendations  LTACH     Equipment Recommendations  Other (comment) (TBD at next venue)    Recommendations for Other Services       Precautions / Restrictions Precautions Precautions: Fall Precaution Comments:  flexi-seal, bilateral mittens, cortrak    Mobility  Bed Mobility Overal bed mobility: Needs Assistance             General bed mobility comments: +2 total assist to scoot to [facility]; not following commands for repositioning/rolling  Transfers                    Ambulation/Gait                 Stairs             Wheelchair Mobility    Modified Rankin (Stroke Patients Only)       Balance                                            Cognition Arousal/Alertness: Awake/alert Behavior During Therapy: Flat affect (stares past PT at times; other times good eye contact) Overall Cognitive Status: Difficult to assess                    Orientation Level:  (unable to assess; non-verbal)     Following Commands:  (did not follow any 1 step commands)              Exercises Other Exercises Other Exercises: PROM x 4 extremities in effort to get pt to move towards AAROM (which unfortunately he did not). He was alert throughout the session, but could not follow even "close eyes" or "stick out your tongue" Noted developing bil knee flexion tightness. Able to get -10 degrees with PROM    General Comments General comments (skin integrity, edema, etc.): on 2L; sats at lowest 90% with repositioning and HOB lowered, RR 22-28      Pertinent Vitals/Pain Pain Assessment: Faces Faces Pain Scale: No hurt    Home Living                      Prior Function            PT Goals (current goals can now be found in the care plan section) Acute Rehab PT Goals Patient Stated Goal: Get to my truck and go home Time  For Goal Achievement: 07/18/20 Potential to Achieve Goals: Fair Progress towards PT goals: Not progressing toward goals - comment    Frequency    Min 2X/week      PT Plan Current plan remains appropriate;Equipment recommendations need to be updated    Co-evaluation              AM-PAC PT "6 Clicks" Mobility   Outcome Measure  Help needed turning from your back to your side while in a flat bed without using bedrails?: Total Help needed moving from lying on your back to sitting on the side of a flat bed without using bedrails?: Total Help needed moving to and from a bed to a chair (including a wheelchair)?: Total Help needed standing up from a chair using your arms (e.g., wheelchair or bedside chair)?: Total Help needed to walk in hospital room?: Total Help needed climbing 3-5 steps with a railing? : Total 6 Click Score: 6    End of Session   Activity Tolerance: Other (comment) (pt alert, however not following commands) Patient left: in bed;with call bell/phone within  reach;with bed alarm set;with restraints reapplied (bil mitts; semi-chair position) Nurse Communication: Mobility status PT Visit Diagnosis: Muscle weakness (generalized) (M62.81);Unsteadiness on feet (R26.81);Other abnormalities of gait and mobility (R26.89)     Time: 0539-7673 PT Time Calculation (min) (ACUTE ONLY): 23 min  Charges:  $Therapeutic Exercise: 23-37 mins                      Jerolyn Center, PT Pager [phone]    Zena Amos 07/12/2020, 9:50 AM

## 2020-07-13 DIAGNOSIS — B9689 Other specified bacterial agents as the cause of diseases classified elsewhere: Secondary | ICD-10-CM

## 2020-07-13 LAB — GLUCOSE, CAPILLARY
Glucose-Capillary: 88 mg/dL (ref 70–99)
Glucose-Capillary: 86 mg/dL (ref 70–99)
Glucose-Capillary: 81 mg/dL (ref 70–99)

## 2020-07-13 MED ORDER — SCOPOLAMINE 1 MG/3DAYS TD PT72: 1.0000 | MEDICATED_PATCH | TRANSDERMAL | 1 refills | Status: AC

## 2020-07-13 MED ORDER — RESOURCE THICKENUP CLEAR PO POWD
ORAL | 1 refills | Status: DC
Start: 2020-07-13 — End: 2020-09-16

## 2020-07-13 MED ORDER — CEFTRIAXONE IV (FOR PTA / DISCHARGE USE ONLY): 2.0000 g | Freq: Two times a day (BID) | INTRAVENOUS | 0 refills | Status: AC

## 2020-07-13 MED ORDER — ONDANSETRON 4 MG PO TBDP: 4.0000 mg | ORAL_TABLET | Freq: Four times a day (QID) | ORAL | 0 refills | Status: DC | PRN

## 2020-07-13 MED ORDER — ALLOPURINOL 300 MG PO TABS
300.0000 mg | ORAL_TABLET | Freq: Every day | ORAL | 0 refills | Status: DC
Start: 2020-07-13 — End: 2021-03-23

## 2020-07-13 MED ORDER — AMPICILLIN IV (FOR PTA / DISCHARGE USE ONLY): 2.0000 g | INTRAVENOUS | 0 refills | Status: AC

## 2020-07-13 MED ORDER — GLYCOPYRROLATE 1 MG PO TABS
1.0000 mg | ORAL_TABLET | ORAL | 0 refills | Status: DC | PRN
Start: 2020-07-13 — End: 2020-09-16

## 2020-07-13 MED ORDER — METOPROLOL TARTRATE 25 MG PO TABS: 25.0000 mg | ORAL_TABLET | Freq: Two times a day (BID) | ORAL | 0 refills | Status: DC

## 2020-07-13 MED ORDER — PANTOPRAZOLE SODIUM 40 MG PO TBEC: 40.0000 mg | DELAYED_RELEASE_TABLET | Freq: Every day | ORAL | 0 refills | Status: DC

## 2020-07-13 MED ORDER — AMLODIPINE BESYLATE 10 MG PO TABS
10.0000 mg | ORAL_TABLET | Freq: Every day | ORAL | 0 refills | Status: DC
Start: 2020-07-13 — End: 2020-09-16

## 2020-07-13 MED ORDER — RESOURCE THICKENUP CLEAR PO POWD
ORAL | Status: DC | PRN
  Filled 2020-07-13: qty 125

## 2020-07-13 NOTE — Care Management Important Message (Signed)
Important Message  Patient Details  Name: Gregory Wheeler MRN: [id number] Date of Birth: 1941/01/19   Medicare Important Message Given:  Yes - Important Message mailed due to current National Emergency  Verbal consent obtained due to current National Emergency  Relationship to patient: Child Contact Name: Christen Bame Call Date: 07/13/20  Time: 1425 Phone: [phone] Outcome: Spoke with contact Important Message mailed to: Patient address on file    Orson Aloe 07/13/2020, 2:25 PM

## 2020-07-13 NOTE — Progress Notes (Signed)
Physical Therapy Treatment Patient Details Name: Gregory Wheeler MRN: [id number] DOB: [birth year] Today's Date: 07/13/2020    History of Present Illness Pt is 79 yo male presenting to ED 10/27 with generalized weakness, poor food intake, and confusion. COVID positive. 11/3 pt with respiratory arrest and intubated also noted to have GIB. 11/4 EGD performed. Extubated 11/9. PMH including arthritis, A-fib, HTN, AKI, alcohol abuse, and CAD with s/p stenting of L anterior descending artery.    PT Comments    Pt was seen for bed mobility and could not get him OOB to stand.  Pt is not exerting an effective effort to stand up with assistance.  Pt was assisted back to bed with cues for body mechanics, and repositioned for better upright posture and breathing.  Follow for goals of PT and work on standing balance skills and LE strength.    Follow Up Recommendations  LTACH     Equipment Recommendations  Other (comment)    Recommendations for Other Services       Precautions / Restrictions Precautions Precautions: Fall Precaution Comments: flexi seal, foley cath Restrictions Weight Bearing Restrictions: No    Mobility  Bed Mobility Overal bed mobility: Needs Assistance Bed Mobility: Supine to Sit;Sit to Supine     Supine to sit: Max assist Sit to supine: Max assist;+2 for physical assistance;+2 for safety/equipment   General bed mobility comments: dependent assist to scoot up in bed  Transfers                 General transfer comment: Pt would not attempt to stand after trying to get to chair, but instead resisted with trunk to return to bed  Ambulation/Gait                 Stairs             Wheelchair Mobility    Modified Rankin (Stroke Patients Only)       Balance Overall balance assessment: Needs assistance Sitting-balance support: Bilateral upper extremity supported;Feet supported Sitting balance-Leahy Scale: Poor                                       Cognition Arousal/Alertness: Awake/alert Behavior During Therapy: Flat affect Overall Cognitive Status: Difficult to assess Area of Impairment: Attention;Following commands;Awareness;Problem solving                   Current Attention Level: Selective   Following Commands: Follows one step commands inconsistently (did not follow commands well)   Awareness: Anticipatory Problem Solving: Slow processing;Requires verbal cues;Requires tactile cues General Comments: pt is not verbal with understandable speech      Exercises      General Comments General comments (skin integrity, edema, etc.): O2 via cannula with sats >90%      Pertinent Vitals/Pain Pain Assessment: Faces Faces Pain Scale: No hurt    Home Living Family/patient expects to be discharged to:: Private residence Living Arrangements: Alone Available Help at Discharge: Family;Available PRN/intermittently Type of Home: House     Home Layout: One level Home Equipment: Walker - 2 wheels;Other (comment) Additional Comments: Per pt, wife passed away two years ago    Prior Function Level of Independence: Needs assistance  Gait / Transfers Assistance Needed: not able to stand yet       PT Goals (current goals can now be found in the care plan section) Acute Rehab PT  Goals Patient Stated Goal: get to chair    Frequency    Min 2X/week      PT Plan      Co-evaluation              AM-PAC PT "6 Clicks" Mobility   Outcome Measure  Help needed turning from your back to your side while in a flat bed without using bedrails?: A Lot Help needed moving from lying on your back to sitting on the side of a flat bed without using bedrails?: A Lot Help needed moving to and from a bed to a chair (including a wheelchair)?: Total Help needed standing up from a chair using your arms (e.g., wheelchair or bedside chair)?: Total Help needed to walk in hospital room?: Total Help needed  climbing 3-5 steps with a railing? : Total 6 Click Score: 8    End of Session   Activity Tolerance: Patient limited by fatigue Patient left: in bed;with call bell/phone within reach;with family/visitor present;with nursing/sitter in room Nurse Communication: Mobility status PT Visit Diagnosis: Muscle weakness (generalized) (M62.81);Unsteadiness on feet (R26.81);Other abnormalities of gait and mobility (R26.89)     Time: 3570-1779 PT Time Calculation (min) (ACUTE ONLY): 18 min  Charges:  $Therapeutic Activity: 8-22 mins                  Ivar Drape 07/13/2020, 8:55 PM  Samul Dada, PT MS Acute Rehab Dept. Number: [facility] [id number] and [facility] [phone]

## 2020-07-13 NOTE — Progress Notes (Signed)
This chaplain responded to PMT consult for spiritual care.  The chaplain was updated on the Pt. by PMT before the visit   The chaplain understands the Pt. and family are receiving spiritual care from their faith community.  At this time the family has declined a chaplain visit.  This chaplain continues to be available as needed for F/U  spiritual care.

## 2020-07-13 NOTE — Progress Notes (Addendum)
SATURATION QUALIFICATIONS: (This note is used to comply with regulatory documentation for home oxygen)  Patient Saturations on Room Air at Rest = 95%   Please briefly explain why patient needs home oxygen:  Patient is not ambulatory, sats remain >88% on RA.

## 2020-07-13 NOTE — Discharge Instructions (Signed)
Follow with Primary MD Jacqualine Mau, NP in 3 days   Get CBC, CMP, checked  by Primary MD next visit.    Activity: As tolerated with Full fall precautions use walker/cane & assistance as needed   Disposition Home   Diet: Diet consistency per SLP, patient with high risk for aspiration,   On your next visit with your primary care physician please Get Medicines reviewed and adjusted.   Please request your Prim.MD to go over all Hospital Tests and Procedure/Radiological results at the follow up, please get all Hospital records sent to your Prim MD by signing hospital release before you go home.   If you experience worsening of your admission symptoms, develop shortness of breath, life threatening emergency, suicidal or homicidal thoughts you must seek medical attention immediately by calling 911 or calling your MD immediately  if symptoms less severe.  You Must read complete instructions/literature along with all the possible adverse reactions/side effects for all the Medicines you take and that have been prescribed to you. Take any new Medicines after you have completely understood and accpet all the possible adverse reactions/side effects.   Do not drive, operating heavy machinery, perform activities at heights, swimming or participation in water activities or provide baby sitting services if your were admitted for syncope or siezures until you have seen by Primary MD or a Neurologist and advised to do so again.  Do not drive when taking Pain medications.    Do not take more than prescribed Pain, Sleep and Anxiety Medications  Special Instructions: If you have smoked or chewed Tobacco  in the last 2 yrs please stop smoking, stop any regular Alcohol  and or any Recreational drug use.  Wear Seat belts while driving.   Please note  You were cared for by a hospitalist during your hospital stay. If you have any questions about your discharge medications or the care you received while  you were in the hospital after you are discharged, you can call the unit and asked to speak with the hospitalist on call if the hospitalist that took care of you is not available. Once you are discharged, your primary care physician will handle any further medical issues. Please note that NO REFILLS for any discharge medications will be authorized once you are discharged, as it is imperative that you return to your primary care physician (or establish a relationship with a primary care physician if you do not have one) for your aftercare needs so that they can reassess your need for medications and monitor your lab values.

## 2020-07-13 NOTE — Discharge Summary (Addendum)
Gregory Wheeler, is a 79 y.o. male  DOB 09/19/1940  MRN [id number].  Admission date:  06/22/2020  Admitting Physician  Candee Furbish, MD  Discharge Date:  07/13/2020   Primary MD  [name], NP  Recommendations for primary care physician for things to follow:  -Patient remains at high risk for aspiration, he will need continuous monitoring and observation follow-up with SLP as part of his home health, diet to be managed and guided by them. -Consider palliative care consult as an outpatient if patient shows no improvement.  He is extremely frail, deconditioned and with dysphagia and high risk for readmission. -Patient with fluctuating oxygen requirement, but he is recovering from Covid, as well he is aspiration risk, for which he will certainly need home oxygen to be arranged on discharge even though at some point he maintained good oxygen saturations on room air, but he intermittently dips where he will need 2 L nasal cannula.   Admission Diagnosis  Hypokalemia [E87.6] Lactic acidosis [E87.2] Encephalopathy acute [G93.40] Pneumonia due to COVID-19 virus [U07.1, J12.82] COVID-19 [U07.1]   Discharge Diagnosis  Hypokalemia [E87.6] Lactic acidosis [E87.2] Encephalopathy acute [G93.40] Pneumonia due to COVID-19 virus [U07.1, J12.82] COVID-19 [U07.1]   Active Problems:   Coronary artery disease   Hypertension   RBBB (right bundle branch block)   AKI (acute kidney injury) ([place])   Chronic atrial fibrillation (HCC)   Alcohol abuse   Long term (current) use of anticoagulants [Z79.01]   Pneumonia due to COVID-19 virus   Dehydration   Acute respiratory failure due to COVID-19 (HCC)   Hypokalemia   Acute metabolic encephalopathy   Denture stomatitis   Protein-calorie malnutrition, severe   Encephalopathy acute   Pneumonia   Advanced care planning/counseling discussion   Goals of care,  counseling/discussion   Palliative care encounter   Respiratory arrest Gregory Wheeler)      Past Medical History:  Diagnosis Date  . Arthritis   . Atrial fibrillation ([place]) 01/31/2015  . Chronic gout   . Coronary artery disease 12/2007   STATUS POST STENTING OF THE LEFT ANTERIOR DESCENDING ARTERY  . COVID-19   . Dyslipidemia   . Gout   . Hiatal hernia   . Hyperlipidemia   . Hypertension   . LBP (low back pain)   . Partial small bowel obstruction ([place])   . RBBB (right bundle branch block)   . SBO (small bowel obstruction) (HCC)    PARTIAL  . Second degree AV block, Mobitz type I   . Urinary tract infection    Escherichia coli urinary tract infection    Past Surgical History:  Procedure Laterality Date  . CARDIAC CATHETERIZATION    . ESOPHAGOGASTRODUODENOSCOPY N/A 06/30/2020   Procedure: ESOPHAGOGASTRODUODENOSCOPY (EGD);  Surgeon: Clarene Essex, MD;  Location: [facility];  Service: Endoscopy;  Laterality: N/A;  . HERNIA REPAIR     INGUINAL  . ROTATOR CUFF REPAIR         History of present illness and  Wheeler Course:  Kindly see H&P for history of present illness and admission details, please review complete Labs, Consult reports and Test reports for all details in brief  HPI  from the history and physical done on the day of admission 06/22/2020  Gregory Wheeler is a 79 y.o. male with medical history significant of Spondylosis, gout, GERD, HTn, p A.fib, iron defficiency anemia, CAD, HLD, right bundle branch block, history of small bowel obstruction, history of second-degree AV block Mobitz type I    Presented with   Confusion from home.  Patient tested positive for Covid 10 days ago has not had any fever vomiting but has had decreased p.o. intake.  Reports some sore throat and cough not eating and drinking.  Family brought him to emergency department because they were concerned about dehydration and confusion. No confusion at baseline Apparently fully vaccinated in  March. Patient did endorse some diarrhea.  He used to be drinking heavy but came off for the past 2 weeks His wife passed away 2 years ago and he started to drink very heavy  his last EtOH was 4 days and it was half a beer  Patient did have a small fall today but family states no head trauma Although fall was unwitnessed  Infectious risk factors:  Reports, anosmia/change in taste, severe fatigue    KNOWN COVID POSITIVE   Has  been vaccinated against [place] [facility] Course   Acute Hypoxic Resp. Failure due to Acute Covid 19 Viral Pneumonitis along with aspiration pneumonia causing respiratory arrest ongoing aspiration of oral secretions  - he finished his COVID-19 specific treatment with steroids and Remdesivir, he was briefly intubated due to respiratory arrest and finally extubated on 07/06/2020.  At this time post extubation on 07/06/2020 he seems to have difficulty controlling his oral secretions and was aspirating his oral secretions, he was on tube feed  through NG tube, extremely weak and deconditioned with extremely poor prognosis and high chances of ongoing aspiration.  Patient's wishes he is DNR, palliative care consulted.  Oral secretions mildly improved after addition of scopolamine patch which has now been changed to Robinul as needed, also care has been discussed extensively with family by hospitalist and palliative medicine, please see discussion below. -Patient with fluctuating oxygen requirement, but he is recovering from Covid, as well he is aspiration risk, for which he will certainly need home oxygen to be arranged on discharge even though at some point he maintained good oxygen saturations on room air, but he intermittently dips where he will need 2 L nasal cannula.   Enterococcal bacteremia.   -He is currently on 6 weeks total of IV Rocephin and ampicillin through PICC line with a tentative stop date of 08/04/2020.  Poor candidate for TEE.  He has  been seen by ID this admission.  For stopping his antibiotic he has to see ID.  Upper GI bleed this admission.  - Seen by [name] earlier, underwent EGD on 06/30/2020 showing possible gastritis, he has been on full dose Lovenox, he will be resumed on Xarelto on discharge  History of CAD, right bundle branch block, paroxysmal A. Fib. -resume Home Xarelto on discharge.  Urinary retention.  On Foley,   Essential hypertension currently on Norvasc, Nitropaste along with beta-blocker.  Excessive oral secretions.  Added scopolamine patch, as well on as needed glycopyrrolate   Alcohol abuse with DTs earlier this admission.  Seems to be stable right now.  Dysphagia due to extreme weakness and deconditioning.  I have discussed with family at length, current recommendation is to keep following with SLP as an outpatient, family understand patient is high risk for aspiration, and will need continued close monitoring for that, .  Third spacing of fluid and edema.     Discharge Condition: -Patient will be discharged home with home health, he remains DNR, but overall is significantly deconditioned, frail and remains with dysphagia, I have discussed at length with son and family, they understand these risks, and they are wishing for home to go home with home health, and keep trying with current measure, close SLP follow-up and home health.   Follow UP   Follow-up Information    [facility] Follow [facility].   Specialty: Home Health Services Why: The home health agency will contact you for the first home visit. Contact information: [address] [place] [address] [phone]        [name], NP Follow up.   Specialty: Nurse Practitioner Contact information: [address] [place] [address] [phone]                 Discharge Instructions  and  Discharge Medications     Discharge Instructions    [facility] pharmacist to adjust dose for Vancomycin, Aminoglycosides and other anti-infective therapies as requested by physician.   Complete by: As directed    [facility] to provide Cath Flo 28m   Complete by: As directed    Administer for PICC line occlusion and as ordered by physician for other access device issues.   Anaphylaxis Kit: Provided to treat any anaphylactic reaction to the medication being provided to the patient if First Dose or when requested by physician   Complete by: As directed    Epinephrine 140mml vial / amp: Administer 0.48m45m0.48ml55mubcutaneously once for moderate to severe anaphylaxis, nurse to call physician and pharmacy when reaction occurs and call 911 if needed for immediate care   Diphenhydramine 50mg39mIV vial: Administer 25-50mg 38mM PRN for first dose reaction, rash, itching, mild reaction, nurse to call physician and pharmacy when reaction occurs   Sodium Chloride 0.9% NS 500ml I60mdminister if needed for hypovolemic blood pressure drop or as ordered by physician after call to physician with anaphylactic reaction   Change dressing on IV access line weekly and PRN   Complete by: As directed    Diet - low sodium heart healthy   Complete by: As directed    Discharge instructions   Complete by: As directed    Follow with Primary MD Edelen,[name] 3 days   Get CBC, CMP, checked  by Primary MD next visit.    Activity: As tolerated with Full fall precautions use walker/cane & assistance as needed   Disposition Home   Diet: Diet consistency per SLP, patient with high risk for aspiration,   On your next visit with your primary care physician please Get Medicines reviewed and adjusted.   Please request your Prim.MD to go over all Wheeler Tests and Procedure/Radiological results at the follow up, please get all Wheeler records sent to your Prim MD by signing Wheeler release before you go home.   If you experience worsening of your  admission symptoms, develop shortness of breath, life threatening emergency, suicidal or homicidal thoughts you must seek medical attention immediately by calling 911 or calling your MD immediately  if symptoms less severe.  You Must read  complete instructions/literature along with all the possible adverse reactions/side effects for all the Medicines you take and that have been prescribed to you. Take any new Medicines after you have completely understood and accpet all the possible adverse reactions/side effects.   Do not drive, operating heavy machinery, perform activities at heights, swimming or participation in water activities or provide baby sitting services if your were admitted for syncope or siezures until you have seen by Primary MD or a Neurologist and advised to do so again.  Do not drive when taking Pain medications.    Do not take more than prescribed Pain, Sleep and Anxiety Medications  Special Instructions: If you have smoked or chewed Tobacco  in the last 2 yrs please stop smoking, stop any regular Alcohol  and or any Recreational drug use.  Wear Seat belts while driving.   Please note  You were cared for by a hospitalist during your [facility] stay. If you have any questions about your discharge medications or the care you received while you were in the Wheeler after you are discharged, you can call the unit and asked to speak with the hospitalist on call if the hospitalist that took care of you is not available. Once you are discharged, your primary care physician will handle any further medical issues. Please note that NO REFILLS for any discharge medications will be authorized once you are discharged, as it is imperative that you return to your primary care physician (or establish a relationship with a primary care physician if you do not have one) for your aftercare needs so that they can reassess your need for medications and monitor your lab values.   Flush IV access with  Sodium Chloride 0.9% and Heparin 10 units/ml or 100 units/ml   Complete by: As directed    Home infusion instructions - [facility]   Complete by: As directed    Instructions: Flush IV access with Sodium Chloride 0.9% and Heparin 10units/ml or 100units/ml   Change dressing on IV access line: Weekly and PRN   Instructions Cath Flo 13m: Administer for PICC Line occlusion and as ordered by physician for other access device   [facility] pharmacist to adjust dose for: Vancomycin, Aminoglycosides and other anti-infective therapies as requested by physician   Increase activity slowly   Complete by: As directed    Method of administration may be changed at the discretion of home infusion pharmacist based upon assessment of the patient and/or caregiver's ability to self-administer the medication ordered   Complete by: As directed    No wound care   Complete by: As directed      Allergies as of 07/13/2020      Reactions   Lorazepam Other (See Comments)   Extreme delirium       Medication List    STOP taking these medications   metoprolol succinate 50 MG 24 hr tablet Commonly known as: TOPROL-XL     TAKE these medications   acetaminophen 325 MG tablet Commonly known as: TYLENOL Take 650 mg by mouth 2 (two) times daily.   allopurinol 300 MG tablet Commonly known as: ZYLOPRIM Take 1 tablet (300 mg total) by mouth at bedtime.   ALPRAZolam 0.5 MG tablet Commonly known as: XANAX Take 0.5-1 mg by mouth at bedtime.   ALPRAZolam 0.25 MG tablet Commonly known as: XANAX Take 1 tablet (0.25 mg total) by mouth 3 (three) times daily as needed for anxiety.   amLODipine 10 MG tablet Commonly known  as: NORVASC Place 1 tablet (10 mg total) into feeding tube daily.   ampicillin  IVPB Inject 2 g into the vein every 4 (four) hours for 25 days. As a continuous infusion  Indication:  Bacteremia First Dose:no Last Day of Therapy:  08/03/20 Labs - Once weekly:  CBC/D and  BMP, Labs - Every other week:  ESR and CRP Method of administration: Ambulatory Pump (Continuous Infusion) Method of administration may be changed at the discretion of home infusion pharmacist based upon assessment of the patient and/or caregiver's ability to self-administer the medication ordered.   ascorbic acid 1000 MG tablet Commonly known as: VITAMIN C Take 1,000 mg by mouth daily.   cefTRIAXone  IVPB Commonly known as: ROCEPHIN Inject 2 g into the vein every 12 (twelve) hours for 25 days. Indication:  Bacteremia First Dose: no Last Day of Therapy:  08/03/20 Labs - Once weekly:  CBC/D and BMP, Labs - Every other week:  ESR and CRP Method of administration: IV Push Method of administration may be changed at the discretion of home infusion pharmacist based upon assessment of the patient and/or caregiver's ability to self-administer the medication ordered.   famotidine 40 MG tablet Commonly known as: PEPCID Take 40 mg by mouth daily as needed for heartburn or indigestion.   folic acid 1 MG tablet Commonly known as: FOLVITE Take 2 mg by mouth daily.   glycopyrrolate 1 MG tablet Commonly known as: ROBINUL Take 1 tablet (1 mg total) by mouth every 4 (four) hours as needed (excessive secretions).   iron polysaccharides 150 MG capsule Commonly known as: NIFEREX Take 1 capsule (150 mg total) by mouth daily.   metoprolol tartrate 25 MG tablet Commonly known as: LOPRESSOR Take 1 tablet (25 mg total) by mouth 2 (two) times daily.   ondansetron 4 MG disintegrating tablet Commonly known as: ZOFRAN-ODT Take 1 tablet (4 mg total) by mouth every 6 (six) hours as needed for nausea.   pantoprazole 40 MG tablet Commonly known as: Protonix Take 1 tablet (40 mg total) by mouth daily.   rivaroxaban 20 MG Tabs tablet Commonly known as: Xarelto Take 1 tablet (20 mg total) by mouth daily with supper.   scopolamine 1 MG/3DAYS Commonly known as: Transderm-Scop (1.5 MG) Place 1 patch (1.5  mg total) onto the skin every 3 (three) days for 14 days.   zinc gluconate 50 MG tablet Take 50 mg by mouth daily.            Durable Medical Equipment  (From admission, onward)         Start     Ordered   07/13/20 1218  For home use only DME oxygen  Once       Question Answer Comment  Length of Need Lifetime   Mode or (Route) Nasal cannula   Liters per Minute 2   Frequency Continuous (stationary and portable oxygen unit needed)   Oxygen delivery system Gas      07/13/20 1217   07/13/20 1149  For home use only DME Other see comment  Once       Comments: hoyer  Question:  Length of Need  Answer:  Lifetime   07/13/20 1148   07/13/20 1031  For home use only DME Suction  Once       Question:  Suction  Answer:  Oral   07/13/20 1030   07/13/20 1001  For home use only DME Wheeler bed  Once       Question Answer  Comment  Length of Need Lifetime   Patient has (list medical condition): BACTEREMIA, DYSPHAGEA   Bed type Semi-electric      07/13/20 1000   06/24/20 1238  For home use only DME 3 n 1  Once        06/24/20 1237           Discharge Care Instructions  (From admission, onward)         Start     Ordered   07/13/20 0000  Change dressing on IV access line weekly and PRN  (Home infusion instructions - [facility] )        07/13/20 1009            Diet and Activity recommendation: See Discharge Instructions above   Consults obtained - Pall Care, PCCM, GI   Major procedures and Radiology Reports - PLEASE review detailed and final reports for all details, in brief -      DG Abd 1 View  Result Date: 06/29/2020 CLINICAL DATA:  OG tube placement. EXAM: ABDOMEN - 1 VIEW COMPARISON:  Abdominal radiographs June 28, 2020, chest radiograph June 27, 2020 FINDINGS: Orogastric tube courses below the diaphragm with the tip projecting in the expected region of the stomach. Side port is below the gastroesophageal junction. Partially imaged airspace  opacities in the lung bases, better characterized on recent chest radiograph. Partially imaged scattered retained contrast within colonic diverticula in the upper abdomen. IMPRESSION: 1. Orogastric tube courses below the diaphragm with the tip projecting in the expected region of the stomach. 2. Bibasilar airspace opacities. Electronically Signed   By: Margaretha Sheffield MD   On: 06/29/2020 11:21   CT HEAD WO CONTRAST  Result Date: 06/30/2020 CLINICAL DATA:  Unresponsive since respiratory arrest yesterday EXAM: CT HEAD WITHOUT CONTRAST TECHNIQUE: Contiguous axial images were obtained from the base of the skull through the vertex without intravenous contrast. COMPARISON:  CT 06/22/2020 FINDINGS: Brain: No evidence of acute infarction, hemorrhage, hydrocephalus, extra-axial collection, visible mass lesion or mass effect. Symmetric prominence of the ventricles, cisterns and sulci compatible with parenchymal volume loss. Mixed patchy and confluent areas of white matter hypoattenuation are most compatible with chronic microvascular angiopathy. Vascular: Atherosclerotic calcification of the carotid siphons and intradural vertebral arteries. No hyperdense vessel. Skull: No scalp swelling or hematoma. No calvarial fracture or suspicious osseous lesion. Sinuses/Orbits: Minimal thickening in the ethmoids and maxillary sinuses with pneumatized secretions and layering air-fluid levels in the sphenoid sinuses possibly related to instrumentation. Bilateral mastoid effusions are present as well. Middle ear cavities are clear. Orbital structures are unremarkable aside from prior lens extractions. Other: None. IMPRESSION: 1. No CT evidence of acute large territory or cortically based infarct nor other acute intracranial abnormality. If there is persisting concern for acute infarction, MRI is more sensitive and specific for early features of ischemia. 2. Moderate parenchymal volume loss and chronic microvascular angiopathy. 3.  Mural thickening in the paranasal sinuses with layering fluid in the sphenoid sinuses and bilateral mastoid effusions. Possibly related to instrumentation/intubation. Electronically Signed   By: Lovena Le M.D.   On: 06/30/2020 01:44   CT HEAD WO CONTRAST  Result Date: 06/22/2020 CLINICAL DATA:  Delirium. EXAM: CT HEAD WITHOUT CONTRAST TECHNIQUE: Contiguous axial images were obtained from the base of the skull through the vertex without intravenous contrast. COMPARISON:  Head CT 05/14/2016 FINDINGS: Brain: No intracranial hemorrhage, mass effect, or midline shift. Age related atrophy. No hydrocephalus. The basilar cisterns are patent. Mild to moderate  chronic small vessel ischemia with slight progression from 2017. No evidence of territorial infarct or acute ischemia. No extra-axial or intracranial fluid collection. Vascular: Atherosclerosis of skullbase vasculature without hyperdense vessel or abnormal calcification. Skull: No fracture or focal lesion. Sinuses/Orbits: Opacification of the lower right greater than left mastoid air cells, progressed from 2017. Small fluid levels in the left and right side of sphenoid sinus. No acute orbital abnormality. Bilateral cataract resection. Other: None. IMPRESSION: 1. No acute intracranial abnormality. 2. Age related atrophy and chronic small vessel ischemia. 3. Opacification of the lower right greater than left mastoid air cells likely mastoid effusion, progressed from 2017. Small fluid levels in the left and right side of the sphenoid sinus. Electronically Signed   By: Keith Rake M.D.   On: 06/22/2020 22:36   DG Chest Port 1 View  Result Date: 07/12/2020 CLINICAL DATA:  Shortness of breath. EXAM: PORTABLE CHEST 1 VIEW COMPARISON:  07/07/2020 FINDINGS: Stable cardiomegaly. Moderate hiatal hernia again noted. Feeding tube is seen entering the stomach. Left arm PICC line remains in appropriate position. Mild airspace disease is again seen in the left lower  lung, and through the peripheral aspect of the right long, but shows no significant change compared to prior study. No evidence of pneumothorax or pleural effusion. IMPRESSION: No significant change in mild bilateral airspace disease. Stable hiatal hernia. Stable cardiomegaly. Electronically Signed   By: Marlaine Hind M.D.   On: 07/12/2020 08:17   DG Chest Port 1 View  Result Date: 07/07/2020 CLINICAL DATA:  Shortness of breath.  COVID-19 positive EXAM: PORTABLE CHEST 1 VIEW COMPARISON:  July 05, 2020. FINDINGS: Feeding tube tip is below the diaphragm. Endotracheal tube no longer present. Central catheter tip is in the superior vena cava. No pneumothorax. Areas of ill-defined opacity present in the mid and lower lung regions with somewhat less opacity bilaterally compared to 2 days prior. Stable apical pleural thickening. Stable cardiac prominence with pulmonary vascularity normal. Sizable hiatal type hernia present. IMPRESSION: Patchy airspace opacity bilaterally, slightly less compared to 2 days prior. Stable cardiomegaly. Sizable hiatal type hernia. Feeding tube tip below diaphragm. Electronically Signed   By: Lowella Grip III M.D.   On: 07/07/2020 08:04   DG CHEST PORT 1 VIEW  Result Date: 07/05/2020 CLINICAL DATA:  Pneumonia EXAM: PORTABLE CHEST 1 VIEW COMPARISON:  Six days ago FINDINGS: Endotracheal tube with tip halfway between the clavicular heads and carina. Feeding tube at least reaches the stomach. Left PICC with tip at the SVC. Patchy bilateral pneumonia with progression the left base. No visible effusion or pneumothorax. Cardiomegaly. IMPRESSION: 1. Unremarkable hardware. 2. Multifocal pneumonia with progression at the left base. Electronically Signed   By: Monte Fantasia M.D.   On: 07/05/2020 06:14   DG CHEST PORT 1 VIEW  Result Date: 06/29/2020 CLINICAL DATA:  Intubation EXAM: PORTABLE CHEST 1 VIEW COMPARISON:  Two days ago FINDINGS: Endotracheal tube with tip between the  clavicular heads and carina. Enteric tube with side port seen over the stomach. Left PICC with tip near the SVC brachiocephalic junction. Patchy bilateral infiltrate. Cardiopericardial enlargement accentuated by rightward rotation. No visible effusion or pneumothorax. IMPRESSION: 1. Intubation with unremarkable hardware. 2. Atypical pneumonia with mild progression from 2 days ago. Electronically Signed   By: Monte Fantasia M.D.   On: 06/29/2020 05:07   DG CHEST PORT 1 VIEW  Result Date: 06/27/2020 CLINICAL DATA:  PICC in place EXAM: PORTABLE CHEST 1 VIEW COMPARISON:  06/27/2020 FINDINGS: Left arm PICC terminates  in the mid SVC. Subpleural patchy opacities in the lungs bilaterally, left lower lobe predominant, compatible with pneumonia in this patient with known COVID. No pleural effusion or pneumothorax. The heart is top-normal in size. IMPRESSION: Left arm PICC terminates in the mid SVC. Multifocal pneumonia in this patient with known COVID. Electronically Signed   By: Julian Hy M.D.   On: 06/27/2020 09:35   DG Chest Port 1 View  Result Date: 06/27/2020 CLINICAL DATA:  Shortness of breath, COVID-19 positive. EXAM: PORTABLE CHEST 1 VIEW COMPARISON:  June 23, 2020. FINDINGS: Stable cardiomegaly. Stable multiple airspace opacities are noted bilaterally consistent with multifocal pneumonia. No pneumothorax or pleural effusion is noted. Bony thorax is unremarkable. IMPRESSION: Stable bilateral multifocal pneumonia. Electronically Signed   By: Marijo Conception M.D.   On: 06/27/2020 08:07   DG Chest Port 1 View  Result Date: 06/23/2020 CLINICAL DATA:  Shortness of breath and COVID. EXAM: PORTABLE CHEST 1 VIEW COMPARISON:  06/22/2020 FINDINGS: 0846 hours. Low lung volumes. Progressive patchy ground-glass and consolidative airspace opacity in the lower lungs. The cardio pericardial silhouette is enlarged. No substantial pleural effusion. The visualized bony structures of the thorax show no acute  abnormality. Telemetry leads overlie the chest. IMPRESSION: Progressive bibasilar airspace disease consistent with multifocal pneumonia. Electronically Signed   By: Misty Stanley M.D.   On: 06/23/2020 09:04   DG Chest Port 1 View  Result Date: 06/22/2020 CLINICAL DATA:  COVID, altered mental status. EXAM: PORTABLE CHEST 1 VIEW COMPARISON:  10/19/2014. FINDINGS: Low lung volumes. Subtle airspace opacities in the peripheral lung bases. No visible pleural effusions or pneumothorax. Cardiomediastinal silhouette is within normal limits. No evidence of acute osseous abnormality. IMPRESSION: Subtle airspace opacities in the peripheral lung bases, concerning for multifocal pneumonia and consistent with reported COVID diagnosis. Electronically Signed   By: Margaretha Sheffield MD   On: 06/22/2020 11:39   DG Abd Portable 1V  Result Date: 06/28/2020 CLINICAL DATA:  Nausea, vomiting blood, COVID-19, atrial fibrillation, coronary artery disease, hypertension, former smoker EXAM: PORTABLE ABDOMEN - 1 VIEW COMPARISON:  Portable exam 1013 hours compared to CT abdomen and pelvis of 06/20/2018 FINDINGS: Scattered retained contrast in colonic diverticula and rectum. Nonobstructive bowel gas pattern. No bowel dilatation or bowel wall thickening. Bones demineralized with degenerative changes lumbar spine. IMPRESSION: Colonic diverticulosis. Electronically Signed   By: Lavonia Dana M.D.   On: 06/28/2020 10:25   DG Swallowing Func-Speech Pathology  Result Date: 06/23/2020 Objective Swallowing Evaluation: Type of Study: MBS-Modified Barium Swallow Study  Patient Details Name: JAYVIAN ESCOE MRN: [id number] Date of Birth: Nov 03, 1940 Today's Date: 06/23/2020 Time: SLP Start Time (ACUTE ONLY): 1417 -SLP Stop Time (ACUTE ONLY): 7829 SLP Time Calculation (min) (ACUTE ONLY): 20 min Past Medical History: Past Medical History: Diagnosis Date . Arthritis  . Atrial fibrillation ([place]) 01/31/2015 . Chronic gout  . Coronary artery disease 12/2007   STATUS POST STENTING OF THE LEFT ANTERIOR DESCENDING ARTERY . COVID-19  . Dyslipidemia  . Gout  . Hiatal hernia  . Hyperlipidemia  . Hypertension  . LBP (low back pain)  . Partial small bowel obstruction ([place])  . RBBB (right bundle branch block)  . SBO (small bowel obstruction) ([facility])   PARTIAL . Second degree AV block, Mobitz type I  . Urinary tract infection   Escherichia coli urinary tract infection Past Surgical History: Past Surgical History: Procedure Laterality Date . CARDIAC CATHETERIZATION   . HERNIA REPAIR    INGUINAL . ROTATOR CUFF REPAIR  HPI: 79 yo male presenting to ED with generalized weakness, poor food intake, and confusion. COVID positive. PMH including hiatal hernia, arthritis, A-fib, HTN, AKI, alcohol abuse, and CAD with s/p stenting of L anterior descending artery. CXR Progressive bibasilar airspace disease consistent with multifocal pneumonia.  No data recorded Assessment / Plan / Recommendation CHL IP CLINICAL IMPRESSIONS 06/23/2020 Clinical Impression Pt exhibits mild oropharyngeal dysphagia with aspiration of nectar consistency, and mild vallecular residual. 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 Pt unable to contain entire bolus in oral cavity and moderate amount nectar thick fell over tongue and was aspirated prior to initiation of laryngeal closure (frank volume) with immediate cough. Timing of closure delayed with thin using chin tuck and slight aspiraiton present. Lingual residue present falling into valleculae post swallow. Chin down posture using straw was effective with nectar however pt displays memory deficit and follow through of strategy is questionable. 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 Recommend honey thick liquids, Dys 3, crush meds. full supervision/assist. SLP will work with pt on chin tuck with nectar for possible upgrade.  SLP Visit  Diagnosis Dysphagia, oropharyngeal phase (R13.12) Attention and concentration deficit following -- Frontal lobe and executive function deficit following -- Impact on safety and function Mild aspiration risk;Moderate aspiration risk   CHL IP TREATMENT RECOMMENDATION 06/23/2020 Treatment Recommendations Therapy as outlined in treatment plan below   Prognosis 06/23/2020 Prognosis for Safe Diet Advancement (No Data) Barriers to Reach Goals Cognitive deficits Barriers/Prognosis Comment -- CHL IP DIET RECOMMENDATION 06/23/2020 SLP Diet Recommendations Dysphagia 3 (Mech soft) solids;Honey thick liquids Liquid Administration via Cup Medication Administration Crushed with puree Compensations Minimize environmental distractions;Slow rate;Small sips/bites Postural Changes Seated upright at 90 degrees;Remain semi-upright after after feeds/meals (Comment)   CHL IP OTHER RECOMMENDATIONS 06/23/2020 Recommended Consults -- Oral Care Recommendations Oral care BID Other Recommendations --   CHL IP FOLLOW UP RECOMMENDATIONS 06/23/2020 Follow up Recommendations (No Data)   CHL IP FREQUENCY AND DURATION 06/23/2020 Speech Therapy Frequency (ACUTE ONLY) min 2x/week Treatment Duration 2 weeks      CHL IP ORAL PHASE 06/23/2020 Oral Phase Impaired Oral - Pudding Teaspoon -- Oral - Pudding Cup -- Oral - Honey Teaspoon -- Oral - Honey Cup Lingual/palatal residue;Piecemeal swallowing Oral - Nectar Teaspoon -- Oral - Nectar Cup Decreased bolus cohesion Oral - Nectar Straw Decreased bolus cohesion Oral - Thin Teaspoon -- Oral - Thin Cup -- Oral - Thin Straw -- Oral - Puree Piecemeal swallowing Oral - Mech Soft -- Oral - Regular -- Oral - Multi-Consistency -- Oral - Pill -- Oral Phase - Comment --  CHL IP PHARYNGEAL PHASE 06/23/2020 Pharyngeal Phase Impaired Pharyngeal- Pudding Teaspoon -- Pharyngeal -- Pharyngeal- Pudding Cup -- Pharyngeal -- Pharyngeal- Honey Teaspoon -- Pharyngeal -- Pharyngeal- Honey Cup Pharyngeal residue - valleculae  Pharyngeal -- Pharyngeal- Nectar Teaspoon -- Pharyngeal -- Pharyngeal- Nectar Cup Penetration/Aspiration before swallow Pharyngeal Material enters airway, passes BELOW cords and not ejected out despite cough attempt by patient Pharyngeal- Nectar Straw Penetration/Aspiration during swallow Pharyngeal Material enters airway, remains ABOVE vocal cords then ejected out;Material does not enter airway Pharyngeal- Thin Teaspoon NT Pharyngeal -- Pharyngeal- Thin Cup NT Pharyngeal -- Pharyngeal- Thin Straw Penetration/Aspiration during swallow Pharyngeal Material enters airway, passes BELOW cords without attempt by patient to eject out (silent aspiration) Pharyngeal- Puree Pharyngeal residue - valleculae Pharyngeal -- Pharyngeal- Mechanical Soft -- Pharyngeal -- Pharyngeal- Regular WFL Pharyngeal -- Pharyngeal-  Multi-consistency -- Pharyngeal -- Pharyngeal- Pill -- Pharyngeal -- Pharyngeal Comment --  CHL IP CERVICAL ESOPHAGEAL PHASE 06/23/2020 Cervical Esophageal Phase WFL Pudding Teaspoon -- Pudding Cup -- Honey Teaspoon -- Honey Cup -- Nectar Teaspoon -- Nectar Cup -- Nectar Straw -- Thin Teaspoon -- Thin Cup -- Thin Straw -- Puree -- Mechanical Soft -- Regular -- Multi-consistency -- Pill -- Cervical Esophageal Comment -- [place] Siren 06/23/2020, 4:04 PM Orbie Pyo Colvin Caroli.Ed Actor Pager [phone] Office [phone]              ECHOCARDIOGRAM COMPLETE  Result Date: 06/23/2020    ECHOCARDIOGRAM REPORT   Patient Name:   HAZEM KENNER Date of Exam: 06/23/2020 Medical Rec #:  [id number]      Height:       71.0 in Accession #:    [id number]     Weight:       244.7 lb Date of Birth:  1941-07-14      BSA:          2.297 m Patient Age:    73 years       BP:           170/76 mmHg Patient Gender: M              HR:           71 bpm. Exam Location:  Inpatient Procedure: 2D Echo, Cardiac Doppler and Color Doppler Indications:    R07.9* Chest pain, unspecified. Elevated troponin  History:         Patient has prior history of Echocardiogram examinations, most                 recent 02/24/2019. CAD, Abnormal ECG, Arrythmias:RBBB,                 Signs/Symptoms:Hypotension; Risk Factors:Dyslipidemia. Covid 19                 positive. ETOH.  Sonographer:    Roseanna Rainbow RDCS Referring Phys: [place]  Sonographer Comments: Patient in Fowlaer's position. IMPRESSIONS  1. Left ventricular ejection fraction, by estimation, is 60 to 65%. The left ventricle has normal function. The left ventricle has no regional wall motion abnormalities. There is mild left ventricular hypertrophy. Left ventricular diastolic parameters are consistent with Grade I diastolic dysfunction (impaired relaxation).  2. Right ventricular systolic function is moderately reduced. The right ventricular size is normal. There is normal pulmonary artery systolic pressure. The estimated right ventricular systolic pressure is 46.5 mmHg. Abnormal RV, would consider PE.  3. Right atrial size was mildly dilated.  4. The mitral valve is normal in structure. No evidence of mitral valve regurgitation. No evidence of mitral stenosis.  5. The aortic valve is tricuspid. Aortic valve regurgitation is trivial. Mild aortic valve sclerosis is present, with no evidence of aortic valve stenosis.  6. The inferior vena cava is normal in size with greater than 50% respiratory variability, suggesting right atrial pressure of 3 mmHg. FINDINGS  Left Ventricle: Left ventricular ejection fraction, by estimation, is 60 to 65%. The left ventricle has normal function. The left ventricle has no regional wall motion abnormalities. The left ventricular internal cavity size was normal in size. There is  mild left ventricular hypertrophy. Left ventricular diastolic parameters are consistent with Grade I diastolic dysfunction (impaired relaxation). Right Ventricle: The right ventricular size is normal. No increase in right ventricular wall thickness. Right ventricular  systolic function is moderately reduced. There  is normal pulmonary artery systolic pressure. The tricuspid regurgitant velocity is 2.75 m/s, and with an assumed right atrial pressure of 3 mmHg, the estimated right ventricular systolic pressure is 85.6 mmHg. Left Atrium: Left atrial size was normal in size. Right Atrium: Right atrial size was mildly dilated. Pericardium: There is no evidence of pericardial effusion. Mitral Valve: The mitral valve is normal in structure. No evidence of mitral valve regurgitation. No evidence of mitral valve stenosis. Tricuspid Valve: The tricuspid valve is normal in structure. Tricuspid valve regurgitation is trivial. Aortic Valve: The aortic valve is tricuspid. Aortic valve regurgitation is trivial. Mild aortic valve sclerosis is present, with no evidence of aortic valve stenosis. Pulmonic Valve: The pulmonic valve was normal in structure. Pulmonic valve regurgitation is not visualized. Aorta: The aortic root is normal in size and structure. Venous: The inferior vena cava is normal in size with greater than 50% respiratory variability, suggesting right atrial pressure of 3 mmHg. IAS/Shunts: No atrial level shunt detected by color flow Doppler.  LEFT VENTRICLE PLAX 2D LVIDd:         3.80 cm     Diastology LVIDs:         2.60 cm     LV e' medial:    4.35 cm/s LV PW:         1.70 cm     LV E/e' medial:  16.9 LV IVS:        1.50 cm     LV e' lateral:   5.33 cm/s LVOT diam:     2.30 cm     LV E/e' lateral: 13.8 LV SV:         97 LV SV Index:   42 LVOT Area:     4.15 cm  LV Volumes (MOD) LV vol d, MOD A2C: 78.7 ml LV vol d, MOD A4C: 84.2 ml LV vol s, MOD A2C: 17.5 ml LV vol s, MOD A4C: 25.2 ml LV SV MOD A2C:     61.2 ml LV SV MOD A4C:     84.2 ml LV SV MOD BP:      64.3 ml RIGHT VENTRICLE             IVC RV S prime:     18.50 cm/s  IVC diam: 2.20 cm TAPSE (M-mode): 3.2 cm LEFT ATRIUM             Index       RIGHT ATRIUM           Index LA diam:        3.70 cm 1.61 cm/m  RA Area:      18.30 cm LA Vol (A2C):   34.1 ml 14.84 ml/m RA Volume:   53.10 ml  23.11 ml/m LA Vol (A4C):   49.7 ml 21.63 ml/m LA Biplane Vol: 43.7 ml 19.02 ml/m  AORTIC VALVE LVOT Vmax:   124.00 cm/s LVOT Vmean:  89.800 cm/s LVOT VTI:    0.233 m  AORTA Ao Root diam: 3.50 cm MITRAL VALVE               TRICUSPID VALVE MV Area (PHT): 3.21 cm    TR Peak grad:   30.2 mmHg MV Decel Time: 236 msec    TR Vmax:        275.00 cm/s MV E velocity: 73.30 cm/s MV A velocity: 99.05 cm/s  SHUNTS MV E/A ratio:  0.74        Systemic VTI:  0.23 m  Systemic Diam: 2.30 cm Loralie Champagne MD Electronically signed by Loralie Champagne MD Signature Date/Time: 06/23/2020/4:53:51 PM    Final    [place] EKG SITE RITE  Result Date: 06/29/2020 If Site Rite image not attached, placement could not be confirmed due to current cardiac rhythm.  [place] EKG SITE RITE  Result Date: 06/25/2020 If Site Rite image not attached, placement could not be confirmed due to current cardiac rhythm.   Micro Results    Recent Results (from the past 240 hour(s))  Culture, Urine     Status: None   Collection Time: 07/07/20  8:55 AM   Specimen: Urine, Random  Result Value Ref Range Status   Specimen Description URINE, RANDOM  Final   Special Requests NONE  Final   Culture   Final    NO GROWTH Performed at [facility], [address], [place], [place] [address]    Report Status 07/09/2020 FINAL  Final       Today   Subjective:   Gregory Wheeler with no significant events overnight as discussed with family and staff, his NGT was discontinued yesterday . Objective:   Blood pressure (!) 114/96, pulse 99, temperature 99.2 F (37.3 C), temperature source Axillary, resp. rate (!) 26, height '5\' 11"'  (1.803 m), weight 103.4 kg, SpO2 91 %.  No intake or output data in the 24 hours ending 07/13/20 1226  Exam  Patient is extremely frail, deconditioned, oriented x2, easily distracted Diminished air entry bilaterally, no  wheezing, rales or rhonchi RRR,No Gallops,Rubs or new Murmurs, No Parasternal Heave +ve B.Sounds, Abd Soft, Non tender No Cyanosis, Clubbing or edema Data Review   CBC w Diff:  Lab Results  Component Value Date   WBC 8.9 07/11/2020   HGB 8.3 (L) 07/11/2020   HGB 11.6 (L) 03/09/2019   HGB 13.8 11/04/2008   HCT 27.2 (L) 07/11/2020   HCT 35.8 (L) 03/09/2019   HCT 39.6 11/04/2008   PLT 323 07/11/2020   PLT 293 03/09/2019   LYMPHOPCT 14 07/11/2020   LYMPHOPCT 17.7 11/04/2008   MONOPCT 5 07/11/2020   MONOPCT 9.5 11/04/2008   EOSPCT 8 07/11/2020   EOSPCT 2.2 11/04/2008   BASOPCT 0 07/11/2020   BASOPCT 0.4 11/04/2008    CMP:  Lab Results  Component Value Date   NA 140 07/11/2020   NA 131 (L) 02/06/2019   K 3.9 07/11/2020   CL 105 07/11/2020   CO2 27 07/11/2020   BUN 9 07/11/2020   BUN 17 02/06/2019   CREATININE 0.84 07/11/2020   CREATININE 1.20 (H) 04/16/2016   PROT 6.0 (L) 07/11/2020   PROT 6.7 06/10/2017   ALBUMIN 2.3 (L) 07/11/2020   ALBUMIN 4.5 06/10/2017   BILITOT 0.4 07/11/2020   BILITOT 1.3 (H) 06/10/2017   ALKPHOS 66 07/11/2020   AST 28 07/11/2020   ALT 28 07/11/2020  .   Total Time in preparing paper work, data evaluation and todays exam - 55 minutes  Phillips Climes M.D on 07/13/2020 at 12:26 PM  [facility]   Office  [phone]

## 2020-07-13 NOTE — Progress Notes (Signed)
  Speech Language Pathology Treatment: Dysphagia  Patient Details Name: Gregory Wheeler MRN: [id number] DOB: 22-Feb-1941 Today's Date: 07/13/2020 Time: 1572-6203 SLP Time Calculation (min) (ACUTE ONLY): 26 min  Assessment / Plan / Recommendation Clinical Impression  Requested to see pt due to discharge home on comfort measure with family. Cognitive/behavior much improved from Monday when he was delirious. Following command, assisting in self feeding of puree, thin and nectar. Oral cavity pink, moist, void of dried secretions. Delayed cough once throughout trials. He was placed on honey thick liquids 10/28 during MBS when nectar thick spilled over tongue into larynx. Although no overt s/s aspiration with thin, recommend nectar thick and home health therapist can upgrade to thin when able. His partials did not fit when donned and he does not eat without them. Puree is recommended until partials attempted to place with cream. If partials fit , recommend upgrading to ground textures. Pt's son thoroughly educated re: thickener and po textures. He is being discharged home today with HH ST   HPI HPI: 79 yo male presenting to ED with generalized weakness, poor food intake, and confusion. COVID positive. PMH including hiatal hernia, arthritis, A-fib, HTN, AKI, alcohol abuse, and CAD with s/p stenting of L anterior descending artery. Intubated 11/3-11/9 due to respiratory failure in setting of worsening delirium and likely aspiration event. CXR with multifocal pneumonia with progression at the left base.  Pt was intubated from  06/29/20 - 07/05/20.        SLP Plan  Other (Comment);All goals met (discharge hospital today)       Recommendations  Diet recommendations: Dysphagia 1 (puree);Nectar-thick liquid Liquids provided via: Cup;No straw Medication Administration: Whole meds with puree Supervision: Patient able to self feed;Full supervision/cueing for compensatory strategies Compensations: Minimize  environmental distractions;Slow rate;Small sips/bites;Lingual sweep for clearance of pocketing Postural Changes and/or Swallow Maneuvers: Seated upright 90 degrees                Oral Care Recommendations: Oral care BID Follow up Recommendations: Home health SLP SLP Visit Diagnosis: Dysphagia, oropharyngeal phase (R13.12) Plan: Other (Comment);All goals met (discharge hospital today)       GO                [place] Siren 07/13/2020, 4:15 PM

## 2020-07-13 NOTE — Progress Notes (Signed)
Civil engineer, contracting [facility])  Hospital Liaison RN note         Notified by [facility] manager of patient/family request for [facility] Palliative services at home after discharge.         Writer spoke with patient's son who declined desire for outpatient palliative services at this time.  TOC made aware.   Thank you for the opportunity to participate in this patient's care.     Chrislyn Brooke Dare, BSN, RN [facility] Liaison (listed on AMION under Hosp[facility])    [phone]

## 2020-07-13 NOTE — Progress Notes (Signed)
Palliative:  HPI: 79 y.o. male  with past medical history of coronary artery disease, gout, heavy alcohol use, atrial fib, admitted on 06/22/2020 with mild Covid pneumonia, aspiration pneumonia, bacteremia with Enterococcus.  Admission has been complicated by delirium tremens and upper GI bleed resulting in intubation on November 3.  He was extubated on November 9.  He is extremely weak, unable to control copious secretions.  Appears to continue to be aspirating and NPO status.  Palliative medicine consulted for goals of care.  I met today at Gregory Wheeler's bedside along with children [name] and Romona. Gregory Wheeler is more alert and interactive. He attempts to speak but is difficult to understand and still confused. His secretion management seems improved (he has had some Robinul). He struggles to follow commands when I ask him to cough. Family at bedside are very hopeful that he will continue to improve. They are anxious to get him home as soon as possible. We discussed the importance of SLP and PT follow up for recommendations. I prompted them that he will likely need modified diet with pureed and thickened liquids and this can be liberalized based on his tolerance and follow up with SLP for recommendations. They understand. I helped to put him in recliner position in the bed and explained to them the importance of positioning especially with eating and drinking.   I agree that taking him home once optimized would be best for Gregory Wheeler. I do believe that he is high risk for further decline and complications. I spoke with family about outpatient palliative to follow at home for support and to help them with options if he does not continue on this path of improvement. They agree.   All questions/concerns addressed. Emotional support provided. Discussed with Dr. Waldron Labs.   Exam: Alert, confused. Verbal but unable to understand speech. No distress. Breathing regular, unlabored. Abd soft. Moves arms. Warm  to touch.   Plan: - Home with home health and outpatient palliative to follow.   25 min  Vinie Sill, NP Palliative Medicine Team Pager [phone] (Please see [email or web address] for schedule) Team Phone [phone]    Greater than 50%  of this time was spent counseling and coordinating care related to the above assessment and plan

## 2020-07-13 NOTE — TOC Transition Note (Addendum)
Transition of Care [facility]) - CM/SW Discharge Note   Patient Details  Name: Gregory Wheeler MRN: [id number] Date of Birth: [birth year]  [facility] CM/SW Contact:  Lawerance Sabal, RN Phone Number: 07/13/2020, 11:55 AM   Clinical Narrative:   Spoke with patient's son and daughter at bedside.  They want to DC to home today. They have arranged 24/7  family and private duty care, they have RW, WC, 3/1 at home.  We identified he will need bed w elevate head, they are planning to get bed w adjustable mattress and frame. Son states that they can get it delivered to the house today. They also have recliner that he would normally sleep in.   CM ordered suction, oxygen, and hoyer for home use. HH RN PT OT SLP through [facility]. Home IV Abx through [facility], who will attach his meds for home today prior to DC and do teaching with the family at bedside. Demographics verified. Will need PTAR for transport.   Spoke w son, Christen Bame who will call me when oxygen has been delivered to the house, so that PTAR can be called for hospital pickup.     Final next level of care: Home w Home Health Services Barriers to Discharge: No Barriers Identified   Patient Goals and CMS Choice Patient states their goals for this hospitalization and ongoing recovery are:: to go home CMS Medicare.gov Compare Post Acute Care list provided to:: Other (Comment Required) Choice offered to / list presented to : Adult Children  Discharge Placement                       Discharge Plan and Services   Discharge Planning Services: CM Consult Post Acute Care Choice: Home Health, Durable Medical Equipment          DME Arranged: Oxygen, Other see comment, Suction DME Agency: [employer] Date DME Agency Contacted: 07/13/20 Time DME Agency Contacted: 1152 Representative spoke with at DME Agency: Maud Deed HH Arranged: PT, OT, RN, Speech Therapy HH Agency: [facility] Date [facility] Agency Contacted: 07/13/20 Time  HH Agency Contacted: 1154 Representative spoke with at [facility] Agency: Kandee Keen  Social Determinants of Health (SDOH) Interventions     Readmission Risk Interventions No flowsheet data found.

## 2020-07-14 DIAGNOSIS — B9689 Other specified bacterial agents as the cause of diseases classified elsewhere: Secondary | ICD-10-CM

## 2020-07-19 NOTE — Progress Notes (Signed)
   07/11/20 2311  Assess: MEWS Score  Temp (!) 100.5 F (38.1 C)  BP 130/76  Pulse Rate (!) 114  ECG Heart Rate (!) 115  Resp (!) 25  Level of Consciousness Alert  SpO2 96 %  O2 Device Room Air  Patient Activity (if Appropriate) In bed  Assess: MEWS Score  MEWS Temp 1  MEWS Systolic 0  MEWS Pulse 2  MEWS RR 1  MEWS LOC 0  MEWS Score 4  MEWS Score Color Red  Assess: if the MEWS score is Yellow or Red  Were vital signs taken at a resting state? Yes  Focused Assessment No change from prior assessment  Early Detection of Sepsis Score *See Row Information* Low  MEWS guidelines implemented *See Row Information* Yes  Treat  MEWS Interventions Administered scheduled meds/treatments;Administered prn meds/treatments  Pain Scale 0-10  Pain Score 0  Take Vital Signs  Increase Vital Sign Frequency  Red: Q 1hr X 4 then Q 4hr X 4, if remains red, continue Q 4hrs  Escalate  MEWS: Escalate Red: discuss with charge nurse/RN and provider, consider discussing with RRT  Notify: Charge Nurse/RN  Name of Charge Nurse/RN Notified Anna, RN  Date Charge Nurse/RN Notified 07/11/20  Time Charge Nurse/RN Notified 2311  Notify: Provider  Provider Name/Title  (rechecked within an hour and MEWS went to yellow)  Document  Patient Outcome Not stable and remains on department  Progress note created (see row info) Yes     07/11/20 2311  Assess: MEWS Score  Temp (!) 100.5 F (38.1 C)  BP 130/76  Pulse Rate (!) 114  ECG Heart Rate (!) 115  Resp (!) 25  Level of Consciousness Alert  SpO2 96 %  O2 Device Room Air  Patient Activity (if Appropriate) In bed  Assess: MEWS Score  MEWS Temp 1  MEWS Systolic 0  MEWS Pulse 2  MEWS RR 1  MEWS LOC 0  MEWS Score 4  MEWS Score Color Red  Assess: if the MEWS score is Yellow or Red  Were vital signs taken at a resting state? Yes  Focused Assessment No change from prior assessment  Early Detection of Sepsis Score *See Row Information* Low  MEWS  guidelines implemented *See Row Information* Yes  Treat  MEWS Interventions Administered scheduled meds/treatments;Administered prn meds/treatments  Pain Scale 0-10  Pain Score 0  Take Vital Signs  Increase Vital Sign Frequency  Red: Q 1hr X 4 then Q 4hr X 4, if remains red, continue Q 4hrs  Escalate  MEWS: Escalate Red: discuss with charge nurse/RN and provider, consider discussing with RRT  Notify: Charge Nurse/RN  Name of Charge Nurse/RN Notified Anna, RN  Date Charge Nurse/RN Notified 07/11/20  Time Charge Nurse/RN Notified 2311  Notify: Provider  Provider Name/Title  (rechecked within an hour and MEWS went to yellow)  Document  Patient Outcome Not stable and remains on department  Progress note created (see row info) Yes

## 2020-07-28 ENCOUNTER — Telehealth: Payer: Self-pay

## 2020-07-28 ENCOUNTER — Encounter: Payer: Self-pay | Admitting: Internal Medicine

## 2020-07-28 ENCOUNTER — Ambulatory Visit (INDEPENDENT_AMBULATORY_CARE_PROVIDER_SITE_OTHER): Payer: Medicare Other | Admitting: Internal Medicine

## 2020-07-28 ENCOUNTER — Other Ambulatory Visit: Payer: Self-pay

## 2020-07-28 VITALS — BP 122/73 | HR 80 | Temp 97.4°F

## 2020-07-28 DIAGNOSIS — Z452 Encounter for adjustment and management of vascular access device: Secondary | ICD-10-CM | POA: Diagnosis not present

## 2020-07-28 DIAGNOSIS — R7881 Bacteremia: Secondary | ICD-10-CM

## 2020-07-28 DIAGNOSIS — B9689 Other specified bacterial agents as the cause of diseases classified elsewhere: Secondary | ICD-10-CM | POA: Diagnosis not present

## 2020-07-28 DIAGNOSIS — G9341 Metabolic encephalopathy: Secondary | ICD-10-CM

## 2020-07-28 DIAGNOSIS — I2510 Atherosclerotic heart disease of native coronary artery without angina pectoris: Secondary | ICD-10-CM

## 2020-07-28 NOTE — Progress Notes (Signed)
   Subjective:    Patient ID: THIERNO HUN, male    DOB: 1940/09/27, 79 y.o.   MRN: [id number]  HPI hsfu He came in with encephalopathy and found to have Enterococcal bacteremia.  TTE without vegetation but unable to do a TEE.  He also was COVID positive and was intubated.  He improved and repeat cultures remained negative.  He is being treated for 6 weeks for possible endocarditis with no TEE done and due to complete ampicillin and ceftriaxone next week on 12/9.  He feels well, much more close to his baseline, eating and no fever, rash or diarrhea.     Review of Systems  Constitutional: Negative for chills and fever.  Gastrointestinal: Negative for diarrhea and nausea.  Skin: Negative for rash.       Objective:   Physical Exam Eyes:     General: No scleral icterus. Pulmonary:     Effort: Pulmonary effort is normal.  Neurological:     Mental Status: He is alert.  Psychiatric:        Mood and Affect: Mood normal.   SH: no tobacco       Assessment & Plan:

## 2020-07-28 NOTE — Telephone Encounter (Signed)
Per MD called HH to pull picc after last dose. Spoke with Eunice Blase who will update nursing. Orders were repeated and confirmed before ending call.

## 2020-07-28 NOTE — Assessment & Plan Note (Signed)
This is working well, no issues and picc line can be pulled after the last dose on 12/9.

## 2020-07-28 NOTE — Assessment & Plan Note (Signed)
This resolved during the hospitalization and he continues to do well.

## 2020-07-28 NOTE — Assessment & Plan Note (Signed)
He continues to do well with no new issues or concerns.  He will complete his 6 weeks of treatment and then stop.  Discussed to call after that with new fever, chills or other concerns.   Otherwise he will rtc prn

## 2020-07-29 ENCOUNTER — Telehealth: Payer: Self-pay

## 2020-07-29 NOTE — Telephone Encounter (Signed)
Ok to stop now.  He was doing very well and no concerns.  Thanks

## 2020-07-29 NOTE — Telephone Encounter (Signed)
Notified [name] at [facility] to leave PICC out and discontinue medications. She will notify patient.   [name], RN

## 2020-07-29 NOTE — Telephone Encounter (Signed)
Received call from [facility] that patient pulled PICC out yesterday. Patient scheduled to be complete abx 12/8 and have PICC pulled after last dose. Patient receiving ampicillin continuously and rocephin Q12. Forwarding to provider for advice.  Lewellyn Fultz Loyola Mast, RN

## 2020-08-14 DIAGNOSIS — U071 COVID-19: Secondary | ICD-10-CM

## 2020-09-06 ENCOUNTER — Other Ambulatory Visit: Payer: Self-pay | Admitting: Cardiology

## 2020-09-13 NOTE — Progress Notes (Signed)
Gregory GlazierJack S Wheeler Date of Birth: 10-10-1940   History of Present Illness: [name] is seen for follow up of CAD and atrial fibrillation.  He has a history of coronary disease and is status post stenting of the LAD with overlapping Taxus stents in 2009. Myoview in April 2015 showed mild apical thinning, normal EF 67%.  He  was seen in the ED in February 2016 for symptoms of left arm numbness and pain. This was worse with lifting his arm. Ecg was done and demonstrated tachycardia with no clear P waves and rate 113.   He was seen in June 2016 symptoms of fatigue and acute gout flair in his right knee bursa. He was noted to be in atrial fibrillation with RVR (and review of ECG from February showed the same). He had been drinking Etoh heavily. He was started on metoprolol and Xarelto.   He was scheduled for a DCCV on 03/03/15 but on arrival he was in NSR with rate 77.   He was seen in the ED on 06/20/18 with abdominal pain and had an umbilical hernia reduced.   He was seen in June 2020  with symptoms of increased DOE and fatigue. Was noted to be anemic with Hgb 9. Low folate. Normal BNP. Echo was normal. Myoview was low risk without significant ischemia and unchanged from prior studies. He was started on iron therapy. GI evaluation was warranted but he did not pursue. Hgb did improve to 11.6. He is also B12 deficient and on shots.   He was admitted in November with acute encephalopathy. Had tested positive for Covid 19 10 days before. He had respiratory failure and required intubation. He had enterococcal bacteremia treated with 6 weeks of IV antibiotics. He had GI bleeding with EGD showing some gastritis. He had been drinking Etoh heavily and had DTs. He remained significantly deconditioned. Palliative care was consulted and he was a DNR. Echo showed normal EF. No significant valvular abnormality. Moderate RV dysfunction. He had third spacing of fluid with edema. Persistent dysphagia due to deconditioning.  He had urinary retention that later resolved. When seen in follow up with PCP he was persistently hypotensive and amlodipine was discontinued.   Since then he has really rallied. Seen with his son today. He is living by himself and able to do his ADLs. 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 Feels well. No chest pain, dyspnea, palpitations. No bleeding. Complains of left shoulder pain from old injury. Has significantly reduced his Etoh use.  Current Outpatient Medications on File Prior to Visit  Medication Sig Dispense Refill  . acetaminophen (TYLENOL) 325 MG tablet Take 650 mg by mouth 2 (two) times daily.    Marland Kitchen. allopurinol (ZYLOPRIM) 300 MG tablet Take 1 tablet (300 mg total) by mouth at bedtime. 30 tablet 0  . ALPRAZolam (XANAX) 0.25 MG tablet Take 1 tablet (0.25 mg total) by mouth 3 (three) times daily as needed for anxiety. 50 tablet 0  . ALPRAZolam (XANAX) 0.5 MG tablet Take 0.5-1 mg by mouth at bedtime.    Marland Kitchen. ascorbic acid (VITAMIN C) 1000 MG tablet Take 1,000 mg by mouth daily.    . famotidine (PEPCID) 40 MG tablet Take 40 mg by mouth daily as needed for heartburn or indigestion.    . folic acid (FOLVITE) 1 MG tablet Take 2 mg by mouth daily.     . rivaroxaban (XARELTO) 20 MG TABS tablet Take 1 tablet (20 mg total) by mouth daily with supper. 90  tablet 3  . metoprolol tartrate (LOPRESSOR) 25 MG tablet Take 1 tablet (25 mg total) by mouth 2 (two) times daily. 30 tablet 0  . pantoprazole (PROTONIX) 40 MG tablet Take 1 tablet (40 mg total) by mouth daily. 30 tablet 0   No current facility-administered medications on file prior to visit.    Allergies  Allergen Reactions  . Lorazepam Other (See Comments)    Extreme delirium     Past Medical History:  Diagnosis Date  . Arthritis   . Atrial fibrillation (HCC) 01/31/2015  . Chronic gout   . Coronary artery disease 12/2007   STATUS POST STENTING OF THE LEFT ANTERIOR DESCENDING ARTERY  . COVID-19   . Dyslipidemia   . Gout   . Hiatal  hernia   . Hyperlipidemia   . Hypertension   . LBP (low back pain)   . Partial small bowel obstruction (HCC)   . RBBB (right bundle branch block)   . SBO (small bowel obstruction) (HCC)    PARTIAL  . Second degree AV block, Mobitz type I   . Urinary tract infection    Escherichia coli urinary tract infection    Past Surgical History:  Procedure Laterality Date  . CARDIAC CATHETERIZATION    . ESOPHAGOGASTRODUODENOSCOPY N/A 06/30/2020   Procedure: ESOPHAGOGASTRODUODENOSCOPY (EGD);  Surgeon: Vida Rigger, MD;  Location: [facility] ENDOSCOPY;  Service: Endoscopy;  Laterality: N/A;  . HERNIA REPAIR     INGUINAL  . ROTATOR CUFF REPAIR      Social History   Tobacco Use  Smoking Status Former Smoker  . Quit date: 08/27/1990  . Years since quitting: 30.0  Smokeless Tobacco Never Used    Social History   Substance and Sexual Activity  Alcohol Use Yes   Comment: near daily    Family History  Problem Relation Age of Onset  . Emphysema Mother 73  . Heart attack Father 54    Review of Systems: As noted in HPI All other systems were reviewed and are negative.  Physical Exam: BP 115/62   Pulse 75   Ht 5\' 10"  (1.778 m)   Wt 217 lb 3.2 oz (98.5 kg)   SpO2 94%   BMI 31.16 kg/m  GENERAL:  Overweight WM in NAD HEENT:  PERRL, EOMI, sclera are clear. Oropharynx is clear. NECK:  No jugular venous distention, carotid upstroke brisk and symmetric, no bruits, no thyromegaly or adenopathy LUNGS:  Clear to auscultation bilaterally CHEST:  Unremarkable HEART:  RRR,  PMI not displaced or sustained,S1 and S2 within normal limits, no S3, no S4: no clicks, no rubs, no murmurs ABD:  Soft, nontender. BS +, no masses or bruits. No hepatomegaly, no splenomegaly EXT:  2 + pulses throughout, no edema, no cyanosis no clubbing SKIN:  Warm and dry.  No rashes NEURO:  Alert and oriented x 3. Cranial nerves II through XII intact. PSYCH:  Cognitively intact   LABORATORY DATA: Lab Results  Component  Value Date   WBC 8.9 07/11/2020   HGB 8.3 (L) 07/11/2020   HCT 27.2 (L) 07/11/2020   PLT 323 07/11/2020   GLUCOSE 101 (H) 07/11/2020   CHOL 176 06/10/2017   TRIG 100 07/05/2020   HDL 66 06/10/2017   LDLCALC 85 06/10/2017   ALT 28 07/11/2020   AST 28 07/11/2020   NA 140 07/11/2020   K 3.9 07/11/2020   CL 105 07/11/2020   CREATININE 0.84 07/11/2020   BUN 9 07/11/2020   CO2 27 07/11/2020   TSH 4.300  07/01/2018   INR 1.4 04/24/2016   HGBA1C 5.9 (H) 06/29/2020   Dated 12/30/18: cholesterol 129, triglycerides 97, HDL 47, LDL 85. Normal TSH. Dated 03/27/19: Hgb 11.3. ferritin 17. B12 175,  Dated 11/24/19: Hgb 9.8. albumin 3.2. otherwise CMET normal. B12 and folate normal. Iron 36 with sat 11%. Ferritin 15.   Ecg in October was in NSR  Echo: 02/09/15  Study Conclusions  - Left ventricle: The cavity size was normal. There was mild focal basal hypertrophy of the septum. Systolic function was normal. The estimated ejection fraction was in the range of 60% to 65%. Wall motion was normal; there were no regional wall motion abnormalities. - Aortic valve: Trileaflet; normal thickness, mildly calcified leaflets. - Mitral valve: Calcified annulus. - Pulmonic valve: There was trivial regurgitation.  Echo 02/17/19: IMPRESSIONS    1. The left ventricle has normal systolic function, with an ejection fraction of 55-60%. The cavity size was normal. There is mildly increased left ventricular wall thickness. Left ventricular diastolic Doppler parameters are consistent with impaired  relaxation. Indeterminate filling pressures The E/e' is 8-15. No evidence of left ventricular regional wall motion abnormalities.  2. The right ventricle has normal systolic function. The cavity was normal. There is no increase in right ventricular wall thickness.  3. The mitral valve is grossly normal.  4. The tricuspid valve is grossly normal.  5. The aortic valve is abnormal. Mild sclerosis of the aortic  valve. Aortic valve regurgitation is mild by color flow Doppler. No stenosis of the aortic valve.  6. 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  7. When compared to the prior study: 02/09/15 EF 60-65%.  Myoview 02/12/19: Study Highlights    The left ventricular ejection fraction is normal (55-65%).  Nuclear stress EF: 60%.  There was no ST segment deviation noted during stress.  Defect 1: There is a medium defect of moderate severity present in the basal inferolateral, mid inferolateral and apex location.  This is a low risk study.   Normal, low risk stress nuclear study with prominent inferior and apical thinning but no ischemia.  Gated ejection fraction 60% with normal wall motion.    Echo 06/23/20: IMPRESSIONS    1. Left ventricular ejection fraction, by estimation, is 60 to 65%. The  left ventricle has normal function. The left ventricle has no regional  wall motion abnormalities. There is mild left ventricular hypertrophy.  Left ventricular diastolic parameters  are consistent with Grade I diastolic dysfunction (impaired relaxation).  2. Right ventricular systolic function is moderately reduced. The right  ventricular size is normal. There is normal pulmonary artery systolic  pressure. The estimated right ventricular systolic pressure is 33.2 mmHg.  Abnormal RV, would consider PE.  3. Right atrial size was mildly dilated.  4. The mitral valve is normal in structure. No evidence of mitral valve  regurgitation. No evidence of mitral stenosis.  5. The aortic valve is tricuspid. Aortic valve regurgitation is trivial.  Mild aortic valve sclerosis is present, with no evidence of aortic valve  stenosis.  6. The inferior vena cava is normal in size with greater than 50%  respiratory variability, suggesting right atrial pressure of 3 mmHg.   Assessment / Plan: 1. Coronary disease status post stenting of the LAD in 2009 with Taxus stents. Nuclear  stress test in June 2020 was low risk and unchanged from prior. Echo normal. He has no significant angina.  Continue with risk factor modification. No Antiplatelet therapy now that  he is on Xarelto. Continue Toprol XL.   2. Atrial fibrillation- paroxysmal. In NSR today.    He is asymptomatic. Will continue Toprol.   [place] vasc score is 3.  Avoid NSAIDs. Now on Xarelto.   3. Hyperlipidemia   4. History of Mobitz type 1 second degree AV block. asymptomatic  5. HTN  Well controlled off amlodipine. Continue Toprol  6. Gout. Asymptomatic.  7. Etoh abuse. Recommend total abstinence. He is doing much better.   8. Obesity.   9. Peripheral neuropathy.   10. Anemia. With iron deficiency. Intolerant to oral iron. Follow up with primary care.   11. Covid 19 PNA/encephalopathy/ enterococcal bacteremia November 2021. Good recovery  I will follow up in 6 months.

## 2020-09-16 ENCOUNTER — Encounter: Payer: Self-pay | Admitting: Cardiology

## 2020-09-16 ENCOUNTER — Ambulatory Visit (INDEPENDENT_AMBULATORY_CARE_PROVIDER_SITE_OTHER): Payer: Medicare Other | Admitting: Cardiology

## 2020-09-16 ENCOUNTER — Other Ambulatory Visit: Payer: Self-pay

## 2020-09-16 VITALS — BP 115/62 | HR 75 | Ht 70.0 in | Wt 217.2 lb

## 2020-09-16 DIAGNOSIS — I480 Paroxysmal atrial fibrillation: Secondary | ICD-10-CM

## 2020-09-16 DIAGNOSIS — I2510 Atherosclerotic heart disease of native coronary artery without angina pectoris: Secondary | ICD-10-CM | POA: Diagnosis not present

## 2020-09-16 DIAGNOSIS — I10 Essential (primary) hypertension: Secondary | ICD-10-CM

## 2020-09-16 DIAGNOSIS — D509 Iron deficiency anemia, unspecified: Secondary | ICD-10-CM

## 2020-09-16 DIAGNOSIS — I440 Atrioventricular block, first degree: Secondary | ICD-10-CM

## 2020-09-23 ENCOUNTER — Ambulatory Visit: Payer: Medicare Other

## 2020-12-14 ENCOUNTER — Telehealth: Payer: Self-pay | Admitting: Cardiology

## 2020-12-14 DIAGNOSIS — I4819 Other persistent atrial fibrillation: Secondary | ICD-10-CM

## 2020-12-14 MED ORDER — RIVAROXABAN 20 MG PO TABS: 20.0000 mg | ORAL_TABLET | Freq: Every day | ORAL | 1 refills | Status: DC

## 2020-12-14 NOTE — Telephone Encounter (Signed)
Samples of Xarelto 20mg  drug were given to the patient, quantity 2 Bottles,  Lot Number Exp: 4/24  Patients daughter (Okay per [facility]) made aware that samples are available for pick up at the front desk. Patients daughter verbalized understanding.

## 2020-12-14 NOTE — Telephone Encounter (Signed)
*  STAT* If patient is at the pharmacy, call can be transferred to refill team.   1. Which medications need to be refilled? (please list name of each medication and dose if known)  rivaroxaban (XARELTO) 20 MG TABS tablet  2. Which pharmacy/location (including street and city if local pharmacy) is medication to be sent to?  [id number] - [place], [place] - [address]  3. Do they need a 30 day or 90 day supply?  30

## 2020-12-14 NOTE — Telephone Encounter (Signed)
Patient's daughter called and wants to see if we have any Xarelto samples since patient is currently out. I sent in a prescription for patient

## 2020-12-14 NOTE — Telephone Encounter (Signed)
70m, 98.5kg, ccr 99.3, lovw.[email or web address](09/16/20)  Creatinine, Ser (07/11/20) 0.61 - 1.24 mg/dL 6.23   Refill request received for xarelto 20mg  and was sent to [id number] - [place], [place] - [address]

## 2020-12-20 ENCOUNTER — Telehealth: Payer: Self-pay | Admitting: Cardiology

## 2020-12-20 NOTE — Telephone Encounter (Signed)
Returned call to patient's daughter concerning patient of Dr. [place]  She is concerned about cost of Xarelto [phone] for xarelto for 90 day supply She said it is over $500 for a 30 day supply  She picked up samples last week  She is asking what can be done to help with cost  Advised he could apply for patient assistance, contact insurance to determine if Eliquis is on lower tier and change med or try for tier exception   She would like a call from [place] to discuss

## 2020-12-20 NOTE — Telephone Encounter (Signed)
Want Dr. Elvis Coil nurse to call and discuss insurance

## 2020-12-20 NOTE — Telephone Encounter (Signed)
Spoke to patient's daughter Christella Hartigan she stated father cannot afford Xarelto.Advised I will leave a patient assistance form at front desk.Advised to complete and bring back to office along with proof of income.I will fax to [employer].

## 2020-12-23 ENCOUNTER — Telehealth: Payer: Self-pay

## 2020-12-23 NOTE — Telephone Encounter (Signed)
   [facility] Pre-operative Risk Assessment    Patient Name: Gregory Wheeler  DOB: [birth year]  MRN: [id number]     Request for surgical clearance:  1. What type of surgery is being performed TFESI  2. When is this surgery scheduled TBD  3. What type of clearance is required (medical clearance vs. Pharmacy clearance to hold med vs. Both)? Pharmacy  4. Are there any medications that need to be held prior to surgery and how long  Xarelto hold 4 days before  5. Practice name and name of physician performing surgery [facility]   Dr.Anuj Shah  6. What is the office phone number [phone]   7.   What is the office fax number [phone]  8.   Anesthesia type not listed   Neoma Laming 12/23/2020, 2:49 PM  _________________________________________________________________   (provider comments below)

## 2020-12-23 NOTE — Telephone Encounter (Signed)
Unable to leave message.  Tried to contact patient on 12/23/2020 at 3:52 PM.  Patient is call back.

## 2020-12-23 NOTE — Telephone Encounter (Signed)
Patient with diagnosis of atrial fibrillation on Xarelto for anticoagulation.    Procedure: TFESI Date of procedure: TBD   CHA2DS2-VASc Score = 4  This indicates a 4.8% annual risk of stroke. The patient's score is based upon: CHF History: No HTN History: Yes Diabetes History: No Stroke History: No Vascular Disease History: Yes Age Score: 2 Gender Score: 0   CrCl 99.4 Platelet count 323  Per office protocol, patient can hold Xarelto for 3 days prior to procedure.    Patient will not need bridging with Lovenox (enoxaparin) around procedure.

## 2020-12-26 NOTE — Telephone Encounter (Signed)
Holding Instruction has been given to the son who manages the patient's health care.

## 2021-02-06 ENCOUNTER — Telehealth: Payer: Self-pay | Admitting: Cardiology

## 2021-02-06 NOTE — Telephone Encounter (Signed)
Patient calling the office for samples of medication:   1.  What medication and dosage are you requesting samples for?    rivaroxaban (XARELTO) 20 MG TABS tablet   2.  Are you currently out of this medication?  Yes, patient's daughter states the patient is completely out of medication.

## 2021-02-06 NOTE — Telephone Encounter (Signed)
Called daughter and informed samples are placed up front for pick up.  Xarelto 20 mg  QTY; 2 bottles Lot # [id number] Exp: 4/24

## 2021-02-20 ENCOUNTER — Telehealth: Payer: Self-pay | Admitting: Cardiology

## 2021-02-20 NOTE — Telephone Encounter (Signed)
Returned call to daughter-advised no samples currently.  Patient has 2 pills and has current prescription.  They just request samples due to high deductible.         Daughter verbalized understanding.

## 2021-02-20 NOTE — Telephone Encounter (Signed)
Patient calling the office for samples of medication:   1.  What medication and dosage are you requesting samples for?    rivaroxaban (XARELTO) 20 MG TABS tablet   2.  Are you currently out of this medication?  Yes, patient's daughter states the patient is completely out of medication.  

## 2021-02-24 NOTE — Telephone Encounter (Signed)
Spoke to patient's daughter Christella Hartigan Xarelto 20 mg samples left at front desk.

## 2021-03-20 NOTE — Progress Notes (Deleted)
Gregory GlazierJack S Wheeler Date of Birth: 10-23-1940   History of Present Illness: Gregory Wheeler is seen for follow up of CAD and atrial fibrillation.  He has a history of coronary disease and is status post stenting of the LAD with overlapping Taxus stents in 2009. Myoview in April 2015 showed mild apical thinning, normal EF 67%.  He  was seen in the ED in February 2016 for symptoms of left arm numbness and pain. This was worse with lifting his arm. Ecg was done and demonstrated tachycardia with no clear P waves and rate 113.   He was seen in June 2016 symptoms of fatigue and acute gout flair in his right knee bursa. He was noted to be in atrial fibrillation with RVR (and review of ECG from February showed the same). He had been drinking Etoh heavily. He was started on metoprolol and Xarelto.   He was scheduled for a DCCV on 03/03/15 but on arrival he was in NSR with rate 77.   He was seen in the ED on 06/20/18 with abdominal pain and had an umbilical hernia reduced.   He was seen in June 2020  with symptoms of increased DOE and fatigue. Was noted to be anemic with Hgb 9. Low folate. Normal BNP. Echo was normal. Myoview was low risk without significant ischemia and unchanged from prior studies. He was started on iron therapy. GI evaluation was warranted but he did not pursue. Hgb did improve to 11.6. He is also B12 deficient and on shots.   He was admitted in November with acute encephalopathy. Had tested positive for Covid 19 10 days before. He had respiratory failure and required intubation. He had enterococcal bacteremia treated with 6 weeks of IV antibiotics. He had GI bleeding with EGD showing some gastritis. He had been drinking Etoh heavily and had DTs. He remained significantly deconditioned. Palliative care was consulted and he was a DNR. Echo showed normal EF. No significant valvular abnormality. Moderate RV dysfunction. He had third spacing of fluid with edema. Persistent dysphagia due to deconditioning.  He had urinary retention that later resolved. When seen in follow up with PCP he was persistently hypotensive and amlodipine was discontinued.   Since then he has really rallied. Seen with his son today. He is living by himself and able to do his ADLs. 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 Feels well. No chest pain, dyspnea, palpitations. No bleeding. Complains of left shoulder pain from old injury. Has significantly reduced his Etoh use.  Current Outpatient Medications on File Prior to Visit  Medication Sig Dispense Refill   acetaminophen (TYLENOL) 325 MG tablet Take 650 mg by mouth 2 (two) times daily.     allopurinol (ZYLOPRIM) 300 MG tablet Take 1 tablet (300 mg total) by mouth at bedtime. 30 tablet 0   ALPRAZolam (XANAX) 0.25 MG tablet Take 1 tablet (0.25 mg total) by mouth 3 (three) times daily as needed for anxiety. 50 tablet 0   ALPRAZolam (XANAX) 0.5 MG tablet Take 0.5-1 mg by mouth at bedtime.     ascorbic acid (VITAMIN C) 1000 MG tablet Take 1,000 mg by mouth daily.     famotidine (PEPCID) 40 MG tablet Take 40 mg by mouth daily as needed for heartburn or indigestion.     folic acid (FOLVITE) 1 MG tablet Take 2 mg by mouth daily.      metoprolol tartrate (LOPRESSOR) 25 MG tablet Take 1 tablet (25 mg total) by mouth 2 (two) times daily.  30 tablet 0   pantoprazole (PROTONIX) 40 MG tablet Take 1 tablet (40 mg total) by mouth daily. 30 tablet 0   rivaroxaban (XARELTO) 20 MG TABS tablet Take 1 tablet (20 mg total) by mouth daily with supper. 90 tablet 1   No current facility-administered medications on file prior to visit.    Allergies  Allergen Reactions   Lorazepam Other (See Comments)    Extreme delirium     Past Medical History:  Diagnosis Date   Arthritis    Atrial fibrillation (HCC) 01/31/2015   Chronic gout    Coronary artery disease 12/2007   STATUS POST STENTING OF THE LEFT ANTERIOR DESCENDING ARTERY   COVID-19    Dyslipidemia    Gout    Hiatal hernia     Hyperlipidemia    Hypertension    LBP (low back pain)    Partial small bowel obstruction (HCC)    RBBB (right bundle branch block)    SBO (small bowel obstruction) (HCC)    PARTIAL   Second degree AV block, Mobitz type I    Urinary tract infection    Escherichia coli urinary tract infection    Past Surgical History:  Procedure Laterality Date   CARDIAC CATHETERIZATION     ESOPHAGOGASTRODUODENOSCOPY N/A 06/30/2020   Procedure: ESOPHAGOGASTRODUODENOSCOPY (EGD);  Surgeon: Gregory Rigger, MD;  Location: [facility] ENDOSCOPY;  Service: Endoscopy;  Laterality: N/A;   HERNIA REPAIR     INGUINAL   ROTATOR CUFF REPAIR      Social History   Tobacco Use  Smoking Status Former   Types: Cigarettes   Quit date: 08/27/1990   Years since quitting: 30.5  Smokeless Tobacco Never    Social History   Substance and Sexual Activity  Alcohol Use Yes   Comment: near daily    Family History  Problem Relation Age of Onset   Emphysema Mother 72   Heart attack Father 46    Review of Systems: As noted in HPI All other systems were reviewed and are negative.  Physical Exam: There were no vitals taken for this visit. GENERAL:  Overweight WM in NAD HEENT:  PERRL, EOMI, sclera are clear. Oropharynx is clear. NECK:  No jugular venous distention, carotid upstroke brisk and symmetric, no bruits, no thyromegaly or adenopathy LUNGS:  Clear to auscultation bilaterally CHEST:  Unremarkable HEART:  RRR,  PMI not displaced or sustained,S1 and S2 within normal limits, no S3, no S4: no clicks, no rubs, no murmurs ABD:  Soft, nontender. BS +, no masses or bruits. No hepatomegaly, no splenomegaly EXT:  2 + pulses throughout, no edema, no cyanosis no clubbing SKIN:  Warm and dry.  No rashes NEURO:  Alert and oriented x 3. Cranial nerves II through XII intact. PSYCH:  Cognitively intact   LABORATORY DATA: Lab Results  Component Value Date   WBC 8.9 07/11/2020   HGB 8.3 (L) 07/11/2020   HCT 27.2 (L) 07/11/2020    PLT 323 07/11/2020   GLUCOSE 101 (H) 07/11/2020   CHOL 176 06/10/2017   TRIG 100 07/05/2020   HDL 66 06/10/2017   LDLCALC 85 06/10/2017   ALT 28 07/11/2020   AST 28 07/11/2020   NA 140 07/11/2020   K 3.9 07/11/2020   CL 105 07/11/2020   CREATININE 0.84 07/11/2020   BUN 9 07/11/2020   CO2 27 07/11/2020   TSH 4.300 07/01/2018   INR 1.4 04/24/2016   HGBA1C 5.9 (H) 06/29/2020   Dated 12/30/18: cholesterol 129, triglycerides 97, HDL 47,  LDL 85. Normal TSH. Dated 03/27/19: Hgb 11.3. ferritin 17. B12 175,  Dated 11/24/19: Hgb 9.8. albumin 3.2. otherwise CMET normal. B12 and folate normal. Iron 36 with sat 11%. Ferritin 15.   Ecg in October was in NSR  Echo: 02/09/15  Study Conclusions  - Left ventricle: The cavity size was normal. There was mild focal   basal hypertrophy of the septum. Systolic function was normal.   The estimated ejection fraction was in the range of 60% to 65%.   Wall motion was normal; there were no regional wall motion   abnormalities. - Aortic valve: Trileaflet; normal thickness, mildly calcified   leaflets. - Mitral valve: Calcified annulus. - Pulmonic valve: There was trivial regurgitation.  Echo 02/17/19: IMPRESSIONS      1. The left ventricle has normal systolic function, with an ejection fraction of 55-60%. The cavity size was normal. There is mildly increased left ventricular wall thickness. Left ventricular diastolic Doppler parameters are consistent with impaired  relaxation. Indeterminate filling pressures The E/e' is 8-15. No evidence of left ventricular regional wall motion abnormalities.  2. The right ventricle has normal systolic function. The cavity was normal. There is no increase in right ventricular wall thickness.  3. The mitral valve is grossly normal.  4. The tricuspid valve is grossly normal.  5. The aortic valve is abnormal. Mild sclerosis of the aortic valve. Aortic valve regurgitation is mild by color flow Doppler. No stenosis of the  aortic valve.  6. 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  7. When compared to the prior study: 02/09/15 EF 60-65%.  Myoview 02/12/19: Study Highlights    The left ventricular ejection fraction is normal (55-65%). Nuclear stress EF: 60%. There was no ST segment deviation noted during stress. Defect 1: There is a medium defect of moderate severity present in the basal inferolateral, mid inferolateral and apex location. This is a low risk study.   Normal, low risk stress nuclear study with prominent inferior and apical thinning but no ischemia.  Gated ejection fraction 60% with normal wall motion.    Echo 06/23/20: IMPRESSIONS     1. Left ventricular ejection fraction, by estimation, is 60 to 65%. The  left ventricle has normal function. The left ventricle has no regional  wall motion abnormalities. There is mild left ventricular hypertrophy.  Left ventricular diastolic parameters  are consistent with Grade I diastolic dysfunction (impaired relaxation).   2. Right ventricular systolic function is moderately reduced. The right  ventricular size is normal. There is normal pulmonary artery systolic  pressure. The estimated right ventricular systolic pressure is 33.2 mmHg.  Abnormal RV, would consider PE.   3. Right atrial size was mildly dilated.   4. The mitral valve is normal in structure. No evidence of mitral valve  regurgitation. No evidence of mitral stenosis.   5. The aortic valve is tricuspid. Aortic valve regurgitation is trivial.  Mild aortic valve sclerosis is present, with no evidence of aortic valve  stenosis.   6. The inferior vena cava is normal in size with greater than 50%  respiratory variability, suggesting right atrial pressure of 3 mmHg.   Assessment / Plan: 1. Coronary disease status post stenting of the LAD in 2009 with Taxus stents. Nuclear stress test in June 2020 was low risk and unchanged from prior. Echo normal. He has no significant  angina.  Continue with risk factor modification. No Antiplatelet therapy now that he is on Xarelto. Continue Toprol XL.  2. Atrial fibrillation- paroxysmal. In NSR today.    He is asymptomatic. Will continue Toprol.   [place] vasc score is 3.  Avoid NSAIDs. Now on Xarelto.   3. Hyperlipidemia   4. History of Mobitz type 1 second degree AV block. asymptomatic  5. HTN  Well controlled off amlodipine. Continue Toprol  6. Gout. Asymptomatic.  7. Etoh abuse. Recommend total abstinence. He is doing much better.   8. Obesity.   9. Peripheral neuropathy.   10. Anemia. With iron deficiency. Intolerant to oral iron. Follow up with primary care.   11. Covid 19 PNA/encephalopathy/ enterococcal bacteremia November 2021. Good recovery  I will follow up in 6 months.

## 2021-03-22 ENCOUNTER — Ambulatory Visit: Payer: Medicare Other | Admitting: Cardiology

## 2021-03-23 ENCOUNTER — Other Ambulatory Visit: Payer: Self-pay

## 2021-03-23 DIAGNOSIS — I4819 Other persistent atrial fibrillation: Secondary | ICD-10-CM

## 2021-03-23 MED ORDER — RIVAROXABAN 20 MG PO TABS: 20.0000 mg | ORAL_TABLET | Freq: Every day | ORAL | 3 refills | Status: DC

## 2021-03-23 MED ORDER — METOPROLOL TARTRATE 25 MG PO TABS: 25.0000 mg | ORAL_TABLET | Freq: Two times a day (BID) | ORAL | 3 refills | Status: DC

## 2021-03-23 MED ORDER — ALLOPURINOL 300 MG PO TABS
300.0000 mg | ORAL_TABLET | Freq: Every day | ORAL | 3 refills | Status: DC
Start: 2021-03-23 — End: 2023-09-03

## 2021-03-24 ENCOUNTER — Other Ambulatory Visit: Payer: Self-pay

## 2021-03-24 DIAGNOSIS — I4819 Other persistent atrial fibrillation: Secondary | ICD-10-CM

## 2021-03-24 MED ORDER — RIVAROXABAN 20 MG PO TABS: 20.0000 mg | ORAL_TABLET | Freq: Every day | ORAL | 3 refills | Status: DC

## 2021-04-17 ENCOUNTER — Telehealth: Payer: Self-pay | Admitting: Cardiology

## 2021-04-17 NOTE — Telephone Encounter (Signed)
Patient calling the office for samples of medication:   1.  What medication and dosage are you requesting samples for? rivaroxaban (XARELTO) 20 MG TABS tablet  2.  Are you currently out of this medication? yes   Patient's daughter also states the patient has fluid around his heart and he is being given fluid pills. She would like to discuss this with Elnita Maxwell and says a message was already sent to Dr. [place].

## 2021-04-17 NOTE — Telephone Encounter (Signed)
Spoke to patient's daughter [name] Xarelto 20 mg samples left at front desk. 

## 2021-04-21 NOTE — Progress Notes (Signed)
Gregory Wheeler Date of Birth: [birth year]   History of Present Illness: Mr. Burnett ShengHedrick is seen for follow up of CAD and atrial fibrillation.  He has a history of coronary disease and is status post stenting of the LAD with overlapping Taxus stents in 2009. Myoview in April 2015 showed mild apical thinning, normal EF 67%.  He  was seen in the ED in February 2016 for symptoms of left arm numbness and pain. This was worse with lifting his arm. Ecg was done and demonstrated tachycardia with no clear P waves and rate 113.   He was seen in June 2016 symptoms of fatigue and acute gout flair in his right knee bursa. He was noted to be in atrial fibrillation with RVR (and review of ECG from February showed the same). He had been drinking Etoh heavily. He was started on metoprolol and Xarelto.   He was scheduled for a DCCV on 03/03/15 but on arrival he was in NSR with rate 77.   He was seen in the ED on 06/20/18 with abdominal pain and had an umbilical hernia reduced.   He was seen in June 2020  with symptoms of increased DOE and fatigue. Was noted to be anemic with Hgb 9. Low folate. Normal BNP. Echo was normal. Myoview was low risk without significant ischemia and unchanged from prior studies. He was started on iron therapy. GI evaluation was warranted but he did not pursue. Hgb did improve to 11.6. He is also B12 deficient and on shots.   He was admitted in November with acute encephalopathy. Had tested positive for Covid 19 10 days before. He had respiratory failure and required intubation. He had enterococcal bacteremia treated with 6 weeks of IV antibiotics. He had GI bleeding with EGD showing some gastritis. He had been drinking Etoh heavily and had DTs. He remained significantly deconditioned. Palliative care was consulted and he was a DNR. Echo showed normal EF. No significant valvular abnormality. Moderate RV dysfunction. He had third spacing of fluid with edema. Persistent dysphagia due to deconditioning.  He had urinary retention that later resolved. When seen in follow up with PCP he was persistently hypotensive and amlodipine was discontinued.   Seen with his son today. He is living by himself and able to do his ADLs. One son lives with him. He is drinking Etoh again. Complains of cough for 2 weeks with pain in left ribs. Was given a fluid pill last week for swelling which has resolved. No fever. No angina. No palpitations. Complains of a lot of shoulder and hip pain which limits his activity.   Current Outpatient Medications on File Prior to Visit  Medication Sig Dispense Refill   acetaminophen (TYLENOL) 325 MG tablet Take 650 mg by mouth 2 (two) times daily.     allopurinol (ZYLOPRIM) 300 MG tablet Take 1 tablet (300 mg total) by mouth at bedtime. 90 tablet 3   ALPRAZolam (XANAX) 0.25 MG tablet Take 1 tablet (0.25 mg total) by mouth 3 (three) times daily as needed for anxiety. 50 tablet 0   ALPRAZolam (XANAX) 0.5 MG tablet Take 0.5-1 mg by mouth at bedtime.     ascorbic acid (VITAMIN C) 1000 MG tablet Take 1,000 mg by mouth daily.     famotidine (PEPCID) 40 MG tablet Take 40 mg by mouth daily as needed for heartburn or indigestion.     folic acid (FOLVITE) 1 MG tablet Take 2 mg by mouth daily.      rivaroxaban (XARELTO)  20 MG TABS tablet Take 1 tablet (20 mg total) by mouth daily with supper. 90 tablet 3   metoprolol tartrate (LOPRESSOR) 25 MG tablet Take 1 tablet (25 mg total) by mouth 2 (two) times daily. 180 tablet 3   pantoprazole (PROTONIX) 40 MG tablet Take 1 tablet (40 mg total) by mouth daily. 30 tablet 0   No current facility-administered medications on file prior to visit.    Allergies  Allergen Reactions   Lorazepam Other (See Comments)    Extreme delirium     Past Medical History:  Diagnosis Date   Arthritis    Atrial fibrillation (HCC) 01/31/2015   Chronic gout    Coronary artery disease 12/2007   STATUS POST STENTING OF THE LEFT ANTERIOR DESCENDING ARTERY   COVID-19     Dyslipidemia    Gout    Hiatal hernia    Hyperlipidemia    Hypertension    LBP (low back pain)    Partial small bowel obstruction (HCC)    RBBB (right bundle branch block)    SBO (small bowel obstruction) (HCC)    PARTIAL   Second degree AV block, Mobitz type I    Urinary tract infection    Escherichia coli urinary tract infection    Past Surgical History:  Procedure Laterality Date   CARDIAC CATHETERIZATION     ESOPHAGOGASTRODUODENOSCOPY N/A 06/30/2020   Procedure: ESOPHAGOGASTRODUODENOSCOPY (EGD);  Surgeon: Vida Rigger, MD;  Location: [facility] ENDOSCOPY;  Service: Endoscopy;  Laterality: N/A;   HERNIA REPAIR     INGUINAL   ROTATOR CUFF REPAIR      Social History   Tobacco Use  Smoking Status Former   Types: Cigarettes   Quit date: 08/27/1990   Years since quitting: 30.6  Smokeless Tobacco Never    Social History   Substance and Sexual Activity  Alcohol Use Yes   Comment: near daily    Family History  Problem Relation Age of Onset   Emphysema Mother 62   Heart attack Father 73    Review of Systems: As noted in HPI All other systems were reviewed and are negative.  Physical Exam: BP (!) 150/73   Pulse 61   Ht 5\' 10"  (1.778 m)   Wt 233 lb 6.4 oz (105.9 kg)   SpO2 (!) 82%   BMI 33.49 kg/m  GENERAL:  Overweight WM in NAD HEENT:  PERRL, EOMI, sclera are clear. Oropharynx is clear. NECK:  No jugular venous distention, carotid upstroke brisk and symmetric, no bruits, no thyromegaly or adenopathy LUNGS:  Clear to auscultation bilaterally CHEST:  Unremarkable HEART:  RRR,  PMI not displaced or sustained,S1 and S2 within normal limits, no S3, no S4: no clicks, no rubs, no murmurs ABD:  Soft, nontender. BS +, no masses or bruits. No hepatomegaly, no splenomegaly EXT:  2 + pulses throughout, no edema, no cyanosis no clubbing SKIN:  Warm and dry.  No rashes NEURO:  Alert and oriented x 3. Cranial nerves II through XII intact. PSYCH:  Cognitively  intact   LABORATORY DATA: Lab Results  Component Value Date   WBC 8.9 07/11/2020   HGB 8.3 (L) 07/11/2020   HCT 27.2 (L) 07/11/2020   PLT 323 07/11/2020   GLUCOSE 101 (H) 07/11/2020   CHOL 176 06/10/2017   TRIG 100 07/05/2020   HDL 66 06/10/2017   LDLCALC 85 06/10/2017   ALT 28 07/11/2020   AST 28 07/11/2020   NA 140 07/11/2020   K 3.9 07/11/2020   CL 105  07/11/2020   CREATININE 0.84 07/11/2020   BUN 9 07/11/2020   CO2 27 07/11/2020   TSH 4.300 07/01/2018   INR 1.4 04/24/2016   HGBA1C 5.9 (H) 06/29/2020   Dated 12/30/18: cholesterol 129, triglycerides 97, HDL 47, LDL 85. Normal TSH. Dated 03/27/19: Hgb 11.3. ferritin 17. B12 175,  Dated 11/24/19: Hgb 9.8. albumin 3.2. otherwise CMET normal. B12 and folate normal. Iron 36 with sat 11%. Ferritin 15.   Ecg today shows NSR rate 61. RBBB. LAD. I have personally reviewed and interpreted this study.   Echo: 02/09/15  Study Conclusions  - Left ventricle: The cavity size was normal. There was mild focal   basal hypertrophy of the septum. Systolic function was normal.   The estimated ejection fraction was in the range of 60% to 65%.   Wall motion was normal; there were no regional wall motion   abnormalities. - Aortic valve: Trileaflet; normal thickness, mildly calcified   leaflets. - Mitral valve: Calcified annulus. - Pulmonic valve: There was trivial regurgitation.  Echo 02/17/19: IMPRESSIONS      1. The left ventricle has normal systolic function, with an ejection fraction of 55-60%. The cavity size was normal. There is mildly increased left ventricular wall thickness. Left ventricular diastolic Doppler parameters are consistent with impaired  relaxation. Indeterminate filling pressures The E/e' is 8-15. No evidence of left ventricular regional wall motion abnormalities.  2. The right ventricle has normal systolic function. The cavity was normal. There is no increase in right ventricular wall thickness.  3. The mitral valve is  grossly normal.  4. The tricuspid valve is grossly normal.  5. The aortic valve is abnormal. Mild sclerosis of the aortic valve. Aortic valve regurgitation is mild by color flow Doppler. No stenosis of the aortic valve.  6. 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  7. When compared to the prior study: 02/09/15 EF 60-65%.  Myoview 02/12/19: Study Highlights    The left ventricular ejection fraction is normal (55-65%). Nuclear stress EF: 60%. There was no ST segment deviation noted during stress. Defect 1: There is a medium defect of moderate severity present in the basal inferolateral, mid inferolateral and apex location. This is a low risk study.   Normal, low risk stress nuclear study with prominent inferior and apical thinning but no ischemia.  Gated ejection fraction 60% with normal wall motion.    Echo 06/23/20: IMPRESSIONS     1. Left ventricular ejection fraction, by estimation, is 60 to 65%. The  left ventricle has normal function. The left ventricle has no regional  wall motion abnormalities. There is mild left ventricular hypertrophy.  Left ventricular diastolic parameters  are consistent with Grade I diastolic dysfunction (impaired relaxation).   2. Right ventricular systolic function is moderately reduced. The right  ventricular size is normal. There is normal pulmonary artery systolic  pressure. The estimated right ventricular systolic pressure is 33.2 mmHg.  Abnormal RV, would consider PE.   3. Right atrial size was mildly dilated.   4. The mitral valve is normal in structure. No evidence of mitral valve  regurgitation. No evidence of mitral stenosis.   5. The aortic valve is tricuspid. Aortic valve regurgitation is trivial.  Mild aortic valve sclerosis is present, with no evidence of aortic valve  stenosis.   6. The inferior vena cava is normal in size with greater than 50%  respiratory variability, suggesting right atrial pressure of 3 mmHg.    Assessment / Plan:  1. Coronary disease status post stenting of the LAD in 2009 with Taxus stents. Nuclear stress test in June 2020 was low risk and unchanged from prior. Echo normal. He has no significant angina.  Continue with risk factor modification. No Antiplatelet therapy now that he is on Xarelto. Continue Toprol XL.   2. Atrial fibrillation- paroxysmal. In NSR today.    He is asymptomatic. Will continue Toprol.   [place] vasc score is 3.  Avoid NSAIDs. Now on Xarelto.   3. Hyperlipidemia   4. History of Mobitz type 1 second degree AV block. asymptomatic  5. HTN  under fair control off amlodipine. Continue Toprol  6. Gout. Asymptomatic.  7. Etoh abuse. Recommend total abstinence. Unfortunately he is drinking again.  8. Obesity.   9. Peripheral neuropathy.   10. Anemia. With iron deficiency. Intolerant to oral iron. Follow up with primary care.   11. Covid 19 PNA/encephalopathy/ enterococcal bacteremia November 2021. Good recovery  I will follow up in 6 months.

## 2021-04-24 ENCOUNTER — Other Ambulatory Visit: Payer: Self-pay

## 2021-04-24 ENCOUNTER — Encounter: Payer: Self-pay | Admitting: Cardiology

## 2021-04-24 ENCOUNTER — Ambulatory Visit (INDEPENDENT_AMBULATORY_CARE_PROVIDER_SITE_OTHER): Payer: Medicare Other | Admitting: Cardiology

## 2021-04-24 VITALS — BP 150/73 | HR 61 | Ht 70.0 in | Wt 233.4 lb

## 2021-04-24 DIAGNOSIS — I10 Essential (primary) hypertension: Secondary | ICD-10-CM | POA: Diagnosis not present

## 2021-04-24 DIAGNOSIS — I4819 Other persistent atrial fibrillation: Secondary | ICD-10-CM | POA: Diagnosis not present

## 2021-04-24 DIAGNOSIS — I2510 Atherosclerotic heart disease of native coronary artery without angina pectoris: Secondary | ICD-10-CM

## 2021-04-24 DIAGNOSIS — R5383 Other fatigue: Secondary | ICD-10-CM | POA: Diagnosis not present

## 2021-04-24 MED ORDER — METOPROLOL TARTRATE 25 MG PO TABS: 25.0000 mg | ORAL_TABLET | Freq: Two times a day (BID) | ORAL | 3 refills | Status: DC

## 2021-04-24 MED ORDER — PANTOPRAZOLE SODIUM 40 MG PO TBEC: 40.0000 mg | DELAYED_RELEASE_TABLET | Freq: Every day | ORAL | 3 refills | Status: DC

## 2021-06-09 ENCOUNTER — Telehealth: Payer: Self-pay | Admitting: Cardiology

## 2021-06-09 DIAGNOSIS — I4819 Other persistent atrial fibrillation: Secondary | ICD-10-CM

## 2021-06-09 NOTE — Telephone Encounter (Signed)
Spoke with son Christen Bame about patient needing blood work on Monday and for him to pick up a bottle of Xarelto samples, along with the assistance paperwork.

## 2021-06-09 NOTE — Telephone Encounter (Signed)
Recommend rechecking BMET to determine correct dose of Xarelto - his SCr has been ~1, slightly higher at 1.29 on 04/13/21 but shot up to 2.15 on 04/19/21 and hasn't been checked again since. Using most recent SCr of 2.15, he's on the wrong dose of Xarelto and would require 15mg  daily, but that SCr is abnormally high compared to his others, and using all other SCr values he's on correct dose of Xarelto 20mg .  Please have pt come in for BMET. Ok to give him 1 week of samples of the 20mg  dose since it looks like he'll run out this weekend before labs are back.  Cannot sample pt until the end of the year though, looks like he's been getting samples since at least April and he shouldn't have been in the donut hole then. His Part D insurance isn't scanned in so I can't tell if another DOAC would be more affordable, but he needs to fill out pt assistance paperwork if he hasn't already as he cannot rely on samples permanently for Xarelto.

## 2021-06-09 NOTE — Telephone Encounter (Signed)
Patient calling the office for samples of medication:   1.  What medication and dosage are you requesting samples for? rivaroxaban (XARELTO) 20 MG TABS tablet  2.  Are you currently out of this medication? 1 or two pills left

## 2021-06-12 LAB — BASIC METABOLIC PANEL
BUN/Creatinine Ratio: 14 (ref 10–24)
BUN: 17 mg/dL (ref 8–27)
CO2: 24 mmol/L (ref 20–29)
Calcium: 8.6 mg/dL (ref 8.6–10.2)
Chloride: 98 mmol/L (ref 96–106)
Creatinine, Ser: 1.22 mg/dL (ref 0.76–1.27)
Glucose: 96 mg/dL (ref 70–99)
Potassium: 4.5 mmol/L (ref 3.5–5.2)
Sodium: 137 mmol/L (ref 134–144)
eGFR: 60 mL/min/{1.73_m2} (ref >59)

## 2021-06-22 ENCOUNTER — Telehealth: Payer: Self-pay | Admitting: Cardiology

## 2021-06-22 NOTE — Telephone Encounter (Signed)
Routing back to nl refills to package sample and pt assistance form and contact the pt Pt qualifies for samples of xarelto 20 mg but please include pt assistance form as follows: [email or web address]

## 2021-06-22 NOTE — Telephone Encounter (Signed)
Patient calling the office for samples of medication: ° ° °1.  What medication and dosage are you requesting samples for? rivaroxaban (XARELTO) 20 MG TABS tablet ° °2.  Are you currently out of this medication? yes ° ° °

## 2021-06-22 NOTE — Telephone Encounter (Addendum)
Samples packaged and left at the front desk for patient.   Spoke with patient's daughter (okay per DPR) advised her that samples are at front desk for pick up, and that patient assistance application is with them as well. Patient's daughter states she will come by tomorrow to pick up samples and she states she will return application once complete.   Advised patient's to call back to office with any issues, questions, or concerns. Patient's daughter verbalized understanding.   Samples of Xarelto 20mg  were left at front desk for the patient, quantity 4 Bottles, Lot Number EXP: [id number]

## 2021-07-03 ENCOUNTER — Telehealth: Payer: Self-pay

## 2021-07-03 NOTE — Telephone Encounter (Signed)
   [facility] Pre-operative Risk Assessment    Patient Name: Gregory Wheeler  DOB: 1941/01/27 MRN: [id number]  Request for surgical clearance:  What type of surgery is being performed? SPINAL CORD STIMULATOR TRIAL & PERMANENT PLACEMENT IF POSITIVE RESULT  When is this surgery scheduled? TBD  What type of clearance is required (medical clearance vs. Pharmacy clearance to hold med vs. Both)? MEDICATION  Are there any medications that need to be held prior to surgery and how long? [place] name and name of physician performing surgery? [facility] DR [facility]  What is the office phone number? [phone]   7.   What is the office fax number? [phone]  8.   Anesthesia type (None, local, MAC, general) ?

## 2021-07-04 NOTE — Telephone Encounter (Signed)
Clinical pharmacist to review Xarelto 

## 2021-07-04 NOTE — Telephone Encounter (Signed)
Patient with diagnosis of A Fib on Xarelto for anticoagulation.    Procedure: SPINAL CORD STIMULATOR TRIAL & PERMANENT PLACEMENT IF POSITIVE RESULT Date of procedure: TBD   CHA2DS2-VASc Score = 4  This indicates a 4.8% annual risk of stroke. The patient's score is based upon: CHF History: 0 HTN History: 1 Diabetes History: 0 Stroke History: 0 Vascular Disease History: 1 Age Score: 2 Gender Score: 0   CrCl 71mL/min Platelet count 354K  Per office protocol, patient can hold Xarelto for 3 days prior to procedure.

## 2021-07-05 NOTE — Telephone Encounter (Signed)
    Patient Name: Gregory Wheeler  DOB: Mar 25, 1941 MRN: [id number]  Primary Cardiologist: Peter Swaziland, MD  Chart reviewed as part of pre-operative protocol coverage. Patient has plans for upcoming spinal cord stimulator trial with permanent placement if positive and we were asked to give our recommendations for holding Xarelto. Per Pharmacy and office protocol, "patient can hold Xarelto for 3 days prior to procedure." Please restart this as soon as able following procedure.   I will route this recommendation to the requesting party via Epic fax function and remove from pre-op pool.  Please call with questions.  Corrin Parker, PA-C 07/05/2021, 9:42 AM '

## 2021-07-13 ENCOUNTER — Telehealth: Payer: Self-pay | Admitting: Cardiology

## 2021-07-13 NOTE — Telephone Encounter (Signed)
Patient calling the office for samples of medication:   1.  What medication and dosage are you requesting samples for? rivaroxaban (XARELTO) 20 MG TABS tablet  2.  Are you currently out of this medication? Pt is out of this medicine

## 2021-07-14 ENCOUNTER — Other Ambulatory Visit: Payer: Self-pay

## 2021-07-14 DIAGNOSIS — I4819 Other persistent atrial fibrillation: Secondary | ICD-10-CM

## 2021-07-14 MED ORDER — RIVAROXABAN 20 MG PO TABS: 20.0000 mg | ORAL_TABLET | Freq: Every day | ORAL | 1 refills | Status: DC

## 2021-07-14 NOTE — Telephone Encounter (Signed)
Prescription refill request for Xarelto received.  Indication:Afib Last office visit:8/22 Weight:105.9 kg Age:80 Scr:1.2 CrCl:74.77 ml/min  Prescription refilled

## 2021-07-18 NOTE — Telephone Encounter (Signed)
Pt's daughter is following up on Xarelto Samples from last week, she says she have not heard back from our office

## 2021-07-19 ENCOUNTER — Telehealth: Payer: Self-pay

## 2021-07-19 NOTE — Telephone Encounter (Signed)
I called to speak to patient today for pre-op evaluation and while I was on the phone his daughter was asking about Xarelto samples. It sounds like they have hit his donut hole and were hoping for samples to get him through the remainder of the year. Do why have samples we can give him? Can someone try reaching them again?  Thank you so much!

## 2021-07-19 NOTE — Telephone Encounter (Signed)
    Patient Name: Gregory Wheeler  DOB: [birth year] MRN: [id number]  Primary Cardiologist: Peter Swaziland, MD  Chart reviewed as part of pre-operative protocol coverage. Patient was last seen by Dr. Swaziland in 03/2021. He was contacted for further pre-op evaluation and reported doing well from a cardiac standpoint since his last visit. He denies any chest pain, shortness of breath, orthopnea/PND, palpitations, or syncope. He activity is very limited by his severe back pain which is why he is having spinal cord stimulator placed. However, he states before his back started really bothering him he was able to ambulate fine. Given past medical history and time since last visit, based on ACC/AHA guidelines, DOMENICO ACHORD would be at acceptable risk for the planned procedure without further cardiovascular testing.   Pharmacy previously stated patient could hold Xarelto for 3 days prior to spinal cord stimulator trial (and permanent placement if positive) on a request form earlier this month. Please restart Xarelto as soon as possible following procedure.  I will route this recommendation to the requesting party via Epic fax function and remove from pre-op pool.  Please call with questions.  Corrin Parker, PA-C 07/19/2021, 1:53 PM

## 2021-07-19 NOTE — Telephone Encounter (Signed)
   [facility] Pre-operative Risk Assessment    Patient Name: Gregory Wheeler  DOB: [birth year] MRN: [id number]   Request for surgical clearance:  What type of surgery is being performed   Spinal Cord Stimulator   When is this surgery scheduled TBD  What type of clearance is required   Both  Are there any medications that need to be held prior to surgery and how long  Xarelto  Practice name and name of physician performing surgery  [facility]   What is the office phone number  [phone]   7.   What is the office fax number   [phone]  8.   Anesthesia type  Not listed   Neoma Laming 07/19/2021, 1:11 PM  _________________________________________________________________   (provider comments below)

## 2021-07-19 NOTE — Telephone Encounter (Signed)
Called patient but was unable to leave a message due to the voicemail being full.

## 2021-07-19 NOTE — Telephone Encounter (Signed)
Spoke to patient's daughter Christella Hartigan Xarelto 20 mg samples left at front desk.Stated she will complete patient assistance paperwork and bring to me soon to fax.

## 2021-08-15 ENCOUNTER — Telehealth: Payer: Self-pay | Admitting: Cardiology

## 2021-08-15 NOTE — Telephone Encounter (Signed)
Spoke with daughter, patient is needing samples of xarelto 20mg   Daughter has patient assistance application, needs to get proof of income, and will return to office

## 2021-08-15 NOTE — Telephone Encounter (Signed)
Medication samples have been provided to the patient.  Drug name: xarelto 20mg    Qty: 3 bottles  LOT: [id number]  Exp.Date: 10/24  Samples left at front desk for patient pick-up. Patient notified.  11/24 M 3:06 PM 08/15/2021

## 2021-08-15 NOTE — Telephone Encounter (Signed)
Patient's daughter Christella Hartigan called to talk with Dr. Swaziland or nurse in regards to patient. Please call back

## 2021-08-15 NOTE — Telephone Encounter (Signed)
Okay for Xarelto 20mg samples?

## 2021-09-05 ENCOUNTER — Telehealth: Payer: Self-pay

## 2021-09-05 NOTE — Telephone Encounter (Signed)
° °  Pre-operative Risk Assessment    Patient Name: Gregory Wheeler  DOB: 1941-08-17 MRN: [id number]      Request for Surgical Clearance    Procedure:   Spinal Cord Stimulator  Date of Surgery:  Clearance 09/22/21                                 Surgeon:  Maryruth Eve Surgeon's Group or Practice Name:  [facility] Phone number:  [phone] Fax number:  [phone]   Type of Clearance Requested:   - Medical  - Pharmacy:  Hold Rivaroxaban (Xarelto)     Type of Anesthesia:  Not Indicated   Additional requests/questions:  Please advise surgeon/provider what medications should be held. Please fax a copy of clearance to the surgeon's office.  Vangie Bicker   09/05/2021, 4:16 PM

## 2021-09-06 NOTE — Telephone Encounter (Signed)
Patient with diagnosis of atrial fibrillation on Xarelto for anticoagulation.    Procedure: spinal cord stimulator Date of procedure: 09/22/21   CHA2DS2-VASc Score = 4   This indicates a 4.8% annual risk of stroke. The patient's score is based upon: CHF History: 0 HTN History: 1 Diabetes History: 0 Stroke History: 0 Vascular Disease History: 1 Age Score: 2 Gender Score: 0   CrCl 60 Platelet count 354  Per office protocol, patient can hold Xarelto for 3 days prior to procedure.   Patient will not need bridging with Lovenox (enoxaparin) around procedure.

## 2021-09-07 NOTE — Telephone Encounter (Signed)
° °  Name: Gregory Wheeler  DOB: May 15, 1941  MRN: [id number]   Primary Cardiologist: Peter Swaziland, MD  Chart reviewed as part of pre-operative protocol coverage. Patient was contacted 09/07/2021 in reference to pre-operative risk assessment for pending surgery as outlined below.  Gregory Wheeler was last seen on 04/24/2021 by Dr. Swaziland.  Since that day, Gregory Wheeler has done well without exertional chest pain or worsening dyspnea.  Therefore, based on ACC/AHA guidelines, the patient would be at acceptable risk for the planned procedure without further cardiovascular testing.   Patient may hold Xarelto for 3 days prior to the procedure and restart as soon as possible afterward at his surgeon's discretion  The patient was advised that if he develops new symptoms prior to surgery to contact our office to arrange for a follow-up visit, and he verbalized understanding.  I will route this recommendation to the requesting party via Epic fax function and remove from pre-op pool. Please call with questions.  [place], [place] 09/07/2021, 11:54 AM

## 2021-09-27 ENCOUNTER — Telehealth: Payer: Self-pay | Admitting: Cardiology

## 2021-09-27 ENCOUNTER — Telehealth: Payer: Self-pay | Admitting: *Deleted

## 2021-09-27 DIAGNOSIS — I4819 Other persistent atrial fibrillation: Secondary | ICD-10-CM

## 2021-09-27 MED ORDER — RIVAROXABAN 20 MG PO TABS: 20.0000 mg | ORAL_TABLET | Freq: Every day | ORAL | 0 refills | Status: DC

## 2021-09-27 NOTE — Telephone Encounter (Signed)
Let patient know that samples has been left up front for pick-up.

## 2021-09-27 NOTE — Telephone Encounter (Signed)
° °  Patient calling the office for samples of medication:   1.  What medication and dosage are you requesting samples for? rivaroxaban (XARELTO) 20 MG TABS tablet  2.  Are you currently out of this medication? Yes   Pt's daughter said, pt is out of meds and needs samples, she said she can pick it up today

## 2021-10-16 NOTE — Progress Notes (Signed)
Gregory Wheeler Date of Birth: 1941/05/08   History of Present Illness: Gregory Wheeler is seen for follow up of CAD and atrial fibrillation.  He has a history of coronary disease and is status post stenting of the LAD with overlapping Taxus stents in 2009. Myoview in April 2015 showed mild apical thinning, normal EF 67%.  He  was seen in the ED in February 2016 for symptoms of left arm numbness and pain. This was worse with lifting his arm. Ecg was done and demonstrated tachycardia with no clear P waves and rate 113.   He was seen in June 2016 symptoms of fatigue and acute gout flair in his right knee bursa. He was noted to be in atrial fibrillation with RVR (and review of ECG from February showed the same). He had been drinking Etoh heavily. He was started on metoprolol and Xarelto.   He was scheduled for a DCCV on 03/03/15 but on arrival he was in NSR with rate 77.   He was seen in the ED on 06/20/18 with abdominal pain and had an umbilical hernia reduced.   He was seen in June 2020  with symptoms of increased DOE and fatigue. Was noted to be anemic with Hgb 9. Low folate. Normal BNP. Echo was normal. Myoview was low risk without significant ischemia and unchanged from prior studies. He was started on iron therapy. GI evaluation was warranted but he did not pursue. Hgb did improve to 11.6. He is also B12 deficient and on shots.   He was admitted in November with acute encephalopathy. Had tested positive for Covid 19 10 days before. He had respiratory failure and required intubation. He had enterococcal bacteremia treated with 6 weeks of IV antibiotics. He had GI bleeding with EGD showing some gastritis. He had been drinking Etoh heavily and had DTs. He remained significantly deconditioned. Palliative care was consulted and he was a DNR. Echo showed normal EF. No significant valvular abnormality. Moderate RV dysfunction. He had third spacing of fluid with edema. Persistent dysphagia due to deconditioning.  He had urinary retention that later resolved. When seen in follow up with PCP he was persistently hypotensive and amlodipine was discontinued.   Seen with his daughter in law today.  He is living by himself and able to do his ADLs. He is having more problems with back problems and had a nerve stimulator placed. Still having a lot of pain between his shoulders. Gets around with a walker. Gets SOB with walking. No chest pain, edema  or palpitations. Last week had a spell where he almost passed out. EMS called. Noted low BP < 100. He has not taken metoprolol since then.   Current Outpatient Medications on File Prior to Visit  Medication Sig Dispense Refill   acetaminophen (TYLENOL) 325 MG tablet Take 650 mg by mouth 2 (two) times daily.     allopurinol (ZYLOPRIM) 300 MG tablet Take 1 tablet (300 mg total) by mouth at bedtime. 90 tablet 3   ALPRAZolam (XANAX) 0.5 MG tablet Take 0.5-1 mg by mouth at bedtime.     ascorbic acid (VITAMIN C) 1000 MG tablet Take 1,000 mg by mouth daily.     famotidine (PEPCID) 40 MG tablet Take 40 mg by mouth daily as needed for heartburn or indigestion.     folic acid (FOLVITE) 1 MG tablet Take 2 mg by mouth daily.      pantoprazole (PROTONIX) 40 MG tablet Take 1 tablet (40 mg total) by mouth daily.  90 tablet 3   rivaroxaban (XARELTO) 20 MG TABS tablet Take 1 tablet (20 mg total) by mouth daily with supper. 28 tablet 0   No current facility-administered medications on file prior to visit.    Allergies  Allergen Reactions   Lorazepam Other (See Comments)    Extreme delirium     Past Medical History:  Diagnosis Date   Arthritis    Atrial fibrillation ([place]) 01/31/2015   Chronic gout    Coronary artery disease 12/2007   STATUS POST STENTING OF THE LEFT ANTERIOR DESCENDING ARTERY   COVID-19    Dyslipidemia    Gout    Hiatal hernia    Hyperlipidemia    Hypertension    LBP (low back pain)    Partial small bowel obstruction (HCC)    RBBB (right bundle branch  block)    SBO (small bowel obstruction) (HCC)    PARTIAL   Second degree AV block, Mobitz type I    Urinary tract infection    Escherichia coli urinary tract infection    Past Surgical History:  Procedure Laterality Date   CARDIAC CATHETERIZATION     ESOPHAGOGASTRODUODENOSCOPY N/A 06/30/2020   Procedure: ESOPHAGOGASTRODUODENOSCOPY (EGD);  Surgeon: Clarene Essex, MD;  Location: [place] [facility];  Service: Endoscopy;  Laterality: N/A;   HERNIA REPAIR     INGUINAL   ROTATOR CUFF REPAIR      Social History   Tobacco Use  Smoking Status Former   Types: Cigarettes   Quit date: 08/27/1990   Years since quitting: 31.1  Smokeless Tobacco Never    Social History   Substance and Sexual Activity  Alcohol Use Yes   Comment: near daily    Family History  Problem Relation Age of Onset   Emphysema Mother 28   Heart attack Father 56    Review of Systems: As noted in HPI All other systems were reviewed and are negative.  Physical Exam: BP 116/70 (BP Location: Left Arm, Patient Position: Sitting, Cuff Size: Normal)    Pulse (!) 109    Ht 5\' 10"  (1.778 m)    Wt 240 lb (108.9 kg)    BMI 34.44 kg/m  GENERAL:  Overweight WM in NAD HEENT:  PERRL, EOMI, sclera are clear. Oropharynx is clear. NECK:  No jugular venous distention, carotid upstroke brisk and symmetric, no bruits, no thyromegaly or adenopathy LUNGS:  Clear to auscultation bilaterally CHEST:  Unremarkable HEART:  RRR,  PMI not displaced or sustained,S1 and S2 within normal limits, no S3, no S4: no clicks, no rubs, no murmurs ABD:  Soft, nontender. BS +, no masses or bruits. No hepatomegaly, no splenomegaly EXT:  2 + pulses throughout, no edema, no cyanosis no clubbing SKIN:  Warm and dry.  No rashes NEURO:  Alert and oriented x 3. Cranial nerves II through XII intact. PSYCH:  Cognitively intact   LABORATORY DATA: Lab Results  Component Value Date   WBC 8.9 07/11/2020   HGB 8.3 (L) 07/11/2020   HCT 27.2 (L) 07/11/2020   PLT  323 07/11/2020   GLUCOSE 96 06/12/2021   CHOL 176 06/10/2017   TRIG 100 07/05/2020   HDL 66 06/10/2017   LDLCALC 85 06/10/2017   ALT 28 07/11/2020   AST 28 07/11/2020   NA 137 06/12/2021   K 4.5 06/12/2021   CL 98 06/12/2021   CREATININE 1.22 06/12/2021   BUN 17 06/12/2021   CO2 24 06/12/2021   TSH 4.300 07/01/2018   INR 1.4 04/24/2016   HGBA1C  5.9 (H) 06/29/2020   Dated 12/30/18: cholesterol 129, triglycerides 97, HDL 47, LDL 85. Normal TSH. Dated 03/27/19: Hgb 11.3. ferritin 17. B12 175,  Dated 11/24/19: Hgb 9.8. albumin 3.2. otherwise CMET normal. B12 and folate normal. Iron 36 with sat 11%. Ferritin 15.  Dated 10/06/21: sodium 130, creatinine 1.96. otherwise CMET normal. TSH normal. Hgb 9.1. iron 23 with sat 5%. TIBC normal.  Ecg today shows rate 109. There is considerable electrical artifact due to his nerve stimulator. Unable to tell if this is sinus but it is regular. RBBB. LAD. I have personally reviewed and interpreted this study.   Echo: 02/09/15  Study Conclusions  - Left ventricle: The cavity size was normal. There was mild focal   basal hypertrophy of the septum. Systolic function was normal.   The estimated ejection fraction was in the range of 60% to 65%.   Wall motion was normal; there were no regional wall motion   abnormalities. - Aortic valve: Trileaflet; normal thickness, mildly calcified   leaflets. - Mitral valve: Calcified annulus. - Pulmonic valve: There was trivial regurgitation.  Echo 02/17/19: IMPRESSIONS      1. The left ventricle has normal systolic function, with an ejection fraction of 55-60%. The cavity size was normal. There is mildly increased left ventricular wall thickness. Left ventricular diastolic Doppler parameters are consistent with impaired  relaxation. Indeterminate filling pressures The E/e' is 8-15. No evidence of left ventricular regional wall motion abnormalities.  2. The right ventricle has normal systolic function. The cavity was  normal. There is no increase in right ventricular wall thickness.  3. The mitral valve is grossly normal.  4. The tricuspid valve is grossly normal.  5. The aortic valve is abnormal. Mild sclerosis of the aortic valve. Aortic valve regurgitation is mild by color flow Doppler. No stenosis of the aortic valve.  6. 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  7. When compared to the prior study: 02/09/15 EF 60-65%.  Myoview 02/12/19: Study Highlights    The left ventricular ejection fraction is normal (55-65%). Nuclear stress EF: 60%. There was no ST segment deviation noted during stress. Defect 1: There is a medium defect of moderate severity present in the basal inferolateral, mid inferolateral and apex location. This is a low risk study.   Normal, low risk stress nuclear study with prominent inferior and apical thinning but no ischemia.  Gated ejection fraction 60% with normal wall motion.    Echo 06/23/20: IMPRESSIONS     1. Left ventricular ejection fraction, by estimation, is 60 to 65%. The  left ventricle has normal function. The left ventricle has no regional  wall motion abnormalities. There is mild left ventricular hypertrophy.  Left ventricular diastolic parameters  are consistent with Grade I diastolic dysfunction (impaired relaxation).   2. Right ventricular systolic function is moderately reduced. The right  ventricular size is normal. There is normal pulmonary artery systolic  pressure. The estimated right ventricular systolic pressure is [id number] mmHg.  Abnormal RV, would consider PE.   3. Right atrial size was mildly dilated.   4. The mitral valve is normal in structure. No evidence of mitral valve  regurgitation. No evidence of mitral stenosis.   5. The aortic valve is tricuspid. Aortic valve regurgitation is trivial.  Mild aortic valve sclerosis is present, with no evidence of aortic valve  stenosis.   6. The inferior vena cava is normal in size  with greater than 50%  respiratory variability, suggesting  right atrial pressure of 3 mmHg.   Assessment / Plan: 1. Coronary disease status post stenting of the LAD in 2009 with Taxus stents. Nuclear stress test in June 2020 was low risk and unchanged from prior. Echo normal. He has no significant angina.  Continue with risk factor modification. No Antiplatelet therapy now that he is on Xarelto.   2. Atrial fibrillation- paroxysmal. Difficult to tell rhythm today due to nerve stimulator. Rate is faster but this may be due to stopping his metoprolol. Will resume metoprolol at 12.5 mg bid.   He is asymptomatic. [place] vasc score is 3.  Avoid NSAIDs. Now on Xarelto.   3. Hyperlipidemia   4. History of Mobitz type 1 second degree AV block. RBBB. If he has recurrent syncope will need to have him wear a monitor.   5. HTN   controlled.   6. Gout. Asymptomatic.  7. Etoh abuse. Recommend total abstinence.   8. Obesity.   9. Peripheral neuropathy.   10. Anemia. With iron deficiency. Intolerant to oral iron. Follow up with primary care.    I will follow up in 6 months.

## 2021-10-17 ENCOUNTER — Telehealth: Payer: Self-pay | Admitting: Cardiology

## 2021-10-17 NOTE — Telephone Encounter (Signed)
Pt c/o medication issue:  1. Name of Medication:    2. How are you currently taking this medication (dosage and times per day)?    3. Are you having a reaction (difficulty breathing--STAT)? no  4. What is your medication issue?   Daughter calling in regarding father medication and his bp has been dropping. Bottom number is 39. Please advise

## 2021-10-17 NOTE — Telephone Encounter (Signed)
Spoke with [place] who states that the pt's BP was 88/39 HR 69 lst pm and after eating an apple with salt it came up to 105/59. [place] states that the pt's PCP has stopped his Metoprolol today. Advised to keep [place] updated on BP and how the pt is doing. Pt can add extra salt and fluids for low BP. Both verbalized understanding and had no additional questions.

## 2021-10-23 ENCOUNTER — Encounter: Payer: Self-pay | Admitting: Cardiology

## 2021-10-23 ENCOUNTER — Ambulatory Visit (INDEPENDENT_AMBULATORY_CARE_PROVIDER_SITE_OTHER): Payer: Medicare Other | Admitting: Cardiology

## 2021-10-23 ENCOUNTER — Other Ambulatory Visit: Payer: Self-pay

## 2021-10-23 VITALS — BP 116/70 | HR 109 | Ht 70.0 in | Wt 240.0 lb

## 2021-10-23 DIAGNOSIS — I10 Essential (primary) hypertension: Secondary | ICD-10-CM

## 2021-10-23 DIAGNOSIS — I2510 Atherosclerotic heart disease of native coronary artery without angina pectoris: Secondary | ICD-10-CM | POA: Diagnosis not present

## 2021-10-23 DIAGNOSIS — I480 Paroxysmal atrial fibrillation: Secondary | ICD-10-CM

## 2021-10-23 MED ORDER — METOPROLOL TARTRATE 25 MG PO TABS: 12.5000 mg | ORAL_TABLET | Freq: Two times a day (BID) | ORAL | 3 refills | Status: DC

## 2021-10-23 NOTE — Patient Instructions (Signed)
Resume metoprolol at 12.5 mg twice a day

## 2021-10-23 NOTE — Addendum Note (Signed)
Addended by: Neoma Laming on: 10/23/2021 09:58 AM   Modules accepted: Orders

## 2021-11-15 ENCOUNTER — Telehealth: Payer: Self-pay | Admitting: Cardiology

## 2021-11-15 NOTE — Telephone Encounter (Signed)
Spoke to patient's daughter [name] she requested Xarelto 20 mg samples.Samples left at front desk. ?

## 2021-11-15 NOTE — Telephone Encounter (Signed)
Patient's daughter called wanting to speak to [place]. ?

## 2021-12-20 ENCOUNTER — Telehealth: Payer: Self-pay | Admitting: Cardiology

## 2021-12-20 NOTE — Telephone Encounter (Signed)
Dr [place] patient has been receiving Xarelto samples for 5 years.  Looks like the daughter has been given patient assistance applications before. Do not see it has ever been completed. ? ?Should not be in donut hole now and has been consistently relying on samples.   ? ?There is a trial enrolling now comparing Eliquis to a Factor XII inhibitor.  If patient meets criteria will get 3 years of med for free.  Thoughts? ?

## 2021-12-20 NOTE — Telephone Encounter (Signed)
Pt c/o medication issue: ? ?1. Name of Medication: rivaroxaban (XARELTO) 20 MG TABS tablet ? ?2. How are you currently taking this medication (dosage and times per day)? Take 1 tablet (20 mg total) by mouth daily with supper. ? ?3. Are you having a reaction (difficulty breathing--STAT)? no ? ?4. What is your medication issue? Calling to see if we have any samples  ? ?

## 2021-12-21 NOTE — Telephone Encounter (Signed)
Received a call from patient's daughter Christella Hartigan.Advised father needs to complete patient assistance form for Xarelto.Advised to bring back to office along with proof of income.I will fax to patient assistance.Advised I will leave Xarelto 20 mg samples at front desk. ?

## 2021-12-21 NOTE — Telephone Encounter (Signed)
Patient's daughter is calling back about the rivaroxaban (XARELTO) 20 MG TABS tablet samples ?

## 2022-01-17 ENCOUNTER — Telehealth: Payer: Self-pay | Admitting: Cardiology

## 2022-01-17 NOTE — Telephone Encounter (Signed)
   Pt's daughter is requesting to get a call back from [place], she did not provide info she said, there's something she needs to discuss with her

## 2022-01-17 NOTE — Telephone Encounter (Signed)
Patient's daughter has been made aware:  Medication Samples have been provided to the patient.  Drug name: Xarelto       Strength: 20 mg        Qty: 3  LOT: [id number]  Exp.Date: 03/25

## 2022-01-17 NOTE — Telephone Encounter (Signed)
Patient's daughter was calling for some samples. She stated that she would also bring the patient assistance forms back up here.  Currently on Xarelto 20 mg. Message sent to pharmd for approval.

## 2022-01-18 ENCOUNTER — Telehealth: Payer: Self-pay | Admitting: Cardiology

## 2022-01-18 NOTE — Telephone Encounter (Signed)
Pt's daughter calling to speak with nurse Elnita Maxwell regarding pt. Please advise

## 2022-01-18 NOTE — Telephone Encounter (Signed)
Returned call to [name] (daughter) (DPR) informed that Gregory Wheeler is out of the office until sometime next week. She states that she will wait for cheryl to return.

## 2022-02-09 ENCOUNTER — Telehealth: Payer: Self-pay | Admitting: Cardiology

## 2022-02-09 NOTE — Telephone Encounter (Signed)
Pt needing Xarelto sampels and has been receiving multiple sampels ( PLEASE see telephone encounter from 12/20/2021.) Pt was to apply for patient assistance.   Age:81 yo Weight:108.9kg Scr: 1.22 (06/12/2021) CrCl: 74 ml/min   Per dosing criteria pt is on the correct dose of Xarelto 20mg  daily.

## 2022-02-09 NOTE — Telephone Encounter (Signed)
Left message to call back  

## 2022-02-09 NOTE — Telephone Encounter (Signed)
Follow Up:   Gregory Wheeler says she was talking to a Triage nurse and was disconnected.

## 2022-02-09 NOTE — Telephone Encounter (Signed)
Daughter requesting samples of Xarelto. Will forward to Pharm D for approval

## 2022-02-09 NOTE — Telephone Encounter (Signed)
Pt's daughter calling back. She said that she was speaking with nurse and the call was dropped. Please advise

## 2022-02-09 NOTE — Telephone Encounter (Signed)
Pt called back to ask about status of Xarelto samples. I spoke to Mellody Dance, RN and informed the pt to fill out patient assistance forms and they will call her back if samples are available.

## 2022-02-09 NOTE — Telephone Encounter (Addendum)
-  Samples placed up front for pick up. -Nurse also helped daughter complete patient assistance application via phone.  -She will dropped off forms today.   Xarelto 20 mg Qty: 3 bottles Lot# [id number] Exp: 3/25

## 2022-02-09 NOTE — Telephone Encounter (Signed)
Patient's daughter called wanting to speak with Elnita Maxwell

## 2022-02-19 ENCOUNTER — Other Ambulatory Visit: Payer: Self-pay

## 2022-02-19 DIAGNOSIS — I4819 Other persistent atrial fibrillation: Secondary | ICD-10-CM

## 2022-02-19 MED ORDER — RIVAROXABAN 20 MG PO TABS: 20.0000 mg | ORAL_TABLET | Freq: Every day | ORAL | 3 refills | Status: DC

## 2022-02-28 NOTE — Telephone Encounter (Signed)
Spoke to patient's daughter Basil Dess advised wrong patient assistance form completed for Xarelto.Advised I will mail Xarelto patient assistance form.She will complete and bring back to office.

## 2022-03-04 ENCOUNTER — Other Ambulatory Visit: Payer: Self-pay

## 2022-03-04 ENCOUNTER — Encounter (HOSPITAL_BASED_OUTPATIENT_CLINIC_OR_DEPARTMENT_OTHER): Payer: Self-pay

## 2022-03-04 ENCOUNTER — Emergency Department (HOSPITAL_BASED_OUTPATIENT_CLINIC_OR_DEPARTMENT_OTHER)
Admission: EM | Admit: 2022-03-04 | Discharge: 2022-03-05 | Disposition: A | Discharge status: Home / Self Care | Payer: Medicare Other | Attending: Emergency Medicine | Admitting: Emergency Medicine

## 2022-03-04 DIAGNOSIS — R443 Hallucinations, unspecified: Secondary | ICD-10-CM | POA: Diagnosis not present

## 2022-03-04 DIAGNOSIS — Z7901 Long term (current) use of anticoagulants: Secondary | ICD-10-CM | POA: Diagnosis not present

## 2022-03-04 DIAGNOSIS — R4182 Altered mental status, unspecified: Secondary | ICD-10-CM | POA: Diagnosis present

## 2022-03-04 DIAGNOSIS — E871 Hypo-osmolality and hyponatremia: Secondary | ICD-10-CM | POA: Insufficient documentation

## 2022-03-04 DIAGNOSIS — F1020 Alcohol dependence, uncomplicated: Secondary | ICD-10-CM | POA: Diagnosis not present

## 2022-03-04 DIAGNOSIS — D72829 Elevated white blood cell count, unspecified: Secondary | ICD-10-CM | POA: Diagnosis not present

## 2022-03-04 DIAGNOSIS — F1090 Alcohol use, unspecified, uncomplicated: Secondary | ICD-10-CM

## 2022-03-04 DIAGNOSIS — E860 Dehydration: Secondary | ICD-10-CM

## 2022-03-04 DIAGNOSIS — R7401 Elevation of levels of liver transaminase levels: Secondary | ICD-10-CM | POA: Diagnosis not present

## 2022-03-04 DIAGNOSIS — Y900 Blood alcohol level of less than 20 mg/100 ml: Secondary | ICD-10-CM | POA: Diagnosis not present

## 2022-03-04 LAB — COMPREHENSIVE METABOLIC PANEL
ALT: 26 U/L (ref 0–44)
AST: 57 U/L — ABNORMAL HIGH (ref 15–41)
Albumin: 3.1 g/dL — ABNORMAL LOW (ref 3.5–5.0)
Alkaline Phosphatase: 102 U/L (ref 38–126)
Anion gap: 13 (ref 5–15)
BUN: 7 mg/dL — ABNORMAL LOW (ref 8–23)
CO2: 28 mmol/L (ref 22–32)
Calcium: 9.6 mg/dL (ref 8.9–10.3)
Chloride: 93 mmol/L — ABNORMAL LOW (ref 98–111)
Creatinine, Ser: 0.86 mg/dL (ref 0.61–1.24)
GFR, Estimated: >60 mL/min (ref >60)
Glucose, Bld: 84 mg/dL (ref 70–99)
Potassium: 3.5 mmol/L (ref 3.5–5.1)
Sodium: 134 mmol/L — ABNORMAL LOW (ref 135–145)
Total Bilirubin: 1.1 mg/dL (ref 0.3–1.2)
Total Protein: 6 g/dL — ABNORMAL LOW (ref 6.5–8.1)

## 2022-03-04 LAB — CBC
HCT: 37.8 % — ABNORMAL LOW (ref 39.0–52.0)
Hemoglobin: 12.9 g/dL — ABNORMAL LOW (ref 13.0–17.0)
MCH: 37.7 pg — ABNORMAL HIGH (ref 26.0–34.0)
MCHC: 34.1 g/dL (ref 30.0–36.0)
MCV: 110.5 fL — ABNORMAL HIGH (ref 80.0–100.0)
Platelets: 208 10*3/uL (ref 150–400)
RBC: 3.42 MIL/uL — ABNORMAL LOW (ref 4.22–5.81)
RDW: 13.2 % (ref 11.5–15.5)
WBC: 12.7 10*3/uL — ABNORMAL HIGH (ref 4.0–10.5)
nRBC: 0 % (ref 0.0–0.2)

## 2022-03-04 LAB — CBG MONITORING, ED: Glucose-Capillary: 79 mg/dL (ref 70–99)

## 2022-03-04 NOTE — ED Notes (Signed)
Pt awake and alert- oriented to person, place, year - confused to some recent history.  No acute distress noted.  RR even and unlabored on RA with symmetrical rise and fall of chest.  Continuous cardiac and pulse ox monitoring maintained.  Labs collected with pending results-- awaits UA sample.  Will continue to monitor for acute changes and maintain plan of care.

## 2022-03-04 NOTE — ED Notes (Signed)
ED Provider at bedside. 

## 2022-03-04 NOTE — ED Triage Notes (Signed)
Patient here POV from Home.  Son at the Bedside endorses Patient has been Confused more recently. This has been occurring for approximately 1 Week. Son endorses Patient has been Hallucinating and seeing Objects and Creatures that don't exist.   Son Also endorses Patient drinks approximately Half a Gallon of Mixed Drinks a Day.  No Known Fevers. No New Pain.   NAD Noted during Triage. Currently A&Ox4. GCS 15. BIB Wheelchair.

## 2022-03-04 NOTE — ED Provider Notes (Signed)
[facility] EMERGENCY DEPT Provider Note   CSN: [id number] Arrival date & time: 03/04/22  2139     History {Add pertinent medical, surgical, social history, OB history to HPI:1} Chief Complaint  Patient presents with  . Hallucinations    Gregory Wheeler is a 81 y.o. male.  HPI     Home Medications Prior to Admission medications   Medication Sig Start Date End Date Taking? Authorizing Provider  acetaminophen (TYLENOL) 325 MG tablet Take 650 mg by mouth 2 (two) times daily.    [provider]  allopurinol (ZYLOPRIM) 300 MG tablet Take 1 tablet (300 mg total) by mouth at bedtime. 03/23/21   [place], Peter M, MD  ALPRAZolam Prudy Feeler) 0.5 MG tablet Take 0.5-1 mg by mouth at bedtime.    [provider]  ascorbic acid (VITAMIN C) 1000 MG tablet Take 1,000 mg by mouth daily.    [provider]  famotidine (PEPCID) 40 MG tablet Take 40 mg by mouth daily as needed for heartburn or indigestion.    [provider]  folic acid (FOLVITE) 1 MG tablet Take 2 mg by mouth daily.  01/21/19   [provider]  metoprolol tartrate (LOPRESSOR) 25 MG tablet Take 0.5 tablets (12.5 mg total) by mouth 2 (two) times daily. 10/23/21 10/18/22  [place], Peter M, MD  pantoprazole (PROTONIX) 40 MG tablet Take 1 tablet (40 mg total) by mouth daily. 04/24/21 04/19/22  [place], Peter M, MD  rivaroxaban (XARELTO) 20 MG TABS tablet Take 1 tablet (20 mg total) by mouth daily with supper. 02/19/22   [place], Peter M, MD      Allergies    Lorazepam    Review of Systems   Review of Systems  Physical Exam Updated Vital Signs BP (!) 165/76 (BP Location: Right Arm)   Pulse 76   Temp 97.8 F (36.6 C)   Resp 19   Ht 5\' 10"  (1.778 m)   Wt 108.9 kg   SpO2 94%   BMI 34.45 kg/m  Physical Exam  ED Results / Procedures / Treatments   Labs (all labs ordered are listed, but only abnormal results are displayed) Labs Reviewed  COMPREHENSIVE METABOLIC PANEL - Abnormal;  Notable for the following components:      Result Value   Sodium 134 (*)    Chloride 93 (*)    BUN 7 (*)    Total Protein 6.0 (*)    Albumin 3.1 (*)    AST 57 (*)    All other components within normal limits  CBC - Abnormal; Notable for the following components:   WBC 12.7 (*)    RBC 3.42 (*)    Hemoglobin 12.9 (*)    HCT 37.8 (*)    MCV 110.5 (*)    MCH 37.7 (*)    All other components within normal limits  URINALYSIS, ROUTINE W REFLEX MICROSCOPIC  ETHANOL  CBG MONITORING, ED    EKG None  Radiology No results found.  Procedures Procedures  {Document cardiac monitor, telemetry assessment procedure when appropriate:1}  Medications Ordered in ED Medications - No data to display  ED Course/ Medical Decision Making/ A&P                           Medical Decision Making Amount and/or Complexity of Data Reviewed Labs: ordered.   ***  {Document critical care time when appropriate:1} {Document review of labs and clinical decision tools ie heart score, Chads2Vasc2 etc:1}  {  Document your independent review of radiology images, and any outside records:1} {Document your discussion with family members, caretakers, and with consultants:1} {Document social determinants of health affecting pt's care:1} {Document your decision making why or why not admission, treatments were needed:1} Final Clinical Impression(s) / ED Diagnoses Final diagnoses:  None    Rx / DC Orders ED Discharge Orders     None

## 2022-03-05 ENCOUNTER — Emergency Department (HOSPITAL_BASED_OUTPATIENT_CLINIC_OR_DEPARTMENT_OTHER): Payer: Medicare Other

## 2022-03-05 DIAGNOSIS — R4182 Altered mental status, unspecified: Secondary | ICD-10-CM | POA: Diagnosis not present

## 2022-03-05 LAB — URINALYSIS, ROUTINE W REFLEX MICROSCOPIC
Bilirubin Urine: NEGATIVE
Glucose, UA: NEGATIVE mg/dL
Hgb urine dipstick: NEGATIVE
Ketones, ur: NEGATIVE mg/dL
Leukocytes,Ua: NEGATIVE
Nitrite: NEGATIVE
Protein, ur: NEGATIVE mg/dL
Specific Gravity, Urine: <1.005 — ABNORMAL LOW (ref 1.005–1.030)
pH: 7 (ref 5.0–8.0)

## 2022-03-05 LAB — ETHANOL: Alcohol, Ethyl (B): 14 mg/dL — ABNORMAL HIGH (ref <10)

## 2022-03-05 MED ORDER — THIAMINE HCL 100 MG/ML IJ SOLN
100.0000 mg | Freq: Once | INTRAMUSCULAR | Status: AC
  Administered 2022-03-05: 100 mg via INTRAVENOUS
  Filled 2022-03-05: qty 2

## 2022-03-05 MED ORDER — THIAMINE HCL 100 MG PO TABS: 100.0000 mg | ORAL_TABLET | Freq: Three times a day (TID) | ORAL | 0 refills | Status: DC

## 2022-03-05 MED ORDER — LACTATED RINGERS IV BOLUS
1000.0000 mL | Freq: Once | INTRAVENOUS | Status: AC
  Administered 2022-03-05: 1000 mL via INTRAVENOUS

## 2022-03-05 NOTE — ED Provider Notes (Incomplete)
[facility] EMERGENCY DEPT Provider Note   CSN: [id number] Arrival date & time: 03/04/22  2139     History {Add pertinent medical, surgical, social history, OB history to HPI:1} Chief Complaint  Patient presents with  . Hallucinations    Gregory Wheeler is a 81 y.o. male.  HPI Patient with mental status change.  Presents with son.  Reportedly more confused at home over the last few days.  Decreased oral intake.  Has not been eating and drinking much and only eating some bananas.  Reportedly had PCP tell him his potassium is low so he was eating more bananas.  Also reportedly drinking somewhat heavily.  Will drink a half a gallon of premixed margarita a day.  Not eating or drinking much otherwise.  Reportedly went to someone else's house today and was hallucinating about stuff around there.  Patient's son is worried he could have another urinary tract infection again.    Home Medications Prior to Admission medications   Medication Sig Start Date End Date Taking? Authorizing Provider  acetaminophen (TYLENOL) 325 MG tablet Take 650 mg by mouth 2 (two) times daily.    [provider]  allopurinol (ZYLOPRIM) 300 MG tablet Take 1 tablet (300 mg total) by mouth at bedtime. 03/23/21   [place], Peter M, MD  ALPRAZolam Prudy Feeler) 0.5 MG tablet Take 0.5-1 mg by mouth at bedtime.    [provider]  ascorbic acid (VITAMIN C) 1000 MG tablet Take 1,000 mg by mouth daily.    [provider]  famotidine (PEPCID) 40 MG tablet Take 40 mg by mouth daily as needed for heartburn or indigestion.    [provider]  folic acid (FOLVITE) 1 MG tablet Take 2 mg by mouth daily.  01/21/19   [provider]  metoprolol tartrate (LOPRESSOR) 25 MG tablet Take 0.5 tablets (12.5 mg total) by mouth 2 (two) times daily. 10/23/21 10/18/22  [place], Peter M, MD  pantoprazole (PROTONIX) 40 MG tablet Take 1 tablet (40 mg total) by mouth daily. 04/24/21 04/19/22  [place], Peter M,  MD  rivaroxaban (XARELTO) 20 MG TABS tablet Take 1 tablet (20 mg total) by mouth daily with supper. 02/19/22   [place], Peter M, MD      Allergies    Lorazepam    Review of Systems   Review of Systems  Physical Exam Updated Vital Signs BP (!) 165/76 (BP Location: Right Arm)   Pulse 76   Temp 97.8 F (36.6 C)   Resp 19   Ht 5\' 10"  (1.778 m)   Wt 108.9 kg   SpO2 94%   BMI 34.45 kg/m  Physical Exam Vitals and nursing note reviewed.  HENT:     Head: Normocephalic.  Eyes:     Extraocular Movements: Extraocular movements intact.  Cardiovascular:     Rate and Rhythm: Normal rate and regular rhythm.  Pulmonary:     Breath sounds: No rhonchi.  Abdominal:     Tenderness: There is no abdominal tenderness.  Musculoskeletal:        General: No tenderness.     Cervical back: Neck supple.  Skin:    Coloration: Skin is not jaundiced.  Neurological:     Mental Status: He is alert.     Comments: Awake and pleasant but some confusion.  States it was red lights going around the house that he was at.     ED Results / Procedures / Treatments   Labs (all labs ordered are listed, but only  abnormal results are displayed) Labs Reviewed  COMPREHENSIVE METABOLIC PANEL - Abnormal; Notable for the following components:      Result Value   Sodium 134 (*)    Chloride 93 (*)    BUN 7 (*)    Total Protein 6.0 (*)    Albumin 3.1 (*)    AST 57 (*)    All other components within normal limits  CBC - Abnormal; Notable for the following components:   WBC 12.7 (*)    RBC 3.42 (*)    Hemoglobin 12.9 (*)    HCT 37.8 (*)    MCV 110.5 (*)    MCH 37.7 (*)    All other components within normal limits  URINALYSIS, ROUTINE W REFLEX MICROSCOPIC  ETHANOL  CBG MONITORING, ED    EKG None  Radiology No results found.  Procedures Procedures  {Document cardiac monitor, telemetry assessment procedure when appropriate:1}  Medications Ordered in ED Medications - No data to display  ED Course/  Medical Decision Making/ A&P                           Medical Decision Making Amount and/or Complexity of Data Reviewed Labs: ordered.   Patient with mental status change.  Had been drinking alcohol somewhat heavily.  Alcohol level still pending but white count mildly elevated and see CMP overall rather reassuring.  Mild hyponatremia.  AST mildly elevated but only at 60.  {Document critical care time when appropriate:1} {Document review of labs and clinical decision tools ie heart score, Chads2Vasc2 etc:1}  {Document your independent review of radiology images, and any outside records:1} {Document your discussion with family members, caretakers, and with consultants:1} {Document social determinants of health affecting pt's care:1} {Document your decision making why or why not admission, treatments were needed:1} Final Clinical Impression(s) / ED Diagnoses Final diagnoses:  None    Rx / DC Orders ED Discharge Orders     None

## 2022-03-05 NOTE — ED Provider Notes (Signed)
Patient signed out pending imaging and urinalysis.  In brief presents with worsening hallucinations.  Is a daily drinker.  Has had alcohol withdrawal in the past.  CT scan of the head is negative.  Urinalysis is not consistent with UTI.  He does have evidence of dehydration with hemoconcentration on his lab work.  Had a long discussion with the patient and his family.  Suspect that alcohol may be contributing to his hallucinations.  Also would consider Wernicke's encephalopathy; however, patient is awake, alert, and oriented at this time.  I have offered admission for MRI and IV thiamine treatment as well as alcohol withdrawal support.  Patient and his family both declined.  While oral thiamine therapy is suboptimal, will start on thiamine daily.  Ambulatory referral sent to neurology for outpatient evaluation and possible MRI.  Patient was advised of the dangers of both ongoing alcohol use as well as stopping alcohol abruptly with concerns for withdrawal or death.  He stated understanding.   Physical Exam  BP (!) 161/77   Pulse 85   Temp 97.8 F (36.6 C)   Resp 19   Ht 1.778 m (5\' 10" )   Wt 108.9 kg   SpO2 91%   BMI 34.45 kg/m   Physical Exam Awake, alert, no acute distress  Procedures  Procedures  ED Course / MDM    Medical Decision Making Amount and/or Complexity of Data Reviewed Labs: ordered. Radiology: ordered.  Risk OTC drugs. Prescription drug management.   Problem List Items Addressed This Visit       Other   Dehydration   Other Visit Diagnoses     Alcohol use disorder    -  Primary   Hallucinations                 Gregory Wheeler, , MD 03/05/22 [phone]

## 2022-03-05 NOTE — Discharge Instructions (Signed)
You were seen today for altered mental status.  This is likely multifactorial but could be related to your alcohol use.  You need to follow-up with neurology may need further imaging.  A thiamine level is pending.  This can be very low in alcoholics and because Wernicke's encephalopathy.  You declined admission for thiamine therapy; however, if this comes back low, I will call you as you may need IV thiamine.  In the meantime, you should not quit alcohol cold [place] and should be under medical management if you decide to quit.  Make sure that you are staying hydrated.

## 2022-03-05 NOTE — ED Notes (Signed)
Pt son/caregiver agreeable with d/c plan as discussed by provider- this nurse has verbally reinforced d/c instructions and provided caregiver with written copy- no additional questions, concerns, needs verbalized; pt escorted to exit via w/c by this nurse and family members -- daughter is driver.

## 2022-03-05 NOTE — ED Notes (Signed)
Patient transported to CT via stretcher.

## 2022-03-08 LAB — VITAMIN B1: Vitamin B1 (Thiamine): 76.3 nmol/L (ref 66.5–200.0)

## 2022-03-23 NOTE — Progress Notes (Deleted)
[name] Date of Birth: May 02, 1941   History of Present Illness: Mr. Drum is seen for follow up of CAD and atrial fibrillation.  He has a history of coronary disease and is status post stenting of the LAD with overlapping Taxus stents in 2009. Myoview in April 2015 showed mild apical thinning, normal EF 67%.  He  was seen in the ED in February 2016 for symptoms of left arm numbness and pain. This was worse with lifting his arm. Ecg was done and demonstrated tachycardia with no clear P waves and rate 113.   He was seen in June 2016 symptoms of fatigue and acute gout flair in his right knee bursa. He was noted to be in atrial fibrillation with RVR (and review of ECG from February showed the same). He had been drinking Etoh heavily. He was started on metoprolol and Xarelto.   He was scheduled for a DCCV on 03/03/15 but on arrival he was in NSR with rate 77.   He was seen in the ED on 06/20/18 with abdominal pain and had an umbilical hernia reduced.   He was seen in June 2020  with symptoms of increased DOE and fatigue. Was noted to be anemic with Hgb 9. Low folate. Normal BNP. Echo was normal. Myoview was low risk without significant ischemia and unchanged from prior studies. He was started on iron therapy. GI evaluation was warranted but he did not pursue. Hgb did improve to 11.6. He is also B12 deficient and on shots.   He was admitted in November with acute encephalopathy. Had tested positive for Covid 19 10 days before. He had respiratory failure and required intubation. He had enterococcal bacteremia treated with 6 weeks of IV antibiotics. He had GI bleeding with EGD showing some gastritis. He had been drinking Etoh heavily and had DTs. He remained significantly deconditioned. Palliative care was consulted and he was a DNR. Echo showed normal EF. No significant valvular abnormality. Moderate RV dysfunction. He had third spacing of fluid with edema. Persistent dysphagia due to deconditioning.  He had urinary retention that later resolved. When seen in follow up with PCP he was persistently hypotensive and amlodipine was discontinued.   He was seen in the ED 03/04/22 with hallucinations. Labs and head CT unrevealing. 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   Seen with his daughter in law today.  He is living by himself and able to do his ADLs. He is having more problems with back problems and had a nerve stimulator placed. Still having a lot of pain between his shoulders. Gets around with a walker. Gets SOB with walking. No chest pain, edema  or palpitations. Last week had a spell where he almost passed out. EMS called. Noted low BP < 100. He has not taken metoprolol since then.   Current Outpatient Medications on File Prior to Visit  Medication Sig Dispense Refill   acetaminophen (TYLENOL) 325 MG tablet Take 650 mg by mouth 2 (two) times daily.     allopurinol (ZYLOPRIM) 300 MG tablet Take 1 tablet (300 mg total) by mouth at bedtime. 90 tablet 3   ALPRAZolam (XANAX) 0.5 MG tablet Take 0.5-1 mg by mouth at bedtime.     ascorbic acid (VITAMIN C) 1000 MG tablet Take 1,000 mg by mouth daily.     famotidine (PEPCID) 40 MG tablet Take 40 mg by mouth daily as needed for heartburn or indigestion.     folic acid (FOLVITE) 1 MG tablet Take  2 mg by mouth daily.      metoprolol tartrate (LOPRESSOR) 25 MG tablet Take 0.5 tablets (12.5 mg total) by mouth 2 (two) times daily. 90 tablet 3   pantoprazole (PROTONIX) 40 MG tablet Take 1 tablet (40 mg total) by mouth daily. 90 tablet 3   rivaroxaban (XARELTO) 20 MG TABS tablet Take 1 tablet (20 mg total) by mouth daily with supper. 90 tablet 3   thiamine 100 MG tablet Take 1 tablet (100 mg total) by mouth 3 (three) times daily. 30 tablet 0   No current facility-administered medications on file prior to visit.    Allergies  Allergen Reactions   Lorazepam Other (See Comments)    Extreme delirium     Past Medical History:  Diagnosis Date    Arthritis    Atrial fibrillation (HCC) 01/31/2015   Chronic gout    Coronary artery disease 12/2007   STATUS POST STENTING OF THE LEFT ANTERIOR DESCENDING ARTERY   COVID-19    Dyslipidemia    Gout    Hiatal hernia    Hyperlipidemia    Hypertension    LBP (low back pain)    Partial small bowel obstruction (HCC)    RBBB (right bundle branch block)    SBO (small bowel obstruction) (HCC)    PARTIAL   Second degree AV block, Mobitz type I    Urinary tract infection    Escherichia coli urinary tract infection    Past Surgical History:  Procedure Laterality Date   CARDIAC CATHETERIZATION     ESOPHAGOGASTRODUODENOSCOPY N/A 06/30/2020   Procedure: ESOPHAGOGASTRODUODENOSCOPY (EGD);  Surgeon: Vida Rigger, MD;  Location: [facility] ENDOSCOPY;  Service: Endoscopy;  Laterality: N/A;   HERNIA REPAIR     INGUINAL   ROTATOR CUFF REPAIR      Social History   Tobacco Use  Smoking Status Former   Types: Cigarettes   Quit date: 08/27/1990   Years since quitting: 31.5  Smokeless Tobacco Never    Social History   Substance and Sexual Activity  Alcohol Use Yes   Comment: near daily    Family History  Problem Relation Age of Onset   Emphysema Mother 14   Heart attack Father 79    Review of Systems: As noted in HPI All other systems were reviewed and are negative.  Physical Exam: There were no vitals taken for this visit. GENERAL:  Overweight WM in NAD HEENT:  PERRL, EOMI, sclera are clear. Oropharynx is clear. NECK:  No jugular venous distention, carotid upstroke brisk and symmetric, no bruits, no thyromegaly or adenopathy LUNGS:  Clear to auscultation bilaterally CHEST:  Unremarkable HEART:  RRR,  PMI not displaced or sustained,S1 and S2 within normal limits, no S3, no S4: no clicks, no rubs, no murmurs ABD:  Soft, nontender. BS +, no masses or bruits. No hepatomegaly, no splenomegaly EXT:  2 + pulses throughout, no edema, no cyanosis no clubbing SKIN:  Warm and dry.  No rashes NEURO:   Alert and oriented x 3. Cranial nerves II through XII intact. PSYCH:  Cognitively intact   LABORATORY DATA: Lab Results  Component Value Date   WBC 12.7 (H) 03/04/2022   HGB 12.9 (L) 03/04/2022   HCT 37.8 (L) 03/04/2022   PLT 208 03/04/2022   GLUCOSE 84 03/04/2022   CHOL 176 06/10/2017   TRIG 100 07/05/2020   HDL 66 06/10/2017   LDLCALC 85 06/10/2017   ALT 26 03/04/2022   AST 57 (H) 03/04/2022   NA 134 (L) 03/04/2022  K 3.5 03/04/2022   CL 93 (L) 03/04/2022   CREATININE 0.86 03/04/2022   BUN 7 (L) 03/04/2022   CO2 28 03/04/2022   TSH 4.300 07/01/2018   INR 1.4 04/24/2016   HGBA1C 5.9 (H) 06/29/2020   Dated 12/30/18: cholesterol 129, triglycerides 97, HDL 47, LDL 85. Normal TSH. Dated 03/27/19: Hgb 11.3. ferritin 17. B12 175,  Dated 11/24/19: Hgb 9.8. albumin 3.2. otherwise CMET normal. B12 and folate normal. Iron 36 with sat 11%. Ferritin 15.  Dated 10/06/21: sodium 130, creatinine 1.96. otherwise CMET normal. TSH normal. Hgb 9.1. iron 23 with sat 5%. TIBC normal.  Ecg today shows rate 109. There is considerable electrical artifact due to his nerve stimulator. Unable to tell if this is sinus but it is regular. RBBB. LAD. I have personally reviewed and interpreted this study.   Echo: 02/09/15  Study Conclusions  - Left ventricle: The cavity size was normal. There was mild focal   basal hypertrophy of the septum. Systolic function was normal.   The estimated ejection fraction was in the range of 60% to 65%.   Wall motion was normal; there were no regional wall motion   abnormalities. - Aortic valve: Trileaflet; normal thickness, mildly calcified   leaflets. - Mitral valve: Calcified annulus. - Pulmonic valve: There was trivial regurgitation.  Echo 02/17/19: IMPRESSIONS      1. The left ventricle has normal systolic function, with an ejection fraction of 55-60%. The cavity size was normal. There is mildly increased left ventricular wall thickness. Left ventricular diastolic  Doppler parameters are consistent with impaired  relaxation. Indeterminate filling pressures The E/e' is 8-15. No evidence of left ventricular regional wall motion abnormalities.  2. The right ventricle has normal systolic function. The cavity was normal. There is no increase in right ventricular wall thickness.  3. The mitral valve is grossly normal.  4. The tricuspid valve is grossly normal.  5. The aortic valve is abnormal. Mild sclerosis of the aortic valve. Aortic valve regurgitation is mild by color flow Doppler. No stenosis of the aortic valve.  6. 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  7. When compared to the prior study: 02/09/15 EF 60-65%.  Myoview 02/12/19: Study Highlights    The left ventricular ejection fraction is normal (55-65%). Nuclear stress EF: 60%. There was no ST segment deviation noted during stress. Defect 1: There is a medium defect of moderate severity present in the basal inferolateral, mid inferolateral and apex location. This is a low risk study.   Normal, low risk stress nuclear study with prominent inferior and apical thinning but no ischemia.  Gated ejection fraction 60% with normal wall motion.    Echo 06/23/20: IMPRESSIONS     1. Left ventricular ejection fraction, by estimation, is 60 to 65%. The  left ventricle has normal function. The left ventricle has no regional  wall motion abnormalities. There is mild left ventricular hypertrophy.  Left ventricular diastolic parameters  are consistent with Grade I diastolic dysfunction (impaired relaxation).   2. Right ventricular systolic function is moderately reduced. The right  ventricular size is normal. There is normal pulmonary artery systolic  pressure. The estimated right ventricular systolic pressure is [id number] mmHg.  Abnormal RV, would consider PE.   3. Right atrial size was mildly dilated.   4. The mitral valve is normal in structure. No evidence of mitral valve   regurgitation. No evidence of mitral stenosis.   5. The aortic valve is tricuspid. Aortic  valve regurgitation is trivial.  Mild aortic valve sclerosis is present, with no evidence of aortic valve  stenosis.   6. The inferior vena cava is normal in size with greater than 50%  respiratory variability, suggesting right atrial pressure of 3 mmHg.   Assessment / Plan: 1. Coronary disease status post stenting of the LAD in 2009 with Taxus stents. Nuclear stress test in June 2020 was low risk and unchanged from prior. Echo normal. He has no significant angina.  Continue with risk factor modification. No Antiplatelet therapy now that he is on Xarelto.   2. Atrial fibrillation- paroxysmal. Difficult to tell rhythm today due to nerve stimulator. Rate is faster but this may be due to stopping his metoprolol. Will resume metoprolol at 12.5 mg bid.   He is asymptomatic. [place] vasc score is 3.  Avoid NSAIDs. Now on Xarelto.   3. Hyperlipidemia   4. History of Mobitz type 1 second degree AV block. RBBB. If he has recurrent syncope will need to have him wear a monitor.   5. HTN   controlled.   6. Gout. Asymptomatic.  7. Etoh abuse. Recommend total abstinence.   8. Obesity.   9. Peripheral neuropathy.   10. Anemia. With iron deficiency. Intolerant to oral iron. Follow up with primary care.    I will follow up in 6 months.

## 2022-03-26 ENCOUNTER — Telehealth: Payer: Self-pay | Admitting: Cardiology

## 2022-03-26 NOTE — Telephone Encounter (Signed)
Pt spouse called wanting to speak with Anabel Halon

## 2022-03-26 NOTE — Telephone Encounter (Addendum)
Son reports that was taken to the ED yesterday for confusion. Patient admitted at [facility] for PNA both lungs. Rescheduled appointment with Dr. [place] for 04/03/22

## 2022-03-27 ENCOUNTER — Other Ambulatory Visit: Payer: Self-pay | Admitting: Cardiology

## 2022-03-28 ENCOUNTER — Ambulatory Visit: Payer: Medicare Other | Admitting: Cardiology

## 2022-04-01 NOTE — Progress Notes (Deleted)
Gregory Wheeler Date of Birth: May 02, 1941   History of Present Illness: Gregory Wheeler is seen for follow up of CAD and atrial fibrillation.  He has a history of coronary disease and is status post stenting of the LAD with overlapping Taxus stents in 2009. Myoview in April 2015 showed mild apical thinning, normal EF 67%.  He  was seen in the ED in February 2016 for symptoms of left arm numbness and pain. This was worse with lifting his arm. Ecg was done and demonstrated tachycardia with no clear P waves and rate 113.   He was seen in June 2016 symptoms of fatigue and acute gout flair in his right knee bursa. He was noted to be in atrial fibrillation with RVR (and review of ECG from February showed the same). He had been drinking Etoh heavily. He was started on metoprolol and Xarelto.   He was scheduled for a DCCV on 03/03/15 but on arrival he was in NSR with rate 77.   He was seen in the ED on 06/20/18 with abdominal pain and had an umbilical hernia reduced.   He was seen in June 2020  with symptoms of increased DOE and fatigue. Was noted to be anemic with Hgb 9. Low folate. Normal BNP. Echo was normal. Myoview was low risk without significant ischemia and unchanged from prior studies. He was started on iron therapy. GI evaluation was warranted but he did not pursue. Hgb did improve to 11.6. He is also B12 deficient and on shots.   He was admitted in November with acute encephalopathy. Had tested positive for Covid 19 10 days before. He had respiratory failure and required intubation. He had enterococcal bacteremia treated with 6 weeks of IV antibiotics. He had GI bleeding with EGD showing some gastritis. He had been drinking Etoh heavily and had DTs. He remained significantly deconditioned. Palliative care was consulted and he was a DNR. Echo showed normal EF. No significant valvular abnormality. Moderate RV dysfunction. He had third spacing of fluid with edema. Persistent dysphagia due to deconditioning.  He had urinary retention that later resolved. When seen in follow up with PCP he was persistently hypotensive and amlodipine was discontinued.   He was seen in the ED 03/04/22 with hallucinations. Labs and head CT unrevealing. 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   Seen with his daughter in law today.  He is living by himself and able to do his ADLs. He is having more problems with back problems and had a nerve stimulator placed. Still having a lot of pain between his shoulders. Gets around with a walker. Gets SOB with walking. No chest pain, edema  or palpitations. Last week had a spell where he almost passed out. EMS called. Noted low BP < 100. He has not taken metoprolol since then.   Current Outpatient Medications on File Prior to Visit  Medication Sig Dispense Refill   acetaminophen (TYLENOL) 325 MG tablet Take 650 mg by mouth 2 (two) times daily.     allopurinol (ZYLOPRIM) 300 MG tablet Take 1 tablet (300 mg total) by mouth at bedtime. 90 tablet 3   ALPRAZolam (XANAX) 0.5 MG tablet Take 0.5-1 mg by mouth at bedtime.     ascorbic acid (VITAMIN C) 1000 MG tablet Take 1,000 mg by mouth daily.     famotidine (PEPCID) 40 MG tablet Take 40 mg by mouth daily as needed for heartburn or indigestion.     folic acid (FOLVITE) 1 MG tablet Take  2 mg by mouth daily.      metoprolol tartrate (LOPRESSOR) 25 MG tablet Take 0.5 tablets (12.5 mg total) by mouth 2 (two) times daily. 90 tablet 3   pantoprazole (PROTONIX) 40 MG tablet TAKE 1 TABLET(40 MG) BY MOUTH DAILY 90 tablet 3   rivaroxaban (XARELTO) 20 MG TABS tablet Take 1 tablet (20 mg total) by mouth daily with supper. 90 tablet 3   thiamine 100 MG tablet Take 1 tablet (100 mg total) by mouth 3 (three) times daily. 30 tablet 0   No current facility-administered medications on file prior to visit.    Allergies  Allergen Reactions   Lorazepam Other (See Comments)    Extreme delirium     Past Medical History:  Diagnosis Date   Arthritis     Atrial fibrillation (HCC) 01/31/2015   Chronic gout    Coronary artery disease 12/2007   STATUS POST STENTING OF THE LEFT ANTERIOR DESCENDING ARTERY   COVID-19    Dyslipidemia    Gout    Hiatal hernia    Hyperlipidemia    Hypertension    LBP (low back pain)    Partial small bowel obstruction (HCC)    RBBB (right bundle branch block)    SBO (small bowel obstruction) (HCC)    PARTIAL   Second degree AV block, Mobitz type I    Urinary tract infection    Escherichia coli urinary tract infection    Past Surgical History:  Procedure Laterality Date   CARDIAC CATHETERIZATION     ESOPHAGOGASTRODUODENOSCOPY N/A 06/30/2020   Procedure: ESOPHAGOGASTRODUODENOSCOPY (EGD);  Surgeon: Vida Rigger, MD;  Location: [facility] ENDOSCOPY;  Service: Endoscopy;  Laterality: N/A;   HERNIA REPAIR     INGUINAL   ROTATOR CUFF REPAIR      Social History   Tobacco Use  Smoking Status Former   Types: Cigarettes   Quit date: 08/27/1990   Years since quitting: 31.6  Smokeless Tobacco Never    Social History   Substance and Sexual Activity  Alcohol Use Yes   Comment: near daily    Family History  Problem Relation Age of Onset   Emphysema Mother 47   Heart attack Father 87    Review of Systems: As noted in HPI All other systems were reviewed and are negative.  Physical Exam: There were no vitals taken for this visit. GENERAL:  Overweight WM in NAD HEENT:  PERRL, EOMI, sclera are clear. Oropharynx is clear. NECK:  No jugular venous distention, carotid upstroke brisk and symmetric, no bruits, no thyromegaly or adenopathy LUNGS:  Clear to auscultation bilaterally CHEST:  Unremarkable HEART:  RRR,  PMI not displaced or sustained,S1 and S2 within normal limits, no S3, no S4: no clicks, no rubs, no murmurs ABD:  Soft, nontender. BS +, no masses or bruits. No hepatomegaly, no splenomegaly EXT:  2 + pulses throughout, no edema, no cyanosis no clubbing SKIN:  Warm and dry.  No rashes NEURO:  Alert and  oriented x 3. Cranial nerves II through XII intact. PSYCH:  Cognitively intact   LABORATORY DATA: Lab Results  Component Value Date   WBC 12.7 (H) 03/04/2022   HGB 12.9 (L) 03/04/2022   HCT 37.8 (L) 03/04/2022   PLT 208 03/04/2022   GLUCOSE 84 03/04/2022   CHOL 176 06/10/2017   TRIG 100 07/05/2020   HDL 66 06/10/2017   LDLCALC 85 06/10/2017   ALT 26 03/04/2022   AST 57 (H) 03/04/2022   NA 134 (L) 03/04/2022  K 3.5 03/04/2022   CL 93 (L) 03/04/2022   CREATININE 0.86 03/04/2022   BUN 7 (L) 03/04/2022   CO2 28 03/04/2022   TSH 4.300 07/01/2018   INR 1.4 04/24/2016   HGBA1C 5.9 (H) 06/29/2020   Dated 12/30/18: cholesterol 129, triglycerides 97, HDL 47, LDL 85. Normal TSH. Dated 03/27/19: Hgb 11.3. ferritin 17. B12 175,  Dated 11/24/19: Hgb 9.8. albumin 3.2. otherwise CMET normal. B12 and folate normal. Iron 36 with sat 11%. Ferritin 15.  Dated 10/06/21: sodium 130, creatinine 1.96. otherwise CMET normal. TSH normal. Hgb 9.1. iron 23 with sat 5%. TIBC normal.  Ecg today shows rate 109. There is considerable electrical artifact due to his nerve stimulator. Unable to tell if this is sinus but it is regular. RBBB. LAD. I have personally reviewed and interpreted this study.   Echo: 02/09/15  Study Conclusions  - Left ventricle: The cavity size was normal. There was mild focal   basal hypertrophy of the septum. Systolic function was normal.   The estimated ejection fraction was in the range of 60% to 65%.   Wall motion was normal; there were no regional wall motion   abnormalities. - Aortic valve: Trileaflet; normal thickness, mildly calcified   leaflets. - Mitral valve: Calcified annulus. - Pulmonic valve: There was trivial regurgitation.  Echo 02/17/19: IMPRESSIONS      1. The left ventricle has normal systolic function, with an ejection fraction of 55-60%. The cavity size was normal. There is mildly increased left ventricular wall thickness. Left ventricular diastolic Doppler  parameters are consistent with impaired  relaxation. Indeterminate filling pressures The E/e' is 8-15. No evidence of left ventricular regional wall motion abnormalities.  2. The right ventricle has normal systolic function. The cavity was normal. There is no increase in right ventricular wall thickness.  3. The mitral valve is grossly normal.  4. The tricuspid valve is grossly normal.  5. The aortic valve is abnormal. Mild sclerosis of the aortic valve. Aortic valve regurgitation is mild by color flow Doppler. No stenosis of the aortic valve.  6. 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  7. When compared to the prior study: 02/09/15 EF 60-65%.  Myoview 02/12/19: Study Highlights    The left ventricular ejection fraction is normal (55-65%). Nuclear stress EF: 60%. There was no ST segment deviation noted during stress. Defect 1: There is a medium defect of moderate severity present in the basal inferolateral, mid inferolateral and apex location. This is a low risk study.   Normal, low risk stress nuclear study with prominent inferior and apical thinning but no ischemia.  Gated ejection fraction 60% with normal wall motion.    Echo 06/23/20: IMPRESSIONS     1. Left ventricular ejection fraction, by estimation, is 60 to 65%. The  left ventricle has normal function. The left ventricle has no regional  wall motion abnormalities. There is mild left ventricular hypertrophy.  Left ventricular diastolic parameters  are consistent with Grade I diastolic dysfunction (impaired relaxation).   2. Right ventricular systolic function is moderately reduced. The right  ventricular size is normal. There is normal pulmonary artery systolic  pressure. The estimated right ventricular systolic pressure is 33.2 mmHg.  Abnormal RV, would consider PE.   3. Right atrial size was mildly dilated.   4. The mitral valve is normal in structure. No evidence of mitral valve  regurgitation. No  evidence of mitral stenosis.   5. The aortic valve is tricuspid. Aortic  valve regurgitation is trivial.  Mild aortic valve sclerosis is present, with no evidence of aortic valve  stenosis.   6. The inferior vena cava is normal in size with greater than 50%  respiratory variability, suggesting right atrial pressure of 3 mmHg.   Assessment / Plan: 1. Coronary disease status post stenting of the LAD in 2009 with Taxus stents. Nuclear stress test in June 2020 was low risk and unchanged from prior. Echo normal. He has no significant angina.  Continue with risk factor modification. No Antiplatelet therapy now that he is on Xarelto.   2. Atrial fibrillation- paroxysmal. Difficult to tell rhythm today due to nerve stimulator. Rate is faster but this may be due to stopping his metoprolol. Will resume metoprolol at 12.5 mg bid.   He is asymptomatic. [place] vasc score is 3.  Avoid NSAIDs. Now on Xarelto.   3. Hyperlipidemia   4. History of Mobitz type 1 second degree AV block. RBBB. If he has recurrent syncope will need to have him wear a monitor.   5. HTN   controlled.   6. Gout. Asymptomatic.  7. Etoh abuse. Recommend total abstinence.   8. Obesity.   9. Peripheral neuropathy.   10. Anemia. With iron deficiency. Intolerant to oral iron. Follow up with primary care.    I will follow up in 6 months.

## 2022-04-03 ENCOUNTER — Ambulatory Visit: Payer: Medicare Other | Admitting: Cardiology

## 2022-05-13 ENCOUNTER — Emergency Department (HOSPITAL_COMMUNITY): Payer: Medicare Other

## 2022-05-13 ENCOUNTER — Encounter (HOSPITAL_COMMUNITY): Payer: Self-pay

## 2022-05-13 ENCOUNTER — Emergency Department (HOSPITAL_COMMUNITY)
Admission: EM | Admit: 2022-05-13 | Discharge: 2022-05-13 | Disposition: A | Discharge status: Home / Self Care | Payer: Medicare Other | Attending: Emergency Medicine | Admitting: Emergency Medicine

## 2022-05-13 DIAGNOSIS — Z7901 Long term (current) use of anticoagulants: Secondary | ICD-10-CM | POA: Diagnosis not present

## 2022-05-13 DIAGNOSIS — Z23 Encounter for immunization: Secondary | ICD-10-CM | POA: Insufficient documentation

## 2022-05-13 DIAGNOSIS — E860 Dehydration: Secondary | ICD-10-CM | POA: Insufficient documentation

## 2022-05-13 DIAGNOSIS — W010XXA Fall on same level from slipping, tripping and stumbling without subsequent striking against object, initial encounter: Secondary | ICD-10-CM | POA: Insufficient documentation

## 2022-05-13 DIAGNOSIS — S0093XA Contusion of unspecified part of head, initial encounter: Secondary | ICD-10-CM | POA: Diagnosis not present

## 2022-05-13 DIAGNOSIS — S0990XA Unspecified injury of head, initial encounter: Secondary | ICD-10-CM | POA: Diagnosis present

## 2022-05-13 DIAGNOSIS — Z79899 Other long term (current) drug therapy: Secondary | ICD-10-CM | POA: Diagnosis not present

## 2022-05-13 DIAGNOSIS — Z20822 Contact with and (suspected) exposure to covid-19: Secondary | ICD-10-CM | POA: Insufficient documentation

## 2022-05-13 DIAGNOSIS — I10 Essential (primary) hypertension: Secondary | ICD-10-CM | POA: Diagnosis not present

## 2022-05-13 DIAGNOSIS — R531 Weakness: Secondary | ICD-10-CM

## 2022-05-13 LAB — URINALYSIS, ROUTINE W REFLEX MICROSCOPIC
Bilirubin Urine: NEGATIVE
Glucose, UA: NEGATIVE mg/dL
Hgb urine dipstick: NEGATIVE
Ketones, ur: NEGATIVE mg/dL
Leukocytes,Ua: NEGATIVE
Nitrite: NEGATIVE
Protein, ur: NEGATIVE mg/dL
Specific Gravity, Urine: 1.005 (ref 1.005–1.030)
pH: 7 (ref 5.0–8.0)

## 2022-05-13 LAB — SARS CORONAVIRUS 2 BY RT PCR: SARS Coronavirus 2 by RT PCR: NEGATIVE

## 2022-05-13 LAB — CBC
HCT: 35.1 % — ABNORMAL LOW (ref 39.0–52.0)
Hemoglobin: 11.5 g/dL — ABNORMAL LOW (ref 13.0–17.0)
MCH: 34.7 pg — ABNORMAL HIGH (ref 26.0–34.0)
MCHC: 32.8 g/dL (ref 30.0–36.0)
MCV: 106 fL — ABNORMAL HIGH (ref 80.0–100.0)
Platelets: 247 10*3/uL (ref 150–400)
RBC: 3.31 MIL/uL — ABNORMAL LOW (ref 4.22–5.81)
RDW: 13.2 % (ref 11.5–15.5)
WBC: 7.6 10*3/uL (ref 4.0–10.5)
nRBC: 0 % (ref 0.0–0.2)

## 2022-05-13 LAB — COMPREHENSIVE METABOLIC PANEL
ALT: 13 U/L (ref 0–44)
AST: 25 U/L (ref 15–41)
Albumin: 2.9 g/dL — ABNORMAL LOW (ref 3.5–5.0)
Alkaline Phosphatase: 150 U/L — ABNORMAL HIGH (ref 38–126)
Anion gap: 12 (ref 5–15)
BUN: 13 mg/dL (ref 8–23)
CO2: 19 mmol/L — ABNORMAL LOW (ref 22–32)
Calcium: 9.3 mg/dL (ref 8.9–10.3)
Chloride: 101 mmol/L (ref 98–111)
Creatinine, Ser: 1.27 mg/dL — ABNORMAL HIGH (ref 0.61–1.24)
GFR, Estimated: 57 mL/min — ABNORMAL LOW (ref >60)
Glucose, Bld: 90 mg/dL (ref 70–99)
Potassium: 4.4 mmol/L (ref 3.5–5.1)
Sodium: 132 mmol/L — ABNORMAL LOW (ref 135–145)
Total Bilirubin: 1 mg/dL (ref 0.3–1.2)
Total Protein: 5.9 g/dL — ABNORMAL LOW (ref 6.5–8.1)

## 2022-05-13 LAB — CK: Total CK: 52 U/L (ref 49–397)

## 2022-05-13 LAB — MAGNESIUM: Magnesium: 1.1 mg/dL — ABNORMAL LOW (ref 1.7–2.4)

## 2022-05-13 MED ORDER — SODIUM CHLORIDE 0.9 % IV BOLUS
1000.0000 mL | Freq: Once | INTRAVENOUS | Status: AC
  Administered 2022-05-13: 1000 mL via INTRAVENOUS

## 2022-05-13 MED ORDER — TETANUS-DIPHTH-ACELL PERTUSSIS 5-2.5-18.5 LF-MCG/0.5 IM SUSY
0.5000 mL | PREFILLED_SYRINGE | Freq: Once | INTRAMUSCULAR | Status: AC
  Administered 2022-05-13: 0.5 mL via INTRAMUSCULAR
  Filled 2022-05-13: qty 0.5

## 2022-05-13 MED ORDER — MAGNESIUM SULFATE 2 GM/50ML IV SOLN
2.0000 g | Freq: Once | INTRAVENOUS | Status: AC
  Administered 2022-05-13: 2 g via INTRAVENOUS
  Filled 2022-05-13: qty 50

## 2022-05-13 MED ORDER — ACETAMINOPHEN 500 MG PO TABS
1000.0000 mg | ORAL_TABLET | Freq: Once | ORAL | Status: AC
  Administered 2022-05-13: 1000 mg via ORAL
  Filled 2022-05-13: qty 2

## 2022-05-13 NOTE — Discharge Instructions (Addendum)
It was our pleasure to provide your ER care today - we hope that you feel better.  Drink plenty of fluids/stay well hydrated. Eat balanced diet. Take a multi-vitamin a day.   Avoid taking extra laxative (or extra of any medication).   Fall precautions - use great care and/or walker to help minimize risk of falling.  From today's labs, your magnesium level is low - take your supplement as prescribed and follow up with your doctor.  Return to ER if worse, new symptoms, fevers, new/severe pain, chest pain, trouble breathing, or other concern.

## 2022-05-13 NOTE — ED Notes (Signed)
Pt provided ice water 
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
## 2022-05-13 NOTE — ED Notes (Signed)
Patient transported to X-ray 
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
## 2022-08-16 ENCOUNTER — Other Ambulatory Visit: Payer: Self-pay

## 2022-08-16 DIAGNOSIS — I4819 Other persistent atrial fibrillation: Secondary | ICD-10-CM

## 2022-08-16 MED ORDER — RIVAROXABAN 20 MG PO TABS: 20.0000 mg | ORAL_TABLET | Freq: Every day | ORAL | 3 refills | Status: DC

## 2022-08-16 NOTE — Telephone Encounter (Signed)
Prescription refill request for Xarelto received.  Indication: Afib  Last office visit: 10/23/21 ([place])  Weight: 108.9kg Age: 81 Scr: 1.27 (05/13/22)  CrCl: 71.34ml/min  Appropriate dose and refill sent to requested pharmacy.

## 2022-09-10 NOTE — Progress Notes (Deleted)
Cardiology Clinic Note   Patient Name: JAYLEND SCHIERER Date of Encounter: 09/10/2022  Primary Care Provider:  [name], NP Primary Cardiologist:  Peter Martinique, MD  Patient Profile    SHANTA KNEECE 82 year old male presents to the clinic today for follow-up evaluation of his coronary artery disease, hypertension, and atrial fibrillation.  Past Medical History    Past Medical History:  Diagnosis Date   Arthritis    Atrial fibrillation ([place]) 01/31/2015   Chronic gout    Coronary artery disease 12/2007   STATUS POST STENTING OF THE LEFT ANTERIOR DESCENDING ARTERY   COVID-19    Dyslipidemia    Gout    Hiatal hernia    Hyperlipidemia    Hypertension    LBP (low back pain)    Partial small bowel obstruction (HCC)    RBBB (right bundle branch block)    SBO (small bowel obstruction) (HCC)    PARTIAL   Second degree AV block, Mobitz type I    Urinary tract infection    Escherichia coli urinary tract infection   Past Surgical History:  Procedure Laterality Date   CARDIAC CATHETERIZATION     ESOPHAGOGASTRODUODENOSCOPY N/A 06/30/2020   Procedure: ESOPHAGOGASTRODUODENOSCOPY (EGD);  Surgeon: Clarene Essex, MD;  Location: [facility];  Service: Endoscopy;  Laterality: N/A;   HERNIA REPAIR     INGUINAL   ROTATOR CUFF REPAIR      Allergies  Allergies  Allergen Reactions   Lorazepam Other (See Comments)    Extreme delirium    Oxycodone Other (See Comments)    confusion    History of Present Illness    KAIREE AMICI is a PMH of artery disease, hypertension, RBBB, second-degree AV block Mobitz type I, atrial fibrillation hypotension respiratory arrest, small bowel obstruction, acute encephalopathy, arthritis, AKI, blood clotting disorder, fatigue, dehydration, hypokalemia, palliative care encounter, and COVID-19 infection.  He is status post stenting of his LAD with overlapping stents in 2009.  He underwent nuclear stress testing 4/15 which showed mild apical thinning,  normal EF of 67%.  He presented to the emergency department 2/16 with left arm numbness and pain.  This was worse with lifting his arm.  His EKG showed tachycardia and no clear P waves changes with a rate of 113.  He was admitted to the hospital 11/22 with acute encephalopathy.  He had tested positive for COVID-19.  He had respiratory failure and required intubation.  He was noted to have enterococcal bacteremia which required IV antibiotics.  He underwent EGD for GI bleed which showed gastritis.  He had been drinking EtOH heavily and had DTs.  He remained deconditioned.  Palliative care was consulted and he was made a DNR.  His echocardiogram showed normal EF.  No significant valvular abnormalities were noted.  He developed urinary retention which later resolved.  He was seen in follow-up by his PCP and was noted to be hypotensive.  His amlodipine was discontinued.  He followed up with Dr. Martinique 10/23/2021 with his daughter-in-law.  He was living by himself and was able to do his ADLs.  He did note he was having back pain and had a nerve stimulator placed.  He was also having a lot of pain between shoulders.  He was getting around with a walker.  He did note some shortness of breath with walking.  He denied chest pain, lower extremity swelling and palpitations.  He did note that the previous week he had a episode where he almost  passed out.  EMS was called.  His blood pressure was noted to be less than [id number] systolic.  He had not taken metoprolol since that time.  His blood pressure was noted to be 116/70.  Follow-up was planned for 6 months.  He presents to the clinic today for follow-up evaluation and states***  *** denies chest pain, shortness of breath, lower extremity edema, fatigue, palpitations, melena, hematuria, hemoptysis, diaphoresis, weakness, presyncope, syncope, orthopnea, and PND.  Coronary artery disease-no chest pain today.  Underwent cardiac catheterization and PCI with overlapping stents  to his LAD in 2009.  Stress testing 6/20 showed low risk and was unchanged from previous.  His echocardiogram was noted showed normal EF with no significant valvular abnormalities.  He is not a candidate for antiplatelet medication due to Xarelto. Continue metoprolol, Xarelto Heart healthy low-sodium diet-salty 6 given Increase physical activity as tolerated  Atrial fibrillation-denies recent episodes of irregular or accelerated heart rate.  Noted to be paroxysmal in nature.  Previously noted that nerve stimulator interfered with rhythm.  Previously noted increased heart rate due to discontinuation of metoprolol with low blood pressure.  CHA2DS2-VASc score 3.  Reports compliance with Xarelto.  Denies bleeding issues. Avoid triggers caffeine, chocolate, EtOH, dehydration etc.  Essential hypertension-BP today***. Continue metoprolol Heart healthy low-sodium diet  Hyperlipidemia-LDL*** Heart healthy low-sodium high-fiber diet Increase physical activity as tolerated  Second-degree block, Mobitz type I-EKG today shows***. Continue current medical therapy  EtOH abuse-again recommended total abstinence.  Disposition: Follow-up with Dr. Martinique in 6 months.  Home Medications    Prior to Admission medications   Medication Sig Start Date End Date Taking? Authorizing Provider  acetaminophen (TYLENOL) 325 MG tablet Take 650 mg by mouth 2 (two) times daily.    [provider]  allopurinol (ZYLOPRIM) 300 MG tablet Take 1 tablet (300 mg total) by mouth at bedtime. 03/23/21   Martinique, Peter M, MD  ALPRAZolam Duanne Moron) 1 MG tablet Take 1 mg by mouth daily as needed for sleep. 04/26/22 04/26/23  [provider]  ascorbic acid (VITAMIN C) 1000 MG tablet Take 1,000 mg by mouth daily.    [provider]  folic acid (FOLVITE) 1 MG tablet Take 1 mg by mouth daily. 01/21/19   [provider]  magnesium oxide (MAG-OX) 400 (240 Mg) MG tablet Take 400 mg by mouth daily. 04/16/22    [provider]  metoprolol tartrate (LOPRESSOR) 25 MG tablet Take 0.5 tablets (12.5 mg total) by mouth 2 (two) times daily. Patient taking differently: Take 25 mg by mouth at bedtime. 10/23/21 10/18/22  Martinique, Peter M, MD  mirtazapine (REMERON) 15 MG tablet Take 15 mg by mouth at bedtime. 05/12/22   [provider]  pantoprazole (PROTONIX) 40 MG tablet TAKE 1 TABLET(40 MG) BY MOUTH DAILY Patient taking differently: Take 40 mg by mouth daily. 03/28/22   Martinique, Peter M, MD  rivaroxaban (XARELTO) 20 MG TABS tablet Take 1 tablet (20 mg total) by mouth daily with supper. 08/16/22   Martinique, Peter M, MD  thiamine 100 MG tablet Take 1 tablet (100 mg total) by mouth 3 (three) times daily. Patient taking differently: Take 100 mg by mouth daily. 03/05/22   Horton, Barbette Hair, MD  traZODone (DESYREL) 50 MG tablet Take 25 mg by mouth at bedtime. 03/06/22   [provider]    Family History    Family History  Problem Relation Age of Onset   Emphysema Mother 85   Heart attack Father 61  He indicated that his mother is deceased. He indicated that his father is deceased. He indicated that all of his six sisters are alive. He indicated that his brother is alive. He indicated that his maternal grandmother is deceased. He indicated that his maternal grandfather is deceased. He indicated that his paternal grandmother is deceased. He indicated that his paternal grandfather is deceased.  Social History    Social History   Socioeconomic History   Marital status: Married    Spouse name: Not on file   Number of children: 3   Years of education: Not on file   Highest education level: Not on file  Occupational History   Occupation: used Teacher, early years/pre  Tobacco Use   Smoking status: Former    Types: Cigarettes    Quit date: 08/27/1990    Years since quitting: 32.0   Smokeless tobacco: Never  Vaping Use   Vaping Use: Never used  Substance and Sexual Activity   Alcohol use: Yes     Comment: near daily   Drug use: No   Sexual activity: Not on file  Other Topics Concern   Not on file  Social History Narrative   Not on file   Social Determinants of Health   Financial Resource Strain: Not on file  Food Insecurity: Not on file  Transportation Needs: Not on file  Physical Activity: Not on file  Stress: Not on file  Social Connections: Not on file  Intimate Partner Violence: Not on file     Review of Systems    General:  No chills, fever, night sweats or weight changes.  Cardiovascular:  No chest pain, dyspnea on exertion, edema, orthopnea, palpitations, paroxysmal nocturnal dyspnea. Dermatological: No rash, lesions/masses Respiratory: No cough, dyspnea Urologic: No hematuria, dysuria Abdominal:   No nausea, vomiting, diarrhea, bright red blood per rectum, melena, or hematemesis Neurologic:  No visual changes, wkns, changes in mental status. All other systems reviewed and are otherwise negative except as noted above.  Physical Exam    VS:  There were no vitals taken for this visit. , BMI There is no height or weight on file to calculate BMI. GEN: Well nourished, well developed, in no acute distress. HEENT: normal. Neck: Supple, no JVD, carotid bruits, or masses. Cardiac: RRR, no murmurs, rubs, or gallops. No clubbing, cyanosis, edema.  Radials/DP/PT 2+ and equal bilaterally.  Respiratory:  Respirations regular and unlabored, clear to auscultation bilaterally. GI: Soft, nontender, nondistended, BS + x 4. MS: no deformity or atrophy. Skin: warm and dry, no rash. Neuro:  Strength and sensation are intact. Psych: Normal affect.  Accessory Clinical Findings    Recent Labs: 05/13/2022: ALT 13; BUN 13; Creatinine, Ser 1.27; Hemoglobin 11.5; Magnesium 1.1; Platelets 247; Potassium 4.4; Sodium 132   Recent Lipid Panel    Component Value Date/Time   CHOL 176 06/10/2017 0839   TRIG 100 07/05/2020 0500   HDL 66 06/10/2017 0839   CHOLHDL 4.8 04/16/2016 1125    VLDL 44 (H) 04/16/2016 1125   LDLCALC 85 06/10/2017 0839    No BP recorded.  {Refresh Note OR Click here to enter BP  :1}***    ECG personally reviewed by me today- *** - No acute changes  Echocardiogram 06/23/2020  IMPRESSIONS     1. Left ventricular ejection fraction, by estimation, is 60 to 65%. The  left ventricle has normal function. The left ventricle has no regional  wall motion abnormalities. There is mild left ventricular hypertrophy.  Left ventricular diastolic parameters  are consistent with Grade I diastolic dysfunction (impaired relaxation).   2. Right ventricular systolic function is moderately reduced. The right  ventricular size is normal. There is normal pulmonary artery systolic  pressure. The estimated right ventricular systolic pressure is Q000111Q mmHg.  Abnormal RV, would consider PE.   3. Right atrial size was mildly dilated.   4. The mitral valve is normal in structure. No evidence of mitral valve  regurgitation. No evidence of mitral stenosis.   5. The aortic valve is tricuspid. Aortic valve regurgitation is trivial.  Mild aortic valve sclerosis is present, with no evidence of aortic valve  stenosis.   6. The inferior vena cava is normal in size with greater than 50%  respiratory variability, suggesting right atrial pressure of 3 mmHg.   FINDINGS   Left Ventricle: Left ventricular ejection fraction, by estimation, is 60  to 65%. The left ventricle has normal function. The left ventricle has no  regional wall motion abnormalities. The left ventricular internal cavity  size was normal in size. There is   mild left ventricular hypertrophy. Left ventricular diastolic parameters  are consistent with Grade I diastolic dysfunction (impaired relaxation).   Right Ventricle: The right ventricular size is normal. No increase in  right ventricular wall thickness. Right ventricular systolic function is  moderately reduced. There is normal pulmonary artery systolic  pressure.  The tricuspid regurgitant velocity is  2.75 m/s, and with an assumed right atrial pressure of 3 mmHg, the  estimated right ventricular systolic pressure is Q000111Q mmHg.   Left Atrium: Left atrial size was normal in size.   Right Atrium: Right atrial size was mildly dilated.   Pericardium: There is no evidence of pericardial effusion.   Mitral Valve: The mitral valve is normal in structure. No evidence of  mitral valve regurgitation. No evidence of mitral valve stenosis.   Tricuspid Valve: The tricuspid valve is normal in structure. Tricuspid  valve regurgitation is trivial.   Aortic Valve: The aortic valve is tricuspid. Aortic valve regurgitation is  trivial. Mild aortic valve sclerosis is present, with no evidence of  aortic valve stenosis.   Pulmonic Valve: The pulmonic valve was normal in structure. Pulmonic valve  regurgitation is not visualized.   Aorta: The aortic root is normal in size and structure.   Venous: The inferior vena cava is normal in size with greater than 50%  respiratory variability, suggesting right atrial pressure of 3 mmHg.   IAS/Shunts: No atrial level shunt detected by color flow Doppler.        Assessment & Plan   1.  ***   Jossie Ng. Timesha Cervantez NP-C     09/10/2022, 6:34 AM [facility] [address] Office [phone] Fax [phone]    I spent***minutes examining this patient, reviewing medications, and using patient centered shared decision making involving her cardiac care.  Prior to her visit I spent greater than 20 minutes reviewing her past medical history,  medications, and prior cardiac tests.

## 2022-09-12 ENCOUNTER — Ambulatory Visit: Payer: Medicare Other | Attending: General Practice | Admitting: General Practice

## 2022-09-14 NOTE — Progress Notes (Signed)
Selinda Michaels Date of Birth: 06/24/1941   History of Present Illness: Mr. Banke is seen for follow up of CAD and atrial fibrillation.  He has a history of coronary disease and is status post stenting of the LAD with overlapping Taxus stents in 2009. Myoview in April 2015 showed mild apical thinning, normal EF 67%.  He  was seen in the ED in February 2016 for symptoms of left arm numbness and pain. This was worse with lifting his arm. Ecg was done and demonstrated tachycardia with no clear P waves and rate 113.   He was seen in June 2016 symptoms of fatigue and acute gout flair in his right knee bursa. He was noted to be in atrial fibrillation with RVR (and review of ECG from February showed the same). He had been drinking Etoh heavily. He was started on metoprolol and Xarelto.   He was scheduled for a DCCV on 03/03/15 but on arrival he was in NSR with rate 77.   He was seen in the ED on 06/20/18 with abdominal pain and had an umbilical hernia reduced.   He was seen in June 2020  with symptoms of increased DOE and fatigue. Was noted to be anemic with Hgb 9. Low folate. Normal BNP. Echo was normal. Myoview was low risk without significant ischemia and unchanged from prior studies. He was started on iron therapy. GI evaluation was warranted but he did not pursue. Hgb did improve to 11.6. He is also B12 deficient and on shots.   He was admitted in November 2022 with acute encephalopathy. Had tested positive for Covid 19 10 days before. He had respiratory failure and required intubation. He had enterococcal bacteremia treated with 6 weeks of IV antibiotics. He had GI bleeding with EGD showing some gastritis. He had been drinking Etoh heavily and had DTs. He remained significantly deconditioned. Palliative care was consulted and he was a DNR. Echo showed normal EF. No significant valvular abnormality. Moderate RV dysfunction. He had third spacing of fluid with edema. Persistent dysphagia due to  deconditioning. He had urinary retention that later resolved. When seen in follow up with PCP he was persistently hypotensive and amlodipine was discontinued.   He was admitted to [facility] in late July with bilateral CAP, Etoh withdrawal and hypomagnesemia.   Seen with his daughter in law today.  He is living with her niece and able to do his ADLs. He complains of a lot of arthritic pain.  Family reports he is still drinking a 1/2 gallon of liquor a day. Gets around with a walker. Gets SOB with walking. No chest pain, edema  or palpitations.    Current Outpatient Medications on File Prior to Visit  Medication Sig Dispense Refill   acetaminophen (TYLENOL) 325 MG tablet Take 650 mg by mouth 2 (two) times daily.     allopurinol (ZYLOPRIM) 300 MG tablet Take 1 tablet (300 mg total) by mouth at bedtime. 90 tablet 3   ALPRAZolam (XANAX) 1 MG tablet Take 1 mg by mouth daily as needed for sleep.     ascorbic acid (VITAMIN C) 1000 MG tablet Take 1,000 mg by mouth daily.     folic acid (FOLVITE) 1 MG tablet Take 1 mg by mouth daily.     magnesium oxide (MAG-OX) 400 (240 Mg) MG tablet Take 400 mg by mouth daily.     pantoprazole (PROTONIX) 40 MG tablet TAKE 1 TABLET(40 MG) BY MOUTH DAILY (Patient taking differently: Take 40 mg by  mouth daily.) 90 tablet 3   thiamine 100 MG tablet Take 1 tablet (100 mg total) by mouth 3 (three) times daily. (Patient taking differently: Take 100 mg by mouth daily.) 30 tablet 0   No current facility-administered medications on file prior to visit.    Allergies  Allergen Reactions   Lorazepam Other (See Comments)    Extreme delirium    Oxycodone Other (See Comments)    confusion    Past Medical History:  Diagnosis Date   Arthritis    Atrial fibrillation (HCC) 01/31/2015   Chronic gout    Coronary artery disease 12/2007   STATUS POST STENTING OF THE LEFT ANTERIOR DESCENDING ARTERY   COVID-19    Dyslipidemia    Gout    Hiatal hernia    Hyperlipidemia     Hypertension    LBP (low back pain)    Partial small bowel obstruction (HCC)    RBBB (right bundle branch block)    SBO (small bowel obstruction) (HCC)    PARTIAL   Second degree AV block, Mobitz type I    Urinary tract infection    Escherichia coli urinary tract infection    Past Surgical History:  Procedure Laterality Date   CARDIAC CATHETERIZATION     ESOPHAGOGASTRODUODENOSCOPY N/A 06/30/2020   Procedure: ESOPHAGOGASTRODUODENOSCOPY (EGD);  Surgeon: Vida Rigger, MD;  Location: [facility] ENDOSCOPY;  Service: Endoscopy;  Laterality: N/A;   HERNIA REPAIR     INGUINAL   ROTATOR CUFF REPAIR      Social History   Tobacco Use  Smoking Status Former   Types: Cigarettes   Quit date: 08/27/1990   Years since quitting: 32.0  Smokeless Tobacco Never    Social History   Substance and Sexual Activity  Alcohol Use Yes   Comment: near daily    Family History  Problem Relation Age of Onset   Emphysema Mother 23   Heart attack Father 66    Review of Systems: As noted in HPI All other systems were reviewed and are negative.  Physical Exam: BP (!) 118/52 (BP Location: Left Arm, Patient Position: Sitting, Cuff Size: Normal)   Pulse 66   Wt 236 lb (107 kg)   SpO2 95%   BMI 32.92 kg/m  GENERAL:  Overweight WM in NAD HEENT:  PERRL, EOMI, sclera are clear. Oropharynx is clear. NECK:  No jugular venous distention, carotid upstroke brisk and symmetric, no bruits, no thyromegaly or adenopathy LUNGS:  Clear to auscultation bilaterally CHEST:  Unremarkable HEART:  RRR,  PMI not displaced or sustained,S1 and S2 within normal limits, no S3, no S4: no clicks, no rubs, no murmurs ABD:  Soft, nontender. BS +, no masses or bruits. No hepatomegaly, no splenomegaly EXT:  2 + pulses throughout, no edema, no cyanosis no clubbing SKIN:  Warm and dry.  No rashes NEURO:  Alert and oriented x 3. Cranial nerves II through XII intact. PSYCH:  Cognitively intact   LABORATORY DATA: Lab Results   Component Value Date   WBC 7.6 05/13/2022   HGB 11.5 (L) 05/13/2022   HCT 35.1 (L) 05/13/2022   PLT 247 05/13/2022   GLUCOSE 90 05/13/2022   CHOL 176 06/10/2017   TRIG 100 07/05/2020   HDL 66 06/10/2017   LDLCALC 85 06/10/2017   ALT 13 05/13/2022   AST 25 05/13/2022   NA 132 (L) 05/13/2022   K 4.4 05/13/2022   CL 101 05/13/2022   CREATININE 1.27 (H) 05/13/2022   BUN 13 05/13/2022   CO2  19 (L) 05/13/2022   TSH 4.300 07/01/2018   INR 1.4 04/24/2016   HGBA1C 5.9 (H) 06/29/2020   Dated 12/30/18: cholesterol 129, triglycerides 97, HDL 47, LDL 85. Normal TSH. Dated 03/27/19: Hgb 11.3. ferritin 17. B12 175,  Dated 11/24/19: Hgb 9.8. albumin 3.2. otherwise CMET normal. B12 and folate normal. Iron 36 with sat 11%. Ferritin 15.  Dated 10/06/21: sodium 130, creatinine 1.96. otherwise CMET normal. TSH normal. Hgb 9.1. iron 23 with sat 5%. TIBC normal. Dated 04/26/22: CMET normal. Magnesium 1.1. Hgb 12.3.   CXR 04/19/22: NAD  Ecg today shows rate 109. There is considerable electrical artifact due to his nerve stimulator. Unable to tell if this is sinus but it is regular. RBBB. LAD. I have personally reviewed and interpreted this study.   Echo: 02/09/15  Study Conclusions  - Left ventricle: The cavity size was normal. There was mild focal   basal hypertrophy of the septum. Systolic function was normal.   The estimated ejection fraction was in the range of 60% to 65%.   Wall motion was normal; there were no regional wall motion   abnormalities. - Aortic valve: Trileaflet; normal thickness, mildly calcified   leaflets. - Mitral valve: Calcified annulus. - Pulmonic valve: There was trivial regurgitation.  Echo 02/17/19: IMPRESSIONS      1. The left ventricle has normal systolic function, with an ejection fraction of 55-60%. The cavity size was normal. There is mildly increased left ventricular wall thickness. Left ventricular diastolic Doppler parameters are consistent with impaired   relaxation. Indeterminate filling pressures The E/e' is 8-15. No evidence of left ventricular regional wall motion abnormalities.  2. The right ventricle has normal systolic function. The cavity was normal. There is no increase in right ventricular wall thickness.  3. The mitral valve is grossly normal.  4. The tricuspid valve is grossly normal.  5. The aortic valve is abnormal. Mild sclerosis of the aortic valve. Aortic valve regurgitation is mild by color flow Doppler. No stenosis of the aortic valve.  6. 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  7. When compared to the prior study: 02/09/15 EF 60-65%.  Myoview 02/12/19: Study Highlights    The left ventricular ejection fraction is normal (55-65%). Nuclear stress EF: 60%. There was no ST segment deviation noted during stress. Defect 1: There is a medium defect of moderate severity present in the basal inferolateral, mid inferolateral and apex location. This is a low risk study.   Normal, low risk stress nuclear study with prominent inferior and apical thinning but no ischemia.  Gated ejection fraction 60% with normal wall motion.    Echo 06/23/20: IMPRESSIONS     1. Left ventricular ejection fraction, by estimation, is 60 to 65%. The  left ventricle has normal function. The left ventricle has no regional  wall motion abnormalities. There is mild left ventricular hypertrophy.  Left ventricular diastolic parameters  are consistent with Grade I diastolic dysfunction (impaired relaxation).   2. Right ventricular systolic function is moderately reduced. The right  ventricular size is normal. There is normal pulmonary artery systolic  pressure. The estimated right ventricular systolic pressure is 64.3 mmHg.  Abnormal RV, would consider PE.   3. Right atrial size was mildly dilated.   4. The mitral valve is normal in structure. No evidence of mitral valve  regurgitation. No evidence of mitral stenosis.   5. The  aortic valve is tricuspid. Aortic valve regurgitation is trivial.  Mild aortic valve sclerosis  is present, with no evidence of aortic valve  stenosis.   6. The inferior vena cava is normal in size with greater than 50%  respiratory variability, suggesting right atrial pressure of 3 mmHg.   Assessment / Plan: 1. Coronary disease status post stenting of the LAD in 2009 with Taxus stents. Nuclear stress test in June 2020 was low risk and unchanged from prior. Echo normal. He has no significant angina. On low dose metoprolol.  No Antiplatelet therapy now that he is on Xarelto. Not a candidate for invasive procedures given ongoing heavy Etoh use and co-morbidities.  2. Atrial fibrillation- paroxysmal. On metoprolol at 12.5 mg bid.   He is asymptomatic. [place] vasc score is 3.  Avoid NSAIDs. Now on Xarelto but concerned with heavy Etoh use. Would have a low threshold for stopping anticoagulation if he has any bleeding issues.   3. Hyperlipidemia   4. History of Mobitz type 1 second degree AV block. RBBB. If he has recurrent syncope will need to have him wear a monitor.   5. HTN   controlled.   6. Gout. Asymptomatic.  7. Etoh abuse. Still drinking heavily. Recommend total abstinence.   8. Obesity.   9. Peripheral neuropathy.     I will follow up in one year

## 2022-09-17 ENCOUNTER — Encounter: Payer: Self-pay | Admitting: Cardiology

## 2022-09-17 ENCOUNTER — Ambulatory Visit: Payer: Medicare Other | Attending: General Practice | Admitting: Cardiology

## 2022-09-17 VITALS — BP 118/52 | HR 66 | Wt 236.0 lb

## 2022-09-17 DIAGNOSIS — I480 Paroxysmal atrial fibrillation: Secondary | ICD-10-CM | POA: Diagnosis not present

## 2022-09-17 DIAGNOSIS — I2510 Atherosclerotic heart disease of native coronary artery without angina pectoris: Secondary | ICD-10-CM | POA: Insufficient documentation

## 2022-09-17 DIAGNOSIS — I4819 Other persistent atrial fibrillation: Secondary | ICD-10-CM | POA: Insufficient documentation

## 2022-09-17 DIAGNOSIS — I10 Essential (primary) hypertension: Secondary | ICD-10-CM | POA: Insufficient documentation

## 2022-09-17 MED ORDER — RIVAROXABAN 20 MG PO TABS: 20.0000 mg | ORAL_TABLET | Freq: Every day | ORAL | 3 refills | Status: DC

## 2022-09-17 MED ORDER — METOPROLOL TARTRATE 25 MG PO TABS: 12.5000 mg | ORAL_TABLET | Freq: Two times a day (BID) | ORAL | 3 refills | Status: DC

## 2022-09-17 NOTE — Patient Instructions (Signed)
Medication Instructions:  Your physician recommends that you continue on your current medications as directed. Please refer to the Current Medication list given to you today.  *If you need a refill on your cardiac medications before your next appointment, please call your pharmacy*   Lab Work: None   Testing/Procedures: None   Follow-Up: At [facility], you and your health needs are our priority.  As part of our continuing mission to provide you with exceptional heart care, we have created designated Provider Care Teams.  These Care Teams include your primary Cardiologist (physician) and Advanced Practice Providers (APPs -  Physician Assistants and Nurse Practitioners) who all work together to provide you with the care you need, when you need it.  We recommend signing up for the patient portal called "MyChart".  Sign up information is provided on this After Visit Summary.  MyChart is used to connect with patients for Virtual Visits (Telemedicine).  Patients are able to view lab/test results, encounter notes, upcoming appointments, etc.  Non-urgent messages can be sent to your provider as well.   To learn more about what you can do with MyChart, go to [email or web address].    Your next appointment:   1 year(s)  Provider:   Peter Jordan, MD     Other Instructions   

## 2023-02-20 ENCOUNTER — Telehealth: Payer: Self-pay | Admitting: Cardiology

## 2023-02-20 NOTE — Telephone Encounter (Signed)
Pt's daughter Basil Dess is requesting a callback from [place] regarding an appt and pt's most recent ED visit. Please advise

## 2023-02-20 NOTE — Telephone Encounter (Signed)
Left message for the pt to call the clinic

## 2023-02-20 NOTE — Telephone Encounter (Signed)
Patient went to ED on 6/24 but left AMA. The discharge information states to follow up with cardiology. Appointment offered for July 2nd with [facility]. Daughter stated she will take July 8th with Dr. Swaziland

## 2023-02-20 NOTE — Telephone Encounter (Signed)
Gregory Wheeler was returning LPN call and is requesting a callback at [phone]. Please advise

## 2023-02-28 NOTE — Progress Notes (Deleted)
Gregory Wheeler Date of Birth: Nov 18, 1940   History of Present Illness: Gregory Wheeler is seen for follow up of CAD and atrial fibrillation.  He has a history of coronary disease and is status post stenting of the LAD with overlapping Taxus stents in 2009. Myoview in April 2015 showed mild apical thinning, normal EF 67%.  He  was seen in the ED in February 2016 for symptoms of left arm numbness and pain. This was worse with lifting his arm. Ecg was done and demonstrated tachycardia with no clear P waves and rate 113.   He was seen in June 2016 symptoms of fatigue and acute gout flair in his right knee bursa. He was noted to be in atrial fibrillation with RVR (and review of ECG from February showed the same). He had been drinking Etoh heavily. He was started on metoprolol and Xarelto.   He was scheduled for a DCCV on 03/03/15 but on arrival he was in NSR with rate 77.   He was seen in the ED on 06/20/18 with abdominal pain and had an umbilical hernia reduced.   He was seen in June 2020  with symptoms of increased DOE and fatigue. Was noted to be anemic with Hgb 9. Low folate. Normal BNP. Echo was normal. Myoview was low risk without significant ischemia and unchanged from prior studies. He was started on iron therapy. GI evaluation was warranted but he did not pursue. Hgb did improve to 11.6. He is also B12 deficient and on shots.   He was admitted in November 2022 with acute encephalopathy. Had tested positive for Covid 19 10 days before. He had respiratory failure and required intubation. He had enterococcal bacteremia treated with 6 weeks of IV antibiotics. He had GI bleeding with EGD showing some gastritis. He had been drinking Etoh heavily and had DTs. He remained significantly deconditioned. Palliative care was consulted and he was a DNR. Echo showed normal EF. No significant valvular abnormality. Moderate RV dysfunction. He had third spacing of fluid with edema. Persistent dysphagia due to  deconditioning. He had urinary retention that later resolved. When seen in follow up with PCP he was persistently hypotensive and amlodipine was discontinued.   He was admitted to [facility] in late July with bilateral CAP, Etoh withdrawal and hypomagnesemia.   He was seen in the ED on 6/24 with 2 week history of weakness. He had low magnesium and potassium. These were repleted. CXR and CT head were unremarkable. Troponin T 26 and 28. Sodium was 129, Hgb 12.4. Mg 1.4.   Seen with his daughter in law today.  He is living with her niece and able to do his ADLs. He complains of a lot of arthritic pain.  Family reports he is still drinking a 1/2 gallon of liquor a day. Gets around with a walker. Gets SOB with walking. No chest pain, edema  or palpitations.    Current Outpatient Medications on File Prior to Visit  Medication Sig Dispense Refill   acetaminophen (TYLENOL) 325 MG tablet Take 650 mg by mouth 2 (two) times daily.     allopurinol (ZYLOPRIM) 300 MG tablet Take 1 tablet (300 mg total) by mouth at bedtime. 90 tablet 3   ALPRAZolam (XANAX) 1 MG tablet Take 1 mg by mouth daily as needed for sleep.     ascorbic acid (VITAMIN C) 1000 MG tablet Take 1,000 mg by mouth daily.     folic acid (FOLVITE) 1 MG tablet Take 1 mg by  mouth daily.     magnesium oxide (MAG-OX) 400 (240 Mg) MG tablet Take 400 mg by mouth daily.     metoprolol tartrate (LOPRESSOR) 25 MG tablet Take 0.5 tablets (12.5 mg total) by mouth 2 (two) times daily. 90 tablet 3   pantoprazole (PROTONIX) 40 MG tablet TAKE 1 TABLET(40 MG) BY MOUTH DAILY (Patient taking differently: Take 40 mg by mouth daily.) 90 tablet 3   rivaroxaban (XARELTO) 20 MG TABS tablet Take 1 tablet (20 mg total) by mouth daily with supper. 90 tablet 3   thiamine 100 MG tablet Take 1 tablet (100 mg total) by mouth 3 (three) times daily. (Patient taking differently: Take 100 mg by mouth daily.) 30 tablet 0   No current facility-administered medications  on file prior to visit.    Allergies  Allergen Reactions   Lorazepam Other (See Comments)    Extreme delirium    Oxycodone Other (See Comments)    confusion    Past Medical History:  Diagnosis Date   Arthritis    Atrial fibrillation (HCC) 01/31/2015   Chronic gout    Coronary artery disease 12/2007   STATUS POST STENTING OF THE LEFT ANTERIOR DESCENDING ARTERY   COVID-19    Dyslipidemia    Gout    Hiatal hernia    Hyperlipidemia    Hypertension    LBP (low back pain)    Partial small bowel obstruction (HCC)    RBBB (right bundle branch block)    SBO (small bowel obstruction) (HCC)    PARTIAL   Second degree AV block, Mobitz type I    Urinary tract infection    Escherichia coli urinary tract infection    Past Surgical History:  Procedure Laterality Date   CARDIAC CATHETERIZATION     ESOPHAGOGASTRODUODENOSCOPY N/A 06/30/2020   Procedure: ESOPHAGOGASTRODUODENOSCOPY (EGD);  Surgeon: Vida Rigger, MD;  Location: [facility] ENDOSCOPY;  Service: Endoscopy;  Laterality: N/A;   HERNIA REPAIR     INGUINAL   ROTATOR CUFF REPAIR      Social History   Tobacco Use  Smoking Status Former   Types: Cigarettes   Quit date: 08/27/1990   Years since quitting: 32.5  Smokeless Tobacco Never    Social History   Substance and Sexual Activity  Alcohol Use Yes   Comment: near daily    Family History  Problem Relation Age of Onset   Emphysema Mother 31   Heart attack Father 56    Review of Systems: As noted in HPI All other systems were reviewed and are negative.  Physical Exam: There were no vitals taken for this visit. GENERAL:  Overweight WM in NAD HEENT:  PERRL, EOMI, sclera are clear. Oropharynx is clear. NECK:  No jugular venous distention, carotid upstroke brisk and symmetric, no bruits, no thyromegaly or adenopathy LUNGS:  Clear to auscultation bilaterally CHEST:  Unremarkable HEART:  RRR,  PMI not displaced or sustained,S1 and S2 within normal limits, no S3, no S4: no  clicks, no rubs, no murmurs ABD:  Soft, nontender. BS +, no masses or bruits. No hepatomegaly, no splenomegaly EXT:  2 + pulses throughout, no edema, no cyanosis no clubbing SKIN:  Warm and dry.  No rashes NEURO:  Alert and oriented x 3. Cranial nerves II through XII intact. PSYCH:  Cognitively intact   LABORATORY DATA: Lab Results  Component Value Date   WBC 7.6 05/13/2022   HGB 11.5 (L) 05/13/2022   HCT 35.1 (L) 05/13/2022   PLT 247 05/13/2022   GLUCOSE  90 05/13/2022   CHOL 176 06/10/2017   TRIG 100 07/05/2020   HDL 66 06/10/2017   LDLCALC 85 06/10/2017   ALT 13 05/13/2022   AST 25 05/13/2022   NA 132 (L) 05/13/2022   K 4.4 05/13/2022   CL 101 05/13/2022   CREATININE 1.27 (H) 05/13/2022   BUN 13 05/13/2022   CO2 19 (L) 05/13/2022   TSH 4.300 07/01/2018   INR 1.4 04/24/2016   HGBA1C 5.9 (H) 06/29/2020   Dated 12/30/18: cholesterol 129, triglycerides 97, HDL 47, LDL 85. Normal TSH. Dated 03/27/19: Hgb 11.3. ferritin 17. B12 175,  Dated 11/24/19: Hgb 9.8. albumin 3.2. otherwise CMET normal. B12 and folate normal. Iron 36 with sat 11%. Ferritin 15.  Dated 10/06/21: sodium 130, creatinine 1.96. otherwise CMET normal. TSH normal. Hgb 9.1. iron 23 with sat 5%. TIBC normal. Dated 04/26/22: CMET normal. Magnesium 1.1. Hgb 12.3.   CXR 04/19/22: NAD  Ecg today shows rate 109. There is considerable electrical artifact due to his nerve stimulator. Unable to tell if this is sinus but it is regular. RBBB. LAD. I have personally reviewed and interpreted this study.   Echo: 02/09/15  Study Conclusions  - Left ventricle: The cavity size was normal. There was mild focal   basal hypertrophy of the septum. Systolic function was normal.   The estimated ejection fraction was in the range of 60% to 65%.   Wall motion was normal; there were no regional wall motion   abnormalities. - Aortic valve: Trileaflet; normal thickness, mildly calcified   leaflets. - Mitral valve: Calcified annulus. -  Pulmonic valve: There was trivial regurgitation.  Echo 02/17/19: IMPRESSIONS      1. The left ventricle has normal systolic function, with an ejection fraction of 55-60%. The cavity size was normal. There is mildly increased left ventricular wall thickness. Left ventricular diastolic Doppler parameters are consistent with impaired  relaxation. Indeterminate filling pressures The E/e' is 8-15. No evidence of left ventricular regional wall motion abnormalities.  2. The right ventricle has normal systolic function. The cavity was normal. There is no increase in right ventricular wall thickness.  3. The mitral valve is grossly normal.  4. The tricuspid valve is grossly normal.  5. The aortic valve is abnormal. Mild sclerosis of the aortic valve. Aortic valve regurgitation is mild by color flow Doppler. No stenosis of the aortic valve.  6. 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  7. When compared to the prior study: 02/09/15 EF 60-65%.  Myoview 02/12/19: Study Highlights    The left ventricular ejection fraction is normal (55-65%). Nuclear stress EF: 60%. There was no ST segment deviation noted during stress. Defect 1: There is a medium defect of moderate severity present in the basal inferolateral, mid inferolateral and apex location. This is a low risk study.   Normal, low risk stress nuclear study with prominent inferior and apical thinning but no ischemia.  Gated ejection fraction 60% with normal wall motion.    Echo 06/23/20: IMPRESSIONS     1. Left ventricular ejection fraction, by estimation, is 60 to 65%. The  left ventricle has normal function. The left ventricle has no regional  wall motion abnormalities. There is mild left ventricular hypertrophy.  Left ventricular diastolic parameters  are consistent with Grade I diastolic dysfunction (impaired relaxation).   2. Right ventricular systolic function is moderately reduced. The right  ventricular size is  normal. There is normal pulmonary artery systolic  pressure. The estimated  right ventricular systolic pressure is 33.2 mmHg.  Abnormal RV, would consider PE.   3. Right atrial size was mildly dilated.   4. The mitral valve is normal in structure. No evidence of mitral valve  regurgitation. No evidence of mitral stenosis.   5. The aortic valve is tricuspid. Aortic valve regurgitation is trivial.  Mild aortic valve sclerosis is present, with no evidence of aortic valve  stenosis.   6. The inferior vena cava is normal in size with greater than 50%  respiratory variability, suggesting right atrial pressure of 3 mmHg.   Assessment / Plan: 1. Coronary disease status post stenting of the LAD in 2009 with Taxus stents. Nuclear stress test in June 2020 was low risk and unchanged from prior. Echo normal. He has no significant angina. On low dose metoprolol.  No Antiplatelet therapy now that he is on Xarelto. Not a candidate for invasive procedures given ongoing heavy Etoh use and co-morbidities.  2. Atrial fibrillation- paroxysmal. On metoprolol at 12.5 mg bid.   He is asymptomatic. [place] vasc score is 3.  Avoid NSAIDs. Now on Xarelto but concerned with heavy Etoh use. Would have a low threshold for stopping anticoagulation if he has any bleeding issues.   3. Hyperlipidemia   4. History of Mobitz type 1 second degree AV block. RBBB. If he has recurrent syncope will need to have him wear a monitor.   5. HTN   controlled.   6. Gout. Asymptomatic.  7. Etoh abuse. Still drinking heavily. Recommend total abstinence.   8. Obesity.   9. Peripheral neuropathy.     I will follow up in one year

## 2023-03-04 ENCOUNTER — Ambulatory Visit: Payer: Medicare Other | Attending: Cardiology | Admitting: Cardiology

## 2023-03-04 ENCOUNTER — Encounter: Payer: Self-pay | Admitting: Cardiology

## 2023-03-06 NOTE — Progress Notes (Deleted)
Cardiology Office Note:  .   Date:  03/06/2023  ID:  Gregory Wheeler, DOB [birth year], MRN [id number] PCP: Jacqualine Mau, NP  [facility] Providers Cardiologist:  Peter Swaziland, MD}   History of Present Illness: .   Gregory Wheeler is a 82 y.o. male CAD, paroxysmal atrial fibrillation, HTN,  alcohol abuse, gout, GERD, depression. Patient is followed by Dr. Swaziland and presents today for a hospital follow up appointment   Per chart review, patient previously had stenting of the LAD with overlapping stents in 2009.  Nuclear stress test in 11/2013 showed mild apical thinning with a normal EF of 67%.  He was later seen in 01/2015 complaining of fatigue.  He was found to be in atrial fibrillation with RVR.  Of note, patient had been drinking alcohol heavily at the time.  He was started on metoprolol and Xarelto.  Echocardiogram in 01/2015 showed EF 60-65%, no regional wall motion abnormalities.  Patient was scheduled for outpatient cardioversion but when he arrived to his appointment he was in normal sinus rhythm.  In 01/2019, patient complained of dyspnea on exertion and underwent nuclear stress test on 02/12/2027 that was a normal, low risk nuclear study with prominent inferior and apical thinning but no ischemia.  EF was 60% with normal wall motion.  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  His most recent echocardiogram from 05/2020 showed EF 60-65%, no regional wall motion abnormalities, mild LVH, grade 1 diastolic dysfunction, moderately reduced RV systolic function, normal pulmonary artery systolic pressure.  Patient was last seen by Dr. Swaziland on 09/17/22. At that time, he was living with his niece and he was able to do his ADLs. He was still drinking 1/2 gallon of liquor daily. No chest pain, edema, or palpitations. Patient remained on metoprolol 12.5 mg BID, xarelto.   Patient  was recently seen in the ED in [place] ([facility]) on 02/18/23 complaining of generalized weakness. In the ED, labs showed hypomagnesemia and hyponatremia, and he received supplementation. EKG showed sinus bradycardia with HR 53 BPM with a RBBB. CXR showed no acute abnormalities. Patient was offered admission for hyponatremia and bradycardia, but he opted to leave AMA.  Sinus Bradycardia  Paroxysmal Atrial Fibrillation  History of Mobitz type 1 second degree AV block  - Patient was seen in the ED on 6/24 complaining of weakness- EKG showed sinus bradycardia with HR 53 BPM  - EKG today in clinic showed ***  - Monitor? ***  - Continue metoprolol tartrate 25 mg daily  - Continue xarelto 20 mg daily -- note, patient has history of heavy alcohol use. Low threshold to stop anticoagulation if he has frequent falls/bleeding issues   Generalized Weakness  - Patient presented to the ED on 6/24 complaining of generalized weakness. Found to have mag 1.4, Na 129. Patient was given supplementation in the ED and left AMA  - Hemoglobin 12.5 and TSH normal in 01/2023  - Check CMP today    CAD  - Patient previously stenting of the LAD with overlapping stents in 2009 - Most recent ischemic evaluation was a nuclear stress test in 01/2019 that was a normal, low risk study with prominent inferior and apical thinning but no ischemia  - Continue metoprolol  - Not on ASA due to xarelto use   HLD  - Patient has documented history of HLD, but is not on statin  therapy  - Most recent lipid panel is from 12/2018 and showed LDL 63  - Recheck lipid panel today and CMP  HTN  - BP well controlled on current medications   Alcohol Abuse     ROS: ***  Studies Reviewed: .   Cardiac Studies & Procedures     STRESS TESTS  MYOCARDIAL PERFUSION IMAGING 02/12/2019  Narrative  The left ventricular ejection fraction is normal (55-65%).  Nuclear stress EF: 60%.  There was no ST segment deviation noted during  stress.  Defect 1: There is a medium defect of moderate severity present in the basal inferolateral, mid inferolateral and apex location.  This is a low risk study.  Normal, low risk stress nuclear study with prominent inferior and apical thinning but no ischemia.  Gated ejection fraction 60% with normal wall motion.   ECHOCARDIOGRAM  ECHOCARDIOGRAM COMPLETE 06/23/2020  Narrative ECHOCARDIOGRAM REPORT    Patient Name:   Gregory Wheeler Date of Exam: 06/23/2020 Medical Rec #:  [id number]      Height:       71.0 in Accession #:    [id number]     Weight:       244.7 lb Date of Birth:  1941-01-24      BSA:          2.297 m Patient Age:    79 years       BP:           170/76 mmHg Patient Gender: M              HR:           71 bpm. Exam Location:  Inpatient  Procedure: 2D Echo, Cardiac Doppler and Color Doppler  Indications:    R07.9* Chest pain, unspecified. Elevated troponin  History:        Patient has prior history of Echocardiogram examinations, most recent 02/24/2019. CAD, Abnormal ECG, Arrythmias:RBBB, Signs/Symptoms:Hypotension; Risk Factors:Dyslipidemia. Covid 19 positive. ETOH.  Sonographer:    Sheralyn Boatman RDCS Referring Phys: [id number] ANASTASSIA DOUTOVA   Sonographer Comments: Patient in Fowlaer's position. IMPRESSIONS   1. Left ventricular ejection fraction, by estimation, is 60 to 65%. The left ventricle has normal function. The left ventricle has no regional wall motion abnormalities. There is mild left ventricular hypertrophy. Left ventricular diastolic parameters are consistent with Grade I diastolic dysfunction (impaired relaxation). 2. Right ventricular systolic function is moderately reduced. The right ventricular size is normal. There is normal pulmonary artery systolic pressure. The estimated right ventricular systolic pressure is 33.2 mmHg. Abnormal RV, would consider PE. 3. Right atrial size was mildly dilated. 4. The mitral valve is normal in structure. No  evidence of mitral valve regurgitation. No evidence of mitral stenosis. 5. The aortic valve is tricuspid. Aortic valve regurgitation is trivial. Mild aortic valve sclerosis is present, with no evidence of aortic valve stenosis. 6. The inferior vena cava is normal in size with greater than 50% respiratory variability, suggesting right atrial pressure of 3 mmHg.  FINDINGS Left Ventricle: Left ventricular ejection fraction, by estimation, is 60 to 65%. The left ventricle has normal function. The left ventricle has no regional wall motion abnormalities. The left ventricular internal cavity size was normal in size. There is mild left ventricular hypertrophy. Left ventricular diastolic parameters are consistent with Grade I diastolic dysfunction (impaired relaxation).  Right Ventricle: The right ventricular size is normal. No increase in right ventricular wall thickness. Right ventricular systolic function is moderately reduced. There is normal pulmonary artery  systolic pressure. The tricuspid regurgitant velocity is 2.75 m/s, and with an assumed right atrial pressure of 3 mmHg, the estimated right ventricular systolic pressure is 33.2 mmHg.  Left Atrium: Left atrial size was normal in size.  Right Atrium: Right atrial size was mildly dilated.  Pericardium: There is no evidence of pericardial effusion.  Mitral Valve: The mitral valve is normal in structure. No evidence of mitral valve regurgitation. No evidence of mitral valve stenosis.  Tricuspid Valve: The tricuspid valve is normal in structure. Tricuspid valve regurgitation is trivial.  Aortic Valve: The aortic valve is tricuspid. Aortic valve regurgitation is trivial. Mild aortic valve sclerosis is present, with no evidence of aortic valve stenosis.  Pulmonic Valve: The pulmonic valve was normal in structure. Pulmonic valve regurgitation is not visualized.  Aorta: The aortic root is normal in size and structure.  Venous: The inferior vena  cava is normal in size with greater than 50% respiratory variability, suggesting right atrial pressure of 3 mmHg.  IAS/Shunts: No atrial level shunt detected by color flow Doppler.   LEFT VENTRICLE PLAX 2D LVIDd:         3.80 cm     Diastology LVIDs:         2.60 cm     LV e' medial:    4.35 cm/s LV PW:         1.70 cm     LV E/e' medial:  16.9 LV IVS:        1.50 cm     LV e' lateral:   5.33 cm/s LVOT diam:     2.30 cm     LV E/e' lateral: 13.8 LV SV:         97 LV SV Index:   42 LVOT Area:     4.15 cm  LV Volumes (MOD) LV vol d, MOD A2C: 78.7 ml LV vol d, MOD A4C: 84.2 ml LV vol s, MOD A2C: 17.5 ml LV vol s, MOD A4C: 25.2 ml LV SV MOD A2C:     61.2 ml LV SV MOD A4C:     84.2 ml LV SV MOD BP:      64.3 ml  RIGHT VENTRICLE             IVC RV S prime:     18.50 cm/s  IVC diam: 2.20 cm TAPSE (M-mode): 3.2 cm  LEFT ATRIUM             Index       RIGHT ATRIUM           Index LA diam:        3.70 cm 1.61 cm/m  RA Area:     18.30 cm LA Vol (A2C):   34.1 ml 14.84 ml/m RA Volume:   53.10 ml  23.11 ml/m LA Vol (A4C):   49.7 ml 21.63 ml/m LA Biplane Vol: 43.7 ml 19.02 ml/m AORTIC VALVE LVOT Vmax:   124.00 cm/s LVOT Vmean:  89.800 cm/s LVOT VTI:    0.233 m  AORTA Ao Root diam: 3.50 cm  MITRAL VALVE               TRICUSPID VALVE MV Area (PHT): 3.21 cm    TR Peak grad:   30.2 mmHg MV Decel Time: 236 msec    TR Vmax:        275.00 cm/s MV E velocity: 73.30 cm/s MV A velocity: 99.05 cm/s  SHUNTS MV E/A ratio:  0.74  Systemic VTI:  0.23 m Systemic Diam: 2.30 cm  Marca Ancona MD Electronically signed by Marca Ancona MD Signature Date/Time: 06/23/2020/4:53:51 PM    Final              Risk Assessment/Calculations:   {Does this patient have ATRIAL FIBRILLATION?[phone]} No BP recorded.  {Refresh Note OR Click here to enter BP  :1}***       Physical Exam:   VS:  There were no vitals taken for this visit.   Wt Readings from Last 3 Encounters:  09/17/22  236 lb (107 kg)  03/04/22 240 lb 1.3 oz (108.9 kg)  10/23/21 240 lb (108.9 kg)    GEN: Well nourished, well developed in no acute distress NECK: No JVD; No carotid bruits CARDIAC: ***RRR, no murmurs, rubs, gallops RESPIRATORY:  Clear to auscultation without rales, wheezing or rhonchi  ABDOMEN: Soft, non-tender, non-distended EXTREMITIES:  No edema; No deformity   ASSESSMENT AND PLAN: .   ***    {Are you ordering a CV Procedure (e.g. stress test, cath, DCCV, TEE, etc)?   Press F2        :[id number]}  Dispo: ***  Signed, Jonita Albee, PA-C

## 2023-03-11 ENCOUNTER — Ambulatory Visit: Payer: Medicare Other | Attending: Cardiology | Admitting: Cardiology

## 2023-03-11 ENCOUNTER — Encounter: Payer: Self-pay | Admitting: Cardiology

## 2023-03-22 ENCOUNTER — Other Ambulatory Visit: Payer: Self-pay

## 2023-03-22 MED ORDER — PANTOPRAZOLE SODIUM 40 MG PO TBEC: DELAYED_RELEASE_TABLET | ORAL | 3 refills | Status: DC

## 2023-03-22 NOTE — Telephone Encounter (Signed)
Pt's pharmacy is requesting a refill on pantoprazole. Would Dr. Swaziland like to refill this medication? Please address

## 2023-04-01 ENCOUNTER — Other Ambulatory Visit: Payer: Self-pay

## 2023-04-01 MED ORDER — PANTOPRAZOLE SODIUM 40 MG PO TBEC: DELAYED_RELEASE_TABLET | ORAL | 1 refills | Status: DC

## 2023-04-01 NOTE — Telephone Encounter (Signed)
Per note on 03/22/23, Dr. Swaziland reorder medication, but it was on print. Reordered medication. Confirmation received.

## 2023-06-04 ENCOUNTER — Emergency Department (HOSPITAL_COMMUNITY): Payer: Medicare Other

## 2023-06-04 ENCOUNTER — Other Ambulatory Visit: Payer: Self-pay

## 2023-06-04 ENCOUNTER — Telehealth: Payer: Self-pay | Admitting: Cardiology

## 2023-06-04 ENCOUNTER — Emergency Department (HOSPITAL_COMMUNITY)
Admission: EM | Admit: 2023-06-04 | Discharge: 2023-06-04 | Discharge status: Against Medical Advice | Payer: Medicare Other | Attending: Emergency Medicine | Admitting: Emergency Medicine

## 2023-06-04 DIAGNOSIS — Z5321 Procedure and treatment not carried out due to patient leaving prior to being seen by health care provider: Secondary | ICD-10-CM | POA: Insufficient documentation

## 2023-06-04 DIAGNOSIS — R0602 Shortness of breath: Secondary | ICD-10-CM | POA: Diagnosis present

## 2023-06-04 DIAGNOSIS — I509 Heart failure, unspecified: Secondary | ICD-10-CM | POA: Insufficient documentation

## 2023-06-04 LAB — CBC
HCT: 27 % — ABNORMAL LOW (ref 39.0–52.0)
Hemoglobin: 9.2 g/dL — ABNORMAL LOW (ref 13.0–17.0)
MCH: 33.9 pg (ref 26.0–34.0)
MCHC: 34.1 g/dL (ref 30.0–36.0)
MCV: 99.6 fL (ref 80.0–100.0)
Platelets: 177 10*3/uL (ref 150–400)
RBC: 2.71 MIL/uL — ABNORMAL LOW (ref 4.22–5.81)
RDW: 14 % (ref 11.5–15.5)
WBC: 18.1 10*3/uL — ABNORMAL HIGH (ref 4.0–10.5)
nRBC: 0 % (ref 0.0–0.2)

## 2023-06-04 LAB — BASIC METABOLIC PANEL
Anion gap: 12 (ref 5–15)
BUN: 19 mg/dL (ref 8–23)
CO2: 24 mmol/L (ref 22–32)
Calcium: 8.4 mg/dL — ABNORMAL LOW (ref 8.9–10.3)
Chloride: 94 mmol/L — ABNORMAL LOW (ref 98–111)
Creatinine, Ser: 1.49 mg/dL — ABNORMAL HIGH (ref 0.61–1.24)
GFR, Estimated: 47 mL/min — ABNORMAL LOW (ref >60)
Glucose, Bld: 90 mg/dL (ref 70–99)
Potassium: 3.7 mmol/L (ref 3.5–5.1)
Sodium: 130 mmol/L — ABNORMAL LOW (ref 135–145)

## 2023-06-04 LAB — TROPONIN I (HIGH SENSITIVITY): Troponin I (High Sensitivity): 31 ng/L — ABNORMAL HIGH (ref <18)

## 2023-06-04 NOTE — ED Notes (Signed)
Pt said they are going to go somewhere else. Moved OTF.

## 2023-06-04 NOTE — Telephone Encounter (Signed)
Daughter Christella Hartigan) stated they are taking patient to [facility] with rattling in his chest.

## 2023-06-04 NOTE — ED Triage Notes (Signed)
Pt here for evaluation, states he was seen at his PCP yesterday. Called today and told he has fluid in his lungs. Hx of CHF and stent placement. Reports increased ShOB. Son reports "he has had a bad cold the past week". 86% on RA, placed on 3L [place] improved to 92%.

## 2023-06-04 NOTE — Telephone Encounter (Signed)
Advised will let the provider know.  She states they will go to [facility]

## 2023-08-28 DEATH — deceased

## 2023-09-16 ENCOUNTER — Ambulatory Visit: Payer: Medicare Other | Admitting: Cardiology
